# Patient Record
Sex: Male | Born: 1945 | Hispanic: No | Marital: Married | Smoking: Former smoker
Health system: Southern US, Community
[De-identification: ages and names within clinical notes are randomized; demographics above are authoritative.]

## PROBLEM LIST (undated history)

## (undated) DIAGNOSIS — I10 Essential (primary) hypertension: Secondary | ICD-10-CM

## (undated) DIAGNOSIS — E785 Hyperlipidemia, unspecified: Secondary | ICD-10-CM

## (undated) DIAGNOSIS — L940 Localized scleroderma [morphea]: Secondary | ICD-10-CM

## (undated) DIAGNOSIS — N289 Disorder of kidney and ureter, unspecified: Secondary | ICD-10-CM

## (undated) DIAGNOSIS — I739 Peripheral vascular disease, unspecified: Secondary | ICD-10-CM

## (undated) HISTORY — DX: Hyperlipidemia, unspecified: E78.5

---

## 2010-05-23 DIAGNOSIS — K743 Primary biliary cirrhosis: Secondary | ICD-10-CM

## 2010-05-23 HISTORY — DX: Primary biliary cirrhosis: K74.3

## 2017-06-16 ENCOUNTER — Ambulatory Visit: Payer: Self-pay | Admitting: Nurse Practitioner

## 2017-06-16 ENCOUNTER — Encounter: Payer: Self-pay | Admitting: Nurse Practitioner

## 2017-06-16 ENCOUNTER — Other Ambulatory Visit: Payer: Self-pay

## 2017-06-16 ENCOUNTER — Ambulatory Visit (INDEPENDENT_AMBULATORY_CARE_PROVIDER_SITE_OTHER): Payer: Medicare Other | Admitting: Nurse Practitioner

## 2017-06-16 VITALS — BP 136/62 | HR 66 | Temp 98.4°F | Wt 131.0 lb

## 2017-06-16 DIAGNOSIS — J410 Simple chronic bronchitis: Secondary | ICD-10-CM

## 2017-06-16 DIAGNOSIS — Z23 Encounter for immunization: Secondary | ICD-10-CM | POA: Diagnosis not present

## 2017-06-16 DIAGNOSIS — Z79899 Other long term (current) drug therapy: Secondary | ICD-10-CM

## 2017-06-16 DIAGNOSIS — Z7689 Persons encountering health services in other specified circumstances: Secondary | ICD-10-CM | POA: Diagnosis not present

## 2017-06-16 MED ORDER — ZOSTER VAC RECOMB ADJUVANTED 50 MCG/0.5ML IM SUSR: 0.5000 mL | Freq: Once | INTRAMUSCULAR | 1 refills | Status: AC

## 2017-06-16 NOTE — Assessment & Plan Note (Signed)
Pt with approx 1 year history of early morning productive cough with white sputum lasting about 1 hour.  With history of smoking, is suggestive of smoker's cough and early chronic bronchitis.  Pt with decreased air movement, but no other adventitious breath sounds today.  No respiratory distress or increased shortness of breath on exertion.  Plan: 1. Discussed inhalers, pt declines today. 2. Encouraged smoking cessation.  Pt declines additional information. 3. Followup with spirometry as needed for worsening or persistent symptoms. 4. Followup 6 months.

## 2017-06-16 NOTE — Progress Notes (Signed)
Subjective:    Patient ID: Scott Oconnor, male    DOB: [birth year], 72 y.o.   MRN: [id number]  Scott Oconnor is a 72 y.o. male presenting on 06/16/2017 for Establish Care ( coughing of phelgm mostly in morning x 1 year)   HPI Establish Care New Provider Pt last seen by PCP Dr. Phineas Douglas about 4 years ago.  Obtain records from [facility], [place], [place]  Chronic Cough Pt reports chronic productive cough with "lots of phlegm" every morning with coughing for about 1 hour.  This has occurred daily over last year and is not getting worse.  Sometimes has coughing in the evening and overnight. Production is white in color. - Pt reports no regular seasonal or environmental allergies.  He had previously lived in [place] and moved to [place] about 5-6 months ago.  Cough is unchanged with this move and his changed environmental exposures. - Pt started smoking at age [age] and smoked 1 ppd during working years until he started cutting back 2-3 years ago. He now smokes only 6 cigarettes per day and is not willing to completely quit smoking.  High risk medication: Pt reports his last visit with his doctor for labs was over 4 years ago.  He has had continued prescription of simvastatin without monitoring.  Pt has significant family history of heart disease and hyperlipidemia.  He reports he has never been diagnosed with hyperlipidemia, but takes this medication to prevent future heart attacks given family history.  Vaccinations: Pt requests shingles and pneumonia vaccines today.  Diet includes mostly meals of rice and curries (vegetables and small amounts of meat). Pt is regularly active with walking about 3 days per week.  Notes it was more frequently when he lived in [place] where it was warmer.  Past Medical History:  Diagnosis Date  . Hyperlipidemia    History reviewed. No pertinent surgical history. Social History   Socioeconomic History  . Marital status: Married    Spouse name: Not  on file  . Number of children: Not on file  . Years of education: Not on file  . Highest education level: Not on file  Social Needs  . Financial resource strain: Not on file  . Food insecurity - worry: Not on file  . Food insecurity - inability: Not on file  . Transportation needs - medical: Not on file  . Transportation needs - non-medical: Not on file  Occupational History  . Not on file  Tobacco Use  . Smoking status: Current Every Day Smoker    Packs/day: 0.33    Types: Cigarettes  . Smokeless tobacco: Never Used  Substance and Sexual Activity  . Alcohol use: No    Frequency: Never  . Drug use: No  . Sexual activity: Yes  Other Topics Concern  . Not on file  Social History Narrative  . Not on file   Family History  Problem Relation Age of Onset  . Heart disease Father   . Sudden Cardiac Death Father   . Heart disease Brother   . Hyperlipidemia Brother   . Heart disease Sister    Current Outpatient Medications on File Prior to Visit  Medication Sig  . aspirin EC 81 MG tablet Take 81 mg by mouth daily.  . simvastatin (ZOCOR) 10 MG tablet Take 10 mg by mouth daily.   No current facility-administered medications on file prior to visit.     Review of Systems  Constitutional: Negative.   HENT: Negative.  Eyes: Negative.   Respiratory: Positive for cough.   Cardiovascular:       Pt notes cold toes on R foot  Gastrointestinal: Negative.   Endocrine: Negative.   Genitourinary: Negative.   Musculoskeletal: Negative.   Skin: Negative.   Allergic/Immunologic: Negative.   Neurological: Negative.   Hematological: Negative.   Psychiatric/Behavioral: Negative.    Per HPI unless specifically indicated above     Objective:    BP 136/62 (BP Location: Right Arm, Patient Position: Sitting, Cuff Size: Normal)   Pulse 66   Temp 98.4 F (36.9 C) (Oral)   Wt 131 lb (59.4 kg)   Wt Readings from Last 3 Encounters:  06/16/17 131 lb (59.4 kg)    Physical Exam    Constitutional: He is oriented to person, place, and time. He appears well-developed and well-nourished. No distress.  HENT:  Head: Normocephalic and atraumatic.  Right Ear: Tympanic membrane, external ear and ear canal normal.  Left Ear: Tympanic membrane, external ear and ear canal normal.  Nose: Nose normal. Right sinus exhibits no maxillary sinus tenderness and no frontal sinus tenderness. Left sinus exhibits no maxillary sinus tenderness and no frontal sinus tenderness.  Mouth/Throat: Oropharynx is clear and moist.  Eyes: Conjunctivae and EOM are normal. Pupils are equal, round, and reactive to light.  Neck: Normal range of motion. Neck supple. No JVD present. Carotid bruit is not present. No tracheal deviation present. No thyromegaly present.  Cardiovascular: Normal rate, regular rhythm, S1 normal, S2 normal and normal heart sounds.  Pulses:      Radial pulses are 2+ on the right side, and 2+ on the left side.       Dorsalis pedis pulses are 0 on the right side, and 1+ on the left side.       Posterior tibial pulses are 1+ on the right side, and 1+ on the left side.  Toes on R foot are cool to touch, cap refill 3 seconds  Pulmonary/Chest: Effort normal. No respiratory distress. He has decreased breath sounds. He has no wheezes. He has no rhonchi. He has no rales.  Abdominal: Soft. Bowel sounds are normal.  Lymphadenopathy:    He has no cervical adenopathy.  Neurological: He is alert and oriented to person, place, and time.  Skin: Skin is warm and dry.  Psychiatric: He has a normal mood and affect. His behavior is normal. Judgment and thought content normal.  Vitals reviewed.    No results found for this or any previous visit.    Assessment & Plan:   Problem List Items Addressed This Visit      Respiratory   Smokers' cough ([place]) - Primary    Pt with approx 1 year history of early morning productive cough with white sputum lasting about 1 hour.  With history of smoking, is  suggestive of smoker's cough and early chronic bronchitis.  Pt with decreased air movement, but no other adventitious breath sounds today.  No respiratory distress or increased shortness of breath on exertion.  Plan: 1. Discussed inhalers, pt declines today. 2. Encouraged smoking cessation.  Pt declines additional information. 3. Followup with spirometry as needed for worsening or persistent symptoms. 4. Followup 6 months.       Other Visit Diagnoses    Long-term use of high-risk medication     Pt taking simvastatin 10 mg once daily without reassessment of labs.  Pt tolerating the medication well.  Plan: 1. CMP and lipid panel. 2. Continue medications for ASCVD prevention  given family and smoking histories if labs return WNL or require additional lipid management. 3. Followup at least once every 6 months.   Relevant Orders   Comprehensive metabolic panel   Lipid panel   Need for shingles vaccine     Pt requests vaccine.  Order placed for pharmacy administration of Shingrix 2-dose series.   Relevant Medications   Zoster Vaccine Adjuvanted [facility]) injection   Need for vaccination against Streptococcus pneumoniae using pneumococcal conjugate vaccine 13     Pt requests pneumonia vaccination.  None previously administered.  Give Prevnar 13.  Pt declines today, but agrees to have vaccine administered when he returns for labs next Tuesday.    Encounter to establish care     Previous PCP was in [place], [place].  Records will not be requested as care was > 4 years ago.  Past medical, family, and surgical history reviewed w/ pt in clinic today.      Meds ordered this encounter  Medications  . Zoster Vaccine Adjuvanted [facility]) injection    Sig: Inject 0.5 mLs into the muscle once for 1 dose. Repeat dose in 2-6 months.    Dispense:  0.5 mL    Refill:  1    Order Specific Question:   Supervising Provider    Answer:   Olin Hauser [[id number]]    Follow up plan: Return in  about 2 weeks (around 06/30/2017) for Medicare Wellness with [place] 6 months for hyperlipidemia.  Cassell Smiles, DNP, AGPCNP-BC Adult Gerontology Primary Care Nurse Practitioner [facility] 06/16/2017, 4:49 PM

## 2017-06-16 NOTE — Patient Instructions (Addendum)
Hussam, Thank you for coming in to clinic today.  1. Your cough is most likely early chronic bronchitis or COPD.  If this worsens, let me know and we can start medications.  2.  Continue or simvastatin and aspirin 81 mg once daily  3. You will be due for [place].  This means you should eat no food or drink after midnight.  Drink only water or coffee without cream/sugar on the morning of your lab visit. - Please go ahead and schedule a "Lab Only" visit in the morning at the clinic for lab draw in the next 7 days. - Your results will be available about 2-3 days after blood draw.  If you have set up a MyChart account, you can can log in to MyChart online to view your results and a brief explanation. Also, we can discuss your results together at your next office visit if you would like.  4. You should get Prevnar 13 pneumonia vaccine the same day you return for labs.  Please schedule a follow-up appointment with Cassell Smiles, AGNP. Return in about 2 weeks (around 06/30/2017) for Medicare Wellness with [facility] 6 months for hyperlipidemia.  If you have any other questions or concerns, please feel free to call the clinic or send a message through [place]. You may also schedule an earlier appointment if necessary.  You will receive a survey after today's visit either digitally by e-mail or paper by [employer]. Your experiences and feedback matter to [place].  Please respond so we know how we are doing as we provide care for you.   Cassell Smiles, DNP, AGNP-BC Adult Gerontology Nurse Practitioner [place]

## 2017-06-23 LAB — COMPREHENSIVE METABOLIC PANEL
AG Ratio: 1.4 (calc) (ref 1.0–2.5)
ALT: 14 U/L (ref 9–46)
AST: 16 U/L (ref 10–35)
Albumin: 3.9 g/dL (ref 3.6–5.1)
Alkaline phosphatase (APISO): 86 U/L (ref 40–115)
BUN/Creatinine Ratio: 17 (calc) (ref 6–22)
BUN: 21 mg/dL (ref 7–25)
CO2: 30 mmol/L (ref 20–32)
Calcium: 9 mg/dL (ref 8.6–10.3)
Chloride: 104 mmol/L (ref 98–110)
Creat: 1.27 mg/dL — ABNORMAL HIGH (ref 0.70–1.18)
Globulin: 2.7 g/dL (calc) (ref 1.9–3.7)
Glucose, Bld: 119 mg/dL — ABNORMAL HIGH (ref 65–99)
Potassium: 3.7 mmol/L (ref 3.5–5.3)
Sodium: 140 mmol/L (ref 135–146)
Total Bilirubin: 0.5 mg/dL (ref 0.2–1.2)
Total Protein: 6.6 g/dL (ref 6.1–8.1)

## 2017-06-23 LAB — LIPID PANEL
Cholesterol: 136 mg/dL (ref <200)
HDL: 49 mg/dL (ref >40)
LDL Cholesterol (Calc): 70 mg/dL (calc)
Non-HDL Cholesterol (Calc): 87 mg/dL (calc) (ref <130)
Total CHOL/HDL Ratio: 2.8 (calc) (ref <5.0)
Triglycerides: 86 mg/dL (ref <150)

## 2017-06-27 ENCOUNTER — Ambulatory Visit (INDEPENDENT_AMBULATORY_CARE_PROVIDER_SITE_OTHER): Payer: Medicare Other

## 2017-06-27 VITALS — BP 140/80 | HR 62 | Temp 98.1°F | Resp 16 | Ht 70.0 in | Wt 130.0 lb

## 2017-06-27 DIAGNOSIS — Z23 Encounter for immunization: Secondary | ICD-10-CM

## 2017-06-27 DIAGNOSIS — Z0000 Encounter for general adult medical examination without abnormal findings: Secondary | ICD-10-CM

## 2017-06-27 NOTE — Progress Notes (Signed)
Subjective:   Scott Oconnor is a 72 y.o. male who presents for an Initial Medicare Annual Wellness Visit.  Review of Systems   Cardiac Risk Factors include: advanced age (>60men, >38 women);male gender;smoking/ tobacco exposure    Objective:    Today's Vitals   06/27/17 1027  BP: 140/80  Pulse: 62  Resp: 16  Temp: 98.1 F (36.7 C)  TempSrc: Oral  Weight: 130 lb (59 kg)  Height: 5\' 10"  (1.778 m)   Body mass index is 18.65 kg/m.  Advanced Directives 06/27/2017  Does Patient Have a Medical Advance Directive? No  Would patient like information on creating a medical advance directive? Yes (MAU/Ambulatory/Procedural Areas - Information given)    Current Medications (verified) Outpatient Encounter Medications as of 06/27/2017  Medication Sig  . aspirin EC 81 MG tablet Take 81 mg by mouth daily.  . Multiple Vitamin (MULTIVITAMIN) capsule Take 1 capsule by mouth daily.  . simvastatin (ZOCOR) 10 MG tablet Take 10 mg by mouth daily.   No facility-administered encounter medications on file as of 06/27/2017.     Allergies (verified) Patient has no known allergies.   History: Past Medical History:  Diagnosis Date  . Hyperlipidemia    History reviewed. No pertinent surgical history. Family History  Problem Relation Age of Onset  . Heart disease Father   . Sudden Cardiac Death Father   . Heart disease Brother   . Hyperlipidemia Brother   . Heart disease Sister   . Heart disease Brother   . Hyperlipidemia Brother   . Hyperlipidemia Brother   . Hyperlipidemia Brother   . Hyperlipidemia Brother   . Hyperlipidemia Brother   . Heart disease Sister    Social History   Socioeconomic History  . Marital status: Married    Spouse name: None  . Number of children: None  . Years of education: None  . Highest education level: None  Social Needs  . Financial resource strain: Not hard at all  . Food insecurity - worry: Never true  . Food insecurity - inability: Never true   . Transportation needs - medical: No  . Transportation needs - non-medical: No  Occupational History  . None  Tobacco Use  . Smoking status: Current Every Day Smoker    Packs/day: 0.33    Types: Cigarettes  . Smokeless tobacco: Never Used  . Tobacco comment: 6 cigarettes daily   Substance and Sexual Activity  . Alcohol use: No    Frequency: Never  . Drug use: No  . Sexual activity: Yes  Other Topics Concern  . None  Social History Narrative  . None   Tobacco Counseling Ready to quit: No Counseling given: Yes Comment: 6 cigarettes daily    Clinical Intake:  Pre-visit preparation completed: Yes  Pain : No/denies pain     Nutritional Status: BMI <19  Underweight Nutritional Risks: None Diabetes: No  How often do you need to have someone help you when you read instructions, pamphlets, or other written materials from your doctor or pharmacy?: 1 - Never What is the last grade level you completed in school?: high school   Interpreter Needed?: No  Information entered by :: Tiffany Hill,LPN  Activities of Daily Living In your present state of health, do you have any difficulty performing the following activities: 06/27/2017 06/16/2017  Hearing? N N  Vision? N Y  Difficulty concentrating or making decisions? N N  Walking or climbing stairs? N N  Dressing or bathing? N N  Doing  errands, shopping? N N  Preparing Food and eating ? N -  Using the Toilet? N -  In the past six months, have you accidently leaked urine? N -  Do you have problems with loss of bowel control? N -  Managing your Medications? N -  Managing your Finances? N -  Housekeeping or managing your Housekeeping? N -     Immunizations and Health Maintenance Immunization History  Administered Date(s) Administered  . Pneumococcal Conjugate-13 06/27/2017   Health Maintenance Due  Topic Date Due  . TETANUS/TDAP  09/01/1965  . COLONOSCOPY  09/01/1996  . INFLUENZA VACCINE  12/21/2016    Patient Care  Team: Mikey College, NP as PCP - General (Nurse Practitioner)  Indicate any recent Medical Services you may have received from other than [facility] providers in the past year (date may be approximate).    Assessment:   This is a routine wellness examination for Scott Oconnor.  Hearing/Vision screen Vision Screening Comments: Doesn't have an eye doctor in the area.   Dietary issues and exercise activities discussed: Current Exercise Habits: The patient does not participate in regular exercise at present, Exercise limited by: None identified  Goals    . Quit Smoking     Smoking cessation discussed      Depression Screen PHQ 2/9 Scores 06/27/2017 06/16/2017  PHQ - 2 Score 0 0    Fall Risk Fall Risk  06/27/2017 06/16/2017  Falls in the past year? No No    Is the patient's home free of loose throw rugs in walkways, pet beds, electrical cords, etc?   yes      Grab bars in the bathroom? no      Handrails on the stairs?   no      Adequate lighting?   no  Timed Get Up and Go performed: Completed in 9 seconds with no use of assistive devices, steady gait. No intervention needed at this time.   Cognitive Function:     6CIT Screen 06/27/2017  What Year? 0 points  What month? 0 points  What time? 0 points  Count back from 20 0 points  Months in reverse 0 points  Repeat phrase 2 points  Total Score 2    Screening Tests Health Maintenance  Topic Date Due  . TETANUS/TDAP  09/01/1965  . COLONOSCOPY  09/01/1996  . INFLUENZA VACCINE  12/21/2016  . Hepatitis C Screening  06/27/2018 (Originally December 16, 1945)  . PNA vac Low Risk Adult (2 of 2 - PPSV23) 06/27/2018    Qualifies for Shingles Vaccine? Discussed shingrix vaccine   Cancer Screenings: Lung: Low Dose CT Chest recommended if Age 18-80 years, 30 pack-year currently smoking OR have quit w/in 15years. Patient does not qualify. Colorectal: patient has cologuard information, he will call if he decides to have colonoscopy or cologuard  done  Additional Screenings:  Hepatitis B/HIV/Syphillis: not indicated Hepatitis C Screening: not indicated      Plan:    I have personally reviewed and addressed the Medicare Annual Wellness questionnaire and have noted the following in the patient's chart:  A. Medical and social history B. Use of alcohol, tobacco or illicit drugs  C. Current medications and supplements D. Functional ability and status E.  Nutritional status F.  Physical activity G. Advance directives H. List of other physicians I.  Hospitalizations, surgeries, and ER visits in previous 12 months J.  [place] such as hearing and vision if needed, cognitive and depression L. Referrals and appointments  In addition, I have reviewed and discussed with patient certain preventive protocols, quality metrics, and best practice recommendations. A written personalized care plan for preventive services as well as general preventive health recommendations were provided to patient.   Signed,  Tyler Aas, LPN Nurse Health Advisor   Nurse Notes:none

## 2017-06-27 NOTE — Patient Instructions (Addendum)
Scott Oconnor , Thank you for taking time to come for your Medicare Wellness Visit. I appreciate your ongoing commitment to your health goals. Please review the following plan we discussed and let me know if I can assist you in the future.   Screening recommendations/referrals: Colonoscopy: declined at this time. cologuard information given.  Recommended yearly ophthalmology/optometry visit for glaucoma screening and checkup Recommended yearly dental visit for hygiene and checkup  Vaccinations: Influenza vaccine: declined today  Pneumococcal vaccine: prevnar 13 done today, pneumovax 23 due 06/27/2018 Tdap vaccine: due, check with your insurance company for coverage  Shingles vaccine: due, check with your insurance for coverage. Available at pharmacy    Advanced directives: Advance directive discussed with you today. I have provided a copy for you to complete at home and have notarized. Once this is complete please bring a copy in to our office so we can scan it into your chart.  Conditions/risks identified: smoking cessation discussed  Next appointment: Follow up on 11/15/2017 at 11:00am with Lissa Merlin. Follow up in one year for your annual wellness exam.  Preventive Care 65 Years and Older, Male Preventive care refers to lifestyle choices and visits with your health care provider that can promote health and wellness. What does preventive care include?  A yearly physical exam. This is also called an annual well check.  Dental exams once or twice a year.  Routine eye exams. Ask your health care provider how often you should have your eyes checked.  Personal lifestyle choices, including:  Daily care of your teeth and gums.  Regular physical activity.  Eating a healthy diet.  Avoiding tobacco and drug use.  Limiting alcohol use.  Practicing safe sex.  Taking low doses of aspirin every day.  Taking vitamin and mineral supplements as recommended by your health care  provider. What happens during an annual well check? The services and screenings done by your health care provider during your annual well check will depend on your age, overall health, lifestyle risk factors, and family history of disease. Counseling  Your health care provider may ask you questions about your:  Alcohol use.  Tobacco use.  Drug use.  Emotional well-being.  Home and relationship well-being.  Sexual activity.  Eating habits.  History of falls.  Memory and ability to understand (cognition).  Work and work Statistician. Screening  You may have the following tests or measurements:  Height, weight, and BMI.  Blood pressure.  Lipid and cholesterol levels. These may be checked every 5 years, or more frequently if you are over 60 years old.  Skin check.  Lung cancer screening. You may have this screening every year starting at age 70 if you have a 30-pack-year history of smoking and currently smoke or have quit within the past 15 years.  Fecal occult blood test (FOBT) of the stool. You may have this test every year starting at age 35.  Flexible sigmoidoscopy or colonoscopy. You may have a sigmoidoscopy every 5 years or a colonoscopy every 10 years starting at age 31.  Prostate cancer screening. Recommendations will vary depending on your family history and other risks.  Hepatitis C blood test.  Hepatitis B blood test.  Sexually transmitted disease (STD) testing.  Diabetes screening. This is done by checking your blood sugar (glucose) after you have not eaten for a while (fasting). You may have this done every 1-3 years.  Abdominal aortic aneurysm (AAA) screening. You may need this if you are a current or former smoker.  Osteoporosis. You may be screened starting at age 15 if you are at high risk. Talk with your health care provider about your test results, treatment options, and if necessary, the need for more tests. Vaccines  Your health care provider  may recommend certain vaccines, such as:  Influenza vaccine. This is recommended every year.  Tetanus, diphtheria, and acellular pertussis (Tdap, Td) vaccine. You may need a Td booster every 10 years.  Zoster vaccine. You may need this after age 76.  Pneumococcal 13-valent conjugate (PCV13) vaccine. One dose is recommended after age 39.  Pneumococcal polysaccharide (PPSV23) vaccine. One dose is recommended after age 66. Talk to your health care provider about which screenings and vaccines you need and how often you need them. This information is not intended to replace advice given to you by your health care provider. Make sure you discuss any questions you have with your health care provider. Document Released: 06/05/2015 Document Revised: 01/27/2016 Document Reviewed: 03/10/2015 Elsevier Interactive Patient Education  2017 [place] Prevention in the Home Falls can cause injuries. They can happen to people of all ages. There are many things you can do to make your home safe and to help prevent falls. What can I do on the outside of my home?  Regularly fix the edges of walkways and driveways and fix any cracks.  Remove anything that might make you trip as you walk through a door, such as a raised step or threshold.  Trim any bushes or trees on the path to your home.  Use bright outdoor lighting.  Clear any walking paths of anything that might make someone trip, such as rocks or tools.  Regularly check to see if handrails are loose or broken. Make sure that both sides of any steps have handrails.  Any raised decks and porches should have guardrails on the edges.  Have any leaves, snow, or ice cleared regularly.  Use sand or salt on walking paths during winter.  Clean up any spills in your garage right away. This includes oil or grease spills. What can I do in the bathroom?  Use night lights.  Install grab bars by the toilet and in the tub and shower. Do not use  towel bars as grab bars.  Use non-skid mats or decals in the tub or shower.  If you need to sit down in the shower, use a plastic, non-slip stool.  Keep the floor dry. Clean up any water that spills on the floor as soon as it happens.  Remove soap buildup in the tub or shower regularly.  Attach bath mats securely with double-sided non-slip rug tape.  Do not have throw rugs and other things on the floor that can make you trip. What can I do in the bedroom?  Use night lights.  Make sure that you have a light by your bed that is easy to reach.  Do not use any sheets or blankets that are too big for your bed. They should not hang down onto the floor.  Have a firm chair that has side arms. You can use this for support while you get dressed.  Do not have throw rugs and other things on the floor that can make you trip. What can I do in the kitchen?  Clean up any spills right away.  Avoid walking on wet floors.  Keep items that you use a lot in easy-to-reach places.  If you need to reach something above you, use a strong step  stool that has a grab bar.  Keep electrical cords out of the way.  Do not use floor polish or wax that makes floors slippery. If you must use wax, use non-skid floor wax.  Do not have throw rugs and other things on the floor that can make you trip. What can I do with my stairs?  Do not leave any items on the stairs.  Make sure that there are handrails on both sides of the stairs and use them. Fix handrails that are broken or loose. Make sure that handrails are as long as the stairways.  Check any carpeting to make sure that it is firmly attached to the stairs. Fix any carpet that is loose or worn.  Avoid having throw rugs at the top or bottom of the stairs. If you do have throw rugs, attach them to the floor with carpet tape.  Make sure that you have a light switch at the top of the stairs and the bottom of the stairs. If you do not have them, ask  someone to add them for you. What else can I do to help prevent falls?  Wear shoes that:  Do not have high heels.  Have rubber bottoms.  Are comfortable and fit you well.  Are closed at the toe. Do not wear sandals.  If you use a stepladder:  Make sure that it is fully opened. Do not climb a closed stepladder.  Make sure that both sides of the stepladder are locked into place.  Ask someone to hold it for you, if possible.  Clearly mark and make sure that you can see:  Any grab bars or handrails.  First and last steps.  Where the edge of each step is.  Use tools that help you move around (mobility aids) if they are needed. These include:  Canes.  Walkers.  Scooters.  Crutches.  Turn on the lights when you go into a dark area. Replace any light bulbs as soon as they burn out.  Set up your furniture so you have a clear path. Avoid moving your furniture around.  If any of your floors are uneven, fix them.  If there are any pets around you, be aware of where they are.  Review your medicines with your doctor. Some medicines can make you feel dizzy. This can increase your chance of falling. Ask your doctor what other things that you can do to help prevent falls. This information is not intended to replace advice given to you by your health care provider. Make sure you discuss any questions you have with your health care provider. Document Released: 03/05/2009 Document Revised: 10/15/2015 Document Reviewed: 06/13/2014 Elsevier Interactive Patient Education  2017 [place] provider would like to you have your annual eye exam. Please contact your current eye doctor or here are some good options for you to contact.   [facility] Address: [address] [place], [place] [address]   Address: [address] [place], [place] [address]  Phone: [phone]      Phone: [phone]  Website: [email or web address]    Website:  [email or web address]     [facility]  Address: [address] [place], [place], [place] [address]   Address: [place], [place],  [address] Phone: [phone]      Phone: [phone]    [facility] Address:  [address] [place], [place] [address]  Phone: [phone]   Pneumococcal Conjugate Vaccine (PCV13) What You Need to Know 1. Why get vaccinated? Vaccination can protect both children and adults from pneumococcal disease. Pneumococcal disease is caused by bacteria that can spread from person to person through close contact. It can cause ear infections, and it can also lead to more serious infections of the:  Lungs (pneumonia),  Blood (bacteremia), and  Covering of the brain and spinal cord (meningitis).  Pneumococcal pneumonia is most common among adults. Pneumococcal meningitis can cause deafness and brain damage, and it kills about 1 child in 10 who get it. Anyone can get pneumococcal disease, but children under 30 years of age and adults 46 years and older, people with certain medical conditions, and cigarette smokers are at the highest risk. Before there was a vaccine, the [place] saw:  more than 700 cases of meningitis,  about 13,000 blood infections,  about 5 million ear infections, and  about 200 deaths  in children under 5 each year from pneumococcal disease. Since vaccine became available, severe pneumococcal disease in these children has fallen by 88%. About 18,000 older adults die of pneumococcal disease each year in the [place]. Treatment of pneumococcal infections with penicillin and other drugs is not as effective as it used to be, because some strains of the disease have become resistant to these drugs. This makes prevention of the disease, through vaccination, even more important. 2. PCV13 vaccine Pneumococcal conjugate vaccine (called PCV13) protects against 13 types of pneumococcal bacteria. PCV13  is routinely given to children at 2, 4, 6, and 24-75 months of age. It is also recommended for children and adults 36 to 58 years of age with certain health conditions, and for all adults 66 years of age and older. Your doctor can give you details. 3. Some people should not get this vaccine Anyone who has ever had a life-threatening allergic reaction to a dose of this vaccine, to an earlier pneumococcal vaccine called PCV7, or to any vaccine containing diphtheria toxoid (for example, DTaP), should not get PCV13. Anyone with a severe allergy to any component of PCV13 should not get the vaccine. Tell your doctor if the person being vaccinated has any severe allergies. If the person scheduled for vaccination is not feeling well, your healthcare provider might decide to reschedule the shot on another day. 4. Risks of a vaccine reaction With any medicine, including vaccines, there is a chance of reactions. These are usually mild and go away on their own, but serious reactions are also possible. Problems reported following PCV13 varied by age and dose in the series. The most common problems reported among children were:  About half became drowsy after the shot, had a temporary loss of appetite, or had redness or tenderness where the shot was given.  About 1 out of 3 had swelling where the shot was given.  About 1 out of 3 had a mild fever, and about 1 in 20 had a fever over 102.53F.  Up to about 8 out of 10 became fussy or irritable.  Adults have reported pain, redness, and swelling where the shot was given; also mild fever, fatigue, headache, chills, or muscle pain. Young children who get PCV13 along with inactivated flu vaccine at the same time may be at increased risk for seizures caused by fever. Ask your doctor for more information. Problems that could happen after any vaccine:  People sometimes faint after a medical procedure,  including vaccination. Sitting or lying down for about 15 minutes can  help prevent fainting, and injuries caused by a fall. Tell your doctor if you feel dizzy, or have vision changes or ringing in the ears.  Some older children and adults get severe pain in the shoulder and have difficulty moving the arm where a shot was given. This happens very rarely.  Any medication can cause a severe allergic reaction. Such reactions from a vaccine are very rare, estimated at about 1 in a million doses, and would happen within a few minutes to a few hours after the vaccination. As with any medicine, there is a very small chance of a vaccine causing a serious injury or death. The safety of vaccines is always being monitored. For more information, visit: http://www.aguilar.org/ 5. What if there is a serious reaction? What should I look for? Look for anything that concerns you, such as signs of a severe allergic reaction, very high fever, or unusual behavior. Signs of a severe allergic reaction can include hives, swelling of the face and throat, difficulty breathing, a fast heartbeat, dizziness, and weakness-usually within a few minutes to a few hours after the vaccination. What should I do?  If you think it is a severe allergic reaction or other emergency that can't wait, call 9-1-1 or get the person to the nearest hospital. Otherwise, call your doctor.  Reactions should be reported to the Vaccine Adverse Event Reporting System (VAERS). Your doctor should file this report, or you can do it yourself through the VAERS web site at www.vaers.SamedayNews.es, or by calling 206-339-5113. ? VAERS does not give medical advice. 6. The National Vaccine Injury Compensation Program The [employer] Vaccine Injury Compensation Program (VICP) is a federal program that was created to compensate people who may have been injured by certain vaccines. Persons who believe they may have been injured by a vaccine can learn about the program and about filing a claim by calling (706) 587-6297 or visiting the  [place] website at GoldCloset.com.ee. There is a time limit to file a claim for compensation. 7. How can I learn more?  Ask your healthcare provider. He or she can give you the vaccine package insert or suggest other sources of information.  Call your local or state health department.  Contact the Centers for Disease Control and Prevention (CDC): ? Call (606) 221-6142 (1-800-CDC-INFO) or ? Visit CDC's website at http://hunter.com/ Vaccine Information Statement, PCV13 Vaccine (03/27/2014) This information is not intended to replace advice given to you by your health care provider. Make sure you discuss any questions you have with your health care provider. Document Released: 03/06/2006 Document Revised: 01/28/2016 Document Reviewed: 01/28/2016 Elsevier Interactive Patient Education  2017 [employer].

## 2017-07-26 ENCOUNTER — Ambulatory Visit: Payer: Self-pay | Admitting: Nurse Practitioner

## 2017-08-01 ENCOUNTER — Emergency Department
Admission: EM | Admit: 2017-08-01 | Discharge: 2017-08-01 | Disposition: A | Discharge status: Home / Self Care | Payer: Medicare Other | Attending: Emergency Medicine | Admitting: Emergency Medicine

## 2017-08-01 ENCOUNTER — Encounter: Payer: Self-pay | Admitting: Emergency Medicine

## 2017-08-01 ENCOUNTER — Other Ambulatory Visit: Payer: Self-pay

## 2017-08-01 DIAGNOSIS — W260XXA Contact with knife, initial encounter: Secondary | ICD-10-CM | POA: Insufficient documentation

## 2017-08-01 DIAGNOSIS — Y999 Unspecified external cause status: Secondary | ICD-10-CM | POA: Insufficient documentation

## 2017-08-01 DIAGNOSIS — Z7982 Long term (current) use of aspirin: Secondary | ICD-10-CM | POA: Insufficient documentation

## 2017-08-01 DIAGNOSIS — Z23 Encounter for immunization: Secondary | ICD-10-CM | POA: Diagnosis not present

## 2017-08-01 DIAGNOSIS — Y929 Unspecified place or not applicable: Secondary | ICD-10-CM | POA: Diagnosis not present

## 2017-08-01 DIAGNOSIS — B351 Tinea unguium: Secondary | ICD-10-CM | POA: Insufficient documentation

## 2017-08-01 DIAGNOSIS — S61012A Laceration without foreign body of left thumb without damage to nail, initial encounter: Secondary | ICD-10-CM | POA: Diagnosis present

## 2017-08-01 DIAGNOSIS — F17210 Nicotine dependence, cigarettes, uncomplicated: Secondary | ICD-10-CM | POA: Diagnosis not present

## 2017-08-01 DIAGNOSIS — M79676 Pain in unspecified toe(s): Secondary | ICD-10-CM | POA: Insufficient documentation

## 2017-08-01 DIAGNOSIS — Y93G1 Activity, food preparation and clean up: Secondary | ICD-10-CM | POA: Diagnosis not present

## 2017-08-01 MED ORDER — TETANUS-DIPHTH-ACELL PERTUSSIS 5-2.5-18.5 LF-MCG/0.5 IM SUSP
INTRAMUSCULAR | Status: AC
  Filled 2017-08-01: qty 0.5

## 2017-08-01 MED ORDER — TETANUS-DIPHTH-ACELL PERTUSSIS 5-2.5-18.5 LF-MCG/0.5 IM SUSP
0.5000 mL | Freq: Once | INTRAMUSCULAR | Status: AC
  Administered 2017-08-01: 0.5 mL via INTRAMUSCULAR

## 2017-08-01 MED ORDER — LIDOCAINE HCL (PF) 1 % IJ SOLN
5.0000 mL | Freq: Once | INTRAMUSCULAR | Status: AC
Start: 2017-08-01 — End: 2017-08-01
  Administered 2017-08-01: 5 mL
  Filled 2017-08-01: qty 5

## 2017-08-01 NOTE — Discharge Instructions (Signed)
Keep the wound clean, dry, and covered. Follow-up with a local urgent care center for suture removal in 10-12 days. See Dr. Vickki Muff for further management of the nail fungus and toe pain.

## 2017-08-01 NOTE — ED Provider Notes (Signed)
[facility] Emergency Department Provider Note ____________________________________________  Time seen: 2132  I have reviewed the triage vital signs and the nursing notes.  HISTORY  Chief Complaint  Laceration  HPI Scott Oconnor is a 72 y.o. male presents to the ED accompanied by his adult daughter, for treatment of a laceration to the left thumb. He was trying to cut frozen chicken cutlets apart. The knife slipped, cutting him on the palmar aspect of the proximal phalanx. He reports an out-of-date tetanus status.   He has an unrelated concern for evaluation of bilateral great toe pain. He denies any injury, trauma, or redness. The pain is worse with shoes and is localized to the free edge of the nail at the toes.   Past Medical History:  Diagnosis Date  . Hyperlipidemia     Patient Active Problem List   Diagnosis Date Noted  . Smokers' cough ([place]) 06/16/2017    History reviewed. No pertinent surgical history.  Prior to Admission medications   Medication Sig Start Date End Date Taking? Authorizing Provider  aspirin EC 81 MG tablet Take 81 mg by mouth daily.    [provider]  Multiple Vitamin (MULTIVITAMIN) capsule Take 1 capsule by mouth daily.    [provider]  simvastatin (ZOCOR) 10 MG tablet Take 10 mg by mouth daily.    [provider]    Allergies Patient has no known allergies.  Family History  Problem Relation Age of Onset  . Heart disease Father   . Sudden Cardiac Death Father   . Heart disease Brother   . Hyperlipidemia Brother   . Heart disease Sister   . Heart disease Brother   . Hyperlipidemia Brother   . Hyperlipidemia Brother   . Hyperlipidemia Brother   . Hyperlipidemia Brother   . Hyperlipidemia Brother   . Heart disease Sister     Social History Social History   Tobacco Use  . Smoking status: Current Every Day Smoker    Packs/day: 0.33    Types: Cigarettes  . Smokeless tobacco:  Never Used  . Tobacco comment: 6 cigarettes daily   Substance Use Topics  . Alcohol use: No    Frequency: Never  . Drug use: No    Review of Systems  Constitutional: Negative for fever. Cardiovascular: Negative for chest pain. Respiratory: Negative for shortness of breath. Musculoskeletal: Negative for back pain. Skin: Negative for rash. Left thumb laceration. Bilateral great toe pain.  Neurological: Negative for headaches, focal weakness or numbness. ____________________________________________  PHYSICAL EXAM:  VITAL SIGNS: ED Triage Vitals [08/01/17 2042]  Enc Vitals Group     BP 130/63     Pulse Rate (!) 59     Resp 18     Temp 97.8 F (36.6 C)     Temp Source Oral     SpO2 100 %     Weight 130 lb (59 kg)     Height 5\' 11"  (1.803 m)     Head Circumference      Peak Flow      Pain Score 8     Pain Loc      Pain Edu?      Excl. in [place]?     Constitutional: Alert and oriented. Well appearing and in no distress. Head: Normocephalic and atraumatic. Cardiovascular: Normal rate, regular rhythm. Normal distal pulses. Respiratory: Normal respiratory effort.  Musculoskeletal: Normal composite fist. Left thumb with a 2 cm linear laceration to the proximal phalanx. Nontender with normal range  of motion in all extremities.  Neurologic:  Normal gross sensation. No gross focal neurologic deficits are appreciated. Skin:  Skin is warm, dry and intact. No rash noted. Patient with onychomycosis of the bilateral great toenails. The right nail with onycholysis of the free edge. There is some local tenderness to the lateral edges of the distal nails, right > left toe. No paronychia or ingrown nail edges noted.  ____________________________________________  PROCEDURES Tdap 0.5 ml IM   .Marland KitchenLaceration Repair Date/Time: 08/01/2017 10:41 PM Performed by: Melvenia Needles, PA-C Authorized by: Melvenia Needles, PA-C   Consent:    Consent obtained:  Verbal   Consent given by:   Patient   Risks discussed:  Poor wound healing Anesthesia (see MAR for exact dosages):    Anesthesia method:  Local infiltration   Local anesthetic:  Lidocaine 1% w/o epi Laceration details:    Location:  Finger   Finger location:  L thumb   Length (cm):  2 Repair type:    Repair type:  Simple Pre-procedure details:    Preparation:  Patient was prepped and draped in usual sterile fashion Treatment:    Area cleansed with:  Betadine   Amount of cleaning:  Standard Skin repair:    Repair method:  Sutures   Suture size:  4-0   Suture material:  Nylon   Suture technique:  Simple interrupted   Number of sutures:  3 Approximation:    Approximation:  Close   V[place] border: well-aligned   Post-procedure details:    Dressing:  Non-adherent dressing   Patient tolerance of procedure:  Tolerated well, no immediate complications  ___________________________________________  INITIAL IMPRESSION / ASSESSMENT AND PLAN / ED COURSE  Patient with ED evaluation of an acute left thumb laceration. The wound is repaired with sutures. He will see a local urgent care for suture removal in 10-12 days. He is also referred to podiatry to further management of his onychomycosis.  ____________________________________________  FINAL CLINICAL IMPRESSION(S) / ED DIAGNOSES  Final diagnoses:  Laceration of left thumb without foreign body without damage to nail, initial encounter  Pain due to onychomycosis of toenail      Orvan Papadakis, Dannielle Karvonen, PA-C 08/01/17 2246    Harvest Dark, MD 08/02/17 0003

## 2017-08-01 NOTE — ED Notes (Addendum)
Pt has a laceration to the left hand. Laceration is proximal to the thumb on the outside of hand around 1" in length. Unable to assess depth at this time due to bandage. Pt has who butterfly stitches placed over the wound. daughter states the laceration seemed deep to her and created "a pocket". Wound is clean and currently not bleeding

## 2017-08-01 NOTE — ED Notes (Signed)
Pt also would like PA to look at the big toe on the right foot. Area appears red, pt has toe covered with Band-Aid due to irritation in sock/ shoe

## 2017-08-01 NOTE — ED Triage Notes (Signed)
Patient ambulatory to triage with steady gait, without difficulty or distress noted; pt reports while cutting chicken cut hand; small bandaid in place to base of left thumb; also reports while he is here he would like someone to check his right great toe; denies injury/swelling/redness but reports occasionally has intermittent pain

## 2017-08-11 ENCOUNTER — Emergency Department
Admission: EM | Admit: 2017-08-11 | Discharge: 2017-08-11 | Disposition: A | Discharge status: Home / Self Care | Payer: Medicare Other | Attending: Emergency Medicine | Admitting: Emergency Medicine

## 2017-08-11 ENCOUNTER — Encounter: Payer: Self-pay | Admitting: Emergency Medicine

## 2017-08-11 DIAGNOSIS — W260XXD Contact with knife, subsequent encounter: Secondary | ICD-10-CM | POA: Diagnosis not present

## 2017-08-11 DIAGNOSIS — Y93G1 Activity, food preparation and clean up: Secondary | ICD-10-CM | POA: Insufficient documentation

## 2017-08-11 DIAGNOSIS — F17210 Nicotine dependence, cigarettes, uncomplicated: Secondary | ICD-10-CM | POA: Diagnosis not present

## 2017-08-11 DIAGNOSIS — Z4802 Encounter for removal of sutures: Secondary | ICD-10-CM

## 2017-08-11 DIAGNOSIS — Y998 Other external cause status: Secondary | ICD-10-CM | POA: Diagnosis not present

## 2017-08-11 DIAGNOSIS — S61012D Laceration without foreign body of left thumb without damage to nail, subsequent encounter: Secondary | ICD-10-CM | POA: Insufficient documentation

## 2017-08-11 DIAGNOSIS — Y92000 Kitchen of unspecified non-institutional (private) residence as  the place of occurrence of the external cause: Secondary | ICD-10-CM | POA: Insufficient documentation

## 2017-08-11 NOTE — ED Provider Notes (Signed)
[facility] Emergency Department Provider Note  ____________________________________________   None    (approximate)  I have reviewed the triage vital signs and the nursing notes.   HISTORY  Chief Complaint Suture / Staple Removal   HPI Scott Oconnor is a 72 y.o. male's here for suture removal.  Patient was seen in the emergency room 10 days ago for a laceration to his left thumb that occurred while he was preparing chicken.  He was cut with a kitchen knife.  He denies any difficulties or problems presently.  Past Medical History:  Diagnosis Date  . Hyperlipidemia     Patient Active Problem List   Diagnosis Date Noted  . Smokers' cough ([place]) 06/16/2017    History reviewed. No pertinent surgical history.  Prior to Admission medications   Medication Sig Start Date End Date Taking? Authorizing Provider  aspirin EC 81 MG tablet Take 81 mg by mouth daily.    [provider]  Multiple Vitamin (MULTIVITAMIN) capsule Take 1 capsule by mouth daily.    [provider]  simvastatin (ZOCOR) 10 MG tablet Take 10 mg by mouth daily.    [provider]    Allergies Patient has no known allergies.  Family History  Problem Relation Age of Onset  . Heart disease Father   . Sudden Cardiac Death Father   . Heart disease Brother   . Hyperlipidemia Brother   . Heart disease Sister   . Heart disease Brother   . Hyperlipidemia Brother   . Hyperlipidemia Brother   . Hyperlipidemia Brother   . Hyperlipidemia Brother   . Hyperlipidemia Brother   . Heart disease Sister     Social History Social History   Tobacco Use  . Smoking status: Current Every Day Smoker    Packs/day: 0.33    Types: Cigarettes  . Smokeless tobacco: Never Used  . Tobacco comment: 6 cigarettes daily   Substance Use Topics  . Alcohol use: No    Frequency: Never  . Drug use: No    Review of Systems Constitutional: No fever/chills Cardiovascular:  Denies chest pain. Respiratory: Denies shortness of breath. Musculoskeletal: Negative for hand pain. Skin: Healing laceration. Neurological: Negative for focal weakness or numbness. ____________________________________________   PHYSICAL EXAM:  VITAL SIGNS: ED Triage Vitals  Enc Vitals Group     BP 08/11/17 1318 136/60     Pulse Rate 08/11/17 1318 63     Resp 08/11/17 1318 18     Temp 08/11/17 1318 97.9 F (36.6 C)     Temp Source 08/11/17 1318 Oral     SpO2 08/11/17 1318 99 %     Weight 08/11/17 1314 130 lb (59 kg)     Height 08/11/17 1314 5\' 10"  (1.778 m)     Head Circumference --      Peak Flow --      Pain Score 08/11/17 1314 0     Pain Loc --      Pain Edu? --      Excl. in [place]? --    Constitutional: Alert and oriented. Well appearing and in no acute distress. Eyes: Conjunctivae are normal.  Head: Atraumatic. Neck: No stridor.   Cardiovascular: Normal rate, regular rhythm. Grossly normal heart sounds.  Good peripheral circulation. Respiratory: Normal respiratory effort.  No retractions. Lungs CTAB. Musculoskeletal: Moves left thumb without any difficulty. Neurologic:  Normal speech and language. No gross focal neurologic deficits are appreciated.  Skin:  Skin is warm, dry and intact.  Sutured area has healed without any evidence of infection. Psychiatric: Mood and affect are normal. Speech and behavior are normal.  ____________________________________________   LABS (all labs ordered are listed, but only abnormal results are displayed)  Labs Reviewed - No data to display   PROCEDURES  Procedure(s) performed: None  Procedures  Critical Care performed: No  ____________________________________________   INITIAL IMPRESSION / ASSESSMENT AND PLAN / ED COURSE Patient here for suture removal after being seen in the emergency department 10 days ago.  He denies any difficulty with his wound.  There is been no fever or chills, no drainage or erythema.  Sutures  were removed by the RN and patient was discharged follow-up with his PCP if any concerns.  ____________________________________________   FINAL CLINICAL IMPRESSION(S) / ED DIAGNOSES  Final diagnoses:  Encounter for removal of sutures     ED Discharge Orders    None       Note:  This document was prepared using Dragon voice recognition software and may include unintentional dictation errors.    Johnn Hai, PA-C 08/11/17 1346    Carrie Mew, MD 08/11/17 [phone]

## 2017-08-11 NOTE — ED Notes (Signed)
Pt verbalized understanding of discharge instructions. NAD at this time. 

## 2017-08-11 NOTE — Discharge Instructions (Addendum)
Follow-up with your primary care provider if any continued problems.  Continue cleaning the area with mild soap and water and watch for any signs of infection.

## 2017-11-15 ENCOUNTER — Ambulatory Visit: Payer: Medicare Other | Admitting: Nurse Practitioner

## 2018-06-01 ENCOUNTER — Other Ambulatory Visit: Payer: Self-pay | Admitting: Nurse Practitioner

## 2018-06-01 DIAGNOSIS — E782 Mixed hyperlipidemia: Secondary | ICD-10-CM

## 2018-06-01 MED ORDER — SIMVASTATIN 10 MG PO TABS: 10.0000 mg | ORAL_TABLET | Freq: Every day | ORAL | 0 refills | Status: DC

## 2018-06-01 NOTE — Telephone Encounter (Signed)
Pt wife called request a refill on Zocor called into [facility] in [place]

## 2018-06-08 ENCOUNTER — Emergency Department
Admission: EM | Admit: 2018-06-08 | Discharge: 2018-06-08 | Disposition: A | Discharge status: Home / Self Care | Payer: Medicare Other | Attending: Emergency Medicine | Admitting: Emergency Medicine

## 2018-06-08 ENCOUNTER — Other Ambulatory Visit: Payer: Self-pay

## 2018-06-08 DIAGNOSIS — B029 Zoster without complications: Secondary | ICD-10-CM | POA: Insufficient documentation

## 2018-06-08 DIAGNOSIS — Z7982 Long term (current) use of aspirin: Secondary | ICD-10-CM | POA: Insufficient documentation

## 2018-06-08 DIAGNOSIS — R21 Rash and other nonspecific skin eruption: Secondary | ICD-10-CM | POA: Diagnosis present

## 2018-06-08 DIAGNOSIS — Z79899 Other long term (current) drug therapy: Secondary | ICD-10-CM | POA: Insufficient documentation

## 2018-06-08 DIAGNOSIS — F17210 Nicotine dependence, cigarettes, uncomplicated: Secondary | ICD-10-CM | POA: Diagnosis not present

## 2018-06-08 MED ORDER — VALACYCLOVIR HCL 1 G PO TABS: 1000.0000 mg | ORAL_TABLET | Freq: Three times a day (TID) | ORAL | 0 refills | Status: AC

## 2018-06-08 NOTE — ED Provider Notes (Signed)
[facility] Emergency Department Provider Note  ____________________________________________  Time seen: Approximately 4:50 PM  I have reviewed the triage vital signs and the nursing notes.   HISTORY  Chief Complaint Rash and Herpes Zoster    HPI Scott Oconnor is a 73 y.o. male with a history of hyperlipidemia who comes to the ED complaining of a blistering rash over the right upper abdomen that started today.  He reports that he has been having burning pain in this area for the past 7 days without aggravating or alleviating factors, mild, nonradiating, and today started having the rash.  He denies fevers chills body aches headaches vision changes eye pain chest pain or shortness of breath.  He had chickenpox when he was about 73 years old.  He recently recovered from an influenza-like illness a week ago.      Past Medical History:  Diagnosis Date  . Hyperlipidemia      Patient Active Problem List   Diagnosis Date Noted  . Smokers' cough ([place]) 06/16/2017     Past surgical history noncontributory   Prior to Admission medications   Medication Sig Start Date End Date Taking? Authorizing Provider  aspirin EC 81 MG tablet Take 81 mg by mouth daily.    [provider]  Multiple Vitamin (MULTIVITAMIN) capsule Take 1 capsule by mouth daily.    [provider]  simvastatin (ZOCOR) 10 MG tablet Take 1 tablet (10 mg total) by mouth daily. NEED APPT 06/01/18   Mikey College, NP  valACYclovir (VALTREX) 1000 MG tablet Take 1 tablet (1,000 mg total) by mouth 3 (three) times daily for 7 days. 06/08/18 06/15/18  Carrie Mew, MD     Allergies Patient has no known allergies.   Family History  Problem Relation Age of Onset  . Heart disease Father   . Sudden Cardiac Death Father   . Heart disease Brother   . Hyperlipidemia Brother   . Heart disease Sister   . Heart disease Brother   . Hyperlipidemia Brother   . Hyperlipidemia  Brother   . Hyperlipidemia Brother   . Hyperlipidemia Brother   . Hyperlipidemia Brother   . Heart disease Sister     Social History Social History   Tobacco Use  . Smoking status: Current Every Day Smoker    Packs/day: 0.33    Types: Cigarettes  . Smokeless tobacco: Never Used  . Tobacco comment: 6 cigarettes daily   Substance Use Topics  . Alcohol use: No    Frequency: Never  . Drug use: No    Review of Systems  Constitutional:   No fever or chills.  ENT:   No sore throat. No rhinorrhea. Cardiovascular:   No chest pain or syncope. Respiratory:   No dyspnea or cough. Gastrointestinal:   Negative for abdominal pain, vomiting and diarrhea.  Musculoskeletal:   Negative for focal pain or swelling All other systems reviewed and are negative except as documented above in ROS and HPI.  ____________________________________________   PHYSICAL EXAM:  VITAL SIGNS: ED Triage Vitals  Enc Vitals Group     BP 06/08/18 1549 (!) 156/90     Pulse Rate 06/08/18 1549 72     Resp 06/08/18 1549 18     Temp 06/08/18 1549 98.4 F (36.9 C)     Temp Source 06/08/18 1549 Oral     SpO2 06/08/18 1549 97 %     Weight 06/08/18 1551 130 lb (59 kg)     Height 06/08/18 1551  5\' 10"  (1.778 m)     Head Circumference --      Peak Flow --      Pain Score 06/08/18 1623 3     Pain Loc --      Pain Edu? --      Excl. in [place]? --     Vital signs reviewed, nursing assessments reviewed.   Constitutional:   Alert and oriented. Non-toxic appearance. Eyes:   Conjunctivae are normal. EOMI.  ENT      Head:   Normocephalic and atraumatic.      Nose:   No congestion/rhinnorhea.       Mouth/Throat:   MMM      Neck:   No meningismus. Full ROM. Hematological/Lymphatic/Immunilogical:   No cervical lymphadenopathy.  Gastrointestinal:   Soft and nontender. Non distended. There is no CVA tenderness.  No rebound, rigidity, or guarding. Musculoskeletal:   Normal range of motion in all extremities. No joint  effusions.  No lower extremity tenderness.  No edema. Neurologic:   Normal speech and language.  Motor grossly intact. No acute focal neurologic deficits are appreciated.  Skin:    Skin is warm, dry with crops of a vesicular rash on a red base extending from just right of the midline on his back to the of the midline on his front in the distribution of T6. ____________________________________________    LABS (pertinent positives/negatives) (all labs ordered are listed, but only abnormal results are displayed) Labs Reviewed - No data to display ____________________________________________   EKG    ____________________________________________    RADIOLOGY  No results found.  ____________________________________________   PROCEDURES Procedures  ____________________________________________    CLINICAL IMPRESSION / ASSESSMENT AND PLAN / ED COURSE  Pertinent labs & imaging results that were available during my care of the patient were reviewed by me and considered in my medical decision making (see chart for details).    Patient presents with burning discomfort of the right upper abdomen, clearly herpes zoster.  Educated the patient and family regarding this.  His other family members in the room have all had chickenpox illness.  No young children or pregnant family members at home.  They will keep it covered until the blisters have opened and dried.  Given his age and the new vesicles today I will start Valtrex to limit duration of illness, contagiousness, and hopefully postherpetic neuralgia.  He is nontoxic without any systemic symptoms and suitable for discharge      ____________________________________________   FINAL CLINICAL IMPRESSION(S) / ED DIAGNOSES    Final diagnoses:  Herpes zoster without complication     ED Discharge Orders         Ordered    valACYclovir (VALTREX) 1000 MG tablet  3 times daily     06/08/18 1649          Portions of this  note were generated with dragon dictation software. Dictation errors may occur despite best attempts at proofreading.   Carrie Mew, MD 06/08/18 [phone]

## 2018-06-08 NOTE — ED Triage Notes (Signed)
Pt reports having abd pain a week ago, now pt has a red blistery rash across his abd

## 2018-06-15 ENCOUNTER — Encounter: Payer: Self-pay | Admitting: Nurse Practitioner

## 2018-06-15 ENCOUNTER — Ambulatory Visit (INDEPENDENT_AMBULATORY_CARE_PROVIDER_SITE_OTHER): Payer: Medicare Other | Admitting: Nurse Practitioner

## 2018-06-15 ENCOUNTER — Other Ambulatory Visit: Payer: Self-pay

## 2018-06-15 VITALS — BP 123/71 | HR 67 | Temp 98.3°F | Ht 70.0 in | Wt 125.0 lb

## 2018-06-15 DIAGNOSIS — B0229 Other postherpetic nervous system involvement: Secondary | ICD-10-CM | POA: Diagnosis not present

## 2018-06-15 DIAGNOSIS — E782 Mixed hyperlipidemia: Secondary | ICD-10-CM | POA: Diagnosis not present

## 2018-06-15 DIAGNOSIS — Z79899 Other long term (current) drug therapy: Secondary | ICD-10-CM

## 2018-06-15 DIAGNOSIS — Z8249 Family history of ischemic heart disease and other diseases of the circulatory system: Secondary | ICD-10-CM | POA: Diagnosis not present

## 2018-06-15 MED ORDER — GABAPENTIN 100 MG PO CAPS: 200.0000 mg | ORAL_CAPSULE | Freq: Every day | ORAL | 0 refills | Status: DC

## 2018-06-15 NOTE — Progress Notes (Signed)
Subjective:    Patient ID: Scott Oconnor, male    DOB: 1945-10-12, 73 y.o.   MRN: [id number]  Scott Oconnor is a 73 y.o. male presenting on 06/15/2018 for Herpes Zoster   HPI Shingles/ED followup Patient had onset of shingles rash on 06/08/2018 with pain for 7 days prior to rash eruption.  He was evaluated and treated for Shingles at [facility] ED.  Today, patient notes rash is significantly improved with all lesions scabbed.  Now starting to have some itching with this healing.  He is finishing with valtrex prescription today.  Still having pain that does interfere with lifestyle. He is taking Tylenol with mild relief for about 3-4 hours only.     Cholesterol Family history of early ASCVD.  Patient has not had any ASCVD events to date.  Takes statin for risk reduction and tolerates well without side effects.   - Pt denies changes in vision, chest tightness/pressure, palpitations, shortness of breath, leg pain while walking, leg or arm weakness, and sudden loss of speech or loss of consciousness.   Social History   Tobacco Use  . Smoking status: Current Every Day Smoker    Packs/day: 0.33    Types: Cigarettes  . Smokeless tobacco: Never Used  . Tobacco comment: 6 cigarettes daily   Substance Use Topics  . Alcohol use: No    Frequency: Never  . Drug use: No    Review of Systems Per HPI unless specifically indicated above     Objective:    BP 123/71 (BP Location: Right Arm, Patient Position: Sitting, Cuff Size: Normal)   Pulse 67   Temp 98.3 F (36.8 C) (Oral)   Ht 5\' 10"  (1.778 m)   Wt 125 lb (56.7 kg)   BMI 17.94 kg/m   Wt Readings from Last 3 Encounters:  06/15/18 125 lb (56.7 kg)  06/08/18 130 lb (59 kg)  08/11/17 130 lb (59 kg)    Physical Exam Vitals signs reviewed.  Constitutional:      General: He is awake. He is not in acute distress.    Appearance: He is well-developed.  HENT:     Head: Normocephalic and atraumatic.  Neck:     Musculoskeletal: Normal  range of motion and neck supple.     Vascular: No carotid bruit.  Cardiovascular:     Rate and Rhythm: Normal rate and regular rhythm.     Pulses:          Radial pulses are 2+ on the right side and 2+ on the left side.       Posterior tibial pulses are 1+ on the right side and 1+ on the left side.     Heart sounds: Normal heart sounds, S1 normal and S2 normal.  Pulmonary:     Effort: Pulmonary effort is normal. No respiratory distress.     Breath sounds: Normal breath sounds and air entry.  Skin:    General: Skin is warm and dry.     Findings: Lesion present.       Neurological:     Mental Status: He is alert and oriented to person, place, and time.  Psychiatric:        Attention and Perception: Attention normal.        Mood and Affect: Mood and affect normal.        Behavior: Behavior normal. Behavior is cooperative.    Results for orders placed or performed in visit on 06/16/17  Comprehensive metabolic panel  Result Value  Ref Range   Glucose, Bld 119 (H) 65 - 99 mg/dL   BUN 21 7 - 25 mg/dL   Creat 1.27 (H) 0.70 - 1.18 mg/dL   BUN/Creatinine Ratio 17 6 - 22 (calc)   Sodium 140 135 - 146 mmol/L   Potassium 3.7 3.5 - 5.3 mmol/L   Chloride 104 98 - 110 mmol/L   CO2 30 20 - 32 mmol/L   Calcium 9.0 8.6 - 10.3 mg/dL   Total Protein 6.6 6.1 - 8.1 g/dL   Albumin 3.9 3.6 - 5.1 g/dL   Globulin 2.7 1.9 - 3.7 g/dL (calc)   AG Ratio 1.4 1.0 - 2.5 (calc)   Total Bilirubin 0.5 0.2 - 1.2 mg/dL   Alkaline phosphatase (APISO) 86 40 - 115 U/L   AST 16 10 - 35 U/L   ALT 14 9 - 46 U/L  Lipid panel  Result Value Ref Range   Cholesterol 136 <200 mg/dL   HDL 49 >40 mg/dL   Triglycerides 86 <150 mg/dL   LDL Cholesterol (Calc) 70 mg/dL (calc)   Total CHOL/HDL Ratio 2.8 <5.0 (calc)   Non-HDL Cholesterol (Calc) 87 <130 mg/dL (calc)      Assessment & Plan:   Problem List Items Addressed This Visit      Other   Mixed dyslipidemia   Relevant Orders   COMPLETE METABOLIC PANEL WITH GFR     Lipid panel   Family history of heart attack   Relevant Orders   COMPLETE METABOLIC PANEL WITH GFR   Lipid panel    Other Visit Diagnoses    Postherpetic neuralgia    -  Primary   Relevant Medications   gabapentin (NEURONTIN) 100 MG capsule   Long-term use of high-risk medication       Relevant Orders   COMPLETE METABOLIC PANEL WITH GFR   Lipid panel     # Primary prevention ASCVD: family history early heart attack and dyslipidemia  Patient continues to be asymptomatic.  Tolerates statin well. Continue at current doses.  Refills provided.  Check labs today. Continue heart healthy diet and active lifestyle. Followup 1 year.   # Herpes Zoster Resolving, but with postherpetic neuralgia not responding well to OTC analgesics. - START gabapentin 100-200 mg at bedtime for up to 30 days.  Discussed can add daytime dosing in future if needed. - Follow-up prn.  Meds ordered this encounter  Medications  . gabapentin (NEURONTIN) 100 MG capsule    Sig: Take 2 capsules (200 mg total) by mouth at bedtime for 30 days.    Dispense:  60 capsule    Refill:  0    Order Specific Question:   Supervising Provider    Answer:   Olin Hauser [[id number]]    Follow up plan: Return in about 1 month (around 07/16/2018) for medicare wellness visit with Tiffany AND in 1 year with me for cholesterol.  Cassell Smiles, DNP, AGPCNP-BC Adult Gerontology Primary Care Nurse Practitioner [facility] 06/15/2018, 11:22 AM

## 2018-06-15 NOTE — Patient Instructions (Addendum)
Gevena Barre,   Thank you for coming in to clinic today.  1. You will be due for FASTING BLOOD WORK.  This means you should eat no food or drink after midnight.  Drink only water or coffee without cream/sugar on the morning of your lab visit. - Please go ahead and schedule a "Lab Only" visit in the morning at the clinic for lab draw in the next 7 days. - Your results will be available about 2-3 days after blood draw.  If you have set up a MyChart account, you can can log in to MyChart online to view your results and a brief explanation. Also, we can discuss your results together at your next office visit if you would like.  2. For your shingles, start gabapentin 100-200 mg at bedtime for pain control.  Please schedule a follow-up appointment with Cassell Smiles, AGNP. Return in about 1 month (around 07/16/2018) for medicare wellness visit with Tiffany AND in 1 year with me for cholesterol.  If you have any other questions or concerns, please feel free to call the clinic or send a message through [facility]. You may also schedule an earlier appointment if necessary.  You will receive a survey after today's visit either digitally by e-mail or paper by [employer]. Your experiences and feedback matter to [place].  Please respond so we know how we are doing as we provide care for you.   Cassell Smiles, DNP, AGNP-BC Adult Gerontology Nurse Practitioner [place]

## 2018-06-18 ENCOUNTER — Other Ambulatory Visit: Payer: Medicare Other

## 2018-06-19 ENCOUNTER — Other Ambulatory Visit: Payer: Medicare Other

## 2018-06-20 LAB — COMPLETE METABOLIC PANEL WITH GFR
AG Ratio: 1.4 (calc) (ref 1.0–2.5)
ALT: 18 U/L (ref 9–46)
AST: 17 U/L (ref 10–35)
Albumin: 3.9 g/dL (ref 3.6–5.1)
Alkaline phosphatase (APISO): 82 U/L (ref 40–115)
BUN/Creatinine Ratio: 11 (calc) (ref 6–22)
BUN: 14 mg/dL (ref 7–25)
CO2: 32 mmol/L (ref 20–32)
Calcium: 9.2 mg/dL (ref 8.6–10.3)
Chloride: 104 mmol/L (ref 98–110)
Creat: 1.3 mg/dL — ABNORMAL HIGH (ref 0.70–1.18)
GFR, Est African American: 64 mL/min/{1.73_m2} (ref >=60)
GFR, Est Non African American: 55 mL/min/{1.73_m2} — ABNORMAL LOW (ref >=60)
Globulin: 2.8 g/dL (calc) (ref 1.9–3.7)
Glucose, Bld: 114 mg/dL — ABNORMAL HIGH (ref 65–99)
Potassium: 3.8 mmol/L (ref 3.5–5.3)
Sodium: 142 mmol/L (ref 135–146)
Total Bilirubin: 0.5 mg/dL (ref 0.2–1.2)
Total Protein: 6.7 g/dL (ref 6.1–8.1)

## 2018-06-20 LAB — LIPID PANEL
Cholesterol: 138 mg/dL (ref <200)
HDL: 44 mg/dL (ref >40)
LDL Cholesterol (Calc): 74 mg/dL (calc)
Non-HDL Cholesterol (Calc): 94 mg/dL (calc) (ref <130)
Total CHOL/HDL Ratio: 3.1 (calc) (ref <5.0)
Triglycerides: 121 mg/dL (ref <150)

## 2018-07-10 ENCOUNTER — Other Ambulatory Visit: Payer: Self-pay | Admitting: Nurse Practitioner

## 2018-07-10 DIAGNOSIS — E782 Mixed hyperlipidemia: Secondary | ICD-10-CM

## 2018-07-12 ENCOUNTER — Ambulatory Visit (INDEPENDENT_AMBULATORY_CARE_PROVIDER_SITE_OTHER): Payer: Medicare Other | Admitting: Nurse Practitioner

## 2018-07-12 ENCOUNTER — Other Ambulatory Visit: Payer: Self-pay

## 2018-07-12 ENCOUNTER — Encounter: Payer: Self-pay | Admitting: Nurse Practitioner

## 2018-07-12 VITALS — BP 175/75 | HR 69 | Temp 98.2°F | Ht 70.0 in | Wt 127.0 lb

## 2018-07-12 DIAGNOSIS — E782 Mixed hyperlipidemia: Secondary | ICD-10-CM | POA: Diagnosis not present

## 2018-07-12 DIAGNOSIS — I7300 Raynaud's syndrome without gangrene: Secondary | ICD-10-CM

## 2018-07-12 DIAGNOSIS — M79675 Pain in left toe(s): Secondary | ICD-10-CM

## 2018-07-12 DIAGNOSIS — R230 Cyanosis: Secondary | ICD-10-CM

## 2018-07-12 DIAGNOSIS — M79674 Pain in right toe(s): Secondary | ICD-10-CM | POA: Diagnosis not present

## 2018-07-12 MED ORDER — AMLODIPINE BESYLATE 5 MG PO TABS: 5.0000 mg | ORAL_TABLET | Freq: Every day | ORAL | 1 refills | Status: DC

## 2018-07-12 NOTE — Progress Notes (Signed)
Subjective:    Patient ID: Scott Oconnor, male    DOB: 1945-09-05, 73 y.o.   MRN: [id number]  Scott Oconnor is a 73 y.o. male presenting on 07/12/2018 for Foot Pain (constant bilateral great toe pain and 5th metatarsal on the Rt toe x 1 yrs. Pt states the pain is so severe at times that its sensative to any type of touch. He also notice some discoloration with his toes.)   HPI Foot Pain Patient with constant bilateral great toe pain, 5th metatarsal Right x 1 year.  Nothing changes the intensity of pain.  Patient has stopped going to the gym because it is difficult to wear shoes.  Blanket contact also has to be limited.  Patient has no swelling, but has bluish color that is worse in winter. - Hands to fingertips also turn blue during winter.   Shingles Continues to have postherpetic neuralgia.  Takes Tylenol with a little reduction of pain.  Pain is continuing to slowly improve over last 2-3 weeks.   Social History   Tobacco Use  . Smoking status: Current Every Day Smoker    Packs/day: 0.33    Types: Cigarettes  . Smokeless tobacco: Never Used  . Tobacco comment: 6 cigarettes daily   Substance Use Topics  . Alcohol use: No    Frequency: Never  . Drug use: No    Review of Systems Per HPI unless specifically indicated above     Objective:    BP (!) 175/75 (BP Location: Right Arm, Patient Position: Sitting, Cuff Size: Normal)   Pulse 69   Temp 98.2 F (36.8 C) (Oral)   Ht 5\' 10"  (1.778 m)   Wt 127 lb (57.6 kg)   SpO2 100%   BMI 18.22 kg/m   Wt Readings from Last 3 Encounters:  07/12/18 127 lb (57.6 kg)  06/15/18 125 lb (56.7 kg)  06/08/18 130 lb (59 kg)    Physical Exam Vitals signs reviewed.  Constitutional:      General: He is not in acute distress.    Appearance: He is well-developed.  HENT:     Head: Normocephalic and atraumatic.  Cardiovascular:     Rate and Rhythm: Normal rate and regular rhythm.     Pulses:          Radial pulses are 2+ on the  right side and 2+ on the left side.       Dorsalis pedis pulses are 1+ on the right side and 1+ on the left side.       Posterior tibial pulses are 1+ on the right side and 1+ on the left side.     Heart sounds: Normal heart sounds, S1 normal and S2 normal.  Pulmonary:     Effort: Pulmonary effort is normal. No respiratory distress.     Breath sounds: Normal breath sounds and air entry.  Musculoskeletal:     Right lower leg: No edema.     Left lower leg: No edema.     Right foot: Decreased capillary refill (with dusky appearance R < L, of Great toe and all toes.  Mild mottled appearance to mid-foot). Normal range of motion. Tenderness (to right great toe) present. No bony tenderness, swelling, crepitus, deformity or laceration.     Left foot: Decreased capillary refill (with dusky appearance of Great toe and all toes.  Mild mottled appearance to mid-foot). Normal range of motion. Tenderness (to left great toe) present. No bony tenderness, swelling, crepitus, deformity or laceration.  Skin:  General: Skin is warm and dry.     Capillary Refill: Capillary refill takes less than 2 seconds.  Neurological:     General: No focal deficit present.     Mental Status: He is alert and oriented to person, place, and time. Mental status is at baseline.  Psychiatric:        Attention and Perception: Attention normal.        Mood and Affect: Mood and affect normal.        Behavior: Behavior normal. Behavior is cooperative.        Thought Content: Thought content normal.        Judgment: Judgment normal.    Results for orders placed or performed in visit on 06/15/18  COMPLETE METABOLIC PANEL WITH GFR  Result Value Ref Range   Glucose, Bld 114 (H) 65 - 99 mg/dL   BUN 14 7 - 25 mg/dL   Creat 1.30 (H) 0.70 - 1.18 mg/dL   GFR, Est Non African American 55 (L) > OR = 60 mL/min/1.61m2   GFR, Est African American 64 > OR = 60 mL/min/1.39m2   BUN/Creatinine Ratio 11 6 - 22 (calc)   Sodium 142 135 - 146  mmol/L   Potassium 3.8 3.5 - 5.3 mmol/L   Chloride 104 98 - 110 mmol/L   CO2 32 20 - 32 mmol/L   Calcium 9.2 8.6 - 10.3 mg/dL   Total Protein 6.7 6.1 - 8.1 g/dL   Albumin 3.9 3.6 - 5.1 g/dL   Globulin 2.8 1.9 - 3.7 g/dL (calc)   AG Ratio 1.4 1.0 - 2.5 (calc)   Total Bilirubin 0.5 0.2 - 1.2 mg/dL   Alkaline phosphatase (APISO) 82 40 - 115 U/L   AST 17 10 - 35 U/L   ALT 18 9 - 46 U/L  Lipid panel  Result Value Ref Range   Cholesterol 138 <200 mg/dL   HDL 44 >40 mg/dL   Triglycerides 121 <150 mg/dL   LDL Cholesterol (Calc) 74 mg/dL (calc)   Total CHOL/HDL Ratio 3.1 <5.0 (calc)   Non-HDL Cholesterol (Calc) 94 <130 mg/dL (calc)      Assessment & Plan:   Problem List Items Addressed This Visit    None    Visit Diagnoses    Raynaud's disease without gangrene    -  Primary   Relevant Medications   amLODipine (NORVASC) 5 MG tablet   Other Relevant Orders   VAS [place] ABI WITH/WO TBI   Pain of left great toe       Relevant Orders   VAS [place] ABI WITH/WO TBI   Pain of right great toe       Relevant Orders   VAS [place] ABI WITH/WO TBI   Mixed hyperlipidemia       Relevant Medications   amLODipine (NORVASC) 5 MG tablet   Other Relevant Orders   VAS [place] ABI WITH/WO TBI   Toe cyanosis       Relevant Medications   amLODipine (NORVASC) 5 MG tablet   Other Relevant Orders   VAS [place] ABI WITH/WO TBI      Patient with new diagnosis of Raynaud's syndrome.  Patient with chronic dusky appearance with sharply demarcated line at end of toes and of fingertips > during winter.  Now with increasing pain of great toes bilaterally that may indicate concurrent arteriovascular disease in patient with known hyperlipidemia and significant family history of ASCVD events. - No current gangrene, but evidence of reduced arterial flow to feet  for extended time with mottled appearance.  Plan: 1. START amlodipine 5 mg one tab daily for improvement of Raynaud's symptoms 2. Encourage patient to keep feet warm.   Massage ok since is relieving symptoms. 3. Also follow-up with ABI/TBI for assessment of arterial flow to toes.   4. Continue management of lipids as planned previously.  Last time lipids checked, values at goals. 5. FOLLOW-UP after ABI/TBI.  May need vascular referral.  Meds ordered this encounter  Medications  . amLODipine (NORVASC) 5 MG tablet    Sig: Take 1 tablet (5 mg total) by mouth daily.    Dispense:  90 tablet    Refill:  1    Order Specific Question:   Supervising Provider    Answer:   Olin Hauser [[id number]]    Follow up plan: Return if symptoms worsen or fail to improve.  Cassell Smiles, DNP, AGPCNP-BC Adult Gerontology Primary Care Nurse Practitioner [facility] 07/12/2018, 10:31 AM

## 2018-07-12 NOTE — Patient Instructions (Addendum)
Scott Oconnor,   Thank you for coming in to clinic today.  1. 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 You will also have an ankle and toe brachial index that will check for other artery blockages to your toes.  3. Shingrix - Shingles vaccine: 2 doses 2-6 months apart.  http://www.wolf.info/ for more information if you have questions.  Please schedule a follow-up appointment with Cassell Smiles, AGNP. Return if symptoms worsen or fail to improve.  If you have any other questions or concerns, please feel free to call the clinic or send a message through [place]. You may also schedule an earlier appointment if necessary.  You will receive a survey after today's visit either digitally by e-mail or paper by [employer]. Your experiences and feedback matter to [place].  Please respond so we know how we are doing as we provide care for you.   Cassell Smiles, DNP, AGNP-BC Adult Gerontology Nurse Practitioner [facility]   Raynaud Phenomenon  Raynaud phenomenon is a condition that affects the blood vessels (arteries) that carry blood to your fingers and toes. The arteries that supply blood to your ears, lips, nipples, or the tip of your nose might also be affected. Raynaud phenomenon causes the arteries to become narrow temporarily (spasm). As a result, the flow of blood to the affected areas is temporarily decreased. This usually occurs in response to cold temperatures or stress. During an attack, the skin in the affected areas turns white, then blue, and finally red. You may also feel tingling or numbness in those areas. Attacks usually last for only a brief period, and then the blood flow to the area returns to normal. In most cases, Raynaud phenomenon does not cause serious health problems. What are the causes? In many cases, the cause of this condition is not known. The condition  may occur on its own (primary Raynaud phenomenon) or may be associated with other diseases or factors (secondary Raynaud phenomenon). Possible causes may include:  Diseases or medical conditions that damage the arteries.  Injuries and repetitive actions that hurt the hands or feet.  Being exposed to certain chemicals.  Taking medicines that narrow the arteries.  Other medical conditions, such as lupus, scleroderma, rheumatoid arthritis, thyroid problems, blood disorders, Sjogren syndrome, or atherosclerosis. What increases the risk? The following factors may make you more likely to develop this condition:  Being 46-79 years old.  Being male.  Having a family history of Raynaud phenomenon.  Living in a cold climate.  Smoking. What are the signs or symptoms? Symptoms of this condition usually occur when you are exposed to cold temperatures or when you have emotional stress. The symptoms may last for a few minutes or up to several hours. They usually affect your fingers but may also affect your toes, nipples, lips, ears, or the tip of your nose. Symptoms may include:  Changes in skin color. The skin in the affected areas will turn pale or white. The skin may then change from white to bluish to red as normal blood flow returns to the area.  Numbness, tingling, or pain in the affected areas. In severe cases, symptoms may include:  Skin sores.  Tissues decaying and dying (gangrene). How is this diagnosed? This condition may be diagnosed based on:  Your symptoms and medical history.  A physical exam. During the exam, you may be asked  to put your hands in cold water to check for a reaction to cold temperature.  Tests, such as: ? Blood tests to check for other diseases or conditions. ? A test to check the movement of blood through your arteries and veins (vascular ultrasound). ? A test in which the skin at the base of your fingernail is examined under a microscope (nailfold  capillaroscopy). How is this treated? Treatment for this condition often involves making lifestyle changes and taking steps to control your exposure to cold temperatures. For more severe cases, medicine (calcium channel blockers) may be used to improve blood flow. Surgery is sometimes done to block the nerves that control the affected arteries, but this is rare. Follow these instructions at home: Avoiding cold temperatures Take these steps to avoid exposure to cold:  If possible, stay indoors during cold weather.  When you go outside during cold weather, dress in layers and wear mittens, a hat, a scarf, and warm footwear.  Wear mittens or gloves when handling ice or frozen food.  Use holders for glasses or cans containing cold drinks.  Let warm water run for a while before taking a shower or bath.  Warm up the car before driving in cold weather. Lifestyle   If possible, avoid stressful and emotional situations. Try to find ways to manage your stress, such as: ? Exercise. ? Yoga. ? Meditation. ? Biofeedback.  Do not use any products that contain nicotine or tobacco, such as cigarettes and e-cigarettes. If you need help quitting, ask your health care provider.  Avoid secondhand smoke.  Limit your use of caffeine. ? Switch to decaffeinated coffee, tea, and soda. ? Avoid chocolate.  Avoid vibrating tools and machinery. General instructions  Protect your hands and feet from injuries, cuts, or bruises.  Avoid wearing tight rings or wristbands.  Wear loose fitting socks and comfortable, roomy shoes.  Take over-the-counter and prescription medicines only as told by your health care provider. Contact a health care provider if:  Your discomfort becomes worse despite lifestyle changes.  You develop sores on your fingers or toes that do not heal.  Your fingers or toes turn black.  You have breaks in the skin on your fingers or toes.  You have a fever.  You have pain or  swelling in your joints.  You have a rash.  Your symptoms occur on only one side of your body. Summary  Raynaud phenomenon is a condition that affects the arteries that carry blood to your fingers, toes, ears, lips, nipples, or the tip of your nose.  In many cases, the cause of this condition is not known.  Symptoms of this condition include changes in skin color, and numbness and tingling of the affected area.  Treatment for this condition includes lifestyle changes, reducing exposure to cold temperatures, and using medicines for severe cases of the condition.  Contact your health care provider if your condition worsens despite treatment. This information is not intended to replace advice given to you by your health care provider. Make sure you discuss any questions you have with your health care provider. Document Released: 05/06/2000 Document Revised: 11/22/2016 Document Reviewed: 06/20/2016 Elsevier Interactive Patient Education  2019 [employer].

## 2018-07-13 ENCOUNTER — Encounter: Payer: Self-pay | Admitting: Nurse Practitioner

## 2018-07-16 ENCOUNTER — Ambulatory Visit: Payer: Medicare Other | Admitting: Nurse Practitioner

## 2018-07-23 ENCOUNTER — Telehealth: Payer: Self-pay | Admitting: Nurse Practitioner

## 2018-07-23 NOTE — Telephone Encounter (Signed)
Wife called to check status of referral for foot.  Her call back number is [phone]

## 2018-07-24 NOTE — Telephone Encounter (Signed)
Coralie Keens is working on this for [place].

## 2018-07-25 ENCOUNTER — Ambulatory Visit (INDEPENDENT_AMBULATORY_CARE_PROVIDER_SITE_OTHER): Payer: Medicare Other

## 2018-07-25 DIAGNOSIS — M79674 Pain in right toe(s): Secondary | ICD-10-CM

## 2018-07-25 DIAGNOSIS — I7300 Raynaud's syndrome without gangrene: Secondary | ICD-10-CM

## 2018-07-25 DIAGNOSIS — E782 Mixed hyperlipidemia: Secondary | ICD-10-CM

## 2018-07-25 DIAGNOSIS — R230 Cyanosis: Secondary | ICD-10-CM

## 2018-07-25 DIAGNOSIS — M79675 Pain in left toe(s): Secondary | ICD-10-CM | POA: Diagnosis not present

## 2018-07-27 NOTE — Addendum Note (Signed)
Addended by: Cleaster Corin on: 07/27/2018 04:47 PM   Modules accepted: Orders

## 2018-07-30 ENCOUNTER — Telehealth: Payer: Self-pay | Admitting: Nurse Practitioner

## 2018-07-30 NOTE — Telephone Encounter (Signed)
Attempted to contact the pt, no answer. LMOM to return your call.

## 2018-07-30 NOTE — Telephone Encounter (Signed)
Anderson Malta called for [place] results [phone]

## 2018-08-07 ENCOUNTER — Encounter (INDEPENDENT_AMBULATORY_CARE_PROVIDER_SITE_OTHER): Payer: Medicare Other | Admitting: Vascular Surgery

## 2018-09-04 ENCOUNTER — Encounter (INDEPENDENT_AMBULATORY_CARE_PROVIDER_SITE_OTHER): Payer: Medicare Other | Admitting: Vascular Surgery

## 2019-01-03 ENCOUNTER — Other Ambulatory Visit: Payer: Self-pay | Admitting: Nurse Practitioner

## 2019-01-03 DIAGNOSIS — R230 Cyanosis: Secondary | ICD-10-CM

## 2019-01-03 DIAGNOSIS — I7300 Raynaud's syndrome without gangrene: Secondary | ICD-10-CM

## 2019-06-21 ENCOUNTER — Other Ambulatory Visit: Payer: Self-pay

## 2019-06-21 ENCOUNTER — Encounter: Payer: Self-pay | Admitting: Family Medicine

## 2019-06-21 ENCOUNTER — Ambulatory Visit (INDEPENDENT_AMBULATORY_CARE_PROVIDER_SITE_OTHER): Payer: Medicare Other | Admitting: Family Medicine

## 2019-06-21 DIAGNOSIS — L853 Xerosis cutis: Secondary | ICD-10-CM | POA: Diagnosis not present

## 2019-06-21 DIAGNOSIS — I7300 Raynaud's syndrome without gangrene: Secondary | ICD-10-CM | POA: Diagnosis not present

## 2019-06-21 MED ORDER — CLOTRIMAZOLE-BETAMETHASONE 1-0.05 % EX CREA: TOPICAL_CREAM | CUTANEOUS | 1 refills | Status: DC

## 2019-06-21 MED ORDER — SILDENAFIL CITRATE 20 MG PO TABS: ORAL_TABLET | ORAL | 3 refills | Status: DC

## 2019-06-21 NOTE — Patient Instructions (Addendum)
You have Raynaud's vascular issue of circulation in toes and fingers, causes them to turn blue and be painful.  Since the first medication they prescribed you last year didn't work, we can try another  Sildenafil (generic viagra) 20mg  pills, take one pill daily for now, if it is not effective, then you can increase to 1 pill TWICE a day, only take while it is cold out and the toes/fingers are bothering you, otherwise, don't take it.  Use www.goodrx.com to download free coupon - for [facility] and you can print and show this to them OR you can download the app for your phone and show them the coupon  Make sure you select 20mg  and make sure you select quantity: 60 pills.  https://www.goodrx.com/sildenafil?dosage=20mg &form=tablet&label_override=sildenafil&quantity=60&sort_type=popularity  Stay tuned for appointment from vascular specialist   [facility], [place], [place] [address]  Main: [phone]    Please schedule a Follow-up Appointment to: Return if symptoms worsen or fail to improve, for raynaud's.  If you have any other questions or concerns, please feel free to call the office or send a message through [place]. You may also schedule an earlier appointment if necessary.  Additionally, you may be receiving a survey about your experience at our office within a few days to 1 week by e-mail or mail. We value your feedback.  Nobie Putnam, DO [place]

## 2019-06-21 NOTE — Progress Notes (Signed)
Virtual Visit via Telephone The purpose of this virtual visit is to provide medical care while limiting exposure to the novel coronavirus (COVID19) for both patient and office staff.  Consent was obtained for phone visit:  Yes.   Answered questions that patient had about telehealth interaction:  Yes.   I discussed the limitations, risks, security and privacy concerns of performing an evaluation and management service by telephone. I also discussed with the patient that there may be a patient responsible charge related to this service. The patient expressed understanding and agreed to proceed.  Patient Location: Home Provider Location: [facility])  ---------------------------------------------------------------------- Chief Complaint  Patient presents with  . Toe Pain    chronic bilateral great toe circulation problems w/ discoloration and pain. The pt state the pain worsen when its cold outside.  Right toes is swollen and more painful x 1 year     S: Reviewed CMA documentation. I have called patient and gathered additional HPI as follows:  Raynaud's Vascular Disease Toes / Fingers / Dry Skin Dermatitis - Last visit with prior PCP Cassell Smiles, AGPCNP-BC for initial visit for same problem was on 07/12/18 last year, treated with trial on Amlodipine CCB and referral to Vascular ([facility]), see prior notes for background information. - Interval update with he tried Amlodipine but had side effect and stopped med. He also had ABI vasc ultrasound completed that showed Raynaud's, the referral was placed but unable to schedule him and this was cancelled due to [place] - Today patient reports same issue now with episodic pain in fingers/toes if exposure to cold, and some color change bluish at times. He is interested in referral again and if any other treatment. - Also admits dry skin flaking of toes. - He sent pictures - He has no heart disease. He has not taken  Sildenafil/Viagra before. Other risk factors he is a smoker and has hyperlipidemia Denies any new injury, trauma, fall, numbness tingling or injury  Denies any high risk travel to areas of current concern for COVID19. Denies any known or suspected exposure to person with or possibly with COVID19.  Denies any fevers, chills, sweats, body ache, cough, shortness of breath, sinus pain or pressure, headache, abdominal pain, diarrhea  Past Medical History:  Diagnosis Date  . Hyperlipidemia    Social History   Tobacco Use  . Smoking status: Current Every Day Smoker    Packs/day: 0.33    Types: Cigarettes  . Smokeless tobacco: Never Used  . Tobacco comment: 6 cigarettes daily   Substance Use Topics  . Alcohol use: No  . Drug use: No    Current Outpatient Medications:  .  aspirin EC 81 MG tablet, Take 81 mg by mouth daily., Disp: , Rfl:  .  Multiple Vitamin (MULTIVITAMIN) capsule, Take 1 capsule by mouth daily., Disp: , Rfl:  .  simvastatin (ZOCOR) 10 MG tablet, TAKE 1 TABLET BY MOUTH DAILY, Disp: 90 tablet, Rfl: 4 .  acetaminophen (TYLENOL) 500 MG tablet, Take 1,000 mg by mouth at bedtime. , Disp: , Rfl:  .  clotrimazole-betamethasone (LOTRISONE) cream, Apply 1-2 times a day for worsening flare dry skin dermatitis of toes/feet, may re-use daily up to 1 week as needed., Disp: 30 g, Rfl: 1  Depression screen [facility] 2/9 06/21/2019 06/27/2017 06/16/2017  Decreased Interest 0 0 0  Down, Depressed, Hopeless 0 0 0  PHQ - 2 Score 0 0 0    No flowsheet data found.  -------------------------------------------------------------------------- O: No physical exam  performed due to remote telephone encounter.  Pictures viewed in email from patient.  Lab results reviewed.  I have personally reviewed the radiology report from Vascular Ultrasound ABI  on 07/25/18.  Study Result   LOWER EXTREMITY DOPPLER STUDY   Indications: Raynaud's disease, toe cyanosis, bilateral great toe pain,         hyperlipidemia          C/O toes hurting for about 81months now primarily when weather  is        cold.    Performing Technologist: Blondell Reveal RT, RDMS, RVT     Examination Guidelines: A complete evaluation includes at minimum, Doppler  waveform signals and systolic blood pressure reading at the level of  bilateral  brachial, anterior tibial, and posterior tibial arteries, when vessel  segments  are accessible. Bilateral testing is considered an integral part of a  complete  examination. Photoelectric Plethysmograph (PPG) waveforms and toe systolic  pressure readings are included as required and additional duplex testing  as  needed. Limited examinations for reoccurring indications may be performed  as  noted.     ABI Findings:  +---------+------------------+-----+---------+--------+  Right  Rt Pressure (mmHg)IndexWaveform Comment   +---------+------------------+-----+---------+--------+  Brachial 138                      +---------+------------------+-----+---------+--------+  ATA   146        1.06 triphasic      +---------+------------------+-----+---------+--------+  PTA   183        1.33 triphasic      +---------+------------------+-----+---------+--------+  Great Toe46        0.33 Abnormal       +---------+------------------+-----+---------+--------+   +---------+------------------+-----+------------+-------+  Left   Lt Pressure (mmHg)IndexWaveform  Comment  +---------+------------------+-----+------------+-------+  Brachial 133                       +---------+------------------+-----+------------+-------+  ATA   161        1.17 triphasic        +---------+------------------+-----+------------+-------+  PTA   150        1.09 triphasic         +---------+------------------+-----+------------+-------+  Great Toe0         0.00 not detected      +---------+------------------+-----+------------+-------+    TOES Findings:  +----------+---------------+-------------------+---------------------------  --+  Right ToesPressure (mmHg)Waveform (baseline)Waveform (post-warm  exposure)  +----------+---------------+-------------------+---------------------------  --+  1st Digit 46       severely dampened dampened             +----------+---------------+-------------------+---------------------------  --+  2nd Digit         dampened      mildly dampened          +----------+---------------+-------------------+---------------------------  --+  3rd Digit         dampened      mildly dampened          +----------+---------------+-------------------+---------------------------  --+  4th Digit         severely dampened severely dampened         +----------+---------------+-------------------+---------------------------  --+  5th Digit         severely dampened mildly dampened          +----------+---------------+-------------------+---------------------------  --+    +---------+---------------+-------------------+----------------------------  -+  Left ToesPressure (mmHg)Waveform (baseline)Waveform (post-warm  exposure)  +---------+---------------+-------------------+----------------------------  -+  1st Digit0       not detected    mildly dampened          +---------+---------------+-------------------+----------------------------  -+  2nd Digit        dampened      mildly dampened          +---------+---------------+-------------------+----------------------------  -+  3rd Digit        severely dampened severely dampened          +---------+---------------+-------------------+----------------------------  -+  4th Digit        severely dampened severely dampened         +---------+---------------+-------------------+----------------------------  -+  5th Digit        severely dampened dampened              +---------+---------------+-------------------+----------------------------  -+   Based on severely dampened/non-detectable baseline digital PPG waveforms  and a diagnosis of Raynaud's disease, digital PPG waveforms were repeated  following warm water exposure to the feet/toes.    Summary:  Right ABI/TBI: Resting right ankle-brachial index is within normal range.  No evidence of significant right lower extremity arterial disease. The  right toe-brachial index is abnormal.   Left ABI/TBI: Resting left ankle-brachial index is within normal range. No  evidence of significant left lower extremity arterial disease. Unable to  obtain left toe-brachial index due to non-detectable great toe flow.    Bilateral digital PPGs: Abnormal bilateral PPG waveforms are noted  throughout the digits with noticeable improvement of most of the waveforms  following warm exposure. These findings appear consistent with Raynaud's  disease as well as a possible small  vessel disease component.    *See table(s) above for measurements and observations.      Electronically signed by Leotis Pain MD on 07/27/2018 at 9:25:42 AM.       Final      No results found for this or any previous visit (from the past 2160 hour(s)).  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 Trial on Sildenafil PDE5 20mg  daily then can inc to 20mg  BID if needed, new rx sent to [facility] can use goodrx, if needed - Resubmit referral to Vascular [facility] see below, since not scheduled before - Also added topical Clotrimazole-betamethasone for dry skin dermatitis of feet, possible fungal component.  Orders Placed This Encounter  Procedures  . Ambulatory referral to Vascular Surgery    Referral Priority:   Routine    Referral Type:   Surgical    Referral Reason:   Specialty Services Required    Requested Specialty:   Vascular Surgery    Number of Visits Requested:   1     Meds ordered this encounter  Medications  . clotrimazole-betamethasone (LOTRISONE) cream    Sig: Apply 1-2 times a day for worsening flare dry skin dermatitis of toes/feet, may re-use daily up to 1 week as needed.    Dispense:  30 g    Refill:  1    Follow-up: - Return as needed  Patient verbalizes understanding with the above medical recommendations including the limitation of remote medical advice.  Specific follow-up and call-back criteria were  given for patient to follow-up or seek medical care more urgently if needed.   - Time spent in direct consultation with patient on phone: 11 minutes   Nobie Putnam, [facility] 06/21/2019, 2:05 PM

## 2019-07-11 ENCOUNTER — Encounter (INDEPENDENT_AMBULATORY_CARE_PROVIDER_SITE_OTHER): Payer: Medicare Other | Admitting: Vascular Surgery

## 2019-07-22 ENCOUNTER — Ambulatory Visit (INDEPENDENT_AMBULATORY_CARE_PROVIDER_SITE_OTHER): Payer: Medicare Other | Admitting: Vascular Surgery

## 2019-07-22 ENCOUNTER — Other Ambulatory Visit: Payer: Self-pay

## 2019-07-22 ENCOUNTER — Encounter (INDEPENDENT_AMBULATORY_CARE_PROVIDER_SITE_OTHER): Payer: Self-pay | Admitting: Vascular Surgery

## 2019-07-22 VITALS — BP 179/81 | HR 55 | Resp 16 | Ht 70.5 in | Wt 120.6 lb

## 2019-07-22 DIAGNOSIS — N289 Disorder of kidney and ureter, unspecified: Secondary | ICD-10-CM

## 2019-07-22 DIAGNOSIS — I7300 Raynaud's syndrome without gangrene: Secondary | ICD-10-CM | POA: Diagnosis not present

## 2019-07-22 DIAGNOSIS — E782 Mixed hyperlipidemia: Secondary | ICD-10-CM

## 2019-07-22 DIAGNOSIS — I70213 Atherosclerosis of native arteries of extremities with intermittent claudication, bilateral legs: Secondary | ICD-10-CM | POA: Diagnosis not present

## 2019-07-22 DIAGNOSIS — E785 Hyperlipidemia, unspecified: Secondary | ICD-10-CM

## 2019-07-22 DIAGNOSIS — I739 Peripheral vascular disease, unspecified: Secondary | ICD-10-CM

## 2019-07-22 DIAGNOSIS — F17200 Nicotine dependence, unspecified, uncomplicated: Secondary | ICD-10-CM

## 2019-07-22 DIAGNOSIS — I10 Essential (primary) hypertension: Secondary | ICD-10-CM

## 2019-07-22 HISTORY — DX: Essential (primary) hypertension: I10

## 2019-07-22 HISTORY — DX: Disorder of kidney and ureter, unspecified: N28.9

## 2019-07-22 HISTORY — DX: Peripheral vascular disease, unspecified: I73.9

## 2019-07-22 HISTORY — DX: Hyperlipidemia, unspecified: E78.5

## 2019-07-22 MED ORDER — AMLODIPINE BESYLATE 5 MG PO TABS: 5.0000 mg | ORAL_TABLET | Freq: Every day | ORAL | 11 refills | Status: DC

## 2019-07-22 NOTE — Progress Notes (Signed)
MRN : [id number]  Scott Oconnor is a 74 y.o. (May 05, 1946) male who presents with chief complaint of  Chief Complaint  Patient presents with  . New Patient (Initial Visit)    ref Karmalegos reynaulds w/o gangrene  .  History of Present Illness:   The patient is seen for the evaluation of painful fingers and toes associated with Raynaud's changes.  He notes his toes are much worse than his fingers.  The patient notes he has frequent episodes where the fingers and toes turned pale and then blue and become very painful. Exposure to cold environments makes the symptoms much worse.  He states he never had this problem when he was living in [place] and it is overwhelmingly worse in the winter here in [place].  The changes have been going on for years and seemed to be much worse lately. There is no history of trauma or repetitive injury. The patient does note some peeling of the skin of the of the toes on the soles and is increasingly concerned because the blue discoloration of the big toes never completely goes away.  He is currently an every day smoker  The patient has not been taking Norvasc.  There is no history of malignancy or autoimmune disease.  The patient denies amaurosis fugax or recent TIA symptoms. There are no recent neurological changes noted. The patient does describe some claudication symptoms but no history of rest pain rest pain symptoms. The patient denies history of DVT, PE or superficial thrombophlebitis. The patient denies recent episodes of angina or shortness of breath.    Current Meds  Medication Sig  . acetaminophen (TYLENOL) 500 MG tablet Take 1,000 mg by mouth at bedtime.   Marland Kitchen aspirin EC 81 MG tablet Take 81 mg by mouth daily.  . Multiple Vitamin (MULTIVITAMIN) capsule Take 1 capsule by mouth daily.  . sildenafil (REVATIO) 20 MG tablet Start with 1 tablet (20mg  dose) daily for Raynaud's problem with toes/fingers. If not improving symptoms can  increase to 1 tab twice a day, only take while cold out  . simvastatin (ZOCOR) 10 MG tablet TAKE 1 TABLET BY MOUTH DAILY    Past Medical History:  Diagnosis Date  . Hyperlipidemia     Past Surgical History:  Procedure Laterality Date  . NO PAST SURGERIES      Social History Social History   Tobacco Use  . Smoking status: Current Every Day Smoker    Packs/day: 0.33    Types: Cigarettes  . Smokeless tobacco: Never Used  . Tobacco comment: 6 cigarettes daily   Substance Use Topics  . Alcohol use: No  . Drug use: No    Family History Family History  Problem Relation Age of Onset  . Heart disease Father   . Sudden Cardiac Death Father   . Heart disease Brother   . Hyperlipidemia Brother   . Heart disease Sister   . Heart disease Brother   . Hyperlipidemia Brother   . Hyperlipidemia Brother   . Hyperlipidemia Brother   . Hyperlipidemia Brother   . Hyperlipidemia Brother   . Heart disease Sister   No family history of bleeding/clotting disorders, porphyria or autoimmune disease   No Known Allergies   REVIEW OF SYSTEMS (Negative unless checked)  Constitutional: [] Weight loss  [] Fever  [] Chills Cardiac: [] Chest pain   [] Chest pressure   [] Palpitations   [] Shortness of breath when laying flat   [] Shortness of breath with exertion. Vascular:  [x] Pain in legs with  walking   [x] Pain in legs at rest  [] History of DVT   [] Phlebitis   [] Swelling in legs   [] Varicose veins   [] Non-healing ulcers Pulmonary:   [] Uses home oxygen   [] Productive cough   [] Hemoptysis   [] Wheeze  [x] COPD   [] Asthma Neurologic:  [] Dizziness   [] Seizures   [] History of stroke   [] History of TIA  [] Aphasia   [] Vissual changes   [] Weakness or numbness in arm   [] Weakness or numbness in leg Musculoskeletal:   [] Joint swelling   [] Joint pain   [] Low back pain Hematologic:  [] Easy bruising  [] Easy bleeding   [] Hypercoagulable state   [] Anemic Gastrointestinal:  [] Diarrhea   [] Vomiting  [x] Gastroesophageal  reflux/heartburn   [] Difficulty swallowing. Genitourinary:  [] Chronic kidney disease   [] Difficult urination  [] Frequent urination   [] Blood in urine Skin:  [] Rashes   [] Ulcers  Psychological:  [] History of anxiety   []  History of major depression.  Physical Examination  Vitals:   07/22/19 1312  BP: (!) 179/81  Pulse: (!) 55  Resp: 16  Weight: 120 lb 9.6 oz (54.7 kg)  Height: 5' 10.5" (1.791 m)   Body mass index is 17.06 kg/m. Gen: WD/WN, NAD Head: [place]/AT, No temporalis wasting.  Ear/Nose/Throat: Hearing grossly intact, nares w/o erythema or drainage, poor dentition Eyes: PER, EOMI, sclera nonicteric.  Neck: Supple, no masses.  No bruit or JVD.  Pulmonary:  Good air movement, clear to auscultation bilaterally, no use of accessory muscles.  Cardiac: RRR, normal S1, S2, no Murmurs. Vascular: There is rather deep cyanosis of the toes bilaterally including what may be early fix changes of the great toes bilaterally.  There are no open wounds or sores and no evidence of infection.  There is very mild bluish discoloration of the fingers.  Popliteal pulses feel mildly enlarged right side greater than left Vessel Right Left  Radial Palpable Palpable  PT Not Palpable Not Palpable  DP Not Palpable Not Palpable  Gastrointestinal: soft, non-distended. No guarding/no peritoneal signs.  Musculoskeletal: M/S 5/5 throughout.  No deformity or atrophy.  Neurologic: CN 2-12 intact. Pain and light touch intact in extremities.  Symmetrical.  Speech is fluent. Motor exam as listed above. Psychiatric: Judgment intact, Mood & affect appropriate for pt's clinical situation. Dermatologic: No rashes or ulcers noted.  No changes consistent with cellulitis. Lymph : No Cervical lymphadenopathy, no lichenification or skin changes of chronic lymphedema.  CBC No results found for: WBC, HGB, HCT, MCV, PLT  BMET    Component Value Date/Time   NA 142 06/19/2018 0941   K 3.8 06/19/2018 0941   CL 104 06/19/2018  0941   CO2 32 06/19/2018 0941   GLUCOSE 114 (H) 06/19/2018 0941   BUN 14 06/19/2018 0941   CREATININE 1.30 (H) 06/19/2018 0941   CALCIUM 9.2 06/19/2018 0941   GFRNONAA 55 (L) 06/19/2018 0941   GFRAA 64 06/19/2018 0941   CrCl cannot be calculated (Patient's most recent lab result is older than the maximum 21 days allowed.).  COAG No results found for: INR, PROTIME  Radiology No results found.   Assessment/Plan 1. Atherosclerosis of native artery of both lower extremities with intermittent claudication (HCC) Recommend:  The patient has evidence of atherosclerotic changes of both lower extremities associated with severe Raynaud's changes.  He is describing rest pain that is associated with preulcerative changes and appears to have impending tissue loss of the foot.  This represents a limb threatening ischemia and places the patient at the risk  for limb loss.  Patient should undergo CT angiography of the lower extremities to better define his arterial status this will also exclude aneurysmal disease.  The risks and benefits as well as the alternative therapies was discussed in detail with the patient.  All questions were answered.  Patient agrees to proceed with CT angiography.  Smoking cessation was discussed in detail and the profoundly negative impact of nicotine on patients with Raynaud's.  He is urged to stop smoking.  He was warned against stopping using nicotine patches given the propensity for people to continue to smoke while using the exogenous nicotine consequently increasing their dose.  Chantix was described to the patient is a possibility.  He will consider these and I have asked for him to discuss the possibility of Chantix with his primary care.  The patient will follow up with me in the office after the CT scan.   - CT ANGIO AO+BIFEM W & OR WO CONTRAST; Future  2. Raynaud's disease without gangrene See #1  3. Mixed dyslipidemia Continue statin as ordered and reviewed,  no changes at this time   4. Tobacco use disorder Smoking cessation was discussed in detail and the profoundly negative impact of nicotine on patients with Raynaud's.  He is urged to stop smoking.  He was warned against stopping using nicotine patches given the propensity for people to continue to smoke while using the exogenous nicotine consequently increasing their dose.  Chantix was described to the patient is a possibility.  He will consider these and I have asked for him to discuss the possibility of Chantix with his primary care.   Hortencia Pilar, MD  07/22/2019 2:15 PM

## 2019-07-24 ENCOUNTER — Encounter (INDEPENDENT_AMBULATORY_CARE_PROVIDER_SITE_OTHER): Payer: Self-pay | Admitting: Vascular Surgery

## 2019-07-24 DIAGNOSIS — I739 Peripheral vascular disease, unspecified: Secondary | ICD-10-CM | POA: Insufficient documentation

## 2019-07-24 DIAGNOSIS — Z720 Tobacco use: Secondary | ICD-10-CM | POA: Insufficient documentation

## 2019-07-29 ENCOUNTER — Ambulatory Visit
Admission: RE | Admit: 2019-07-29 | Discharge: 2019-07-29 | Disposition: A | Discharge status: Home / Self Care | Payer: Medicare Other | Source: Ambulatory Visit | Attending: Vascular Surgery | Admitting: Vascular Surgery

## 2019-07-29 ENCOUNTER — Other Ambulatory Visit: Payer: Self-pay

## 2019-07-29 DIAGNOSIS — I70213 Atherosclerosis of native arteries of extremities with intermittent claudication, bilateral legs: Secondary | ICD-10-CM | POA: Insufficient documentation

## 2019-07-29 HISTORY — DX: Essential (primary) hypertension: I10

## 2019-07-29 LAB — POCT I-STAT CREATININE: Creatinine, Ser: 1.4 mg/dL — ABNORMAL HIGH (ref 0.61–1.24)

## 2019-07-29 MED ORDER — IOHEXOL 350 MG/ML SOLN
125.0000 mL | Freq: Once | INTRAVENOUS | Status: AC | PRN
  Administered 2019-07-29: 100 mL via INTRAVENOUS

## 2019-08-01 ENCOUNTER — Encounter (INDEPENDENT_AMBULATORY_CARE_PROVIDER_SITE_OTHER): Payer: Self-pay | Admitting: Vascular Surgery

## 2019-08-01 ENCOUNTER — Other Ambulatory Visit: Payer: Self-pay

## 2019-08-01 ENCOUNTER — Ambulatory Visit (INDEPENDENT_AMBULATORY_CARE_PROVIDER_SITE_OTHER): Payer: Medicare Other | Admitting: Vascular Surgery

## 2019-08-01 VITALS — BP 133/76 | HR 62 | Resp 10 | Ht 70.0 in | Wt 120.0 lb

## 2019-08-01 DIAGNOSIS — F17200 Nicotine dependence, unspecified, uncomplicated: Secondary | ICD-10-CM | POA: Diagnosis not present

## 2019-08-01 DIAGNOSIS — I714 Abdominal aortic aneurysm, without rupture, unspecified: Secondary | ICD-10-CM

## 2019-08-01 DIAGNOSIS — E782 Mixed hyperlipidemia: Secondary | ICD-10-CM | POA: Diagnosis not present

## 2019-08-01 DIAGNOSIS — I739 Peripheral vascular disease, unspecified: Secondary | ICD-10-CM

## 2019-08-01 DIAGNOSIS — I7300 Raynaud's syndrome without gangrene: Secondary | ICD-10-CM | POA: Diagnosis not present

## 2019-08-01 NOTE — Progress Notes (Signed)
MRN : [id number]  Scott Oconnor is a 74 y.o. ([birth year]) male who presents with chief complaint of  Chief Complaint  Patient presents with  . Follow-up    CT results  .  History of Present Illness:   The patient is seen for follow up evaluation of AAA status post CTA. There were no problems or complications related to the CT scan. The patient denies interval development of abdominal or back pain. No new lower extremity pain or discoloration of the toes.   The patient has a history of coronary artery disease, no recent episodes of angina or shortness of breath. The patient denies interval anaurosis fugax. There is o recent history of TIA symptoms or focal motor deficits. The patient denies PAD or claudication symptoms. There is a history of hyperlipidemia which is being treated with a statin.   CT scan is reviewed by me personally and additionally with  the patient and his wife CT angiography of the abdomen and pelvis shows an infrarenal AAA 5.0 cm.  No evidence of common femoral or popliteal artery aneurysms.  There is mild atherosclerotic changes but no hemodynamically significant strictures or stenoses are identified from the level of the aorta to the ankle.  Current Meds  Medication Sig  . acetaminophen (TYLENOL) 500 MG tablet Take 1,000 mg by mouth at bedtime.   Marland Kitchen amLODipine (NORVASC) 5 MG tablet Take 1 tablet (5 mg total) by mouth daily.  Marland Kitchen aspirin EC 81 MG tablet Take 81 mg by mouth daily.  . Multiple Vitamin (MULTIVITAMIN) capsule Take 1 capsule by mouth daily.  . simvastatin (ZOCOR) 10 MG tablet TAKE 1 TABLET BY MOUTH DAILY    Past Medical History:  Diagnosis Date  . Hyperlipidemia   . Hypertension     Past Surgical History:  Procedure Laterality Date  . NO PAST SURGERIES      Social History Social History   Tobacco Use  . Smoking status: Current Every Day Smoker    Packs/day: 0.33    Types: Cigarettes  . Smokeless tobacco: Never Used  . Tobacco comment:  6 cigarettes daily   Substance Use Topics  . Alcohol use: No  . Drug use: No    Family History Family History  Problem Relation Age of Onset  . Heart disease Father   . Sudden Cardiac Death Father   . Heart disease Brother   . Hyperlipidemia Brother   . Heart disease Sister   . Heart disease Brother   . Hyperlipidemia Brother   . Hyperlipidemia Brother   . Hyperlipidemia Brother   . Hyperlipidemia Brother   . Hyperlipidemia Brother   . Heart disease Sister     No Known Allergies   REVIEW OF SYSTEMS (Negative unless checked)  Constitutional: [] Weight loss  [] Fever  [] Chills Cardiac: [] Chest pain   [] Chest pressure   [] Palpitations   [] Shortness of breath when laying flat   [] Shortness of breath with exertion. Vascular:  [] Pain in legs with walking   [] Pain in legs at rest  [] History of DVT   [] Phlebitis   [] Swelling in legs   [] Varicose veins   [] Non-healing ulcers Pulmonary:   [] Uses home oxygen   [] Productive cough   [] Hemoptysis   [] Wheeze  [] COPD   [] Asthma Neurologic:  [] Dizziness   [] Seizures   [] History of stroke   [] History of TIA  [] Aphasia   [] Vissual changes   [] Weakness or numbness in arm   [] Weakness or numbness in leg Musculoskeletal:   [] Joint  swelling   [] Joint pain   [] Low back pain Hematologic:  [] Easy bruising  [] Easy bleeding   [] Hypercoagulable state   [] Anemic Gastrointestinal:  [] Diarrhea   [] Vomiting  [] Gastroesophageal reflux/heartburn   [] Difficulty swallowing. Genitourinary:  [] Chronic kidney disease   [] Difficult urination  [] Frequent urination   [] Blood in urine Skin:  [] Rashes   [] Ulcers  Psychological:  [] History of anxiety   []  History of major depression.  Physical Examination  Vitals:   08/01/19 1425  BP: 133/76  Pulse: 62  Resp: 10  Weight: 120 lb (54.4 kg)  Height: 5\' 10"  (1.778 m)   Body mass index is 17.22 kg/m. Gen: WD/WN, NAD Head: [place]/AT, No temporalis wasting.  Ear/Nose/Throat: Hearing grossly intact, nares w/o erythema or  drainage Eyes: PER, EOMI, sclera nonicteric.  Neck: Supple, no large masses.   Pulmonary:  Good air movement, no audible wheezing bilaterally, no use of accessory muscles.  Cardiac: RRR, no JVD Vascular: Pulsatile mass noted in the abdomen toes with cyanotic changes Vessel Right Left  Radial Palpable Palpable  Gastrointestinal: Non-distended. No guarding/no peritoneal signs.  Musculoskeletal: M/S 5/5 throughout.  No deformity or atrophy.  Neurologic: CN 2-12 intact. Symmetrical.  Speech is fluent. Motor exam as listed above. Psychiatric: Judgment intact, Mood & affect appropriate for pt's clinical situation. Dermatologic: No rashes or ulcers noted.  No changes consistent with cellulitis.  CBC No results found for: WBC, HGB, HCT, MCV, PLT  BMET    Component Value Date/Time   NA 142 06/19/2018 0941   K 3.8 06/19/2018 0941   CL 104 06/19/2018 0941   CO2 32 06/19/2018 0941   GLUCOSE 114 (H) 06/19/2018 0941   BUN 14 06/19/2018 0941   CREATININE 1.40 (H) 07/29/2019 0910   CREATININE 1.30 (H) 06/19/2018 0941   CALCIUM 9.2 06/19/2018 0941   GFRNONAA 55 (L) 06/19/2018 0941   GFRAA 64 06/19/2018 0941   Estimated Creatinine Clearance: 36.7 mL/min (A) (by C-G formula based on SCr of 1.4 mg/dL (H)).  COAG No results found for: INR, PROTIME  Radiology CT ANGIO AO+BIFEM W & OR WO CONTRAST  Result Date: 07/29/2019 CLINICAL DATA:  74 year old male with sensory changes lower extremities EXAM: CT ANGIOGRAPHY OF ABDOMINAL AORTA WITH ILIOFEMORAL RUNOFF TECHNIQUE: Multidetector CT imaging of the abdomen, pelvis and lower extremities was performed using the standard protocol during bolus administration of intravenous contrast. Multiplanar CT image reconstructions and MIPs were obtained to evaluate the vascular anatomy. CONTRAST:  163mL OMNIPAQUE IOHEXOL 350 MG/ML SOLN COMPARISON:  None. FINDINGS: VASCULAR Aorta: Atherosclerotic changes of the lower thoracic aorta. Diameter of the thoracic aorta at  the hiatus measures 2 cm. Juxtarenal aorta measures 16 mm. Infrarenal abdominal aortic aneurysm with the greatest diameter estimated 4.6 cm on image 77 of series 7. Circumferential soft plaque/mural thrombus with maintained flow lumen. Celiac: Narrowing of the celiac artery at the origin on the sagittal reformatted images, potentially from soft atherosclerotic plaque and/or overlying diaphragmatic crus. SMA: Patent, with no significant atherosclerotic changes. Renals: Single right renal artery. No significant atherosclerotic changes at the origin. Main left renal artery demonstrates no significant atherosclerotic changes at the origin. There is a small accessory left renal artery to the lower pole segments. IMA: Inferior mesenteric artery is patent. Right lower extremity: Mild to moderate atherosclerotic changes of the right iliac system. The outer wall of the right iliac artery is not well visualized, however, greatest diameter of the common iliac artery is estimated 18 mm. Hypogastric artery is patent. External iliac artery patent  without high-grade stenosis or occlusion. Common femoral artery with no significant anterior wall calcification and minimal atherosclerosis. Profunda femoris and thigh branches are patent. Superficial femoral artery is patent without significant atherosclerotic changes. Popliteal artery patent without significant atherosclerosis. Anterior tibial artery patent from the origin to the ankle with minimal atherosclerotic change. Tibioperoneal trunk is patent with calcifications distally. Peroneal artery patent from the origin to the ankle. Posterior tibial artery patent from the origin to the ankle. Left lower extremity: Mild to moderate atherosclerotic changes of the left iliac system with no high-grade stenosis or occlusion. Greatest estimated diameter of the common iliac artery 9 mm. Hypogastric artery is patent. External iliac artery patent. Common femoral artery with minimal  atherosclerosis and no anterior wall calcifications. Profunda femoris is patent as well as the thigh branches. Superficial femoral artery with minimal atherosclerosis and is patent with no stenosis or occlusion. Popliteal artery patent with minimal atherosclerosis and no aneurysm. Anterior tibial artery is patent from the origin to the ankle. Tibioperoneal trunk is patent with atherosclerotic changes. Posterior tibial artery is patent at the origin though occludes proximally with reconstitution at the ankle. Peroneal artery is patent proximally with decreased attenuation distally. Veins: Unremarkable appearance of the venous system. Review of the MIP images confirms the above findings. NON-VASCULAR Lower chest: No acute. Hepatobiliary: Unremarkable appearance of the liver. Unremarkable gall bladder. Pancreas: Unremarkable. Spleen: Unremarkable. Adrenals/Urinary Tract: Unremarkable appearance of adrenal glands. Right: No hydronephrosis. Symmetric perfusion to the left. No nephrolithiasis. Unremarkable course of the right ureter. Left: No hydronephrosis. Symmetric perfusion to the right. No nephrolithiasis. Unremarkable course of the left ureter. Unremarkable appearance of the urinary bladder . Stomach/Bowel: Unremarkable appearance of the stomach. Unremarkable appearance of small bowel. No evidence of obstruction. Appendix is not visualized, however, no inflammatory changes are present adjacent to the cecum to indicate an appendicitis. Moderate stool burden. No focal inflammatory changes of the colon. No distention. Lymphatic: No adenopathy. Mesenteric: No free fluid or air. No mesenteric adenopathy. Reproductive: Transverse diameter of the prostate measures 4.4 cm Other: No hernia. Musculoskeletal: No evidence of acute fracture. No bony canal narrowing. No significant degenerative changes of the hips. IMPRESSION: Infrarenal abdominal aortic aneurysm, estimated 4.6 cm on the axial images. Aortic aneurysm NOS  (ICD10-I71.9). Aortic Atherosclerosis (ICD10-I70.0). Right common iliac artery is estimated 18 mm in diameter, although the outer wall is not well visualized. 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 No significant femoropopliteal disease, with mild right-sided tibial disease. On the left there is occlusion of the posterior tibial artery proximally and questionable distal occlusion of the peroneal artery. Ancillary findings as above. Signed, Dulcy Fanny. Dellia Nims, RPVI [facility] Radiology Electronically Signed   By: Corrie Mckusick D.O.   On: 07/29/2019 12:01     Assessment/Plan 1. AAA (abdominal aortic aneurysm) without rupture ([place]) Recommend: The aneurysm is > 5 cm and therefore should undergo repair. Patient is status post CT scan of the abdominal aorta. The patient is a candidate for endovascular repair.   He will require cardiac clearance.   The patient will continue antiplatelet therapy as prescribed (since the patient is undergoing endovascular repair as opposed to open repair) as well as aggressive management of hyperlipidemia. Exercise is again strongly encouraged.   The patient is reminded that lifetime routine surveillance is a necessity with an endograft.   The risks and benefits of AAA repair are reviewed with the patient.  All questions are answered.  Alternative therapies are also discussed.  The patient agrees to proceed with endovascular aneurysm repair.  Patient will follow-up with me in the office after the surgery. - Ambulatory referral to Cardiology  2. Raynaud's disease without gangrene The patient's Raynaud's is better and her symptoms are stable.  Behavioral modification was stressed; avoidance of cold and utilizing wool socks was reviewed again.  The reported BP today was above the AHA goal and therefore there will continue Norvasc.  Norvasc 5 mg po daily is prescribed  The patient will follow up in PRN,  sooner if there are problems.   3. Mixed dyslipidemia Continue statin as ordered and reviewed, no changes at this time   4. Tobacco use disorder Smoking cessation was again urged - Ambulatory referral to Cardiology  5. PAD (peripheral artery disease) (HCC) Recommend:  I do not find evidence of life style limiting vascular disease. The patient specifically denies life style limitation.  Previous noninvasive studies including ABI's of the legs do not identify critical vascular problems.  The patient should continue walking and begin a more formal exercise program. The patient should continue his antiplatelet therapy and aggressive treatment of the lipid abnormalities.     Hortencia Pilar, MD  08/01/2019 3:06 PM

## 2019-08-01 NOTE — Progress Notes (Signed)
Cardiology Office Note  Date:  08/02/2019   ID:  Scott, Oconnor 03-08-1946, MRN [id number]  PCP:  Mikey College, NP (Inactive)   Chief Complaint  Patient presents with  . New Patient (Initial Visit)    Establishing care- Referral from PCP. Evaluation of 5 cm AAA.    HPI:  Scott Oconnor is a 74 y.o. (1945-11-21) male with past medical history of 5 cm AAA Hyperlipidemia, hypertension Smoker Raynaud's disease without gangrene Smoker Borderline diabetes Who presents by referral from Dr. Ronalee Belts for preop cardiovascular evaluation for 5 cm AAA  Notes from vascular reviewed, He had a CT scan performed for decreased sensation in his toes, discoloration felt secondary to Raynaud's CT scan results below  felt to be a good candidate for endovascular aneurysm repair   CT angiography of the abdomen and pelvis shows an infrarenal AAA 5.0 cm.  No evidence of common femoral or popliteal artery aneurysms.  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  PSH:    Past Surgical History:  Procedure Laterality Date  . NO PAST SURGERIES      Current  Outpatient Medications  Medication Sig Dispense Refill  . acetaminophen (TYLENOL) 500 MG tablet Take 1,000 mg by mouth at bedtime.     Marland Kitchen amLODipine (NORVASC) 5 MG tablet Take 1 tablet (5 mg total) by mouth daily. 30 tablet 11  . aspirin EC 81 MG tablet Take 81 mg by mouth daily.    . Multiple Vitamin (MULTIVITAMIN) capsule Take 1 capsule by mouth daily.    . sildenafil (REVATIO) 20 MG tablet Start with 1 tablet (20mg  dose) daily for Raynaud's problem with toes/fingers. If not improving symptoms can increase to 1 tab twice a day, only take while cold out 60 tablet 3  . simvastatin (ZOCOR) 10 MG tablet TAKE 1 TABLET BY MOUTH DAILY 90 tablet 4   No current facility-administered medications for this visit.    Allergies:   Patient has no known allergies.   Social History:  The patient  reports that he has been smoking cigarettes. He has been smoking about 0.33 packs per day. He has never used smokeless tobacco. He reports that he does not drink alcohol or use drugs.   Family History:   family history includes Heart disease in his brother, brother, father, sister, and sister; Hyperlipidemia in his brother, brother, brother, brother, brother, and brother; Sudden Cardiac Death in his father.    Review of Systems: Review of Systems  Constitutional: Negative.   HENT: Negative.   Respiratory: Negative.   Cardiovascular: Negative.   Gastrointestinal: Negative.   Musculoskeletal: Negative.   Neurological: Negative.  Psychiatric/Behavioral: Negative.   All other systems reviewed and are negative.   PHYSICAL EXAM: VS:  BP 134/60 (BP Location: Left Arm, Patient Position: Sitting, Cuff Size: Normal)   Pulse 62   Ht 5\' 10"  (1.778 m)   Wt 119 lb 3.2 oz (54.1 kg)   BMI 17.10 kg/m  , BMI Body mass index is 17.1 kg/m. GEN: Well nourished, well developed, in no acute distress HEENT: normal Neck: no JVD, carotid bruits, or masses Cardiac: RRR; no murmurs, rubs, or gallops,no edema  Respiratory:   clear to auscultation bilaterally, normal work of breathing GI: soft, nontender, nondistended, + BS MS: no deformity or atrophy Skin: warm and dry, no rash Neuro:  Strength and sensation are intact Psych: euthymic mood, full affect   Recent Labs: 07/29/2019: Creatinine, Ser 1.40    Lipid Panel Lab Results  Component Value Date   CHOL 138 06/19/2018   HDL 44 06/19/2018   LDLCALC 74 06/19/2018   TRIG 121 06/19/2018      Wt Readings from Last 3 Encounters:  08/02/19 119 lb 3.2 oz (54.1 kg)  08/01/19 120 lb (54.4 kg)  07/22/19 120 lb 9.6 oz (54.7 kg)     ASSESSMENT AND PLAN:  Problem List Items Addressed This Visit      Cardiology Problems   PAD (peripheral artery disease) ([place])   AAA (abdominal aortic aneurysm) without rupture ([place]) - Primary    Other Visit Diagnoses    Mixed hyperlipidemia       Essential hypertension       Smoker         Preop cardiovascular evaluation Acceptable risk for AAA repair Good exercise tolerance, no known cardiac disease Regular biking and outdoor activities walking with no angina No EKG changes, essentially normal exam We will see him on a regular basis in the clinic for risk factor modification  AAA/PAD Stressed importance of smoking cessation Discussed with his daughter who is here on today's visit Recommend he stop simvastatin, start Crestor 20 mg daily --Acceptable risk for endovascular graft repair Images concerning also for iliac disease On aspirin  Hyperlipidemia Push for lower number, hold simvastatin, start Crestor 20 (Slow titration up will start 10 for several weeks then up to 20 if tolerated)  Disposition:   F/U  12 months  Long discussion with him concerning AAA findings, PAD in general, risk factor modification, smoking cessation techniques  Total encounter time more than 60 minutes  Greater than 50% was spent in counseling and coordination of care with the patient    Signed, Esmond Plants, M.D., Ph.D. [place], [place]

## 2019-08-02 ENCOUNTER — Encounter: Payer: Self-pay | Admitting: Cardiovascular Disease

## 2019-08-02 ENCOUNTER — Ambulatory Visit (INDEPENDENT_AMBULATORY_CARE_PROVIDER_SITE_OTHER): Payer: Medicare Other | Admitting: Cardiovascular Disease

## 2019-08-02 VITALS — BP 134/60 | HR 62 | Ht 70.0 in | Wt 119.2 lb

## 2019-08-02 DIAGNOSIS — E782 Mixed hyperlipidemia: Secondary | ICD-10-CM

## 2019-08-02 DIAGNOSIS — I739 Peripheral vascular disease, unspecified: Secondary | ICD-10-CM

## 2019-08-02 DIAGNOSIS — I714 Abdominal aortic aneurysm, without rupture, unspecified: Secondary | ICD-10-CM

## 2019-08-02 DIAGNOSIS — F17200 Nicotine dependence, unspecified, uncomplicated: Secondary | ICD-10-CM

## 2019-08-02 DIAGNOSIS — I10 Essential (primary) hypertension: Secondary | ICD-10-CM | POA: Diagnosis not present

## 2019-08-02 MED ORDER — ROSUVASTATIN CALCIUM 20 MG PO TABS: 20.0000 mg | ORAL_TABLET | Freq: Every day | ORAL | 3 refills | Status: DC

## 2019-08-02 NOTE — Patient Instructions (Addendum)
Medication Instructions: Your physician has recommended you make the following change in your medication:  1. STOP Simvastatin 2. START Rosuvastatin 20 mg once daily (Ok to take 1/2 pill for first month, then increase to 1 whole pill once daily.    If you need a refill on your cardiac medications before your next appointment, please call your pharmacy.    Lab work: No new labs needed   If you have labs (blood work) drawn today and your tests are completely normal, you will receive your results only by: Marland Kitchen MyChart Message (if you have MyChart) OR . A paper copy in the mail If you have any lab test that is abnormal or we need to change your treatment, we will call you to review the results.   Testing/Procedures: No new testing needed   Follow-Up: At [facility], you and your health needs are our priority.  As part of our continuing mission to provide you with exceptional heart care, we have created designated Provider Care Teams.  These Care Teams include your primary Cardiologist (physician) and Advanced Practice Providers (APPs -  Physician Assistants and Nurse Practitioners) who all work together to provide you with the care you need, when you need it.  . You will need a follow up appointment in 12 months   . Providers on your designated Care Team:   . Murray Hodgkins, NP . Christell Faith, PA-C . Marrianne Mood, PA-C  Any Other Special Instructions Will Be Listed Below (If Applicable).  For educational health videos Log in to : [email or web address] Or : [email or web address], password : [email or web address]

## 2019-08-05 ENCOUNTER — Telehealth (INDEPENDENT_AMBULATORY_CARE_PROVIDER_SITE_OTHER): Payer: Self-pay

## 2019-08-05 NOTE — Telephone Encounter (Signed)
Spoke with the patient's daughter and he is now scheduled with Dr. Delana Meyer for a Endovascular AAA repair on 08/14/19. Patient will do a phone call pre-op on 08/08/19 between 8-1 pm and covid testing on 08/12/19 between 12:30-2: 30 pm at the [facility]. Pre-surgical instructions were discussed and will be mailed to the patient.

## 2019-08-07 ENCOUNTER — Other Ambulatory Visit (INDEPENDENT_AMBULATORY_CARE_PROVIDER_SITE_OTHER): Payer: Self-pay | Admitting: Nurse Practitioner

## 2019-08-08 ENCOUNTER — Encounter
Admission: RE | Admit: 2019-08-08 | Discharge: 2019-08-08 | Disposition: A | Discharge status: Home / Self Care | Payer: Medicare Other | Source: Ambulatory Visit | Attending: Vascular Surgery | Admitting: Vascular Surgery

## 2019-08-08 ENCOUNTER — Other Ambulatory Visit: Payer: Self-pay

## 2019-08-08 HISTORY — DX: Localized scleroderma (morphea): L94.0

## 2019-08-08 NOTE — Patient Instructions (Signed)
Your procedure is scheduled on: August 14, 2019 Wednesday  Report to Day Surgery on the 2nd floor of the [facility]. To find out your arrival time, please call [phone] between 1PM - 3PM on: August 13, 2019 Tuesday   REMEMBER: Instructions that are not followed completely may result in serious medical risk, up to and including death; or upon the discretion of your surgeon and anesthesiologist your surgery may need to be rescheduled.  Do not eat food after midnight the night before surgery.  No gum chewing, lozengers or hard candies.  You may however, drink CLEAR liquids up to 2 hours before you are scheduled to arrive for your surgery. Do not drink anything within 2 hours of the start of your surgery.  Clear liquids include: - water  - apple juice without pulp - gatorade (not RED) - black coffee or tea (Do NOT add milk or creamers to the coffee or tea) Do NOT drink anything that is not on this list.  Type 1 and Type 2 diabetics should only drink water.   TAKE THESE MEDICATIONS THE MORNING OF SURGERY WITH A SIP OF WATER: NONE  Follow recommendations from Cardiologist, Pulmonologist or PCP regarding stopping Aspirin, Coumadin, Plavix, Eliquis, Pradaxa, or Pletal.  Stop Anti-inflammatories (NSAIDS) such as Advil, Aleve, Ibuprofen, Motrin, Naproxen, Naprosyn and Aspirin based products such as Excedrin, Goodys Powder, BC Powder. (May take Tylenol or Acetaminophen if needed.)  Stop ANY OVER THE COUNTER supplements until after surgery. (May continue Vitamin D, Vitamin B, and multivitamin.)  No Alcohol for 24 hours before or after surgery.  No Smoking including e-cigarettes for 24 hours prior to surgery.  No chewable tobacco products for at least 6 hours prior to surgery.  No nicotine patches on the day of surgery.  On the morning of surgery brush your teeth with toothpaste and water, you may rinse your mouth with mouthwash if you wish. Do not swallow any toothpaste or  mouthwash.  Do not wear jewelry, make-up, hairpins, clips or nail polish.  Do not wear lotions, powders, or perfumes, DEODORANT OR AFTERSHAVE  Do not shave 48 hours prior to surgery.   Contact lenses, hearing aids and dentures may not be worn into surgery.  Do not bring valuables to the hospital, including drivers license, insurance or credit cards.  [facility] is not responsible for any belongings or valuables.   Use CHG Soap  as directed on instruction sheet.  Notify your doctor if there is any change in your medical condition (cold, fever, infection).  Wear comfortable clothing (specific to your surgery type) to the hospital.  Plan for stool softeners for home use.  If you are being admitted to the hospital overnight, [place] After surgery it may be brought to your room.  If you are being discharged the day of surgery, you will not be allowed to drive home. You will need a responsible adult to drive you home and stay with you that night.   If you are taking public transportation, you will need to have a responsible adult with you. Please confirm with your physician that it is acceptable to use public transportation.   Please call [phone] if you have any questions about these instructions.  Visitation Policy:  Patients undergoing a surgery or procedure in a hospital may have one family member or support person with them as long as that person is not COVID-19 positive or experiencing its symptoms. That person may remain in  the waiting area during the procedure. Should the patient need to stay at the hospital during part of their recovery, the support person may visit during visiting hours; 10 am to 8 pm.  Children under 42 years of age may have both parents or legal guardians with them during their hospital stay.

## 2019-08-12 ENCOUNTER — Other Ambulatory Visit
Admission: RE | Admit: 2019-08-12 | Discharge: 2019-08-12 | Disposition: A | Discharge status: Home / Self Care | Payer: Medicare Other | Source: Ambulatory Visit | Attending: Vascular Surgery | Admitting: Vascular Surgery

## 2019-08-12 ENCOUNTER — Other Ambulatory Visit: Payer: Self-pay

## 2019-08-12 LAB — BASIC METABOLIC PANEL
Anion gap: 9 (ref 5–15)
BUN: 19 mg/dL (ref 8–23)
CO2: 29 mmol/L (ref 22–32)
Calcium: 8.9 mg/dL (ref 8.9–10.3)
Chloride: 103 mmol/L (ref 98–111)
Creatinine, Ser: 1.28 mg/dL — ABNORMAL HIGH (ref 0.61–1.24)
GFR calc Af Amer: >60 mL/min (ref >60)
GFR calc non Af Amer: 56 mL/min — ABNORMAL LOW (ref >60)
Glucose, Bld: 167 mg/dL — ABNORMAL HIGH (ref 70–99)
Potassium: 3.6 mmol/L (ref 3.5–5.1)
Sodium: 141 mmol/L (ref 135–145)

## 2019-08-12 LAB — CBC WITH DIFFERENTIAL/PLATELET
Abs Immature Granulocytes: 0.01 10*3/uL (ref 0.00–0.07)
Basophils Absolute: 0.1 10*3/uL (ref 0.0–0.1)
Basophils Relative: 1 %
Eosinophils Absolute: 0.3 10*3/uL (ref 0.0–0.5)
Eosinophils Relative: 6 %
HCT: 42 % (ref 39.0–52.0)
Hemoglobin: 14.2 g/dL (ref 13.0–17.0)
Immature Granulocytes: 0 %
Lymphocytes Relative: 24 %
Lymphs Abs: 1.2 10*3/uL (ref 0.7–4.0)
MCH: 31.1 pg (ref 26.0–34.0)
MCHC: 33.8 g/dL (ref 30.0–36.0)
MCV: 92.1 fL (ref 80.0–100.0)
Monocytes Absolute: 0.5 10*3/uL (ref 0.1–1.0)
Monocytes Relative: 10 %
Neutro Abs: 2.8 10*3/uL (ref 1.7–7.7)
Neutrophils Relative %: 59 %
Platelets: 149 10*3/uL — ABNORMAL LOW (ref 150–400)
RBC: 4.56 MIL/uL (ref 4.22–5.81)
RDW: 14.6 % (ref 11.5–15.5)
WBC: 4.8 10*3/uL (ref 4.0–10.5)
nRBC: 0 % (ref 0.0–0.2)

## 2019-08-12 LAB — TYPE AND SCREEN
ABO/RH(D): A POS
Antibody Screen: NEGATIVE

## 2019-08-12 LAB — APTT: aPTT: 40 seconds — ABNORMAL HIGH (ref 24–36)

## 2019-08-13 LAB — SARS CORONAVIRUS 2 (TAT 6-24 HRS): SARS Coronavirus 2: NEGATIVE

## 2019-08-14 ENCOUNTER — Encounter
Admission: RE | Disposition: A | Discharge status: Home / Self Care | Payer: Self-pay | Source: Home / Self Care | Attending: Vascular Surgery

## 2019-08-14 ENCOUNTER — Encounter: Payer: Self-pay | Admitting: Vascular Surgery

## 2019-08-14 ENCOUNTER — Inpatient Hospital Stay: Payer: Medicare Other

## 2019-08-14 ENCOUNTER — Other Ambulatory Visit: Payer: Self-pay

## 2019-08-14 ENCOUNTER — Inpatient Hospital Stay
Admission: RE | Admit: 2019-08-14 | Discharge: 2019-08-15 | DRG: 269 | Disposition: A | Discharge status: Home / Self Care | Payer: Medicare Other | Attending: Vascular Surgery | Admitting: Vascular Surgery

## 2019-08-14 DIAGNOSIS — I714 Abdominal aortic aneurysm, without rupture, unspecified: Secondary | ICD-10-CM

## 2019-08-14 DIAGNOSIS — Z8249 Family history of ischemic heart disease and other diseases of the circulatory system: Secondary | ICD-10-CM | POA: Diagnosis not present

## 2019-08-14 DIAGNOSIS — I7300 Raynaud's syndrome without gangrene: Secondary | ICD-10-CM | POA: Diagnosis present

## 2019-08-14 DIAGNOSIS — Z8241 Family history of sudden cardiac death: Secondary | ICD-10-CM

## 2019-08-14 DIAGNOSIS — I10 Essential (primary) hypertension: Secondary | ICD-10-CM | POA: Diagnosis present

## 2019-08-14 DIAGNOSIS — F17200 Nicotine dependence, unspecified, uncomplicated: Secondary | ICD-10-CM | POA: Diagnosis present

## 2019-08-14 DIAGNOSIS — Z83438 Family history of other disorder of lipoprotein metabolism and other lipidemia: Secondary | ICD-10-CM | POA: Diagnosis not present

## 2019-08-14 DIAGNOSIS — Z20822 Contact with and (suspected) exposure to covid-19: Secondary | ICD-10-CM | POA: Diagnosis present

## 2019-08-14 DIAGNOSIS — F17210 Nicotine dependence, cigarettes, uncomplicated: Secondary | ICD-10-CM | POA: Diagnosis present

## 2019-08-14 DIAGNOSIS — E782 Mixed hyperlipidemia: Secondary | ICD-10-CM | POA: Diagnosis present

## 2019-08-14 DIAGNOSIS — I739 Peripheral vascular disease, unspecified: Secondary | ICD-10-CM | POA: Diagnosis present

## 2019-08-14 HISTORY — PX: ENDOVASCULAR REPAIR/STENT GRAFT: CATH118280

## 2019-08-14 LAB — PROTIME-INR
INR: 1 (ref 0.8–1.2)
Prothrombin Time: 13.3 seconds (ref 11.4–15.2)

## 2019-08-14 LAB — MRSA PCR SCREENING: MRSA by PCR: NEGATIVE

## 2019-08-14 LAB — GLUCOSE, CAPILLARY: Glucose-Capillary: 96 mg/dL (ref 70–99)

## 2019-08-14 SURGERY — ENDOVASCULAR REPAIR/STENT GRAFT
Anesthesia: General

## 2019-08-14 SURGERY — ENDOVASCULAR STENT GRAFT (AAA)
Anesthesia: General

## 2019-08-14 MED ORDER — CEFAZOLIN SODIUM-DEXTROSE 2-4 GM/100ML-% IV SOLN
INTRAVENOUS | Status: AC
  Filled 2019-08-14: qty 100

## 2019-08-14 MED ORDER — PROPOFOL 10 MG/ML IV BOLUS
INTRAVENOUS | Status: AC
  Filled 2019-08-14: qty 20

## 2019-08-14 MED ORDER — FENTANYL CITRATE (PF) 100 MCG/2ML IJ SOLN
INTRAMUSCULAR | Status: AC
  Filled 2019-08-14: qty 2

## 2019-08-14 MED ORDER — CHLORHEXIDINE GLUCONATE CLOTH 2 % EX PADS: 6.0000 | MEDICATED_PAD | Freq: Every day | CUTANEOUS | Status: DC

## 2019-08-14 MED ORDER — ROCURONIUM BROMIDE 100 MG/10ML IV SOLN
INTRAVENOUS | Status: DC | PRN
  Administered 2019-08-14: 30 mg via INTRAVENOUS

## 2019-08-14 MED ORDER — MAGNESIUM SULFATE 2 GM/50ML IV SOLN: 2.0000 g | Freq: Every day | INTRAVENOUS | Status: DC | PRN

## 2019-08-14 MED ORDER — ONDANSETRON HCL 4 MG/2ML IJ SOLN
INTRAMUSCULAR | Status: AC
  Filled 2019-08-14: qty 2

## 2019-08-14 MED ORDER — DOCUSATE SODIUM 100 MG PO CAPS
100.0000 mg | ORAL_CAPSULE | Freq: Every day | ORAL | Status: DC
  Administered 2019-08-15: 100 mg via ORAL
  Filled 2019-08-14: qty 1

## 2019-08-14 MED ORDER — LACTATED RINGERS IV SOLN: INTRAVENOUS | Status: DC

## 2019-08-14 MED ORDER — POTASSIUM CHLORIDE CRYS ER 20 MEQ PO TBCR: 20.0000 meq | EXTENDED_RELEASE_TABLET | Freq: Every day | ORAL | Status: DC | PRN

## 2019-08-14 MED ORDER — ASPIRIN EC 81 MG PO TBEC
81.0000 mg | DELAYED_RELEASE_TABLET | Freq: Every day | ORAL | Status: DC
  Administered 2019-08-14: 81 mg via ORAL
  Filled 2019-08-14: qty 1

## 2019-08-14 MED ORDER — HYDRALAZINE HCL 20 MG/ML IJ SOLN: 5.0000 mg | INTRAMUSCULAR | Status: DC | PRN

## 2019-08-14 MED ORDER — SODIUM CHLORIDE 0.9 % IV SOLN: INTRAVENOUS | Status: AC

## 2019-08-14 MED ORDER — MIDAZOLAM HCL 2 MG/2ML IJ SOLN
INTRAMUSCULAR | Status: DC | PRN
  Administered 2019-08-14: 2 mg via INTRAVENOUS

## 2019-08-14 MED ORDER — EPHEDRINE SULFATE 50 MG/ML IJ SOLN
INTRAMUSCULAR | Status: DC | PRN
  Administered 2019-08-14: 5 mg via INTRAVENOUS

## 2019-08-14 MED ORDER — ADULT MULTIVITAMIN W/MINERALS CH
1.0000 | ORAL_TABLET | Freq: Every day | ORAL | Status: DC
  Administered 2019-08-14 – 2019-08-15 (×2): 1 via ORAL
  Filled 2019-08-14 (×2): qty 1

## 2019-08-14 MED ORDER — METOPROLOL TARTRATE 5 MG/5ML IV SOLN: 2.0000 mg | INTRAVENOUS | Status: DC | PRN

## 2019-08-14 MED ORDER — ONDANSETRON HCL 4 MG/2ML IJ SOLN: 4.0000 mg | Freq: Four times a day (QID) | INTRAMUSCULAR | Status: DC | PRN

## 2019-08-14 MED ORDER — NITROGLYCERIN IN D5W 200-5 MCG/ML-% IV SOLN: 5.0000 ug/min | INTRAVENOUS | Status: DC

## 2019-08-14 MED ORDER — GLYCOPYRROLATE 0.2 MG/ML IJ SOLN
INTRAMUSCULAR | Status: AC
  Administered 2019-08-14: 0.2 mg via INTRAVENOUS
  Filled 2019-08-14: qty 1

## 2019-08-14 MED ORDER — FAMOTIDINE IN NACL 20-0.9 MG/50ML-% IV SOLN
20.0000 mg | Freq: Two times a day (BID) | INTRAVENOUS | Status: DC
  Administered 2019-08-14 – 2019-08-15 (×3): 20 mg via INTRAVENOUS
  Filled 2019-08-14 (×3): qty 50

## 2019-08-14 MED ORDER — PHENYLEPHRINE HCL (PRESSORS) 10 MG/ML IV SOLN
INTRAVENOUS | Status: AC
  Filled 2019-08-14: qty 1

## 2019-08-14 MED ORDER — LIDOCAINE HCL (CARDIAC) PF 100 MG/5ML IV SOSY
PREFILLED_SYRINGE | INTRAVENOUS | Status: DC | PRN
  Administered 2019-08-14: 80 mg via INTRAVENOUS

## 2019-08-14 MED ORDER — SUGAMMADEX SODIUM 200 MG/2ML IV SOLN
INTRAVENOUS | Status: DC | PRN
  Administered 2019-08-14: 150 mg via INTRAVENOUS

## 2019-08-14 MED ORDER — MIDAZOLAM HCL 2 MG/2ML IJ SOLN
INTRAMUSCULAR | Status: AC
  Filled 2019-08-14: qty 2

## 2019-08-14 MED ORDER — SODIUM CHLORIDE 0.9 % IV SOLN: 500.0000 mL | Freq: Once | INTRAVENOUS | Status: DC | PRN

## 2019-08-14 MED ORDER — HEPARIN SODIUM (PORCINE) 1000 UNIT/ML IJ SOLN
INTRAMUSCULAR | Status: DC | PRN
  Administered 2019-08-14: 5000 [IU] via INTRAVENOUS

## 2019-08-14 MED ORDER — ROSUVASTATIN CALCIUM 20 MG PO TABS
20.0000 mg | ORAL_TABLET | Freq: Every day | ORAL | Status: DC
  Administered 2019-08-14: 10 mg via ORAL
  Filled 2019-08-14 (×3): qty 1
  Filled 2019-08-14: qty 2

## 2019-08-14 MED ORDER — CHLORHEXIDINE GLUCONATE CLOTH 2 % EX PADS
6.0000 | MEDICATED_PAD | Freq: Once | CUTANEOUS | Status: AC
  Administered 2019-08-14: 6 via TOPICAL

## 2019-08-14 MED ORDER — ONDANSETRON HCL 4 MG/2ML IJ SOLN: 4.0000 mg | Freq: Once | INTRAMUSCULAR | Status: DC | PRN

## 2019-08-14 MED ORDER — CEFAZOLIN SODIUM-DEXTROSE 2-4 GM/100ML-% IV SOLN
2.0000 g | Freq: Three times a day (TID) | INTRAVENOUS | Status: AC
  Administered 2019-08-14 (×2): 2 g via INTRAVENOUS
  Filled 2019-08-14 (×2): qty 100

## 2019-08-14 MED ORDER — CEFAZOLIN SODIUM-DEXTROSE 2-4 GM/100ML-% IV SOLN
2.0000 g | INTRAVENOUS | Status: AC
  Administered 2019-08-14: 2 g via INTRAVENOUS

## 2019-08-14 MED ORDER — PROPOFOL 10 MG/ML IV BOLUS
INTRAVENOUS | Status: DC | PRN
  Administered 2019-08-14: 80 mg via INTRAVENOUS

## 2019-08-14 MED ORDER — LABETALOL HCL 5 MG/ML IV SOLN: 10.0000 mg | INTRAVENOUS | Status: DC | PRN

## 2019-08-14 MED ORDER — GUAIFENESIN-DM 100-10 MG/5ML PO SYRP: 15.0000 mL | ORAL_SOLUTION | ORAL | Status: DC | PRN

## 2019-08-14 MED ORDER — DEXAMETHASONE SODIUM PHOSPHATE 10 MG/ML IJ SOLN
INTRAMUSCULAR | Status: AC
  Filled 2019-08-14: qty 1

## 2019-08-14 MED ORDER — LIDOCAINE HCL (PF) 2 % IJ SOLN
INTRAMUSCULAR | Status: AC
  Filled 2019-08-14: qty 5

## 2019-08-14 MED ORDER — FENTANYL CITRATE (PF) 100 MCG/2ML IJ SOLN
INTRAMUSCULAR | Status: DC | PRN
  Administered 2019-08-14 (×2): 50 ug via INTRAVENOUS

## 2019-08-14 MED ORDER — OXYCODONE-ACETAMINOPHEN 5-325 MG PO TABS: 1.0000 | ORAL_TABLET | ORAL | Status: DC | PRN

## 2019-08-14 MED ORDER — GLYCOPYRROLATE 0.2 MG/ML IJ SOLN
0.2000 mg | Freq: Once | INTRAMUSCULAR | Status: AC
  Filled 2019-08-14: qty 1

## 2019-08-14 MED ORDER — ALUM & MAG HYDROXIDE-SIMETH 200-200-20 MG/5ML PO SUSP: 15.0000 mL | ORAL | Status: DC | PRN

## 2019-08-14 MED ORDER — PHENYLEPHRINE HCL (PRESSORS) 10 MG/ML IV SOLN
INTRAVENOUS | Status: DC | PRN
  Administered 2019-08-14 (×2): 100 ug via INTRAVENOUS

## 2019-08-14 MED ORDER — HYDROMORPHONE HCL 1 MG/ML IJ SOLN: 1.0000 mg | Freq: Once | INTRAMUSCULAR | Status: DC | PRN

## 2019-08-14 MED ORDER — PHENOL 1.4 % MT LIQD
1.0000 | OROMUCOSAL | Status: DC | PRN
  Filled 2019-08-14: qty 177

## 2019-08-14 MED ORDER — ONDANSETRON HCL 4 MG/2ML IJ SOLN
4.0000 mg | Freq: Four times a day (QID) | INTRAMUSCULAR | Status: DC | PRN
  Administered 2019-08-15: 4 mg via INTRAVENOUS
  Filled 2019-08-14: qty 2

## 2019-08-14 MED ORDER — ACETAMINOPHEN 325 MG PO TABS: 325.0000 mg | ORAL_TABLET | ORAL | Status: DC | PRN

## 2019-08-14 MED ORDER — ACETAMINOPHEN 650 MG RE SUPP: 325.0000 mg | RECTAL | Status: DC | PRN

## 2019-08-14 MED ORDER — IODIXANOL 320 MG/ML IV SOLN
INTRAVENOUS | Status: DC | PRN
  Administered 2019-08-14: 150 mL

## 2019-08-14 MED ORDER — AMLODIPINE BESYLATE 5 MG PO TABS
5.0000 mg | ORAL_TABLET | Freq: Every day | ORAL | Status: DC
  Administered 2019-08-14 – 2019-08-15 (×2): 5 mg via ORAL
  Filled 2019-08-14 (×2): qty 1

## 2019-08-14 MED ORDER — EPHEDRINE 5 MG/ML INJ
INTRAVENOUS | Status: AC
  Filled 2019-08-14: qty 10

## 2019-08-14 MED ORDER — DOPAMINE-DEXTROSE 3.2-5 MG/ML-% IV SOLN: 3.0000 ug/kg/min | INTRAVENOUS | Status: DC

## 2019-08-14 MED ORDER — FENTANYL CITRATE (PF) 100 MCG/2ML IJ SOLN
25.0000 ug | INTRAMUSCULAR | Status: DC | PRN
  Administered 2019-08-14 (×2): 25 ug via INTRAVENOUS

## 2019-08-14 MED ORDER — MORPHINE SULFATE (PF) 2 MG/ML IV SOLN: 2.0000 mg | INTRAVENOUS | Status: DC | PRN

## 2019-08-14 MED ORDER — DEXAMETHASONE SODIUM PHOSPHATE 10 MG/ML IJ SOLN
INTRAMUSCULAR | Status: DC | PRN
  Administered 2019-08-14: 5 mg via INTRAVENOUS

## 2019-08-14 MED ORDER — ROCURONIUM BROMIDE 10 MG/ML (PF) SYRINGE
PREFILLED_SYRINGE | INTRAVENOUS | Status: AC
  Filled 2019-08-14: qty 10

## 2019-08-14 SURGICAL SUPPLY — 54 items
BLADE SURG 15 STRL LF DISP TIS (BLADE) IMPLANT
BLADE SURG 15 STRL SS (BLADE) ×2
BLADE SURG SZ11 CARB STEEL (BLADE) ×2 IMPLANT
BOOT SUTURE AID YELLOW STND (SUTURE) ×2 IMPLANT
CATH ACCU-VU SIZ PIG 5F 70CM (CATHETERS) ×2 IMPLANT
CATH BALLN CODA 9X100X32 (BALLOONS) ×2 IMPLANT
CATH BEACON 5 .035 65 KMP TIP (CATHETERS) ×2 IMPLANT
DERMABOND ADVANCED (GAUZE/BANDAGES/DRESSINGS) ×2
DERMABOND ADVANCED .7 DNX12 (GAUZE/BANDAGES/DRESSINGS) IMPLANT
DEVICE CLOSURE PERCLS PRGLD 6F (VASCULAR PRODUCTS) IMPLANT
DEVICE PRESTO INFLATION (MISCELLANEOUS) ×3 IMPLANT
DEVICE SAFEGUARD 24CM (GAUZE/BANDAGES/DRESSINGS) ×8 IMPLANT
DEVICE TORQUE .025-.038 (MISCELLANEOUS) ×2 IMPLANT
DRYSEAL FLEXSHEATH 12FR 33CM (SHEATH) ×2
DRYSEAL FLEXSHEATH 16FR 33CM (SHEATH) ×2
ELECT CAUTERY BLADE 6.4 (BLADE) ×2 IMPLANT
EXCLUDER TNK LEG 23MX12X16 (Endovascular Graft) IMPLANT
EXCLUDER TRUNK LEG 23MX12X16 (Endovascular Graft) ×3 IMPLANT
GLOVE BIO SURGEON STRL SZ7 (GLOVE) ×2 IMPLANT
GLOVE SURG SYN 8.0 (GLOVE) ×3 IMPLANT
GLOVE SURG SYN 8.0 PF PI (GLOVE) IMPLANT
GOWN STRL REUS W/ TWL LRG LVL3 (GOWN DISPOSABLE) IMPLANT
GOWN STRL REUS W/ TWL XL LVL3 (GOWN DISPOSABLE) IMPLANT
GOWN STRL REUS W/TWL LRG LVL3 (GOWN DISPOSABLE) ×2
GOWN STRL REUS W/TWL XL LVL3 (GOWN DISPOSABLE) ×4
GUIDEWIRE ANGLED .035 180CM (WIRE) ×2 IMPLANT
IV NS 500ML (IV SOLUTION) ×2
IV NS 500ML BAXH (IV SOLUTION) IMPLANT
LEG CONTRALATERAL 16X16X13.5 (Endovascular Graft) ×2 IMPLANT
LOOP RED MAXI  1X406MM (MISCELLANEOUS) ×2
LOOP VESSEL MAXI 1X406 RED (MISCELLANEOUS) IMPLANT
LOOP VESSEL MINI 0.8X406 BLUE (MISCELLANEOUS) IMPLANT
LOOPS BLUE MINI 0.8X406MM (MISCELLANEOUS) ×2
NDL ENTRY 21GA 7CM ECHOTIP (NEEDLE) IMPLANT
NEEDLE ENTRY 21GA 7CM ECHOTIP (NEEDLE) ×3 IMPLANT
PACK ANGIOGRAPHY (CUSTOM PROCEDURE TRAY) ×2 IMPLANT
PACK BASIN MAJOR ARMC (MISCELLANEOUS) ×2 IMPLANT
PERCLOSE PROGLIDE 6F (VASCULAR PRODUCTS) ×12
SET INTRO CAPELLA COAXIAL (SET/KITS/TRAYS/PACK) ×2 IMPLANT
SHEATH BRITE TIP 6FRX11 (SHEATH) ×4 IMPLANT
SHEATH BRITE TIP 8FRX11 (SHEATH) ×4 IMPLANT
SHEATH DRYSEAL FLEX 12FR 33CM (SHEATH) IMPLANT
SHEATH DRYSEAL FLEX 16FR 33CM (SHEATH) IMPLANT
STENT GRAFT CONTRALAT 16X13.5 (Endovascular Graft) IMPLANT
SUT MNCRL 4-0 (SUTURE) ×2
SUT MNCRL 4-0 27XMFL (SUTURE) ×1
SUT VIC AB 2-0 CT1 27 (SUTURE) ×2
SUT VIC AB 2-0 CT1 TAPERPNT 27 (SUTURE) IMPLANT
SUT VICRYL+ 3-0 36IN CT-1 (SUTURE) ×2 IMPLANT
SUTURE MNCRL 4-0 27XMF (SUTURE) IMPLANT
SYR MEDRAD MARK 7 150ML (SYRINGE) ×2 IMPLANT
TUBING CONTRAST HIGH PRESS 72 (TUBING) ×2 IMPLANT
WIRE AMPLATZ SSTIFF .035X260CM (WIRE) ×4 IMPLANT
WIRE J 3MM .035X145CM (WIRE) ×4 IMPLANT

## 2019-08-14 NOTE — Op Note (Signed)
OPERATIVE NOTE   PROCEDURE: 1. US guidance for vascular access, bilateral femoral arteries 2. Catheter placement into aorta from bilateral femoral approaches 3. Placement of a 23 mm proximal 12 mm distal, 16 cm length Gore Excluder Endoprosthesis main body left with a 16 mm diameter by 14 cm length right iliac contralateral limb 4. ProGlide closure devices bilateral femoral arteries  PRE-OPERATIVE DIAGNOSIS: AAA  POST-OPERATIVE DIAGNOSIS: same  SURGEON: Leotis Pain, MD and Hortencia Pilar, MD - Co-surgeons  ANESTHESIA: gen  ESTIMATED BLOOD LOSS: 10 cc  FINDING(S): 1.  AAA  SPECIMEN(S):  none  INDICATIONS:   Scott Oconnor is a 74 y.o. male who presents with greater than 6 cm abdominal aortic aneurysm. The anatomy was suitable for endovascular repair.  Risks and benefits of repair in an endovascular fashion were discussed and informed consent was obtained. Co-surgeons are used to expedite the procedure and reduce operative time as bilateral work needs to be done.  DESCRIPTION: After obtaining full informed written consent, the patient was brought back to the operating room and placed supine upon the operating table.  The patient received IV antibiotics prior to induction.  After obtaining adequate anesthesia, the patient was prepped and draped in the standard fashion for endovascular AAA repair.  We then began by gaining access to both femoral arteries with US guidance with me working on the right and Dr. Delana Meyer working on the left.  The femoral arteries were found to be patent and accessed without difficulty with a needle under ultrasound guidance without difficulty on each side and permanent images were recorded.  We then placed 2 proglide devices on each side in a pre-close fashion and placed 8 French sheaths. The patient was then given 5000 units of intravenous heparin. The Pigtail catheter was placed into the aorta from the left side. Using this image, we selected a 23 mm  diameter by 12 cm distal by 16 cm length Main body device.  Over a stiff wire, an 16 French sheath was placed up the left. The main body was then placed through the 16 French sheath. A Kumpe catheter was placed up the right side and a magnified image at the renal arteries was performed. The main body was then deployed just below the lowest renal artery. The Kumpe catheter was used to cannulate the contralateral gate without difficulty and successful cannulation was confirmed by twirling the pigtail catheter in the main body. We then placed a stiff wire and a retrograde arteriogram was performed through the right femoral sheath. We upsized to the 12 [place] sheath on the right for the contralateral limb and a 16 mm diameter by 14 cm length right iliac limb was selected and deployed. The main body deployment was then completed. Based off the angiographic findings, extension limbs were not necessary. All junction points and seals zones were treated with the compliant balloon. The pigtail catheter was then replaced and a completion angiogram was performed.  No endoleak was detected on completion angiography. The renal arteries were found to be widely patent.  Both hypogastric arteries were widely patent as well. At this point we elected to terminate the procedure. We secured the pro glide devices for hemostasis on the femoral arteries. The skin incision was closed with a 4-0 Monocryl. Dermabond and pressure dressing were placed. The patient was taken to the recovery room in stable condition having tolerated the procedure well.  COMPLICATIONS: none  CONDITION: stable  Leotis Pain  08/14/2019, 9:13 AM   This note was  created with Dragon Medical transcription system. Any errors in dictation are purely unintentional.

## 2019-08-14 NOTE — Anesthesia Preprocedure Evaluation (Signed)
Anesthesia Evaluation  Patient identified by MRN, date of birth, ID band Patient awake    Reviewed: Allergy & Precautions, H&P , NPO status , Patient's Chart, lab work & pertinent test results, reviewed documented beta blocker date and time   Airway Mallampati: II  TM Distance: >3 FB Neck ROM: full    Dental  (+) Teeth Intact   Pulmonary neg pulmonary ROS, Current Smoker and Patient abstained from smoking.,    Pulmonary exam normal        Cardiovascular Exercise Tolerance: Poor hypertension, On Medications + Peripheral Vascular Disease  Normal cardiovascular exam Rhythm:regular Rate:Normal     Neuro/Psych negative neurological ROS  negative psych ROS   GI/Hepatic negative GI ROS, Neg liver ROS,   Endo/Other  negative endocrine ROS  Renal/GU negative Renal ROS  negative genitourinary   Musculoskeletal   Abdominal   Peds  Hematology negative hematology ROS (+)   Anesthesia Other Findings Past Medical History: No date: Hyperlipidemia No date: Hypertension 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[facility]) Past Surgical History: No date: NO PAST SURGERIES   Reproductive/Obstetrics negative OB ROS                             Anesthesia Physical Anesthesia Plan  ASA: III  Anesthesia Plan: General ETT   Post-op Pain Management:    Induction:   PONV Risk Score and Plan: 2  Airway Management Planned:   Additional Equipment:   Intra-op Plan:   Post-operative Plan:   Informed Consent: I have reviewed the patients History and Physical, chart, labs and discussed the procedure including the risks, benefits and alternatives for the proposed anesthesia with the patient or authorized representative who has indicated his/her understanding and acceptance.     Dental Advisory Given  Plan Discussed with: CRNA  Anesthesia Plan Comments:         Anesthesia Quick Evaluation

## 2019-08-14 NOTE — Anesthesia Procedure Notes (Signed)
Procedure Name: Intubation Date/Time: 08/14/2019 7:56 AM Performed by: Chanetta Marshall, CRNA Pre-anesthesia Checklist: Patient identified, Emergency Drugs available, Suction available and Patient being monitored Patient Re-evaluated:Patient Re-evaluated prior to induction Oxygen Delivery Method: Circle system utilized Preoxygenation: Pre-oxygenation with 100% oxygen Induction Type: IV induction Ventilation: Mask ventilation without difficulty Laryngoscope Size: Mac and 3 Grade View: Grade I Tube type: Oral Tube size: 7.5 mm Number of attempts: 1 Airway Equipment and Method: Stylet and Oral airway Placement Confirmation: ETT inserted through vocal cords under direct vision,  positive ETCO2 and breath sounds checked- equal and bilateral Tube secured with: Tape Dental Injury: Teeth and Oropharynx as per pre-operative assessment

## 2019-08-14 NOTE — H&P (Signed)
[facility] History & Physical Update  The patient was interviewed and re-examined.  The patient's previous History and Physical has been reviewed and is unchanged.  There is no change in the plan of care. We plan to proceed with the scheduled procedure.  Hortencia Pilar, MD  08/14/2019, 7:30 AM

## 2019-08-14 NOTE — Transfer of Care (Signed)
Immediate Anesthesia Transfer of Care Note  Patient: Scott Oconnor  Procedure(s) Performed: ENDOVASCULAR REPAIR/STENT GRAFT (N/A )  Patient Location: PACU  Anesthesia Type:General  Level of Consciousness: awake, alert  and oriented  Airway & Oxygen Therapy: Patient Spontanous Breathing and Patient connected to face mask oxygen  Post-op Assessment: Report given to RN and Post -op Vital signs reviewed and stable  Post vital signs: Reviewed and stable  Last Vitals:  Vitals Value Taken Time  BP    Temp    Pulse    Resp    SpO2      Last Pain:  Vitals:   08/14/19 0722  TempSrc: Oral  PainSc: 0-No pain         Complications: No apparent anesthesia complications

## 2019-08-14 NOTE — Op Note (Signed)
OPERATIVE NOTE   PROCEDURE: 1. US guidance for vascular access, bilateral femoral arteries 2. Catheter placement into aorta from bilateral femoral approaches 3. Placement of a 23 x 12 x 16 C3 Gore Excluder Endoprosthesis main body with a 16 x 14 contralateral limb 4. ProGlide closure devices bilateral femoral arteries  PRE-OPERATIVE DIAGNOSIS: AAA  POST-OPERATIVE DIAGNOSIS: same  SURGEON: Hortencia Pilar, MD and Leotis Pain, MD - Co-surgeons  ANESTHESIA: general  ESTIMATED BLOOD LOSS: 50 cc  FINDING(S): 1.  AAA  SPECIMEN(S):  none  INDICATIONS:   Scott Oconnor is a 74 y.o. y.o. male who presents with a 5.7 cm abdominal aortic aneurysm.  He is sustained embolization of both great toes.  Risks and benefits for endovascular repair to prevent lethal rupture were reviewed all questions been answered patient agrees to proceed..  DESCRIPTION: After obtaining full informed written consent, the patient was brought back to the operating room and placed supine upon the operating table.  The patient received IV antibiotics prior to induction.  After obtaining adequate anesthesia, the patient was prepped and draped in the standard fashion for endovascular AAA repair.  Co-surgeons are required because this is a complex bilateral procedure with work being performed simultaneously from both the right femoral and left femoral approach.  This also expedites the procedure making a shorter operative time reducing complications and improving patient safety.  We then began by gaining access to both femoral arteries with US guidance with me working on the patient's left and Dr. Lucky Cowboy working on the patient's right.  The femoral arteries were found to be patent and accessed without difficulty with a needle under ultrasound guidance without difficulty on each side and permanent images were recorded.  We then placed 2 proglide devices on each side in a pre-close fashion and placed 8 French sheaths.  The  patient was then given 5000 units of intravenous heparin.   The Pigtail catheter was placed into the aorta from the left side. Using this image, we selected a 23 x 12 x 60 Main body device.  Over a stiff wire, an 16 French sheath was placed. The main body was then placed through the 18 French sheath. A Kumpe catheter was placed up the right side and a magnified image at the renal arteries was performed. The main body was then deployed just below the lowest renal artery. The Kumpe catheter was used to cannulate the contralateral gate without difficulty and successful cannulation was confirmed by twirling the pigtail catheter in the main body. We then placed a stiff wire and a retrograde arteriogram was performed through the right femoral sheath. We upsized to the 12 [place] sheath for the contralateral limb and a 16 x 14 limb was selected and deployed. The main body deployment was then completed. Based off the angiographic findings, extension limbs were not necessary.  All junction points and seals zones were treated with the compliant balloon.   The pigtail catheter was then replaced and a completion angiogram was performed.   No endoleak was detected on completion angiography. The renal arteries were found to be widely patent.    At this point we elected to terminate the procedure. We secured the pro glide devices for hemostasis on the femoral arteries. The skin incision was closed with a 4-0 Monocryl. Dermabond and pressure dressing were placed. The patient was taken to the recovery room in stable condition having tolerated the procedure well.  COMPLICATIONS: none  CONDITION: stable  Hortencia Pilar  08/14/2019, 9:20 AM

## 2019-08-15 ENCOUNTER — Encounter: Payer: Self-pay | Admitting: Cardiology

## 2019-08-15 DIAGNOSIS — I714 Abdominal aortic aneurysm, without rupture: Principal | ICD-10-CM

## 2019-08-15 LAB — BASIC METABOLIC PANEL
Anion gap: 9 (ref 5–15)
BUN: 17 mg/dL (ref 8–23)
CO2: 26 mmol/L (ref 22–32)
Calcium: 8.9 mg/dL (ref 8.9–10.3)
Chloride: 105 mmol/L (ref 98–111)
Creatinine, Ser: 1.4 mg/dL — ABNORMAL HIGH (ref 0.61–1.24)
GFR calc Af Amer: 58 mL/min — ABNORMAL LOW (ref >60)
GFR calc non Af Amer: 50 mL/min — ABNORMAL LOW (ref >60)
Glucose, Bld: 152 mg/dL — ABNORMAL HIGH (ref 70–99)
Potassium: 4.6 mmol/L (ref 3.5–5.1)
Sodium: 140 mmol/L (ref 135–145)

## 2019-08-15 LAB — CBC
HCT: 38.5 % — ABNORMAL LOW (ref 39.0–52.0)
Hemoglobin: 12.8 g/dL — ABNORMAL LOW (ref 13.0–17.0)
MCH: 30.9 pg (ref 26.0–34.0)
MCHC: 33.2 g/dL (ref 30.0–36.0)
MCV: 93 fL (ref 80.0–100.0)
Platelets: 148 10*3/uL — ABNORMAL LOW (ref 150–400)
RBC: 4.14 MIL/uL — ABNORMAL LOW (ref 4.22–5.81)
RDW: 14.6 % (ref 11.5–15.5)
WBC: 10.5 10*3/uL (ref 4.0–10.5)
nRBC: 0 % (ref 0.0–0.2)

## 2019-08-15 LAB — MAGNESIUM: Magnesium: 2.1 mg/dL (ref 1.7–2.4)

## 2019-08-15 MED ORDER — ENSURE ENLIVE PO LIQD: 237.0000 mL | Freq: Three times a day (TID) | ORAL | Status: DC

## 2019-08-15 MED ORDER — FAMOTIDINE 20 MG PO TABS: 20.0000 mg | ORAL_TABLET | Freq: Two times a day (BID) | ORAL | Status: DC

## 2019-08-15 NOTE — Anesthesia Postprocedure Evaluation (Signed)
Anesthesia Post Note  Patient: Careers adviser  Procedure(s) Performed: ENDOVASCULAR REPAIR/STENT GRAFT (N/A )  Patient location during evaluation: SICU Anesthesia Type: General Level of consciousness: awake and alert Pain management: pain level controlled Vital Signs Assessment: post-procedure vital signs reviewed and stable Respiratory status: spontaneous breathing, nonlabored ventilation and patient connected to nasal cannula oxygen Cardiovascular status: stable Postop Assessment: no apparent nausea or vomiting Anesthetic complications: no     Last Vitals:  Vitals:   08/15/19 0500 08/15/19 0600  BP: (!) 141/69 (!) 149/63  Pulse: 72 62  Resp: 14 15  Temp:    SpO2: 97% 96%    Last Pain:  Vitals:   08/15/19 0400  TempSrc: Oral  PainSc:                  Lia Foyer

## 2019-08-15 NOTE — Progress Notes (Signed)
PHARMACIST - PHYSICIAN COMMUNICATION  CONCERNING: IV to Oral Route Change Policy  RECOMMENDATION: This patient is receiving famotidine by the intravenous route.  Based on criteria approved by the Pharmacy and Therapeutics Committee, the intravenous medication(s) is/are being converted to the equivalent oral dose form(s).   DESCRIPTION: These criteria include:  The patient is eating (either orally or via tube) and/or has been taking other orally administered medications for a least 24 hours  The patient has no evidence of active gastrointestinal bleeding or impaired GI absorption (gastrectomy, short bowel, patient on TNA or NPO).  If you have questions about this conversion, please contact the Pharmacy Department [phone].   Fletcher Rathbun L, [facility] 08/15/2019 12:17 PM

## 2019-08-15 NOTE — Progress Notes (Signed)
Pt given discharge instructions with understanding. [name] present at bedside. Pt has no questions at this time. Monitor and IV's d/c.

## 2019-08-15 NOTE — Progress Notes (Signed)
Initial Nutrition Assessment  DOCUMENTATION CODES:   Severe malnutrition in context of social or environmental circumstances  INTERVENTION:   Ensure Enlive po BID, each supplement provides 350 kcal and 20 grams of protein  Magic cup TID with meals, each supplement provides 290 kcal and 9 grams of protein  Recommend daily MVI   NUTRITION DIAGNOSIS:   Severe Malnutrition related to social / environmental circumstances as evidenced by moderate fat depletion, moderate to severe muscle depletions.  GOAL:   Patient will meet greater than or equal to 90% of their needs  MONITOR:   PO intake, Supplement acceptance, Labs, Weight trends, Skin, I & O's  REASON FOR ASSESSMENT:   Other (Comment)(Low BMI)    ASSESSMENT:   74 y.o. y.o. male with h/o Raynauld's disease, HLD and PAD admitted with a 5.7 cm abdominal aortic aneurysm now s/p repair 3/24   Met with patient and pt's daughter in room today. Pt reports fair appetite and oral intake in hospital. Pt is eating food brought from home as he does not like the hospital food. Pt reports drinking chocolate Premier Protein 1-2 per day at home. Pt reports that as part of his culture and for his health, he tries to avoid sugar. Pt reports that a usual breakfast for him would include toast, butter and a banana. Pt also reports that he eats a lot of rice and vegetables. Pt does not eat "junk foods" such as cakes, cookies, candy, sugary drinks or soda. Pt reports eating some eggs and oatmeal for breakfast today. Per daughter, pt's UBW is ~130lbs. Per chart, pt has lost 7lbs(6%) at some point over the past year. Pt with severe muscle wasting on exam today. RD recommended that patient switch to a supplements with more calories. Spoke with patient and daughter about sugar, healthy sugars and the benefit of it when you need to gain wait. Pt would like to try chocolate Ensure Enlive while in hospital.   Medications reviewed and include: aspirin, colace,  MVI, pepcid, Mg sulfate  Labs reviewed: creat 1.40(H)  NUTRITION - FOCUSED PHYSICAL EXAM    Most Recent Value  Orbital Region  Moderate depletion  Upper Arm Region  Severe depletion  Thoracic and Lumbar Region  Severe depletion  Buccal Region  Moderate depletion  Temple Region  Moderate depletion  Clavicle Bone Region  Moderate depletion  Clavicle and Acromion Bone Region  Moderate depletion  Scapular Bone Region  Moderate depletion  Dorsal Hand  Severe depletion  Patellar Region  Severe depletion  Anterior Thigh Region  Severe depletion  Posterior Calf Region  Severe depletion  Edema (RD Assessment)  None  Hair  Reviewed  Eyes  Reviewed  Mouth  Reviewed  Skin  Reviewed  Nails  Reviewed     Diet Order:   Diet Order            Diet Heart Room service appropriate? Yes; Fluid consistency: Thin  Diet effective 0500             EDUCATION NEEDS:   Education needs have been addressed  Skin:  Skin Assessment: Reviewed RN Assessment  Last BM:  pta  Height:   Ht Readings from Last 1 Encounters:  08/14/19 '5\' 10"'  (1.778 m)    Weight:   Wt Readings from Last 1 Encounters:  08/14/19 54.4 kg    Ideal Body Weight:  75.4 kg  BMI:  Body mass index is 17.22 kg/m.  Estimated Nutritional Needs:   Kcal:  1700-2000kcal/day  Protein:  85-95g/day  Fluid:  >1.4L/day  Koleen Distance MS, RD, LDN Contact information available in Amion

## 2019-08-15 NOTE — Anesthesia Postprocedure Evaluation (Signed)
Anesthesia Post Note  Patient: Careers adviser  Procedure(s) Performed: ENDOVASCULAR REPAIR/STENT GRAFT (N/A )  Patient location during evaluation: PACU Anesthesia Type: General Level of consciousness: awake and alert Pain management: pain level controlled Vital Signs Assessment: post-procedure vital signs reviewed and stable Respiratory status: spontaneous breathing, nonlabored ventilation, respiratory function stable and patient connected to nasal cannula oxygen Cardiovascular status: blood pressure returned to baseline and stable Postop Assessment: no apparent nausea or vomiting Anesthetic complications: no     Last Vitals:  Vitals:   08/15/19 0500 08/15/19 0600  BP: (!) 141/69 (!) 149/63  Pulse: 72 62  Resp: 14 15  Temp:    SpO2: 97% 96%    Last Pain:  Vitals:   08/15/19 0400  TempSrc: Oral  PainSc:                  Molli Barrows

## 2019-08-15 NOTE — Progress Notes (Signed)
Pt ambulated on room air independently with no complaints. Pt tolerated well. Right and left groin sites remain level 0.

## 2019-08-16 NOTE — Discharge Summary (Signed)
[facility]    Discharge Summary  Patient ID:  Scott Oconnor MRN: [id number] DOB/AGE: 05-30-1945 74 y.o.  Admit date: 08/14/2019 Discharge date: 08/16/2019 Date of Surgery: 08/14/2019 Surgeon: Surgeon(s): Schnier, Dolores Lory, MD Algernon Huxley, MD  Admission Diagnosis: AAA (abdominal aortic aneurysm) [facility]) [I71.4]  Discharge Diagnoses:  AAA (abdominal aortic aneurysm) ([place]) [I71.4]  Secondary Diagnoses: Past Medical History:  Diagnosis Date  . Hyperlipidemia   . Hypertension   . Reynolds syndrome [facility])    Procedure(s): ENDOVASCULAR REPAIR/STENT GRAFT  Discharged Condition: Good  HPI / Hospital Course:  Scott Oconnor is a 74 y.o. y.o. male who presents with a 5.7 cm abdominal aortic aneurysm.  He is sustained embolization of both great toes. Scott Oconnor is a 74 y.o. y.o. male who presents with a 5.7 cm abdominal aortic aneurysm.  He is sustained embolization of both great toes. On 08/15/19, the patient underwent:  Procedure(s): 1. US guidance for vascular access, bilateral femoral arteries 2. Catheter placement into aorta from bilateral femoral approaches 3. Placement of a 23 mm proximal 12 mm distal, 16 cm length Gore Excluder Endoprosthesis main body left with a 16 mm diameter by 14 cm length right iliac contralateral limb 4. ProGlide closure devices bilateral femoral arteries  The patient tolerated the procedure well was transferred from the recovery room to the ICU for observation overnight.  The patient site of surgery was unremarkable.  During the patient's brief inpatient stay, his diet was advanced, his pain was controlled with the use of p.o. pain medications, his Foley was removed and he was ambulating at baseline.  Upon discharge, the patient was afebrile with stable vital signs and essentially unremarkable physical exam.  Physical exam:  Alert and oriented x3, No acute distress Cardiovascular: Regular rate and rhythm Pulmonary:  Clear to auscultation bilaterally Abdomen: Soft, nondistended, nontender, positive bowel sounds Right Groin:   PAD removed.  No swelling, drainage or ecchymosis noted. Left Groin:  PAD removed.  No swelling, drainage or ecchymosis noted. Vascular:   Bilateral lower extremities: Warm, nontender, minimal edema.  Labs: As below  Complications: None  Consults: None  Significant Diagnostic Studies: CBC Lab Results  Component Value Date   WBC 10.5 08/15/2019   HGB 12.8 (L) 08/15/2019   HCT 38.5 (L) 08/15/2019   MCV 93.0 08/15/2019   PLT 148 (L) 08/15/2019   BMET    Component Value Date/Time   NA 140 08/15/2019 0350   K 4.6 08/15/2019 0350   CL 105 08/15/2019 0350   CO2 26 08/15/2019 0350   GLUCOSE 152 (H) 08/15/2019 0350   BUN 17 08/15/2019 0350   CREATININE 1.40 (H) 08/15/2019 0350   CREATININE 1.30 (H) 06/19/2018 0941   CALCIUM 8.9 08/15/2019 0350   GFRNONAA 50 (L) 08/15/2019 0350   GFRNONAA 55 (L) 06/19/2018 0941   GFRAA 58 (L) 08/15/2019 0350   GFRAA 64 06/19/2018 0941   COAG Lab Results  Component Value Date   INR 1.0 08/14/2019   Disposition:  Discharge to :Home  Allergies as of 08/15/2019   No Known Allergies     Medication List    TAKE these medications   acetaminophen 500 MG tablet Commonly known as: TYLENOL Take 500 mg by mouth every 8 (eight) hours as needed for moderate pain.   amLODipine 5 MG tablet Commonly known as: NORVASC Take 1 tablet (5 mg total) by mouth daily.   aspirin EC 81 MG tablet Take 81 mg by mouth at bedtime.  multivitamin capsule Take 1 capsule by mouth daily.   rosuvastatin 20 MG tablet Commonly known as: CRESTOR Take 1 tablet (20 mg total) by mouth daily. What changed: how much to take   sildenafil 20 MG tablet Commonly known as: REVATIO Start with 1 tablet (20mg  dose) daily for Raynaud's problem with toes/fingers. If not improving symptoms can increase to 1 tab twice a day, only take while cold out       Verbal and written Discharge instructions given to the patient. Wound care per Discharge AVS Follow-up Information    Kris Hartmann, NP Follow up in 1 week(s).   Specialty: Vascular Surgery Why: First post-op incision check. No studies needed.  Contact information: [place] [phone]          Signed: Sela Hua, PA-C  08/16/2019, 11:59 AM

## 2019-08-22 ENCOUNTER — Ambulatory Visit (INDEPENDENT_AMBULATORY_CARE_PROVIDER_SITE_OTHER): Payer: Medicare Other | Admitting: Nurse Practitioner

## 2019-08-22 ENCOUNTER — Encounter (INDEPENDENT_AMBULATORY_CARE_PROVIDER_SITE_OTHER): Payer: Self-pay | Admitting: Nurse Practitioner

## 2019-08-22 ENCOUNTER — Other Ambulatory Visit: Payer: Self-pay

## 2019-08-22 VITALS — BP 142/73 | HR 76 | Ht 70.0 in | Wt 116.0 lb

## 2019-08-22 DIAGNOSIS — I714 Abdominal aortic aneurysm, without rupture, unspecified: Secondary | ICD-10-CM

## 2019-08-22 DIAGNOSIS — F17200 Nicotine dependence, unspecified, uncomplicated: Secondary | ICD-10-CM

## 2019-08-27 ENCOUNTER — Encounter (INDEPENDENT_AMBULATORY_CARE_PROVIDER_SITE_OTHER): Payer: Self-pay | Admitting: Nurse Practitioner

## 2019-08-27 NOTE — Progress Notes (Signed)
Subjective:    Patient ID: Scott Oconnor, male    DOB: 1945-12-07, 74 y.o.   MRN: [id number] Chief Complaint  Patient presents with  . Follow-up    1 Week [facility] post endo Vascular repair stent graft. incision check     Patient returns today 1 week after abdominal aortic aneurysm repair.  The patient reports that following the procedure he has been very tired and not been eating very much.  He attributes this partially due to a change in taste sensation.  He states that the changes in his taste has affected his eating.  He states that sometimes when he eats he gets nauseous.  The patient has a coating on his tongue however none in his throat, decreasing the likelihood of thrush.  Patient states that he has been drinking plenty of water but not much else.  He denies any fevers or loss of consciousness.  Denies any urinary symptoms.  Besides the lethargy the patient is doing well.   Review of Systems  Constitutional: Positive for activity change, appetite change and fatigue. Negative for fever.  Genitourinary: Negative for frequency and urgency.  Neurological: Positive for weakness.  All other systems reviewed and are negative.      Objective:   Physical Exam Vitals reviewed.  Skin:    General: Skin is warm and dry.     Comments: Bilateral wounds clean dry and intact     BP (!) 142/73   Pulse 76   Ht 5\' 10"  (1.778 m)   Wt 116 lb (52.6 kg)   BMI 16.64 kg/m   Past Medical History:  Diagnosis Date  . Hyperlipidemia   . Hypertension   . Reynolds syndrome [facility])     Social History   Socioeconomic History  . Marital status: Married    Spouse name: Not on file  . Number of children: Not on file  . Years of education: Not on file  . Highest education level: Not on file  Occupational History  . Not on file  Tobacco Use  . Smoking status: Current Every Day Smoker    Packs/day: 0.33    Types: Cigarettes  . Smokeless tobacco: Never Used  . Tobacco comment: 6 cigarettes  daily   Substance and Sexual Activity  . Alcohol use: No  . Drug use: No  . Sexual activity: Yes  Other Topics Concern  . Not on file  Social History Narrative  . Not on file   Social Determinants of Health   Financial Resource Strain:   . Difficulty of Paying Living Expenses:   Food Insecurity:   . Worried About Charity fundraiser in the Last Year:   . Arboriculturist in the Last Year:   Transportation Needs:   . Film/video editor (Medical):   Marland Kitchen Lack of Transportation (Non-Medical):   Physical Activity:   . Days of Exercise per Week:   . Minutes of Exercise per Session:   Stress:   . Feeling of Stress :   Social Connections:   . Frequency of Communication with Friends and Family:   . Frequency of Social Gatherings with Friends and Family:   . Attends Religious Services:   . Active Member of Clubs or Organizations:   . Attends Archivist Meetings:   Marland Kitchen Marital Status:   Intimate Partner Violence:   . Fear of Current or Ex-Partner:   . Emotionally Abused:   Marland Kitchen Physically Abused:   . Sexually Abused:  Past Surgical History:  Procedure Laterality Date  . ENDOVASCULAR REPAIR/STENT GRAFT N/A 08/14/2019   Procedure: ENDOVASCULAR REPAIR/STENT GRAFT;  Surgeon: Katha Cabal, MD;  Location: [facility];  Service: Cardiovascular;  Laterality: N/A;  . NO PAST SURGERIES      Family History  Problem Relation Age of Onset  . Heart disease Father   . Sudden Cardiac Death Father   . Heart disease Brother   . Hyperlipidemia Brother   . Heart disease Sister   . Heart disease Brother   . Hyperlipidemia Brother   . Hyperlipidemia Brother   . Hyperlipidemia Brother   . Hyperlipidemia Brother   . Hyperlipidemia Brother   . Heart disease Sister     No Known Allergies     Assessment & Plan:   1. AAA (abdominal aortic aneurysm) without rupture ([place]) Overall patient is doing well following procedure.  Both groin sites look well.  No evidence of  hematoma.  Patient does have some lethargy following the procedure however he has not been eating very much but has been drinking water.  Patient was advised to begin utilizing Gatorade or Powerade instead of just water as he may be dehydrated after undergoing recent intervention.  Patient's decreased taste sensation could possibly be from continued smoking or is as a consequence of the procedure.  Antiemetic was offered but patient declined.  The patient did not appear to have thrush.      2. Tobacco use disorder Smoking cessation is advised.  Continued smoking can worsen atherosclerotic disease.   Current Outpatient Medications on File Prior to Visit  Medication Sig Dispense Refill  . acetaminophen (TYLENOL) 500 MG tablet Take 500 mg by mouth every 8 (eight) hours as needed for moderate pain.     Marland Kitchen amLODipine (NORVASC) 5 MG tablet Take 1 tablet (5 mg total) by mouth daily. 30 tablet 11  . aspirin EC 81 MG tablet Take 81 mg by mouth at bedtime.     . Multiple Vitamin (MULTIVITAMIN) capsule Take 1 capsule by mouth daily.    . rosuvastatin (CRESTOR) 20 MG tablet Take 1 tablet (20 mg total) by mouth daily. (Patient taking differently: Take 10 mg by mouth daily. ) 90 tablet 3  . sildenafil (REVATIO) 20 MG tablet Start with 1 tablet (20mg  dose) daily for Raynaud's problem with toes/fingers. If not improving symptoms can increase to 1 tab twice a day, only take while cold out (Patient not taking: Reported on 08/05/2019) 60 tablet 3   No current facility-administered medications on file prior to visit.    There are no Patient Instructions on file for this visit. No follow-ups on file.   Kris Hartmann, NP

## 2019-08-28 ENCOUNTER — Emergency Department: Payer: Medicare Other

## 2019-08-28 ENCOUNTER — Emergency Department
Admission: EM | Admit: 2019-08-28 | Discharge: 2019-08-28 | Disposition: A | Discharge status: Home / Self Care | Payer: Medicare Other | Attending: Emergency Medicine | Admitting: Emergency Medicine

## 2019-08-28 ENCOUNTER — Other Ambulatory Visit: Payer: Self-pay

## 2019-08-28 ENCOUNTER — Encounter: Payer: Self-pay | Admitting: Emergency Medicine

## 2019-08-28 DIAGNOSIS — Z79899 Other long term (current) drug therapy: Secondary | ICD-10-CM | POA: Insufficient documentation

## 2019-08-28 DIAGNOSIS — R1010 Upper abdominal pain, unspecified: Secondary | ICD-10-CM | POA: Diagnosis not present

## 2019-08-28 DIAGNOSIS — Z7982 Long term (current) use of aspirin: Secondary | ICD-10-CM | POA: Diagnosis not present

## 2019-08-28 DIAGNOSIS — I10 Essential (primary) hypertension: Secondary | ICD-10-CM | POA: Diagnosis not present

## 2019-08-28 DIAGNOSIS — M545 Low back pain, unspecified: Secondary | ICD-10-CM

## 2019-08-28 DIAGNOSIS — F17210 Nicotine dependence, cigarettes, uncomplicated: Secondary | ICD-10-CM | POA: Diagnosis not present

## 2019-08-28 LAB — URINALYSIS, COMPLETE (UACMP) WITH MICROSCOPIC
Bacteria, UA: NONE SEEN
Bilirubin Urine: NEGATIVE
Glucose, UA: NEGATIVE mg/dL
Hgb urine dipstick: NEGATIVE
Ketones, ur: NEGATIVE mg/dL
Leukocytes,Ua: NEGATIVE
Nitrite: NEGATIVE
Protein, ur: NEGATIVE mg/dL
Specific Gravity, Urine: 1.021 (ref 1.005–1.030)
Squamous Epithelial / HPF: NONE SEEN (ref 0–5)
pH: 7 (ref 5.0–8.0)

## 2019-08-28 LAB — COMPREHENSIVE METABOLIC PANEL
ALT: 32 U/L (ref 0–44)
AST: 26 U/L (ref 15–41)
Albumin: 3.6 g/dL (ref 3.5–5.0)
Alkaline Phosphatase: 81 U/L (ref 38–126)
Anion gap: 9 (ref 5–15)
BUN: 24 mg/dL — ABNORMAL HIGH (ref 8–23)
CO2: 29 mmol/L (ref 22–32)
Calcium: 8.6 mg/dL — ABNORMAL LOW (ref 8.9–10.3)
Chloride: 99 mmol/L (ref 98–111)
Creatinine, Ser: 1.66 mg/dL — ABNORMAL HIGH (ref 0.61–1.24)
GFR calc Af Amer: 47 mL/min — ABNORMAL LOW (ref >60)
GFR calc non Af Amer: 41 mL/min — ABNORMAL LOW (ref >60)
Glucose, Bld: 124 mg/dL — ABNORMAL HIGH (ref 70–99)
Potassium: 3.2 mmol/L — ABNORMAL LOW (ref 3.5–5.1)
Sodium: 137 mmol/L (ref 135–145)
Total Bilirubin: 0.7 mg/dL (ref 0.3–1.2)
Total Protein: 7.3 g/dL (ref 6.5–8.1)

## 2019-08-28 LAB — CBC WITH DIFFERENTIAL/PLATELET
Abs Immature Granulocytes: 0.03 10*3/uL (ref 0.00–0.07)
Basophils Absolute: 0.1 10*3/uL (ref 0.0–0.1)
Basophils Relative: 1 %
Eosinophils Absolute: 0.5 10*3/uL (ref 0.0–0.5)
Eosinophils Relative: 6 %
HCT: 37.4 % — ABNORMAL LOW (ref 39.0–52.0)
Hemoglobin: 12.5 g/dL — ABNORMAL LOW (ref 13.0–17.0)
Immature Granulocytes: 0 %
Lymphocytes Relative: 13 %
Lymphs Abs: 1.2 10*3/uL (ref 0.7–4.0)
MCH: 30.5 pg (ref 26.0–34.0)
MCHC: 33.4 g/dL (ref 30.0–36.0)
MCV: 91.2 fL (ref 80.0–100.0)
Monocytes Absolute: 0.6 10*3/uL (ref 0.1–1.0)
Monocytes Relative: 7 %
Neutro Abs: 7 10*3/uL (ref 1.7–7.7)
Neutrophils Relative %: 73 %
Platelets: 229 10*3/uL (ref 150–400)
RBC: 4.1 MIL/uL — ABNORMAL LOW (ref 4.22–5.81)
RDW: 14.3 % (ref 11.5–15.5)
WBC: 9.5 10*3/uL (ref 4.0–10.5)
nRBC: 0 % (ref 0.0–0.2)

## 2019-08-28 LAB — PROTIME-INR
INR: 1.1 (ref 0.8–1.2)
Prothrombin Time: 14.4 seconds (ref 11.4–15.2)

## 2019-08-28 LAB — LACTIC ACID, PLASMA: Lactic Acid, Venous: 0.7 mmol/L (ref 0.5–1.9)

## 2019-08-28 LAB — TROPONIN I (HIGH SENSITIVITY): Troponin I (High Sensitivity): 9 ng/L (ref <18)

## 2019-08-28 LAB — LIPASE, BLOOD: Lipase: 55 U/L — ABNORMAL HIGH (ref 11–51)

## 2019-08-28 MED ORDER — MORPHINE SULFATE (PF) 4 MG/ML IV SOLN
4.0000 mg | Freq: Once | INTRAVENOUS | Status: DC
  Filled 2019-08-28: qty 1

## 2019-08-28 MED ORDER — IOHEXOL 350 MG/ML SOLN
75.0000 mL | Freq: Once | INTRAVENOUS | Status: AC | PRN
  Administered 2019-08-28: 75 mL via INTRAVENOUS

## 2019-08-28 MED ORDER — ACETAMINOPHEN-CODEINE 300-30 MG PO TABS: 1.0000 | ORAL_TABLET | ORAL | 0 refills | Status: AC | PRN

## 2019-08-28 MED ORDER — ACETAMINOPHEN 325 MG PO TABS
650.0000 mg | ORAL_TABLET | Freq: Once | ORAL | Status: DC
  Filled 2019-08-28: qty 2

## 2019-08-28 NOTE — ED Notes (Signed)
Daughter at bedside.

## 2019-08-28 NOTE — Discharge Instructions (Addendum)
Take the Tylenol 3 prescribed today, or regular Tylenol as needed for the back pain.  Follow-up with Dr. Delana Meyer as scheduled.  Return to the ER for new, worsening, or persistent severe back pain, weakness or numbness, fever, vomiting, urinary symptoms, or any other new or worsening symptoms that concern you.

## 2019-08-28 NOTE — ED Notes (Signed)
Pt would like some food before PO medication.

## 2019-08-28 NOTE — ED Provider Notes (Signed)
[facility] Emergency Department Provider Note ____________________________________________   First MD Initiated Contact with Patient 08/28/19 [phone]     (approximate)  I have reviewed the triage vital signs and the nursing notes.   HISTORY  Chief Complaint Back Pain and Abdominal Pain    HPI Scott Oconnor is a 74 y.o. male with PMH as noted below and status post endovascular aortofemoral stent placement on 3/24 who presents with bilateral low back pain over the last few weeks since the surgery, persistent course, nonradiating, and not associated with weakness or numbness.  The patient has been taking Tylenol with some relief, but states his symptoms have persisted.  He spoke to his niece who is a physician and who told him to come be evaluated.  The patient also reports some upper abdominal pain intermittently, usually after eating, and resolved after he drinks some coffee.  He denies any vomiting or diarrhea, urinary symptoms, fever or chills, or other acute symptoms.  Past Medical History:  Diagnosis Date  . Hyperlipidemia   . Hypertension   . Reynolds syndrome [facility])     Patient Active Problem List   Diagnosis Date Noted  . AAA (abdominal aortic aneurysm) ([place]) 08/14/2019  . AAA (abdominal aortic aneurysm) without rupture ([place]) 08/01/2019  . PAD (peripheral artery disease) ([place]) 07/24/2019  . Tobacco use disorder 07/24/2019  . Raynaud's disease without gangrene 06/21/2019  . Mixed dyslipidemia 06/15/2018  . Family history of heart attack 06/15/2018  . Smokers' cough ([place]) 06/16/2017    Past Surgical History:  Procedure Laterality Date  . ENDOVASCULAR REPAIR/STENT GRAFT N/A 08/14/2019   Procedure: ENDOVASCULAR REPAIR/STENT GRAFT;  Surgeon: Katha Cabal, MD;  Location: [place] CV LAB;  Service: Cardiovascular;  Laterality: N/A;  . NO PAST SURGERIES      Prior to Admission medications   Medication Sig Start Date End Date Taking?  Authorizing Provider  acetaminophen (TYLENOL) 500 MG tablet Take 500 mg by mouth every 8 (eight) hours as needed for moderate pain.     [provider]  Acetaminophen-Codeine (TYLENOL/CODEINE #3) 300-30 MG tablet Take 1 tablet by mouth every 4 (four) hours as needed for up to 5 days for pain. 08/28/19 09/02/19  Arta Silence, MD  amLODipine (NORVASC) 5 MG tablet Take 1 tablet (5 mg total) by mouth daily. 07/22/19   Schnier, Dolores Lory, MD  aspirin EC 81 MG tablet Take 81 mg by mouth at bedtime.     [provider]  Multiple Vitamin (MULTIVITAMIN) capsule Take 1 capsule by mouth daily.    [provider]  rosuvastatin (CRESTOR) 20 MG tablet Take 1 tablet (20 mg total) by mouth daily. Patient taking differently: Take 10 mg by mouth daily.  08/02/19 10/31/19  [name], MD  sildenafil (REVATIO) 20 MG tablet Start with 1 tablet (20mg  dose) daily for Raynaud's problem with toes/fingers. If not improving symptoms can increase to 1 tab twice a day, only take while cold out Patient not taking: Reported on 08/05/2019 06/21/19   Olin Hauser, DO    Allergies Patient has no known allergies.  Family History  Problem Relation Age of Onset  . Heart disease Father   . Sudden Cardiac Death Father   . Heart disease Brother   . Hyperlipidemia Brother   . Heart disease Sister   . Heart disease Brother   . Hyperlipidemia Brother   . Hyperlipidemia Brother   . Hyperlipidemia Brother   . Hyperlipidemia Brother   . Hyperlipidemia  Brother   . Heart disease Sister     Social History Social History   Tobacco Use  . Smoking status: Current Every Day Smoker    Packs/day: 0.33    Types: Cigarettes  . Smokeless tobacco: Never Used  . Tobacco comment: 6 cigarettes daily   Substance Use Topics  . Alcohol use: No  . Drug use: No    Review of Systems  Constitutional: No fever/chills. Eyes: No visual changes. ENT: No sore throat. Cardiovascular: Denies chest  pain. Respiratory: Denies shortness of breath. Gastrointestinal: No nausea, no vomiting.  No diarrhea.  Genitourinary: Negative for dysuria.  Musculoskeletal: Positive for back pain. Skin: Negative for rash. Neurological: Negative for headaches, focal weakness or numbness.   ____________________________________________   PHYSICAL EXAM:  VITAL SIGNS: ED Triage Vitals  Enc Vitals Group     BP 08/28/19 0851 (!) 142/82     Pulse Rate 08/28/19 0851 81     Resp 08/28/19 0851 16     Temp 08/28/19 0851 (!) 97.4 F (36.3 C)     Temp Source 08/28/19 0851 Oral     SpO2 08/28/19 0851 99 %     Weight 08/28/19 0852 119 lb (54 kg)     Height 08/28/19 0852 5\' 10"  (1.778 m)     Head Circumference --      Peak Flow --      Pain Score 08/28/19 0852 3     Pain Loc --      Pain Edu? --      Excl. in [place]? --     Constitutional: Alert and oriented. Well appearing and in no acute distress. Eyes: Conjunctivae are normal.  Head: Atraumatic. Nose: No congestion/rhinnorhea. Mouth/Throat: Mucous membranes are moist.   Neck: Normal range of motion.  Cardiovascular: Normal rate, regular rhythm.  Good peripheral circulation. Respiratory: Normal respiratory effort.  No retractions.  Gastrointestinal: Soft and nontender. No distention.  Genitourinary: No CVA tenderness. Musculoskeletal: No lower extremity edema.  Extremities warm and well perfused.  Intact distal pulses bilaterally.  No midline spinal tenderness.  Bilateral lumbar mild paraspinal tenderness. Neurologic:  Normal speech and language. No gross focal neurologic deficits are appreciated.  Skin:  Skin is warm and dry. No rash noted. Psychiatric: Mood and affect are normal. Speech and behavior are normal.  ____________________________________________   LABS (all labs ordered are listed, but only abnormal results are displayed)  Labs Reviewed  COMPREHENSIVE METABOLIC PANEL - Abnormal; Notable for the following components:      Result  Value   Potassium 3.2 (*)    Glucose, Bld 124 (*)    BUN 24 (*)    Creatinine, Ser 1.66 (*)    Calcium 8.6 (*)    GFR calc non Af Amer 41 (*)    GFR calc Af Amer 47 (*)    All other components within normal limits  CBC WITH DIFFERENTIAL/PLATELET - Abnormal; Notable for the following components:   RBC 4.10 (*)    Hemoglobin 12.5 (*)    HCT 37.4 (*)    All other components within normal limits  LIPASE, BLOOD - Abnormal; Notable for the following components:   Lipase 55 (*)    All other components within normal limits  URINALYSIS, COMPLETE (UACMP) WITH MICROSCOPIC - Abnormal; Notable for the following components:   Color, Urine YELLOW (*)    APPearance CLEAR (*)    All other components within normal limits  LACTIC ACID, PLASMA  PROTIME-INR  TROPONIN I (HIGH SENSITIVITY)  ____________________________________________  EKG   ____________________________________________  RADIOLOGY  CT angio abdomen/pelvis: Stent in place with no endoleak.  Left renal artery ostial stenosis.  ____________________________________________   PROCEDURES  Procedure(s) performed: No  Procedures  Critical Care performed: No ____________________________________________   INITIAL IMPRESSION / ASSESSMENT AND PLAN / ED COURSE  Pertinent labs & imaging results that were available during my care of the patient were reviewed by me and considered in my medical decision making (see chart for details).  74 year old male with PMH as noted above and status post aortofemoral bypass on 3/24 presents with persistent bilateral lower back pain since the surgery which is minimally relieved by Tylenol.  He also has some intermittent upper abdominal pain after eating.  I reviewed the past medical records in epic and confirmed the history of aortic aneurysm status post endovascular aortofemoral stent placement on 3/24.  The patient had 1 follow-up visit since then was doing well.  On exam, the patient is  overall well-appearing.  His vital signs are normal.  The abdomen is soft and nontender.  He has mild bilateral lumbar paraspinal tenderness, but no midline tenderness.  He has no significant pain to the lower extremities and intact distal pulses.  Overall I suspect most likely routine postprocedural pain, muscle strain/spasm, or other relatively benign cause given the patient's well appearance.  However, differential also includes postsurgical complication such as a leak into the aneurysm.  We will obtain a lab work-up, CT angio, and discuss with vascular surgery.  ----------------------------------------- 2:32 PM on 08/28/2019 -----------------------------------------  CT shows no evidence of endoleak or other complication related to the patient's stent.  There is an ostial stenosis to the left renal artery, but this does not correspond to the location of the patient's pain.  I discussed the patient's case and imaging findings with Dr. Lucky Cowboy from vascular surgery.  He does not recommend any intervention at this time, and states the patient may follow-up routinely as scheduled with Dr. Delana Meyer.  He advises that the pain the patient is experiencing is not unusual after the procedure he had.  On reassessment, the patient was comfortable appearing.  I counseled him on the results of the work-up and on the vascular surgery recommendations.  He and his daughter agreed with the plan.  Return precautions given, and they expressed understanding.  ____________________________________________   FINAL CLINICAL IMPRESSION(S) / ED DIAGNOSES  Final diagnoses:  Bilateral low back pain without sciatica, unspecified chronicity      NEW MEDICATIONS STARTED DURING THIS VISIT:  Discharge Medication List as of 08/28/2019  1:25 PM    START taking these medications   Details  Acetaminophen-Codeine (TYLENOL/CODEINE #3) 300-30 MG tablet Take 1 tablet by mouth every 4 (four) hours as needed for up to 5 days for  pain., Starting Wed 08/28/2019, Until Mon 09/02/2019, Normal         Note:  This document was prepared using Dragon voice recognition software and may include unintentional dictation errors.    Arta Silence, MD 08/28/19 1433

## 2019-08-28 NOTE — ED Notes (Signed)
This RN called CT.

## 2019-08-28 NOTE — ED Notes (Signed)
Pt aware of UA sample.

## 2019-08-28 NOTE — ED Triage Notes (Signed)
Patient had abdominal aorta surgery two weeks ago, and had stents placed.  Patient has been having pain x 2 weeks, to back and abdomen.  Patient also c/o weakness.

## 2019-08-28 NOTE — ED Notes (Signed)
St feeling weak for 2 weeks.  Denies CP/SHOB.

## 2019-08-28 NOTE — ED Notes (Signed)
This RN called lab to check on result status. Per lab, specimens are been processed at this time. EDP Siadecki made aware.

## 2019-09-12 ENCOUNTER — Other Ambulatory Visit (INDEPENDENT_AMBULATORY_CARE_PROVIDER_SITE_OTHER): Payer: Self-pay | Admitting: Nurse Practitioner

## 2019-09-12 ENCOUNTER — Telehealth (INDEPENDENT_AMBULATORY_CARE_PROVIDER_SITE_OTHER): Payer: Self-pay | Admitting: Vascular Surgery

## 2019-09-12 DIAGNOSIS — R5383 Other fatigue: Secondary | ICD-10-CM

## 2019-09-12 NOTE — Telephone Encounter (Signed)
I spoke with patient daughter and she informed that her father doesn't have the energy or strength to go to [facility]  for lab work but she will speak with him to see if he will try to go. Patient daughter informed that from his last experience from getting labs drown he arm was throbbing for few hours.

## 2019-09-12 NOTE — Telephone Encounter (Signed)
I'm not sure why he doesn't have a follow up but have him come in with an EVAR to see Dr. Delana Meyer

## 2019-09-12 NOTE — Telephone Encounter (Signed)
Also, can we let the patient know that we would like him to go get some bloodwork done in the outpatient lab at [facility], so that way we can look at a few things before his office visit.

## 2019-09-12 NOTE — Telephone Encounter (Signed)
Schnier doesn't have any early morning appts for fasting [place] until 10/07/2019, Arna Medici first openings is on 09/18/2019, does it have to be with [employer]. Thanks

## 2019-09-12 NOTE — Telephone Encounter (Signed)
If he can go to get the labwork, that would be the most helpful in trying to allow [place] to find a diagnosis or at least rule out some things.  It's very unusual to continue to have these symptoms nearly two months after the procedure.  In the absence of this labwork we don't have much information to utilize.  Unfortunately we are not able to draw blood at our office.

## 2019-09-12 NOTE — Telephone Encounter (Signed)
Patient scheduled to see Schnier on Monday 09/16/19

## 2019-09-16 ENCOUNTER — Ambulatory Visit (INDEPENDENT_AMBULATORY_CARE_PROVIDER_SITE_OTHER): Payer: Medicare Other | Admitting: Vascular Surgery

## 2019-09-16 ENCOUNTER — Other Ambulatory Visit: Payer: Self-pay

## 2019-09-16 ENCOUNTER — Encounter (INDEPENDENT_AMBULATORY_CARE_PROVIDER_SITE_OTHER): Payer: Self-pay | Admitting: Vascular Surgery

## 2019-09-16 VITALS — BP 196/84 | HR 78 | Ht 70.0 in | Wt 117.0 lb

## 2019-09-16 DIAGNOSIS — I714 Abdominal aortic aneurysm, without rupture, unspecified: Secondary | ICD-10-CM

## 2019-09-16 NOTE — Progress Notes (Signed)
    Patient ID: Scott Oconnor, male   DOB: [birth year], 74 y.o.   MRN: [id number]  Chief Complaint  Patient presents with  . Follow-up    Concerns since procedure    HPI Scott Oconnor is a 74 y.o. male.    Patient is status post endovascular repair of abdominal aortic aneurysm on arch 24th 2021.  His postoperative course has been complicated by lethargy and anorexia.  He was seen in the emergency room on April 7 CT scan of the abdomen and pelvis was performed that time which demonstrated successful aneurysm repair no evidence of endoleak and a slight decrease in the overall sac dimension.  Today he is still c/o weakness and that the sight of food nauseates him.   Past Medical History:  Diagnosis Date  . Hyperlipidemia   . Hypertension   . Reynolds syndrome [facility])     Past Surgical History:  Procedure Laterality Date  . ENDOVASCULAR REPAIR/STENT GRAFT N/A 08/14/2019   Procedure: ENDOVASCULAR REPAIR/STENT GRAFT;  Surgeon: Katha Cabal, MD;  Location: [place] CV LAB;  Service: Cardiovascular;  Laterality: N/A;  . NO PAST SURGERIES        No Known Allergies  Current Outpatient Medications  Medication Sig Dispense Refill  . acetaminophen (TYLENOL) 500 MG tablet Take 500 mg by mouth every 8 (eight) hours as needed for moderate pain.     Marland Kitchen amLODipine (NORVASC) 5 MG tablet Take 1 tablet (5 mg total) by mouth daily. 30 tablet 11  . aspirin EC 81 MG tablet Take 81 mg by mouth at bedtime.     . Multiple Vitamin (MULTIVITAMIN) capsule Take 1 capsule by mouth daily.    . rosuvastatin (CRESTOR) 20 MG tablet Take 1 tablet (20 mg total) by mouth daily. (Patient taking differently: Take 10 mg by mouth daily. ) 90 tablet 3  . sildenafil (REVATIO) 20 MG tablet Start with 1 tablet (20mg  dose) daily for Raynaud's problem with toes/fingers. If not improving symptoms can increase to 1 tab twice a day, only take while cold out (Patient not taking: Reported on 08/05/2019) 60 tablet  3   No current facility-administered medications for this visit.        Physical Exam BP (!) 196/84   Pulse 78   Ht 5\' 10"  (1.778 m)   Wt 117 lb (53.1 kg)   BMI 16.79 kg/m  Gen:  WD/WN, NAD Skin: incision C/D/I     Assessment/Plan: 1. AAA (abdominal aortic aneurysm) without rupture ([place]) I have encouraged him to get the labs that are ordered  He will work to prevent constipation  Follow up duplex is ordered  I have reviewed the post procedure CT and the final images from the EVAR and I  believe the left renal artery stenosis is artifact from the stent struts  - VAS US AORTA/IVC/ILIACS; Future      Hortencia Pilar 09/16/2019, 2:59 PM   This note was created with Dragon medical transcription system.  Any errors from dictation are unintentional.

## 2019-09-18 ENCOUNTER — Telehealth: Payer: Self-pay | Admitting: Cardiovascular Disease

## 2019-09-18 NOTE — Telephone Encounter (Signed)
Patients daughter is called stated patients vascular doctor is wanting to make changes in medication. Dr. Delana Meyer  recommending stoping the cholesterol medication (crestor 20 mg ) for one month to see if the weakness and fatigue go away. Patient is having lab work done through PCP tomorrow and will then be available  Patient had an endovascular triple A graph repair done March 24 which started all this.

## 2019-09-18 NOTE — Telephone Encounter (Signed)
No answer/Voicemail box is full.  

## 2019-09-18 NOTE — Telephone Encounter (Signed)
Spoke with patients daughter with patient beside her. She states that patient has had increased fatigue and body aches since he started the new rosuvastatin. He was previously on simvastatin and did not have these symptoms. At follow up with vascular surgeon they told him to hold that medication for month to see if his symptoms improve. Reviewed with her that this is a common intolerance and to keep [place] updated if his symptoms improve so we know. Let her know that I would update provider on this and if he should have any recommendations then we would call back. Reviewed that we may wait until she calls back before making changes. She verbalized understanding of our conversation, agreement with plan, and had no further questions.

## 2019-09-19 ENCOUNTER — Telehealth: Payer: Self-pay | Admitting: Family Medicine

## 2019-09-19 ENCOUNTER — Encounter: Payer: Self-pay | Admitting: Family Medicine

## 2019-09-19 ENCOUNTER — Other Ambulatory Visit: Payer: Self-pay

## 2019-09-19 ENCOUNTER — Ambulatory Visit (INDEPENDENT_AMBULATORY_CARE_PROVIDER_SITE_OTHER): Payer: Medicare Other | Admitting: Family Medicine

## 2019-09-19 VITALS — BP 144/92 | HR 74 | Temp 97.7°F | Resp 16 | Ht 70.0 in | Wt 117.0 lb

## 2019-09-19 DIAGNOSIS — R8290 Unspecified abnormal findings in urine: Secondary | ICD-10-CM

## 2019-09-19 DIAGNOSIS — Z79899 Other long term (current) drug therapy: Secondary | ICD-10-CM

## 2019-09-19 DIAGNOSIS — R5383 Other fatigue: Secondary | ICD-10-CM | POA: Diagnosis not present

## 2019-09-19 DIAGNOSIS — I10 Essential (primary) hypertension: Secondary | ICD-10-CM | POA: Diagnosis not present

## 2019-09-19 DIAGNOSIS — R110 Nausea: Secondary | ICD-10-CM

## 2019-09-19 DIAGNOSIS — R7309 Other abnormal glucose: Secondary | ICD-10-CM | POA: Diagnosis not present

## 2019-09-19 LAB — POCT URINALYSIS DIPSTICK
Bilirubin, UA: NEGATIVE
Glucose, UA: NEGATIVE
Ketones, UA: NEGATIVE
Leukocytes, UA: NEGATIVE
Nitrite, UA: NEGATIVE
Protein, UA: POSITIVE — AB
Spec Grav, UA: 1.01 (ref 1.010–1.025)
Urobilinogen, UA: 0.2 E.U./dL
pH, UA: 5 (ref 5.0–8.0)

## 2019-09-19 LAB — POCT GLYCOSYLATED HEMOGLOBIN (HGB A1C): Hemoglobin A1C: 6.2 % — AB (ref 4.0–5.6)

## 2019-09-19 MED ORDER — PROMETHAZINE HCL 12.5 MG PO TABS: 6.2500 mg | ORAL_TABLET | Freq: Three times a day (TID) | ORAL | 0 refills | Status: DC | PRN

## 2019-09-19 NOTE — Progress Notes (Signed)
A1C 6.2

## 2019-09-19 NOTE — Telephone Encounter (Signed)
Patient's daughter Kenney Houseman is calling regarding the paitent's elevated Blood UA level. Please advise CB- [phone]

## 2019-09-19 NOTE — Assessment & Plan Note (Addendum)
Likely fatigue related to nausea and decreased calorie consumption.  Discussed with patient and daughter will send in Rx for phenergan 12.5mg  to take 1/2 tablet (6.25mg ) every 8 hours as needed for nausea.  Once an hour has passed from taking medications to begin trying to increase calorie consumption and should see an decrease in fatigue and increase in energy.    Bilateral lower extremity edema is slightly less than 1+ but is wearing tight fitting socks that can give the impression that swelling is greater than it is.  EKG was completed in clinic and similar to EKG completed in patient on 08/14/2019 and reviewed with Dr. Parks Ranger in clinic.  Urine showing proteinuria, will send to the lab for further testing and if confirmed at lab, will discuss referral to nephrology  A1C in clinic showing patient as prediabetic at 6.2%.  Will re-evaluate in 3 months with additional A1C.    Rash appears as dermatitis.  Patient and daughter unsure if pt's wife has switched the laundry detergent.  Plan: 1. Labs drawn today and will contact with results 2. To contact cardiology and vascular to provide update and schedule follow up 3. To discuss with cardiology if they would like him to switch from amlodipine to another antihypertensive due to concern of ankle swelling 4. Will follow up once labs are resulted

## 2019-09-19 NOTE — Telephone Encounter (Signed)
The pt daughter called and ask if we can add on Uric Acid, ESR, and CRP to the patient lab order to compare with his recent labs.

## 2019-09-19 NOTE — Progress Notes (Signed)
Subjective:    Patient ID: Scott Oconnor, male    DOB: January 05, 1946, 74 y.o.   MRN: [id number]  Scott Oconnor is a 74 y.o. male presenting on 09/19/2019 for Nausea (lack of appetite, swelling in feet and fatigue. The pt recently was put on Crestor by his cardiologist who discontinued the medication because they thought was contributing to the fatigue and bodyaches. The pt recently had AAA surgery x 1 month ago. ) and Urticaria (bilateral hives on both arms with swelling in elbows.)   HPI  [name] presents to clinic with his daughter today.  Reports that he has been having nausea, feels hungry but once food is in front of him he does not want to eat and has been having some swelling in his feet and fatigue.  Reports these symptoms have started since his surgery x 1 month ago.  Recently put on Crestor and then it was discontinued thinking that was contributing to the fatigue and body aches.  Reports body aches have resolved since stopping the Crestor.  Denies shortness of breath, DOE, cough, orthopnea, headaches, visual changes, chest pain, abdominal pain, vomiting or diarrhea.  Has concerns of rash on bilateral elbow.  Daughter states is unsure how long they have been present as she just noticed them today.  Denies itching, fevers, drainage, pain with ROM or injury.    Depression screen [facility] 2/9 06/21/2019 06/27/2017 06/16/2017  Decreased Interest 0 0 0  Down, Depressed, Hopeless 0 0 0  PHQ - 2 Score 0 0 0    Social History   Tobacco Use  . Smoking status: Current Every Day Smoker    Packs/day: 0.33    Types: Cigarettes  . Smokeless tobacco: Never Used  . Tobacco comment: 6 cigarettes daily   Substance Use Topics  . Alcohol use: No  . Drug use: No    Review of Systems  Constitutional: Positive for appetite change and fatigue. Negative for activity change, chills, diaphoresis, fever and unexpected weight change.  HENT: Negative.   Eyes: Negative.   Respiratory: Negative.     Cardiovascular: Positive for leg swelling. Negative for chest pain and palpitations.  Gastrointestinal: Positive for nausea. Negative for abdominal distention, abdominal pain, anal bleeding, blood in stool, constipation, diarrhea, rectal pain and vomiting.  Endocrine: Negative.   Genitourinary: Negative.   Musculoskeletal: Negative.   Skin: Positive for rash. Negative for color change, pallor and wound.  Allergic/Immunologic: Negative.   Neurological: Negative.   Hematological: Negative.   Psychiatric/Behavioral: Negative.    Per HPI unless specifically indicated above     Objective:    BP (!) 144/92 (BP Location: Right Arm, Patient Position: Sitting, Cuff Size: Normal)   Pulse 74   Temp 97.7 F (36.5 C) (Temporal)   Resp 16   Ht 5\' 10"  (1.778 m)   Wt 117 lb (53.1 kg)   SpO2 100%   BMI 16.79 kg/m   Wt Readings from Last 3 Encounters:  09/19/19 117 lb (53.1 kg)  09/16/19 117 lb (53.1 kg)  08/28/19 119 lb (54 kg)    Physical Exam Vitals reviewed.  Constitutional:      General: He is not in acute distress.    Appearance: Normal appearance. He is well-developed, well-groomed and underweight. He is not ill-appearing or toxic-appearing.  HENT:     Head: Normocephalic.  Eyes:     General: Lids are normal. Vision grossly intact.        Right eye: No discharge.  Left eye: No discharge.     Extraocular Movements: Extraocular movements intact.     Conjunctiva/sclera: Conjunctivae normal.     Pupils: Pupils are equal, round, and reactive to light.  Cardiovascular:     Rate and Rhythm: Normal rate and regular rhythm.     Pulses: Normal pulses.     Heart sounds: Normal heart sounds. No murmur. No friction rub. No gallop.   Pulmonary:     Effort: Pulmonary effort is normal. No respiratory distress.     Breath sounds: Normal breath sounds.  Abdominal:     General: Abdomen is flat. Bowel sounds are normal. There is no distension.     Palpations: Abdomen is soft. There is  no hepatomegaly, splenomegaly or mass.     Tenderness: There is no abdominal tenderness. There is no guarding or rebound.     Hernia: No hernia is present.  Musculoskeletal:        General: No tenderness.     Right lower leg: 1+ Edema present.     Left lower leg: 1+ Edema present.     Comments: No pain with full active ROM for bilateral elbows  Feet:     Right foot:     Skin integrity: Skin integrity normal.     Left foot:     Skin integrity: Skin integrity normal.  Lymphadenopathy:     Cervical: No cervical adenopathy.  Skin:    General: Skin is warm and dry.     Capillary Refill: Capillary refill takes less than 2 seconds.     Findings: Rash present.     Comments: Redness noted to bilateral elbows without swelling, uticaria, excoriations.  No s/s of septic joint or cellulitis  Neurological:     General: No focal deficit present.     Mental Status: He is alert and oriented to person, place, and time.     Cranial Nerves: No cranial nerve deficit.     Sensory: No sensory deficit.     Motor: No weakness.     Coordination: Coordination normal.     Gait: Gait normal.  Psychiatric:        Attention and Perception: Attention and perception normal.        Mood and Affect: Mood and affect normal.        Speech: Speech normal.        Behavior: Behavior normal. Behavior is cooperative.        Thought Content: Thought content normal.        Cognition and Memory: Cognition and memory normal.        Judgment: Judgment normal.     Results for orders placed or performed in visit on 09/19/19  POCT Urinalysis Dipstick  Result Value Ref Range   Color, UA Yellow    Clarity, UA clear    Glucose, UA Negative Negative   Bilirubin, UA negative    Ketones, UA negative    Spec Grav, UA 1.010 1.010 - 1.025   Blood, UA large    pH, UA 5.0 5.0 - 8.0   Protein, UA Positive (A) Negative   Urobilinogen, UA 0.2 0.2 or 1.0 E.U./dL   Nitrite, UA negative    Leukocytes, UA Negative Negative    Appearance     Odor    POCT glycosylated hemoglobin (Hb A1C)  Result Value Ref Range   Hemoglobin A1C 6.2 (A) 4.0 - 5.6 %   HbA1c POC (<> result, manual entry)     HbA1c, POC (prediabetic  range)     HbA1c, POC (controlled diabetic range)        Assessment & Plan:   Problem List Items Addressed This Visit      Other   Fatigue    Likely fatigue related to nausea and decreased calorie consumption.  Discussed with patient and daughter will send in Rx for phenergan 12.5mg  to take 1/2 tablet (6.25mg ) every 8 hours as needed for nausea.  Once an hour has passed from taking medications to begin trying to increase calorie consumption and should see an decrease in fatigue and increase in energy.    Bilateral lower extremity edema is slightly less than 1+ but is wearing tight fitting socks that can give the impression that swelling is greater than it is.  EKG was completed in clinic and similar to EKG completed in patient on 08/14/2019 and reviewed with Dr. Parks Ranger in clinic.  Urine showing proteinuria, will send to the lab for further testing and if confirmed at lab, will discuss referral to nephrology  A1C in clinic showing patient as prediabetic at 6.2%.  Will re-evaluate in 3 months with additional A1C.    Rash appears as dermatitis.  Patient and daughter unsure if pt's wife has switched the laundry detergent.  Plan: 1. Labs drawn today and will contact with results 2. To contact cardiology and vascular to provide update and schedule follow up 3. To discuss with cardiology if they would like him to switch from amlodipine to another antihypertensive due to concern of ankle swelling 4. Will follow up once labs are resulted      Relevant Orders   Thyroid Panel With TSH    Other Visit Diagnoses    Long-term use of high-risk medication    -  Primary   Relevant Orders   POCT Urinalysis Dipstick (Completed)   Thyroid Panel With TSH   Abnormal urinalysis       Relevant Orders    Urinalysis, microscopic only   Hypertension, unspecified type       Relevant Orders   EKG 12-Lead   COMPLETE METABOLIC PANEL WITH GFR   CBC with Differential   Elevated glucose       Relevant Orders   POCT glycosylated hemoglobin (Hb A1C) (Completed)   Nausea       Relevant Medications   promethazine (PHENERGAN) 12.5 MG tablet      Meds ordered this encounter  Medications  . promethazine (PHENERGAN) 12.5 MG tablet    Sig: Take 0.5 tablets (6.25 mg total) by mouth every 8 (eight) hours as needed for nausea or vomiting.    Dispense:  20 tablet    Refill:  0      Follow up plan: Return in about 4 weeks (around 10/17/2019) for Follow up fatigue.   Harlin Rain, [place] Family Nurse Practitioner [facility] 09/19/2019, 1:26 PM

## 2019-09-19 NOTE — Patient Instructions (Signed)
As we discussed, have your labs drawn in the next 1-2 weeks and we will contact you with the results.  As we discussed, follow up with Dr. [name] with Cardiology, he may want to switch you off of Amlodipine and onto a different medication for blood pressure.  Be sure to take your blood pressure medications as directed.  I have sent in phenergan 6.25mg , to take this every 8 hours as needed for nausea.  Once your nausea has decreased, you should be able to increase your calorie intake to help with your fatigue.  As we discussed, your labs are showing prediabetes, we can recheck your A1C again in 3 months.  We will plan to see you back in 1 month for follow up on fatigue  You will receive a survey after today's visit either digitally by e-mail or paper by Dent mail. Your experiences and feedback matter to [place].  Please respond so we know how we are doing as we provide care for you.  Call us with any questions/concerns/needs.  It is my goal to be available to you for your health concerns.  Thanks for choosing me to be a partner in your healthcare needs!  Harlin Rain, FNP-C Family Nurse Practitioner [facility] Phone: [phone]

## 2019-09-20 ENCOUNTER — Telehealth (INDEPENDENT_AMBULATORY_CARE_PROVIDER_SITE_OTHER): Payer: Self-pay | Admitting: Nurse Practitioner

## 2019-09-20 ENCOUNTER — Encounter: Payer: Self-pay | Admitting: Family Medicine

## 2019-09-20 ENCOUNTER — Other Ambulatory Visit: Payer: Self-pay

## 2019-09-20 ENCOUNTER — Emergency Department: Payer: Medicare Other

## 2019-09-20 ENCOUNTER — Telehealth: Payer: Self-pay

## 2019-09-20 ENCOUNTER — Encounter: Payer: Self-pay | Admitting: Emergency Medicine

## 2019-09-20 ENCOUNTER — Telehealth: Payer: Self-pay | Admitting: Family Medicine

## 2019-09-20 ENCOUNTER — Inpatient Hospital Stay
Admission: EM | Admit: 2019-09-20 | Discharge: 2019-09-30 | DRG: 698 | Disposition: A | Discharge status: Home / Self Care | Payer: Medicare Other | Attending: Internal Medicine | Admitting: Internal Medicine

## 2019-09-20 DIAGNOSIS — N1831 Chronic kidney disease, stage 3a: Secondary | ICD-10-CM | POA: Diagnosis present

## 2019-09-20 DIAGNOSIS — D721 Eosinophilia, unspecified: Secondary | ICD-10-CM | POA: Diagnosis present

## 2019-09-20 DIAGNOSIS — Z681 Body mass index (BMI) 19 or less, adult: Secondary | ICD-10-CM | POA: Diagnosis not present

## 2019-09-20 DIAGNOSIS — D631 Anemia in chronic kidney disease: Secondary | ICD-10-CM | POA: Diagnosis present

## 2019-09-20 DIAGNOSIS — R0602 Shortness of breath: Secondary | ICD-10-CM

## 2019-09-20 DIAGNOSIS — E782 Mixed hyperlipidemia: Secondary | ICD-10-CM | POA: Diagnosis present

## 2019-09-20 DIAGNOSIS — I7581 Atheroembolism of kidney: Secondary | ICD-10-CM | POA: Diagnosis present

## 2019-09-20 DIAGNOSIS — I7300 Raynaud's syndrome without gangrene: Secondary | ICD-10-CM | POA: Diagnosis present

## 2019-09-20 DIAGNOSIS — R410 Disorientation, unspecified: Secondary | ICD-10-CM | POA: Diagnosis present

## 2019-09-20 DIAGNOSIS — I340 Nonrheumatic mitral (valve) insufficiency: Secondary | ICD-10-CM | POA: Diagnosis not present

## 2019-09-20 DIAGNOSIS — I5031 Acute diastolic (congestive) heart failure: Secondary | ICD-10-CM | POA: Diagnosis present

## 2019-09-20 DIAGNOSIS — T426X5A Adverse effect of other antiepileptic and sedative-hypnotic drugs, initial encounter: Secondary | ICD-10-CM | POA: Diagnosis present

## 2019-09-20 DIAGNOSIS — I272 Pulmonary hypertension, unspecified: Secondary | ICD-10-CM | POA: Diagnosis present

## 2019-09-20 DIAGNOSIS — N189 Chronic kidney disease, unspecified: Secondary | ICD-10-CM

## 2019-09-20 DIAGNOSIS — Z720 Tobacco use: Secondary | ICD-10-CM | POA: Diagnosis not present

## 2019-09-20 DIAGNOSIS — N179 Acute kidney failure, unspecified: Secondary | ICD-10-CM | POA: Diagnosis not present

## 2019-09-20 DIAGNOSIS — M7989 Other specified soft tissue disorders: Secondary | ICD-10-CM | POA: Diagnosis present

## 2019-09-20 DIAGNOSIS — N170 Acute kidney failure with tubular necrosis: Secondary | ICD-10-CM | POA: Diagnosis present

## 2019-09-20 DIAGNOSIS — R0902 Hypoxemia: Secondary | ICD-10-CM

## 2019-09-20 DIAGNOSIS — D696 Thrombocytopenia, unspecified: Secondary | ICD-10-CM | POA: Diagnosis not present

## 2019-09-20 DIAGNOSIS — R001 Bradycardia, unspecified: Secondary | ICD-10-CM | POA: Diagnosis not present

## 2019-09-20 DIAGNOSIS — I130 Hypertensive heart and chronic kidney disease with heart failure and stage 1 through stage 4 chronic kidney disease, or unspecified chronic kidney disease: Secondary | ICD-10-CM | POA: Diagnosis present

## 2019-09-20 DIAGNOSIS — Z7982 Long term (current) use of aspirin: Secondary | ICD-10-CM

## 2019-09-20 DIAGNOSIS — F17210 Nicotine dependence, cigarettes, uncomplicated: Secondary | ICD-10-CM | POA: Diagnosis present

## 2019-09-20 DIAGNOSIS — E785 Hyperlipidemia, unspecified: Secondary | ICD-10-CM | POA: Diagnosis present

## 2019-09-20 DIAGNOSIS — Z9889 Other specified postprocedural states: Secondary | ICD-10-CM | POA: Diagnosis not present

## 2019-09-20 DIAGNOSIS — Z8249 Family history of ischemic heart disease and other diseases of the circulatory system: Secondary | ICD-10-CM

## 2019-09-20 DIAGNOSIS — E876 Hypokalemia: Secondary | ICD-10-CM | POA: Diagnosis present

## 2019-09-20 DIAGNOSIS — J9601 Acute respiratory failure with hypoxia: Secondary | ICD-10-CM | POA: Diagnosis present

## 2019-09-20 DIAGNOSIS — Z83438 Family history of other disorder of lipoprotein metabolism and other lipidemia: Secondary | ICD-10-CM

## 2019-09-20 DIAGNOSIS — E43 Unspecified severe protein-calorie malnutrition: Secondary | ICD-10-CM | POA: Diagnosis present

## 2019-09-20 DIAGNOSIS — Z20822 Contact with and (suspected) exposure to covid-19: Secondary | ICD-10-CM | POA: Diagnosis present

## 2019-09-20 DIAGNOSIS — N183 Chronic kidney disease, stage 3 unspecified: Secondary | ICD-10-CM | POA: Diagnosis not present

## 2019-09-20 DIAGNOSIS — J449 Chronic obstructive pulmonary disease, unspecified: Secondary | ICD-10-CM | POA: Diagnosis present

## 2019-09-20 DIAGNOSIS — I10 Essential (primary) hypertension: Secondary | ICD-10-CM

## 2019-09-20 DIAGNOSIS — Z992 Dependence on renal dialysis: Secondary | ICD-10-CM | POA: Diagnosis not present

## 2019-09-20 DIAGNOSIS — D649 Anemia, unspecified: Secondary | ICD-10-CM | POA: Diagnosis not present

## 2019-09-20 DIAGNOSIS — R319 Hematuria, unspecified: Secondary | ICD-10-CM

## 2019-09-20 DIAGNOSIS — I361 Nonrheumatic tricuspid (valve) insufficiency: Secondary | ICD-10-CM | POA: Diagnosis not present

## 2019-09-20 DIAGNOSIS — I739 Peripheral vascular disease, unspecified: Secondary | ICD-10-CM | POA: Diagnosis present

## 2019-09-20 DIAGNOSIS — N186 End stage renal disease: Secondary | ICD-10-CM | POA: Diagnosis not present

## 2019-09-20 DIAGNOSIS — Z79899 Other long term (current) drug therapy: Secondary | ICD-10-CM

## 2019-09-20 DIAGNOSIS — Z8679 Personal history of other diseases of the circulatory system: Secondary | ICD-10-CM

## 2019-09-20 HISTORY — DX: Peripheral vascular disease, unspecified: I73.9

## 2019-09-20 LAB — CBC WITH DIFFERENTIAL/PLATELET
Abs Immature Granulocytes: 0.05 10*3/uL (ref 0.00–0.07)
Absolute Monocytes: 588 cells/uL (ref 200–950)
Basophils Absolute: 59 cells/uL (ref 0–200)
Basophils Absolute: 0.1 10*3/uL (ref 0.0–0.1)
Basophils Relative: 0.6 %
Basophils Relative: 1 %
Eosinophils Absolute: 1068 cells/uL — ABNORMAL HIGH (ref 15–500)
Eosinophils Absolute: 1.1 10*3/uL — ABNORMAL HIGH (ref 0.0–0.5)
Eosinophils Relative: 10.9 %
Eosinophils Relative: 11 %
HCT: 36.8 % — ABNORMAL LOW (ref 38.5–50.0)
HCT: 33.8 % — ABNORMAL LOW (ref 39.0–52.0)
Hemoglobin: 12.1 g/dL — ABNORMAL LOW (ref 13.2–17.1)
Hemoglobin: 11.7 g/dL — ABNORMAL LOW (ref 13.0–17.0)
Immature Granulocytes: 1 %
Lymphocytes Relative: 12 %
Lymphs Abs: 1127 cells/uL (ref 850–3900)
Lymphs Abs: 1.2 10*3/uL (ref 0.7–4.0)
MCH: 30.8 pg (ref 27.0–33.0)
MCH: 30.9 pg (ref 26.0–34.0)
MCHC: 32.9 g/dL (ref 32.0–36.0)
MCHC: 34.6 g/dL (ref 30.0–36.0)
MCV: 93.6 fL (ref 80.0–100.0)
MCV: 89.2 fL (ref 80.0–100.0)
MPV: 10.9 fL (ref 7.5–12.5)
Monocytes Absolute: 0.7 10*3/uL (ref 0.1–1.0)
Monocytes Relative: 6 %
Monocytes Relative: 7 %
Neutro Abs: 6958 cells/uL (ref 1500–7800)
Neutro Abs: 7 10*3/uL (ref 1.7–7.7)
Neutrophils Relative %: 71 %
Neutrophils Relative %: 68 %
Platelets: 152 10*3/uL (ref 140–400)
Platelets: 153 10*3/uL (ref 150–400)
RBC: 3.93 10*6/uL — ABNORMAL LOW (ref 4.20–5.80)
RBC: 3.79 MIL/uL — ABNORMAL LOW (ref 4.22–5.81)
RDW: 15.5 % — ABNORMAL HIGH (ref 11.0–15.0)
RDW: 16.6 % — ABNORMAL HIGH (ref 11.5–15.5)
Total Lymphocyte: 11.5 %
WBC: 9.8 10*3/uL (ref 3.8–10.8)
WBC: 10.1 10*3/uL (ref 4.0–10.5)
nRBC: 0 % (ref 0.0–0.2)

## 2019-09-20 LAB — URINALYSIS, COMPLETE (UACMP) WITH MICROSCOPIC
Bacteria, UA: NONE SEEN
Bilirubin Urine: NEGATIVE
Glucose, UA: NEGATIVE mg/dL
Hgb urine dipstick: NEGATIVE
Ketones, ur: NEGATIVE mg/dL
Leukocytes,Ua: NEGATIVE
Nitrite: NEGATIVE
Protein, ur: >=300 mg/dL — AB
Specific Gravity, Urine: 1.009 (ref 1.005–1.030)
Squamous Epithelial / HPF: NONE SEEN (ref 0–5)
pH: 6 (ref 5.0–8.0)

## 2019-09-20 LAB — COMPREHENSIVE METABOLIC PANEL
ALT: 41 U/L (ref 0–44)
AST: 30 U/L (ref 15–41)
Albumin: 3.7 g/dL (ref 3.5–5.0)
Alkaline Phosphatase: 156 U/L — ABNORMAL HIGH (ref 38–126)
Anion gap: 13 (ref 5–15)
BUN: 56 mg/dL — ABNORMAL HIGH (ref 8–23)
CO2: 24 mmol/L (ref 22–32)
Calcium: 8.9 mg/dL (ref 8.9–10.3)
Chloride: 102 mmol/L (ref 98–111)
Creatinine, Ser: 5.03 mg/dL — ABNORMAL HIGH (ref 0.61–1.24)
GFR calc Af Amer: 12 mL/min — ABNORMAL LOW (ref >60)
GFR calc non Af Amer: 11 mL/min — ABNORMAL LOW (ref >60)
Glucose, Bld: 125 mg/dL — ABNORMAL HIGH (ref 70–99)
Potassium: 3.5 mmol/L (ref 3.5–5.1)
Sodium: 139 mmol/L (ref 135–145)
Total Bilirubin: 1.1 mg/dL (ref 0.3–1.2)
Total Protein: 7.2 g/dL (ref 6.5–8.1)

## 2019-09-20 LAB — TROPONIN I (HIGH SENSITIVITY): Troponin I (High Sensitivity): 33 ng/L — ABNORMAL HIGH (ref <18)

## 2019-09-20 LAB — COMPLETE METABOLIC PANEL WITH GFR
AG Ratio: 1.4 (calc) (ref 1.0–2.5)
ALT: 35 U/L (ref 9–46)
AST: 25 U/L (ref 10–35)
Albumin: 4 g/dL (ref 3.6–5.1)
Alkaline phosphatase (APISO): 152 U/L — ABNORMAL HIGH (ref 35–144)
BUN/Creatinine Ratio: 12 (calc) (ref 6–22)
BUN: 54 mg/dL — ABNORMAL HIGH (ref 7–25)
CO2: 25 mmol/L (ref 20–32)
Calcium: 9.2 mg/dL (ref 8.6–10.3)
Chloride: 103 mmol/L (ref 98–110)
Creat: 4.69 mg/dL — ABNORMAL HIGH (ref 0.70–1.18)
GFR, Est African American: 13 mL/min/{1.73_m2} — ABNORMAL LOW (ref >=60)
GFR, Est Non African American: 11 mL/min/{1.73_m2} — ABNORMAL LOW (ref >=60)
Globulin: 2.8 g/dL (calc) (ref 1.9–3.7)
Glucose, Bld: 120 mg/dL — ABNORMAL HIGH (ref 65–99)
Potassium: 3.6 mmol/L (ref 3.5–5.3)
Sodium: 139 mmol/L (ref 135–146)
Total Bilirubin: 0.7 mg/dL (ref 0.2–1.2)
Total Protein: 6.8 g/dL (ref 6.1–8.1)

## 2019-09-20 LAB — URINALYSIS, MICROSCOPIC ONLY
Bacteria, UA: NONE SEEN /HPF
Hyaline Cast: NONE SEEN /LPF
Squamous Epithelial / HPF: NONE SEEN /HPF (ref <=5)

## 2019-09-20 LAB — CK: Total CK: 59 U/L (ref 49–397)

## 2019-09-20 LAB — RESPIRATORY PANEL BY RT PCR (FLU A&B, COVID)
Influenza A by PCR: NEGATIVE
Influenza B by PCR: NEGATIVE
SARS Coronavirus 2 by RT PCR: NEGATIVE

## 2019-09-20 LAB — THYROID PANEL WITH TSH
Free Thyroxine Index: 3.2 (ref 1.4–3.8)
T3 Uptake: 34 % (ref 22–35)
T4, Total: 9.3 ug/dL (ref 4.9–10.5)
TSH: 2.86 mIU/L (ref 0.40–4.50)

## 2019-09-20 MED ORDER — AMLODIPINE BESYLATE 5 MG PO TABS
5.0000 mg | ORAL_TABLET | Freq: Every day | ORAL | Status: DC
  Administered 2019-09-20: 5 mg via ORAL
  Filled 2019-09-20: qty 1

## 2019-09-20 MED ORDER — LABETALOL HCL 5 MG/ML IV SOLN
10.0000 mg | INTRAVENOUS | Status: DC | PRN
  Administered 2019-09-20 – 2019-09-21 (×2): 10 mg via INTRAVENOUS
  Filled 2019-09-20 (×2): qty 4

## 2019-09-20 MED ORDER — ACETAMINOPHEN 500 MG PO TABS
500.0000 mg | ORAL_TABLET | Freq: Three times a day (TID) | ORAL | Status: DC | PRN
  Administered 2019-09-20 – 2019-09-26 (×4): 500 mg via ORAL
  Filled 2019-09-20 (×4): qty 1

## 2019-09-20 MED ORDER — SODIUM CHLORIDE 0.9 % IV SOLN: INTRAVENOUS | Status: DC

## 2019-09-20 MED ORDER — HEPARIN SODIUM (PORCINE) 5000 UNIT/ML IJ SOLN
5000.0000 [IU] | Freq: Three times a day (TID) | INTRAMUSCULAR | Status: DC
  Administered 2019-09-20: 5000 [IU] via SUBCUTANEOUS
  Filled 2019-09-20 (×3): qty 1

## 2019-09-20 MED ORDER — ONDANSETRON HCL 4 MG/2ML IJ SOLN
4.0000 mg | Freq: Four times a day (QID) | INTRAMUSCULAR | Status: DC | PRN
  Filled 2019-09-20: qty 2

## 2019-09-20 MED ORDER — ONDANSETRON HCL 4 MG PO TABS: 4.0000 mg | ORAL_TABLET | Freq: Four times a day (QID) | ORAL | Status: DC | PRN

## 2019-09-20 MED ORDER — ADULT MULTIVITAMIN W/MINERALS CH
1.0000 | ORAL_TABLET | Freq: Every day | ORAL | Status: DC
  Administered 2019-09-21 – 2019-09-27 (×7): 1 via ORAL
  Filled 2019-09-20 (×7): qty 1

## 2019-09-20 MED ORDER — SODIUM CHLORIDE 0.9 % IV SOLN: Freq: Once | INTRAVENOUS | Status: AC

## 2019-09-20 NOTE — Consult Note (Signed)
[facility]  CONSULT NOTE    Date: 09/20/2019                  Patient Name:  Scott Oconnor  MRN: [id number]  DOB: 22-Nov-1945  Age / Sex: 74 y.o., male         PCP: Verl Bangs, FNP                 Service Requesting Consult: Dr. Leslye Peer                 Reason for Consult: Acute renal failure            History of Present Illness: Scott Oconnor had AAA endovascular endoprosthesis placed on 3/24. Patient then returned to ED on 4/7 for back pain and abdominal pain. CT scan with contrast was done. Creatinine was 1.66 at that time.   Patient states he has been weak, tired, not eating, poor appetite, nausea, dysguesia, and now not urinating much. He has been trying several home remedies including Barley. Endorses significant weight loss.   Daughter at bedside who assists with history taking.   Recently started on amlodipine and rosuvastatin prior to his surgery.    Medications: Outpatient medications: Medications Prior to Admission  Medication Sig Dispense Refill Last Dose  . acetaminophen (TYLENOL) 500 MG tablet Take 500 mg by mouth every 8 (eight) hours as needed for moderate pain.    Unknown at PRN  . amLODipine (NORVASC) 5 MG tablet Take 1 tablet (5 mg total) by mouth daily. 30 tablet 11   . aspirin EC 81 MG tablet Take 81 mg by mouth at bedtime.      . Multiple Vitamin (MULTIVITAMIN) capsule Take 1 capsule by mouth daily.     . promethazine (PHENERGAN) 12.5 MG tablet Take 0.5 tablets (6.25 mg total) by mouth every 8 (eight) hours as needed for nausea or vomiting. 20 tablet 0 Unknown at PRN    Current medications: Current Facility-Administered Medications  Medication Dose Route Frequency Provider Last Rate Last Admin  . 0.9 %  sodium chloride infusion   Intravenous Continuous Loletha Grayer, MD 60 mL/hr at 09/20/19 1505 New Bag at 09/20/19 1505  . acetaminophen (TYLENOL) tablet 500 mg  500 mg Oral Q8H PRN Wieting, Richard, MD      .  amLODipine (NORVASC) tablet 5 mg  5 mg Oral Daily Wieting, Richard, MD   5 mg at 09/20/19 1505  . heparin injection 5,000 Units  5,000 Units Subcutaneous Q8H Wieting, Richard, MD      . Derrill Memo ON 09/21/2019] multivitamin with minerals tablet 1 tablet  1 tablet Oral Daily Wieting, Richard, MD      . ondansetron Avala) tablet 4 mg  4 mg Oral Q6H PRN Wieting, Richard, MD       Or  . ondansetron (ZOFRAN) injection 4 mg  4 mg Intravenous Q6H PRN Loletha Grayer, MD          Allergies: No Known Allergies    Past Medical History: Past Medical History:  Diagnosis Date  . Hyperlipidemia   . Hypertension   . PVD (peripheral vascular disease) ([place])   . Reynolds syndrome [facility])      Past Surgical History: Past Surgical History:  Procedure Laterality Date  . ENDOVASCULAR REPAIR/STENT GRAFT N/A 08/14/2019   Procedure: ENDOVASCULAR REPAIR/STENT GRAFT;  Surgeon: Katha Cabal, MD;  Location: [place] CV LAB;  Service: Cardiovascular;  Laterality: N/A;     Family History:  Family History  Problem Relation Age of Onset  . Heart disease Father   . Sudden Cardiac Death Father   . Heart disease Brother   . Hyperlipidemia Brother   . Heart disease Sister   . Heart disease Brother   . Hyperlipidemia Brother   . Hyperlipidemia Brother   . Hyperlipidemia Brother   . Hyperlipidemia Brother   . Hyperlipidemia Brother   . Heart disease Sister      Social History: Social History   Socioeconomic History  . Marital status: Married    Spouse name: Not on file  . Number of children: Not on file  . Years of education: Not on file  . Highest education level: Not on file  Occupational History  . Not on file  Tobacco Use  . Smoking status: Current Every Day Smoker    Packs/day: 0.33    Types: Cigarettes  . Smokeless tobacco: Never Used  . Tobacco comment: 6 cigarettes daily   Substance and Sexual Activity  . Alcohol use: No  . Drug use: No  . Sexual activity: Yes  Other  Topics Concern  . Not on file  Social History Narrative  . Not on file   Social Determinants of Health   Financial Resource Strain:   . Difficulty of Paying Living Expenses:   Food Insecurity:   . Worried About Charity fundraiser in the Last Year:   . Arboriculturist in the Last Year:   Transportation Needs:   . Film/video editor (Medical):   Marland Kitchen Lack of Transportation (Non-Medical):   Physical Activity:   . Days of Exercise per Week:   . Minutes of Exercise per Session:   Stress:   . Feeling of Stress :   Social Connections:   . Frequency of Communication with Friends and Family:   . Frequency of Social Gatherings with Friends and Family:   . Attends Religious Services:   . Active Member of Clubs or Organizations:   . Attends Archivist Meetings:   Marland Kitchen Marital Status:   Intimate Partner Violence:   . Fear of Current or Ex-Partner:   . Emotionally Abused:   Marland Kitchen Physically Abused:   . Sexually Abused:      Review of Systems: ROS  Vital Signs: Blood pressure (!) 193/85, pulse 75, temperature 98.4 F (36.9 C), temperature source Oral, resp. rate 16, height 5\' 10"  (1.778 m), weight 53.5 kg, SpO2 100 %.  Weight trends: Filed Weights   09/20/19 1055  Weight: 53.5 kg    Physical Exam: General: NAD,   Head: Normocephalic, atraumatic. Moist oral mucosal membranes  Eyes: Anicteric, PERRL  Neck: Supple, trachea midline  Lungs:  Clear to auscultation  Heart: Regular rate and rhythm  Abdomen:  Soft, nontender,   Extremities: No peripheral edema.  Neurologic: Nonfocal, moving all four extremities  Skin: No lesions  Access:       Lab results: Basic Metabolic Panel: Recent Labs  Lab 09/19/19 1130 09/20/19 1113  NA 139 139  K 3.6 3.5  CL 103 102  CO2 25 24  GLUCOSE 120* 125*  BUN 54* 56*  CREATININE 4.69* 5.03*  CALCIUM 9.2 8.9    Liver Function Tests: Recent Labs  Lab 09/19/19 1130 09/20/19 1113  AST 25 30  ALT 35 41  ALKPHOS  --  156*   BILITOT 0.7 1.1  PROT 6.8 7.2  ALBUMIN  --  3.7   No results for input(s): LIPASE, AMYLASE in the  last 168 hours. No results for input(s): AMMONIA in the last 168 hours.  CBC: Recent Labs  Lab 09/19/19 1130 09/20/19 1113  WBC 9.8 10.1  NEUTROABS 6,958 7.0  HGB 12.1* 11.7*  HCT 36.8* 33.8*  MCV 93.6 89.2  PLT 152 153    Cardiac Enzymes: Recent Labs  Lab 09/20/19 1136  CKTOTAL 59    BNP: Invalid input(s): POCBNP  CBG: No results for input(s): GLUCAP in the last 168 hours.  Microbiology: Results for orders placed or performed during the hospital encounter of 09/20/19  Respiratory Panel by RT PCR (Flu A&B, Covid) - Nasopharyngeal Swab     Status: None   Collection Time: 09/20/19 11:13 AM   Specimen: Nasopharyngeal Swab  Result Value Ref Range Status   SARS Coronavirus 2 by RT PCR NEGATIVE NEGATIVE Final    Comment: (NOTE) SARS-CoV-2 target nucleic acids are NOT DETECTED. The SARS-CoV-2 RNA is generally detectable in upper respiratoy specimens during the acute phase of infection. The lowest concentration of SARS-CoV-2 viral copies this assay can detect is 131 copies/mL. A negative result does not preclude SARS-Cov-2 infection and should not be used as the sole basis for treatment or other patient management decisions. A negative result may occur with  improper specimen collection/handling, submission of specimen other than nasopharyngeal swab, presence of viral mutation(s) within the areas targeted by this assay, and inadequate number of viral copies (<131 copies/mL). A negative result must be combined with clinical observations, patient history, and epidemiological information. The expected result is Negative. Fact Sheet for Patients:  PinkCheek.be Fact Sheet for Healthcare Providers:  GravelBags.it This test is not yet ap proved or cleared by the [place] FDA and  has been authorized for detection  and/or diagnosis of SARS-CoV-2 by FDA under an Emergency Use Authorization (EUA). This EUA will remain  in effect (meaning this test can be used) for the duration of the COVID-19 declaration under Section 564(b)(1) of the Act, 21 U.S.C. section 360bbb-3(b)(1), unless the authorization is terminated or revoked sooner.    Influenza A by PCR NEGATIVE NEGATIVE Final   Influenza B by PCR NEGATIVE NEGATIVE Final    Comment: (NOTE) The Xpert Xpress SARS-CoV-2/FLU/RSV assay is intended as an aid in  the diagnosis of influenza from Nasopharyngeal swab specimens and  should not be used as a sole basis for treatment. Nasal washings and  aspirates are unacceptable for Xpert Xpress SARS-CoV-2/FLU/RSV  testing. Fact Sheet for Patients: PinkCheek.be Fact Sheet for Healthcare Providers: GravelBags.it This test is not yet approved or cleared by the [place] FDA and  has been authorized for detection and/or diagnosis of SARS-CoV-2 by  FDA under an Emergency Use Authorization (EUA). This EUA will remain  in effect (meaning this test can be used) for the duration of the  Covid-19 declaration under Section 564(b)(1) of the Act, 21  U.S.C. section 360bbb-3(b)(1), unless the authorization is  terminated or revoked. Performed at [facility], [address] [place], [place] [address]     Coagulation Studies: No results for input(s): LABPROT, INR in the last 72 hours.  Urinalysis: Recent Labs    09/19/19 1029 09/20/19 1418  COLORURINE  --  YELLOW*  LABSPEC  --  1.009  PHURINE  --  6.0  GLUCOSEU  --  NEGATIVE  HGBUR  --  NEGATIVE  BILIRUBINUR negative NEGATIVE  KETONESUR  --  NEGATIVE  PROTEINUR Positive* >=300*  UROBILINOGEN 0.2  --   NITRITE negative NEGATIVE  LEUKOCYTESUR Negative NEGATIVE  Imaging: US Renal  Result Date: 09/20/2019 CLINICAL DATA:  Renal failure. EXAM: RENAL / URINARY TRACT ULTRASOUND  COMPLETE COMPARISON:  None. FINDINGS: Right Kidney: Renal measurements: 10.4 x 3.6 x 4.4 cm = volume: 87 mL. Mild cortical increased echogenicity. No mass or hydronephrosis visualized. Left Kidney: Renal measurements: 9.4 x 4.4 x 3.4 cm = volume: 73 mL. Mild increased cortical echogenicity. No mass or hydronephrosis visualized. Bladder: Appears normal for degree of bladder distention. Other: Trace ascites. IMPRESSION: 1. Normal size kidneys without hydronephrosis. Mild bilateral increased cortical echogenicity which can be seen in medical renal disease. 2.  Trace ascites. Electronically Signed   By: Marin Olp M.D.   On: 09/20/2019 13:06   US AORTA DUPLEX LIMITED  Result Date: 09/20/2019 CLINICAL DATA:  74 year old male with a history of abdominal aortic aneurysm, status post EVAR EXAM: ULTRASOUND OF ABDOMINAL AORTA TECHNIQUE: Ultrasound examination of the abdominal aorta was performed to evaluate for abdominal aortic aneurysm. COMPARISON:  CT 08/28/2019 FINDINGS: Abdominal aortic measurements as follows: Proximal:  2.4 cm Mid:  4.4 cm Distal:  4.7 cm Right iliac artery: 1.6 cm Left iliac artery: 1.3 cm Patent EVAR IMPRESSION: Duplex demonstrates patent EVAR. Maximum diameter of the aneurysm sac measures less than 5 cm. Correlation with the baseline aneurysm size may be useful for comparison of any change. Signed, Dulcy Fanny. Dellia Nims, RPVI [facility] Radiology Electronically Signed   By: Corrie Mckusick D.O.   On: 09/20/2019 13:19      Assessment & Plan: Mr. Giann Obara is a 74 y.o. 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 Acute renal failure 2. Chronic kidney disease stage  IIIA: baseline creatinine of 1.28, GFR of 56 on 08/12/19 3. Proteinuria 4. Eosinophilia 5. Anemia with renal failure  Differential includes prerenal azotemia from poor PO intake. However poor appetite and dysguesia could be due to renal failure. IV contrast nephropathy or AIN is also likely. AAA procedure with IV contrast along with CT scan on 4/7.  Ultrasound with no obstruction Discussed with patient, may need renal biopsy.  - Serologic work up.  - Renal artery duplex - Urine for eos.   LOS: 0 Ronzell Laban 4/30/20215:40 PM    Addendum: Spoke in detail about plan to patient's niece who is a physician in [place].

## 2019-09-20 NOTE — ED Notes (Signed)
nephrology at bedside to discuss plan of care with patient and family

## 2019-09-20 NOTE — ED Notes (Signed)
EKG completed. Covid swab walked over to lab. Vss will continue to monitor.

## 2019-09-20 NOTE — ED Provider Notes (Signed)
[facility] Emergency Department Provider Note       Time seen: ----------------------------------------- 11:39 AM on 09/20/2019 -----------------------------------------   I have reviewed the triage vital signs and the nursing notes.  HISTORY Chief Complaint AKI   HPI Scott Oconnor is a 74 y.o. male with a history of hyperlipidemia, hypertension, Raynaud's syndrome, AAA who presents to the ED for acute kidney injury.  Patient had surgery for AAA on 3/24, has had increased blood pressure as well as labs showing acute kidney injury.  Patient received Covid vaccination in the weeks prior to surgery.  He reports still making urine, had CT angiogram performed on April 7 that revealed no endovascular leak.  He has had worsening fatigue and weakness.  Past Medical History:  Diagnosis Date  . Hyperlipidemia   . Hypertension   . Reynolds syndrome [facility])     Patient Active Problem List   Diagnosis Date Noted  . Fatigue 09/19/2019  . AAA (abdominal aortic aneurysm) ([place]) 08/14/2019  . AAA (abdominal aortic aneurysm) without rupture ([place]) 08/01/2019  . PAD (peripheral artery disease) ([place]) 07/24/2019  . Tobacco use disorder 07/24/2019  . Raynaud's disease without gangrene 06/21/2019  . Mixed dyslipidemia 06/15/2018  . Family history of heart attack 06/15/2018  . Smokers' cough ([place]) 06/16/2017    Past Surgical History:  Procedure Laterality Date  . ENDOVASCULAR REPAIR/STENT GRAFT N/A 08/14/2019   Procedure: ENDOVASCULAR REPAIR/STENT GRAFT;  Surgeon: Katha Cabal, MD;  Location: [place] [facility] LAB;  Service: Cardiovascular;  Laterality: N/A;  . NO PAST SURGERIES      Allergies Patient has no known allergies.  Social History Social History   Tobacco Use  . Smoking status: Current Every Day Smoker    Packs/day: 0.33    Types: Cigarettes  . Smokeless tobacco: Never Used  . Tobacco comment: 6 cigarettes daily   Substance Use Topics  .  Alcohol use: No  . Drug use: No    Review of Systems Constitutional: Negative for fever. Cardiovascular: Negative for chest pain. Respiratory: Negative for shortness of breath. Gastrointestinal: Negative for abdominal pain, vomiting and diarrhea. Musculoskeletal: Negative for back pain. Skin: Negative for rash. Neurological: Positive for weakness and fatigue  All systems negative/normal/unremarkable except as stated in the HPI  ____________________________________________   PHYSICAL EXAM:  VITAL SIGNS: ED Triage Vitals  Enc Vitals Group     BP 09/20/19 1054 (!) 184/77     Pulse Rate 09/20/19 1054 73     Resp 09/20/19 1054 18     Temp 09/20/19 1054 98.3 F (36.8 C)     Temp Source 09/20/19 1054 Oral     SpO2 09/20/19 1054 100 %     Weight 09/20/19 1055 118 lb (53.5 kg)     Height 09/20/19 1055 5\' 10"  (1.778 m)     Head Circumference --      Peak Flow --      Pain Score --      Pain Loc --      Pain Edu? --      Excl. in [place]? --     Constitutional: Alert and oriented. Well appearing and in no distress. Eyes: Conjunctivae are normal. Normal extraocular movements. ENT      Head: Normocephalic and atraumatic.      Nose: No congestion/rhinnorhea.      Mouth/Throat: Mucous membranes are moist.      Neck: No stridor. Cardiovascular: Normal rate, regular rhythm. No murmurs, rubs, or gallops. Respiratory: Normal respiratory effort  without tachypnea nor retractions. Breath sounds are clear and equal bilaterally. No wheezes/rales/rhonchi. Gastrointestinal: Soft and nontender. Normal bowel sounds Musculoskeletal: Nontender with normal range of motion in extremities. No lower extremity tenderness nor edema. Neurologic:  Normal speech and language. No gross focal neurologic deficits are appreciated.  Generalized weakness, nothing focal Skin:  Skin is warm, dry and intact. No rash noted. Psychiatric: Mood and affect are normal. Speech and behavior are normal.   ____________________________________________  ED COURSE:  As part of my medical decision making, I reviewed the following data within the [place] History obtained from family if available, nursing notes, old chart and ekg, as well as notes from prior ED visits. Patient presented for weakness and acute kidney injury, we will assess with labs and imaging as indicated at this time.   Procedures  Scott Oconnor was evaluated in Emergency Department on 09/20/2019 for the symptoms described in the history of present illness. He was evaluated in the context of the global COVID-19 pandemic, which necessitated consideration that the patient might be at risk for infection with the SARS-CoV-2 virus that causes COVID-19. Institutional protocols and algorithms that pertain to the evaluation of patients at risk for COVID-19 are in a state of rapid change based on information released by regulatory bodies including the CDC and federal and state organizations. These policies and algorithms were followed during the patient's care in the ED.  ____________________________________________   LABS (pertinent positives/negatives)  Labs Reviewed  CBC WITH DIFFERENTIAL/PLATELET - Abnormal; Notable for the following components:      Result Value   RBC 3.79 (*)    Hemoglobin 11.7 (*)    HCT 33.8 (*)    RDW 16.6 (*)    Eosinophils Absolute 1.1 (*)    All other components within normal limits  COMPREHENSIVE METABOLIC PANEL - Abnormal; Notable for the following components:   Glucose, Bld 125 (*)    BUN 56 (*)    Creatinine, Ser 5.03 (*)    Alkaline Phosphatase 156 (*)    GFR calc non Af Amer 11 (*)    GFR calc Af Amer 12 (*)    All other components within normal limits  TROPONIN I (HIGH SENSITIVITY) - Abnormal; Notable for the following components:   Troponin I (High Sensitivity) 33 (*)    All other components within normal limits  RESPIRATORY PANEL BY RT PCR (FLU A&B, COVID)   URINALYSIS, COMPLETE (UACMP) WITH MICROSCOPIC  CBG MONITORING, ED    RADIOLOGY Images were viewed by me  Ultrasound renal, ultrasound aorta IMPRESSION: 1. Normal size kidneys without hydronephrosis. Mild bilateral increased cortical echogenicity which can be seen in medical renal disease.  2.  Trace ascites. IMPRESSION: Duplex demonstrates patent EVAR.  Maximum diameter of the aneurysm sac measures less than 5 cm. Correlation with the baseline aneurysm size may be useful for comparison of any change. ____________________________________________   DIFFERENTIAL DIAGNOSIS   Dehydration, electrolyte abnormality, occult infection, renal failure, renal infarction  FINAL ASSESSMENT AND PLAN  Acute renal failure   Plan: The patient had presented for weakness and was found to have significant increases in BUN and creatinine compared to prior. Patient's labs do indicate worsening renal function. Patient's imaging hasn't revealed any acute process, we have started IV fluids on the patient.  I discussed with vascular surgery who recommends nephrology consult and admission.   Laurence Aly, MD    Note: This note was generated in part or whole with voice recognition software. Voice recognition  is usually quite accurate but there are transcription errors that can and very often do occur. I apologize for any typographical errors that were not detected and corrected.     Earleen Newport, MD 09/20/19 1326

## 2019-09-20 NOTE — ED Triage Notes (Signed)
Pt via pov from PCP office; pt had surgery for AAA on 4/12; he is having increased BP as well as labs showing he has AKI, based on labs from 4/12 and 4/29. Pt alert & oriented, NAD noted. Denies pain.

## 2019-09-20 NOTE — ED Notes (Signed)
This RN provided pt with meal tray at this time. Pt denies any further needs.

## 2019-09-20 NOTE — Telephone Encounter (Signed)
Telephone call to patient, sending to [facility], patient going with Daughter, [name], through private vehicle.  Discussed AKI and concerns for kidney function with symptoms of fatigue, loss of appetite/nausea and elevated blood pressures.  Daughter in agreement with plan to proceed to the ER for evaluation.    Spoke with Eulogio Ditch, NP with Vascular and provided update.  Telephone call to Triage RN at [facility], [place] and notified of patient en route to ER for work up.

## 2019-09-20 NOTE — H&P (Signed)
[facility] [place] NAME: Scott Oconnor    MR#:  [id number]  DATE OF BIRTH:  [birth year]  DATE OF ADMISSION:  09/20/2019  PRIMARY CARE PHYSICIAN: Verl Bangs, FNP   REQUESTING/REFERRING PHYSICIAN: Dr Cephas Darby  CHIEF COMPLAINT:   Chief Complaint  Patient presents with  . AKI    HISTORY OF PRESENT ILLNESS:  Scott Oconnor  is a 74 y.o. male presenting to the hospital with some swelling in his feet and hands.  He is also been fatigued and nauseous.  He falls asleep easily.  He had a CT scan with contrast on April 7 and has not been feeling well since.  He had a second Covid vaccination on April 8.  He had labs done yesterday that showed an elevated creatinine.  Came into the hospital today from not feeling well and still has elevated creatinine.  He had a recent AAA surgery on March 24.  Patient also having 10 to 15 pound weight loss and poor appetite.  Hospitalist services were contacted for admission for acute kidney injury.  PAST MEDICAL HISTORY:   Past Medical History:  Diagnosis Date  . Hyperlipidemia   . Hypertension   . PVD (peripheral vascular disease) ([place])   . Reynolds syndrome [facility])     PAST SURGICAL HISTORY:   Past Surgical History:  Procedure Laterality Date  . ENDOVASCULAR REPAIR/STENT GRAFT N/A 08/14/2019   Procedure: ENDOVASCULAR REPAIR/STENT GRAFT;  Surgeon: Katha Cabal, MD;  Location: [facility];  Service: Cardiovascular;  Laterality: N/A;    SOCIAL HISTORY:   Social History   Tobacco Use  . Smoking status: Current Every Day Smoker    Packs/day: 0.33    Types: Cigarettes  . Smokeless tobacco: Never Used  . Tobacco comment: 6 cigarettes daily   Substance Use Topics  . Alcohol use: No    FAMILY HISTORY:   Family History  Problem Relation Age of Onset  . Heart disease Father   . Sudden Cardiac Death Father   . Heart disease Brother   . Hyperlipidemia Brother   .  Heart disease Sister   . Heart disease Brother   . Hyperlipidemia Brother   . Hyperlipidemia Brother   . Hyperlipidemia Brother   . Hyperlipidemia Brother   . Hyperlipidemia Brother   . Heart disease Sister     DRUG ALLERGIES:  No Known Allergies  REVIEW OF SYSTEMS:  CONSTITUTIONAL: No fever, chills or sweats.  Positive for 10 to 15 pound weight loss.  Positive for fatigue and weakness.  EYES: No blurred or double vision.  Wears glasses. EARS, NOSE, AND THROAT: No tinnitus or ear pain. No sore throat RESPIRATORY: No cough, shortness of breath, wheezing or hemoptysis.  CARDIOVASCULAR: No chest pain, orthopnea, edema.  GASTROINTESTINAL: Positive for nausea.no vomiting, diarrhea or abdominal pain. No blood in bowel movements GENITOURINARY: No dysuria.  Told he has blood in the urine but he does not say it ENDOCRINE: No polyuria, nocturia,  HEMATOLOGY: No anemia, easy bruising or bleeding SKIN: No rash or lesion. MUSCULOSKELETAL: No joint pain or arthritis.   NEUROLOGIC: No tingling, numbness, weakness.  PSYCHIATRY: No anxiety or depression.   MEDICATIONS AT HOME:   Prior to Admission medications   Medication Sig Start Date End Date Taking? Authorizing Provider  acetaminophen (TYLENOL) 500 MG tablet Take 500 mg by mouth every 8 (eight) hours as needed for moderate pain.    Yes [provider]  amLODipine (NORVASC) 5  MG tablet Take 1 tablet (5 mg total) by mouth daily. 07/22/19  Yes Schnier, Dolores Lory, MD  aspirin EC 81 MG tablet Take 81 mg by mouth at bedtime.    Yes [provider]  Multiple Vitamin (MULTIVITAMIN) capsule Take 1 capsule by mouth daily.   Yes [provider]  promethazine (PHENERGAN) 12.5 MG tablet Take 0.5 tablets (6.25 mg total) by mouth every 8 (eight) hours as needed for nausea or vomiting. 09/19/19  Yes Malfi, Lupita Raider, FNP      VITAL SIGNS:  Blood pressure (!) 184/77, pulse 73, temperature 98.3 F (36.8 C), temperature source Oral,  resp. rate 18, height 5\' 10"  (1.778 m), weight 53.5 kg, SpO2 99 %.  PHYSICAL EXAMINATION:  GENERAL:  74 y.o.-year-old patient lying in the bed with no acute distress.  EYES: Pupils equal, round, reactive to light and accommodation. No scleral icterus. Extraocular muscles intact.  HEENT: Head atraumatic, normocephalic. Oropharynx and nasopharynx clear.  NECK:  Supple, no jugular venous distention. No thyroid enlargement, no tenderness.  LUNGS: Normal breath sounds bilaterally, no wheezing, rales,rhonchi or crepitation. No use of accessory muscles of respiration.  CARDIOVASCULAR: S1, S2 normal. No murmurs, rubs, or gallops.  ABDOMEN: Soft, nontender, nondistended. Bowel sounds present. No organomegaly or mass.  EXTREMITIES: No pedal edema, cyanosis, or clubbing.  NEUROLOGIC: Cranial nerves II through XII are intact. Muscle strength 5/5 in all extremities. Sensation intact. Gait not checked.  PSYCHIATRIC: The patient is alert and oriented x 3.  SKIN: Blanching rash on his arms  LABORATORY PANEL:   CBC Recent Labs  Lab 09/20/19 1113  WBC 10.1  HGB 11.7*  HCT 33.8*  PLT 153   ------------------------------------------------------------------------------------------------------------------  Chemistries  Recent Labs  Lab 09/20/19 1113  NA 139  K 3.5  CL 102  CO2 24  GLUCOSE 125*  BUN 56*  CREATININE 5.03*  CALCIUM 8.9  AST 30  ALT 41  ALKPHOS 156*  BILITOT 1.1   ------------------------------------------------------------------------------------------------------------------    RADIOLOGY:  US Renal  Result Date: 09/20/2019 CLINICAL DATA:  Renal failure. EXAM: RENAL / URINARY TRACT ULTRASOUND COMPLETE COMPARISON:  None. FINDINGS: Right Kidney: Renal measurements: 10.4 x 3.6 x 4.4 cm = volume: 87 mL. Mild cortical increased echogenicity. No mass or hydronephrosis visualized. Left Kidney: Renal measurements: 9.4 x 4.4 x 3.4 cm = volume: 73 mL. Mild increased cortical  echogenicity. No mass or hydronephrosis visualized. Bladder: Appears normal for degree of bladder distention. Other: Trace ascites. IMPRESSION: 1. Normal size kidneys without hydronephrosis. Mild bilateral increased cortical echogenicity which can be seen in medical renal disease. 2.  Trace ascites. Electronically Signed   By: Marin Olp M.D.   On: 09/20/2019 13:06   US AORTA DUPLEX LIMITED  Result Date: 09/20/2019 CLINICAL DATA:  74 year old male with a history of abdominal aortic aneurysm, status post EVAR EXAM: ULTRASOUND OF ABDOMINAL AORTA TECHNIQUE: Ultrasound examination of the abdominal aorta was performed to evaluate for abdominal aortic aneurysm. COMPARISON:  CT 08/28/2019 FINDINGS: Abdominal aortic measurements as follows: Proximal:  2.4 cm Mid:  4.4 cm Distal:  4.7 cm Right iliac artery: 1.6 cm Left iliac artery: 1.3 cm Patent EVAR IMPRESSION: Duplex demonstrates patent EVAR. Maximum diameter of the aneurysm sac measures less than 5 cm. Correlation with the baseline aneurysm size may be useful for comparison of any change. Signed, Dulcy Fanny. Dellia Nims, RPVI [facility] Electronically Signed   By: Corrie Mckusick D.O.   On: 09/20/2019 13:19  EKG:   Sinus arrhythmia 84 bpm  IMPRESSION AND PLAN:   1.  Acute kidney injury on chronic kidney disease stage III.  Patient did have a contrasted CT scan on 08/28/2019 and has not felt well since with poor appetite and weight loss.  Creatinine today 5.03 with a GFR of 11.  We will give gentle IV fluids.  ER physician spoke with nephrology.  Renal ultrasound showing medical renal disease. 2.  Essential hypertension can continue Norvasc 3.  Peripheral vascular disease with recent AAA 4.  Tobacco abuse.  No need for nicotine patch since only smokes 4 to 5 cigarettes/day 5.  Hyperlipidemia.  Crestor held recently.  I will check a CPK  All the records, laboratory and radiological data are  reviewed and case discussed with ED provider. Management plans discussed with the patient, family and they are in agreement.  The patient will be admitted as inpatient status secondary to acute kidney injury which will likely take a few days to stabilize and improve.  CODE STATUS: Full code as per daughter  TOTAL TIME TAKING CARE OF THIS PATIENT: 50 minutes.    Loletha Grayer M.D on 09/20/2019 at 2:23 PM  Between 7am to 6pm - Pager - [phone]  After 6pm call admission pager [phone]  [facility]  CC: Primary care physician; Lorine Bears Lupita Raider, FNP

## 2019-09-20 NOTE — ED Notes (Signed)
Back from US.

## 2019-09-20 NOTE — Telephone Encounter (Signed)
The pt daughter called back requesting to speak with Elmyra Ricks. I informed her that she was not available at this time, but I could take a message for her. She informed me that her father is refusing to go to the hospital per [facility] request for possible acute renal failure. She wanted to know if he could go to [facility] instead, because she heard that it was a better hospital. She also ask if it was something that could wait until tomorrow or was it an emergency. I informed the patient daughter that he needed to be seen right away and where he decides to go is their choice. She ask if Elmyra Ricks could call [facility] and speak with the triage nurse to notify them he was coming. I informed her that this was not an option, but she did already contact [facility] triage nurse. She ask if [facility] would be able to see his labs. I told her that we are both on Epic, but it is a different EMR so I'm not sure what all they can retrieve. She said she called her cousin who is a physician and is waiting to hear back from her to discuss this with her. I stress to her the importance of getting her father seen ASAP. She verbalize understanding, no questions or concerns.

## 2019-09-20 NOTE — Telephone Encounter (Signed)
I received a call from the patient's PCP this morning regarding the patient's recent lab work.  The patient's family contacted [place] on 09/12/2019 stating that the patient had been lethargic as well as not eating and drinking with some significant nausea as well as vomiting at times.  The patient was instructed to have blood work drawn, including a CMP, CBC, BNP as well as an ESR.  However, the patient's daughter called [place] back to let us know that the patient was refusing to go have his blood drawn.  It was strongly suggested that he go at that time to have his blood drawn so that we could better triage his possible situation.  The patient came in for an office visit on 09/16/2019 and still had not had the lab work drawn.  Finally the patient saw his PCP on 09/19/2019 and lab work was drawn in office.  The patient had a sudden decrease in GFR from his most recent lab work.  On 08/28/2019 his GFR was 41 and on the most recent lab work it dropped to 11.  On 4 7 the patient's creatinine was 1.66 however it was 4.69 on 09/19/2019.  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  The patient's PCP contacted our office this morning to discuss this case.  It was advised that the patient should seek emergency attention as his sudden decrease in GFR would cause to indicate kidney injury and he may be in need of dialysis.  PCP agreed and this was communicated to the patient and family.

## 2019-09-20 NOTE — Progress Notes (Signed)
[facility] Daily Progress Note  Subjective: Endovascular AAA repair: 08/14/19  Last seen in our office on: 08/22/19  Patient with continued lethargy. Patient with 10-15 pound weight loss.   Hypertensive with concern for acute kidney injury - labs done at PCP yesterday (09/19/19). Patient agreed to come to the emergency department for further work-up.   Objective: Vitals:   09/20/19 1054 09/20/19 1055 09/20/19 1121  BP: (!) 184/77    Pulse: 73    Resp: 18    Temp: 98.3 F (36.8 C)    TempSrc: Oral    SpO2: 100%  99%  Weight:  53.5 kg   Height:  5\' 10"  (1.778 m)     Intake/Output Summary (Last 24 hours) at 09/20/2019 1454 Last data filed at 09/20/2019 1417 Gross per 24 hour  Intake 1000 ml  Output --  Net 1000 ml   Physical Exam: A&Ox3, NAD CV: RRR Pulmonary: CTA Bilaterally Abdomen: Soft, Nontender, Nondistended Vascular: Warm distally to toes   Laboratory: CBC    Component Value Date/Time   WBC 10.1 09/20/2019 1113   HGB 11.7 (L) 09/20/2019 1113   HCT 33.8 (L) 09/20/2019 1113   PLT 153 09/20/2019 1113   BMET    Component Value Date/Time   NA 139 09/20/2019 1113   K 3.5 09/20/2019 1113   CL 102 09/20/2019 1113   CO2 24 09/20/2019 1113   GLUCOSE 125 (H) 09/20/2019 1113   BUN 56 (H) 09/20/2019 1113   CREATININE 5.03 (H) 09/20/2019 1113   CREATININE 4.69 (H) 09/19/2019 1130   CALCIUM 8.9 09/20/2019 1113   GFRNONAA 11 (L) 09/20/2019 1113   GFRNONAA 11 (L) 09/19/2019 1130   GFRAA 12 (L) 09/20/2019 1113   GFRAA 13 (L) 09/19/2019 1130   Assessment/Planning: The patient is a 74 year old male status post endovascular AAA repair on 08/14/19 with ongoing weakness and lethargy s/p intervention.  Patient had labs drawn by PCPs office yesterday which is concerning for acute kidney injury.  Patient has been admitted to medicine service.  1) AKI: - Creatinine today 5.03 with a GFR of 11, plan for gently rehydration - Nephrology consulted - Renal  ultrasound  2) Hypertension 184/77 today at 10:00am - Admitted to hospital service for monitoring  Discussed with Dr. Eber Hong Iyan Flett PA-C 09/20/2019 2:54 PM

## 2019-09-21 ENCOUNTER — Encounter (INDEPENDENT_AMBULATORY_CARE_PROVIDER_SITE_OTHER): Payer: Self-pay | Admitting: Vascular Surgery

## 2019-09-21 LAB — PROTEIN / CREATININE RATIO, URINE
Creatinine, Urine: 80 mg/dL
Protein Creatinine Ratio: 3.93 mg/mg{Cre} — ABNORMAL HIGH (ref 0.00–0.15)
Total Protein, Urine: 314 mg/dL

## 2019-09-21 LAB — HEPATITIS B CORE ANTIBODY, IGM: Hep B C IgM: NONREACTIVE

## 2019-09-21 LAB — HEPATITIS C ANTIBODY: HCV Ab: NONREACTIVE

## 2019-09-21 LAB — BASIC METABOLIC PANEL
Anion gap: 9 (ref 5–15)
BUN: 59 mg/dL — ABNORMAL HIGH (ref 8–23)
CO2: 22 mmol/L (ref 22–32)
Calcium: 8 mg/dL — ABNORMAL LOW (ref 8.9–10.3)
Chloride: 107 mmol/L (ref 98–111)
Creatinine, Ser: 5.19 mg/dL — ABNORMAL HIGH (ref 0.61–1.24)
GFR calc Af Amer: 12 mL/min — ABNORMAL LOW (ref >60)
GFR calc non Af Amer: 10 mL/min — ABNORMAL LOW (ref >60)
Glucose, Bld: 128 mg/dL — ABNORMAL HIGH (ref 70–99)
Potassium: 3.3 mmol/L — ABNORMAL LOW (ref 3.5–5.1)
Sodium: 138 mmol/L (ref 135–145)

## 2019-09-21 LAB — CBC
HCT: 28.1 % — ABNORMAL LOW (ref 39.0–52.0)
Hemoglobin: 9.7 g/dL — ABNORMAL LOW (ref 13.0–17.0)
MCH: 31 pg (ref 26.0–34.0)
MCHC: 34.5 g/dL (ref 30.0–36.0)
MCV: 89.8 fL (ref 80.0–100.0)
Platelets: 142 10*3/uL — ABNORMAL LOW (ref 150–400)
RBC: 3.13 MIL/uL — ABNORMAL LOW (ref 4.22–5.81)
RDW: 16.8 % — ABNORMAL HIGH (ref 11.5–15.5)
WBC: 9.4 10*3/uL (ref 4.0–10.5)
nRBC: 0 % (ref 0.0–0.2)

## 2019-09-21 LAB — HEPATITIS B SURFACE ANTIGEN: Hepatitis B Surface Ag: NONREACTIVE

## 2019-09-21 LAB — HEPATITIS B SURFACE ANTIBODY,QUALITATIVE: Hep B S Ab: NONREACTIVE

## 2019-09-21 MED ORDER — LABETALOL HCL 100 MG PO TABS
100.0000 mg | ORAL_TABLET | Freq: Two times a day (BID) | ORAL | Status: DC
  Administered 2019-09-21 (×2): 100 mg via ORAL
  Filled 2019-09-21 (×3): qty 1

## 2019-09-21 MED ORDER — AMLODIPINE BESYLATE 10 MG PO TABS
10.0000 mg | ORAL_TABLET | Freq: Every day | ORAL | Status: DC
  Administered 2019-09-21 – 2019-09-30 (×10): 10 mg via ORAL
  Filled 2019-09-21 (×10): qty 1

## 2019-09-21 MED ORDER — HYDRALAZINE HCL 20 MG/ML IJ SOLN
10.0000 mg | Freq: Once | INTRAMUSCULAR | Status: DC
  Filled 2019-09-21: qty 0.5

## 2019-09-21 MED ORDER — LABETALOL HCL 100 MG PO TABS
100.0000 mg | ORAL_TABLET | Freq: Once | ORAL | Status: AC
  Administered 2019-09-21: 100 mg via ORAL
  Filled 2019-09-21: qty 1

## 2019-09-21 NOTE — Progress Notes (Signed)
BP 183/73, prn labetalol given. BP at 175/87. Discussed with on call provider Rufina Falco NP, one time dose hydralazine 10mg  ordered to give in an hour.

## 2019-09-21 NOTE — Plan of Care (Signed)
  Problem: Education: Goal: Knowledge of disease and its progression will improve 09/21/2019 0015 by Herbie Baltimore, RN Outcome: Progressing 09/21/2019 0015 by Herbie Baltimore, RN Outcome: Progressing 09/21/2019 0014 by Herbie Baltimore, RN Outcome: Progressing   Problem: Fluid Volume: Goal: Fluid volume balance will be maintained or improved 09/21/2019 0015 by Herbie Baltimore, RN Outcome: Progressing 09/21/2019 0015 by Herbie Baltimore, RN Outcome: Progressing 09/21/2019 0014 by Herbie Baltimore, RN Outcome: Progressing   Problem: Urinary Elimination: Goal: Progression of disease will be identified and treated 09/21/2019 0015 by Herbie Baltimore, RN Outcome: Progressing 09/21/2019 0015 by Herbie Baltimore, RN Outcome: Progressing 09/21/2019 0014 by Herbie Baltimore, RN Outcome: Progressing   Problem: Health Behavior/Discharge Planning: Goal: Ability to manage health-related needs will improve 09/21/2019 0015 by Herbie Baltimore, RN Outcome: Progressing 09/21/2019 0015 by Herbie Baltimore, RN Outcome: Progressing   Problem: Clinical Measurements: Goal: Ability to maintain clinical measurements within normal limits will improve 09/21/2019 0015 by Herbie Baltimore, RN Outcome: Progressing 09/21/2019 0015 by Herbie Baltimore, RN Outcome: Progressing Goal: Will remain free from infection 09/21/2019 0015 by Herbie Baltimore, RN Outcome: Progressing 09/21/2019 0015 by Herbie Baltimore, RN Outcome: Progressing Goal: Diagnostic test results will improve 09/21/2019 0015 by Herbie Baltimore, RN Outcome: Progressing 09/21/2019 0015 by Herbie Baltimore, RN Outcome: Progressing Goal: Respiratory complications will improve 09/21/2019 0015 by Herbie Baltimore, RN Outcome: Progressing 09/21/2019 0015 by Herbie Baltimore, RN Outcome: Progressing Goal: Cardiovascular complication will be avoided 09/21/2019 0015 by Herbie Baltimore,  RN Outcome: Progressing 09/21/2019 0015 by Herbie Baltimore, RN Outcome: Progressing   Problem: Activity: Goal: Risk for activity intolerance will decrease 09/21/2019 0015 by Herbie Baltimore, RN Outcome: Progressing 09/21/2019 0015 by Herbie Baltimore, RN Outcome: Progressing   Problem: Nutrition: Goal: Adequate nutrition will be maintained 09/21/2019 0015 by Herbie Baltimore, RN Outcome: Progressing 09/21/2019 0015 by Herbie Baltimore, RN Outcome: Progressing   Problem: Coping: Goal: Level of anxiety will decrease 09/21/2019 0015 by Herbie Baltimore, RN Outcome: Progressing 09/21/2019 0015 by Herbie Baltimore, RN Outcome: Progressing   Problem: Elimination: Goal: Will not experience complications related to bowel motility 09/21/2019 0015 by Herbie Baltimore, RN Outcome: Progressing 09/21/2019 0015 by Herbie Baltimore, RN Outcome: Progressing Goal: Will not experience complications related to urinary retention 09/21/2019 0015 by Herbie Baltimore, RN Outcome: Progressing 09/21/2019 0015 by Herbie Baltimore, RN Outcome: Progressing   Problem: Pain Managment: Goal: General experience of comfort will improve 09/21/2019 0015 by Herbie Baltimore, RN Outcome: Progressing 09/21/2019 0015 by Herbie Baltimore, RN Outcome: Progressing   Problem: Safety: Goal: Ability to remain free from injury will improve 09/21/2019 0015 by Herbie Baltimore, RN Outcome: Progressing 09/21/2019 0015 by Herbie Baltimore, RN Outcome: Progressing   Problem: Skin Integrity: Goal: Risk for impaired skin integrity will decrease 09/21/2019 0015 by Herbie Baltimore, RN Outcome: Progressing 09/21/2019 0015 by Herbie Baltimore, RN Outcome: Progressing

## 2019-09-21 NOTE — Progress Notes (Addendum)
    BRIEF OVERNIGHT PROGRESS REPORT   Patient's blood pressure remains elevated despite administration of PRN Labetalol. He is currently asymptomatic. Patient's  daughter [name] who is currently at the bedside is concerned about "unnecessary" use of medication to treat blood pressure especially if refractory to treatment. Daughter would like to hold BP medications and focus on treating the underlying cause. Per patient and daughter, their culture believe more in natural approach and less medication. Patient's daughter is requesting the treatment team to consider holding Heparin as well for DVT prophylaxis and instead use SCDs in addition to ambulation. She agrees with current tests as recommended and IVFs hydration. Patient's daughter would also like care to be clustered in order to allow patient  time to rest, request that labs be drawn between 6am-7am if possible.      Rufina Falco, DNP, CCRN, FNP-C [facility] Nurse Practitioner  [facility]

## 2019-09-21 NOTE — Progress Notes (Signed)
Notified Dr. Leslye Peer of BP 198/73, rest of VSS.  Per daughter labetalol was not very effective last night.  MD placed order.

## 2019-09-21 NOTE — Progress Notes (Signed)
[place], [place] 09/21/19  Subjective:   Hospital day # 1 Overall patient is doing better today.  He states he is able to eat and drink more.  He is able to void more Ambulatory in the room. Still has some nausea But able to deal with it.  He is trying to avoid medications    Objective:  Vital signs in last 24 hours:  Temp:  [98.3 F (36.8 C)-99.6 F (37.6 C)] 98.3 F (36.8 C) (05/01 1117) Pulse Rate:  [57-77] 61 (05/01 1117) Resp:  [16-18] 16 (05/01 1117) BP: (163-198)/(73-97) 177/75 (05/01 1117) SpO2:  [96 %-100 %] 100 % (05/01 1117)  Weight change:  Filed Weights   09/20/19 1055  Weight: 53.5 kg    Intake/Output:    Intake/Output Summary (Last 24 hours) at 09/21/2019 1323 Last data filed at 09/21/2019 0048 Gross per 24 hour  Intake 1000 ml  Output 200 ml  Net 800 ml    Gen:   Alert, cooperative, no distress, appears stated age Head:   Normocephalic, without obvious abnormality, atraumatic Eyes/ENT:  Conjunctiva clear,  moist oral mucus membranes Neck:  Supple,  thyroid: not enlarged, no JVD Lungs:   Clear to auscultation bilaterally, respirations unlabored Heart:   Regular rate and rhythm, S1, S2 normal, norub or gallop Abdomen:   Soft, non-tender,  Extremities: no cyanosis, trace to +1 edema Skin:  Skin color, texture, turgor normal, no rashes or lesions Neurologic: Alert and oriented, able to answer questions appropriately, ambulatory in hallway      Basic Metabolic Panel:  Recent Labs  Lab 09/19/19 1130 09/20/19 1113 09/21/19 0421  NA 139 139 138  K 3.6 3.5 3.3*  CL 103 102 107  CO2 25 24 22   GLUCOSE 120* 125* 128*  BUN 54* 56* 59*  CREATININE 4.69* 5.03* 5.19*  CALCIUM 9.2 8.9 8.0*     CBC: Recent Labs  Lab 09/19/19 1125 09/19/19 1130 09/19/19 1131 09/20/19 1113 09/21/19 0421  WBC CANCELED 9.8 CANCELED 10.1 9.4  NEUTROABS  --  6,958  --  7.0  --   HGB  --  12.1*  --  11.7* 9.7*  HCT  --  36.8*  --   33.8* 28.1*  MCV  --  93.6  --  89.2 89.8  PLT  --  152  --  153 142*      Lab Results  Component Value Date   HEPBSAG NON REACTIVE 09/20/2019   HEPBSAB NON REACTIVE 09/20/2019      Microbiology:  Recent Results (from the past 240 hour(s))  Respiratory Panel by RT PCR (Flu A&B, Covid) - Nasopharyngeal Swab     Status: None   Collection Time: 09/20/19 11:13 AM   Specimen: Nasopharyngeal Swab  Result Value Ref Range Status   SARS Coronavirus 2 by RT PCR NEGATIVE NEGATIVE Final    Comment: (NOTE) SARS-CoV-2 target nucleic acids are NOT DETECTED. The SARS-CoV-2 RNA is generally detectable in upper respiratoy specimens during the acute phase of infection. The lowest concentration of SARS-CoV-2 viral copies this assay can detect is 131 copies/mL. A negative result does not preclude SARS-Cov-2 infection and should not be used as the sole basis for treatment or other patient management decisions. A negative result may occur with  improper specimen collection/handling, submission of specimen other than nasopharyngeal swab, presence of viral mutation(s) within the areas targeted by this assay, and inadequate number of viral copies (<131 copies/mL). A negative result must be combined with clinical observations,  patient history, and epidemiological information. The expected result is Negative. Fact Sheet for Patients:  PinkCheek.be Fact Sheet for Healthcare Providers:  GravelBags.it This test is not yet ap proved or cleared by the [place] FDA and  has been authorized for detection and/or diagnosis of SARS-CoV-2 by FDA under an Emergency Use Authorization (EUA). This EUA will remain  in effect (meaning this test can be used) for the duration of the COVID-19 declaration under Section 564(b)(1) of the Act, 21 U.S.C. section 360bbb-3(b)(1), unless the authorization is terminated or revoked sooner.    Influenza A by PCR  NEGATIVE NEGATIVE Final   Influenza B by PCR NEGATIVE NEGATIVE Final    Comment: (NOTE) The Xpert Xpress SARS-CoV-2/FLU/RSV assay is intended as an aid in  the diagnosis of influenza from Nasopharyngeal swab specimens and  should not be used as a sole basis for treatment. Nasal washings and  aspirates are unacceptable for Xpert Xpress SARS-CoV-2/FLU/RSV  testing. Fact Sheet for Patients: PinkCheek.be Fact Sheet for Healthcare Providers: GravelBags.it This test is not yet approved or cleared by the [place] FDA and  has been authorized for detection and/or diagnosis of SARS-CoV-2 by  FDA under an Emergency Use Authorization (EUA). This EUA will remain  in effect (meaning this test can be used) for the duration of the  Covid-19 declaration under Section 564(b)(1) of the Act, 21  U.S.C. section 360bbb-3(b)(1), unless the authorization is  terminated or revoked. Performed at [facility], [address] [place], [place] [address]     Coagulation Studies: No results for input(s): LABPROT, INR in the last 72 hours.  Urinalysis: Recent Labs    09/19/19 1029 09/20/19 1418  COLORURINE  --  YELLOW*  LABSPEC  --  1.009  PHURINE  --  6.0  GLUCOSEU  --  NEGATIVE  HGBUR  --  NEGATIVE  BILIRUBINUR negative NEGATIVE  KETONESUR  --  NEGATIVE  PROTEINUR Positive* >=300*  UROBILINOGEN 0.2  --   NITRITE negative NEGATIVE  LEUKOCYTESUR Negative NEGATIVE      Imaging: US Renal  Result Date: 09/20/2019 CLINICAL DATA:  Renal failure. EXAM: RENAL / URINARY TRACT ULTRASOUND COMPLETE COMPARISON:  None. FINDINGS: Right Kidney: Renal measurements: 10.4 x 3.6 x 4.4 cm = volume: 87 mL. Mild cortical increased echogenicity. No mass or hydronephrosis visualized. Left Kidney: Renal measurements: 9.4 x 4.4 x 3.4 cm = volume: 73 mL. Mild increased cortical echogenicity. No mass or hydronephrosis visualized. Bladder: Appears normal  for degree of bladder distention. Other: Trace ascites. IMPRESSION: 1. Normal size kidneys without hydronephrosis. Mild bilateral increased cortical echogenicity which can be seen in medical renal disease. 2.  Trace ascites. Electronically Signed   By: Marin Olp M.D.   On: 09/20/2019 13:06   US AORTA DUPLEX LIMITED  Result Date: 09/20/2019 CLINICAL DATA:  74 year old male with a history of abdominal aortic aneurysm, status post EVAR EXAM: ULTRASOUND OF ABDOMINAL AORTA TECHNIQUE: Ultrasound examination of the abdominal aorta was performed to evaluate for abdominal aortic aneurysm. COMPARISON:  CT 08/28/2019 FINDINGS: Abdominal aortic measurements as follows: Proximal:  2.4 cm Mid:  4.4 cm Distal:  4.7 cm Right iliac artery: 1.6 cm Left iliac artery: 1.3 cm Patent EVAR IMPRESSION: Duplex demonstrates patent EVAR. Maximum diameter of the aneurysm sac measures less than 5 cm. Correlation with the baseline aneurysm size may be useful for comparison of any change. Signed, Dulcy Fanny. Dellia Nims, [place] [facility] Electronically Signed   By: Corrie Mckusick  D.O.   On: 09/20/2019 13:19     Medications:    . amLODipine  10 mg Oral Daily  . labetalol  100 mg Oral BID  . multivitamin with minerals  1 tablet Oral Daily   acetaminophen, ondansetron **OR** ondansetron (ZOFRAN) IV  Assessment/ Plan:  74 y.o. 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 Acute renal failure 2. Chronic kidney disease stage IIIA: baseline creatinine of 1.28, GFR of 56 on 08/12/19;  possibly new baseline 1.66 on August 28, 2019 3. Proteinuria 4. Eosinophilia 5. Anemia with renal failure 6.  AAA.  6 cm.  Status post  endovascular repair 3/25.  Complicated by embolization of both great toes.  Post procedure cr 1.66 on APr 7 ; Repeat IV contrast exposure 75 cc on April 7 for CT angiogram  Differential diagnosis includes IV contrast-induced nephropathy, cholesterol embolic disease, acute interstitial nephritis, prerenal leading to ATN Work-up so far includes renal ultrasound which shows normal-sized kidney and mild bilateral increased echogenicity Urinalysis-proteinuria was detected.  Urine protein to creatinine ratio pending.  0-5 RBCs Serologies are pending  Discussed with patient and his daughter that rate of rise of creatinine has slowed down. If serum creatinine does not improve as expected, we could consider kidney biopsy on Monday or Tuesday Also discussed possibility of needing dialysis    LOS: [place] 5/1/20211:23 PM  [place], [place]  Note: This note was prepared with Dragon dictation. Any transcription errors are unintentional

## 2019-09-21 NOTE — Progress Notes (Signed)
PT Cancellation Note  Patient Details Name: Scott Oconnor MRN: [id number] DOB: Aug 07, 1945   Cancelled Treatment:    Reason Eval/Treat Not Completed: Other (comment).  PT consult received.  Chart reviewed.  Pt's most recent BP (0806 this morning) noted to be elevated to 198/73.  Per discussion with pt's nurse, plan to treat elevated BP.  D/t elevated BP, will hold PT at this time and will  re-attempt PT evaluation at a later date/time as medically appropriate.  Leitha Bleak, PT 09/21/19, 8:50 AM

## 2019-09-21 NOTE — Progress Notes (Signed)
Patient's BP 195/85, HR 75. Received order from on call provider Rufina Falco NP for prn labetalol.

## 2019-09-21 NOTE — Progress Notes (Signed)
Patient ID: Lancelot Alyea, male   DOB: 06-07-1945, 74 y.o.   MRN: [id number] [facility] PROGRESS NOTE  Landynn Dupler CXK:[id number] DOB: 1946/05/21 DOA: 09/20/2019 PCP: Verl Bangs, FNP  HPI/Subjective: Patient is left-hand informed little swollen.  A little bit less after elevation.  No pain in the arm.  Patient eating a little bit better.  Patient ambulating in the hallway when I saw him earlier.  Objective: Vitals:   09/21/19 0806 09/21/19 1117  BP: (!) 198/73 (!) 177/75  Pulse: 66 61  Resp: 17 16  Temp: 99.3 F (37.4 C) 98.3 F (36.8 C)  SpO2: 96% 100%    Intake/Output Summary (Last 24 hours) at 09/21/2019 1321 Last data filed at 09/21/2019 0048 Gross per 24 hour  Intake 1000 ml  Output 200 ml  Net 800 ml   Filed Weights   09/20/19 1055  Weight: 53.5 kg    ROS: Review of Systems  Constitutional: Negative for fever.  Eyes: Negative for blurred vision.  Respiratory: Negative for shortness of breath.   Cardiovascular: Negative for chest pain.  Gastrointestinal: Negative for abdominal pain, nausea and vomiting.  Genitourinary: Negative for dysuria.  Musculoskeletal: Negative for joint pain.  Neurological: Negative for headaches.   Exam: Physical Exam  Constitutional: He is oriented to person, place, and time.  HENT:  Nose: No mucosal edema.  Mouth/Throat: No oropharyngeal exudate or posterior oropharyngeal edema.  Eyes: Pupils are equal, round, and reactive to light. Conjunctivae and lids are normal.  Neck: Carotid bruit is not present.  Cardiovascular: S1 normal and S2 normal. Exam reveals no gallop.  No murmur heard. Pulses:      Dorsalis pedis pulses are 2+ on the left side.  Respiratory: No respiratory distress. He has no wheezes. He has no rhonchi. He has no rales.  GI: Soft. Bowel sounds are normal. There is no abdominal tenderness.  Musculoskeletal:     Left wrist: Swelling present.     Right ankle: Swelling present.     Left ankle:  Swelling present.  Lymphadenopathy:    He has no cervical adenopathy.  Neurological: He is alert and oriented to person, place, and time. No cranial nerve deficit.  Skin: Skin is warm. No rash noted. Nails show no clubbing.  Psychiatric: He has a normal mood and affect.      Data Reviewed: Basic Metabolic Panel: Recent Labs  Lab 09/19/19 1130 09/20/19 1113 09/21/19 0421  NA 139 139 138  K 3.6 3.5 3.3*  CL 103 102 107  CO2 25 24 22   GLUCOSE 120* 125* 128*  BUN 54* 56* 59*  CREATININE 4.69* 5.03* 5.19*  CALCIUM 9.2 8.9 8.0*   Liver Function Tests: Recent Labs  Lab 09/19/19 1130 09/20/19 1113  AST 25 30  ALT 35 41  ALKPHOS  --  156*  BILITOT 0.7 1.1  PROT 6.8 7.2  ALBUMIN  --  3.7   CBC: Recent Labs  Lab 09/19/19 1125 09/19/19 1130 09/19/19 1131 09/20/19 1113 09/21/19 0421  WBC CANCELED 9.8 CANCELED 10.1 9.4  NEUTROABS  --  6,958  --  7.0  --   HGB  --  12.1*  --  11.7* 9.7*  HCT  --  36.8*  --  33.8* 28.1*  MCV  --  93.6  --  89.2 89.8  PLT  --  152  --  153 142*   Cardiac Enzymes: Recent Labs  Lab 09/20/19 1136  CKTOTAL 59     Recent Results (from the past  240 hour(s))  Respiratory Panel by RT PCR (Flu A&B, Covid) - Nasopharyngeal Swab     Status: None   Collection Time: 09-22-19 11:13 AM   Specimen: Nasopharyngeal Swab  Result Value Ref Range Status   SARS Coronavirus 2 by RT PCR NEGATIVE NEGATIVE Final    Comment: (NOTE) SARS-CoV-2 target nucleic acids are NOT DETECTED. The SARS-CoV-2 RNA is generally detectable in upper respiratoy specimens during the acute phase of infection. The lowest concentration of SARS-CoV-2 viral copies this assay can detect is 131 copies/mL. A negative result does not preclude SARS-Cov-2 infection and should not be used as the sole basis for treatment or other patient management decisions. A negative result may occur with  improper specimen collection/handling, submission of specimen other than nasopharyngeal  swab, presence of viral mutation(s) within the areas targeted by this assay, and inadequate number of viral copies (<131 copies/mL). A negative result must be combined with clinical observations, patient history, and epidemiological information. The expected result is Negative. Fact Sheet for Patients:  PinkCheek.be Fact Sheet for Healthcare Providers:  GravelBags.it This test is not yet ap proved or cleared by the [place] FDA and  has been authorized for detection and/or diagnosis of SARS-CoV-2 by FDA under an Emergency Use Authorization (EUA). This EUA will remain  in effect (meaning this test can be used) for the duration of the COVID-19 declaration under Section 564(b)(1) of the Act, 21 U.S.C. section 360bbb-3(b)(1), unless the authorization is terminated or revoked sooner.    Influenza A by PCR NEGATIVE NEGATIVE Final   Influenza B by PCR NEGATIVE NEGATIVE Final    Comment: (NOTE) The Xpert Xpress SARS-CoV-2/FLU/RSV assay is intended as an aid in  the diagnosis of influenza from Nasopharyngeal swab specimens and  should not be used as a sole basis for treatment. Nasal washings and  aspirates are unacceptable for Xpert Xpress SARS-CoV-2/FLU/RSV  testing. Fact Sheet for Patients: PinkCheek.be Fact Sheet for Healthcare Providers: GravelBags.it This test is not yet approved or cleared by the [place] FDA and  has been authorized for detection and/or diagnosis of SARS-CoV-2 by  FDA under an Emergency Use Authorization (EUA). This EUA will remain  in effect (meaning this test can be used) for the duration of the  Covid-19 declaration under Section 564(b)(1) of the Act, 21  U.S.C. section 360bbb-3(b)(1), unless the authorization is  terminated or revoked. Performed at [facility], [address] [place], [place] [address]      Studies: US  Renal  Result Date: 09-22-19 CLINICAL DATA:  Renal failure. EXAM: RENAL / URINARY TRACT ULTRASOUND COMPLETE COMPARISON:  None. FINDINGS: Right Kidney: Renal measurements: 10.4 x 3.6 x 4.4 cm = volume: 87 mL. Mild cortical increased echogenicity. No mass or hydronephrosis visualized. Left Kidney: Renal measurements: 9.4 x 4.4 x 3.4 cm = volume: 73 mL. Mild increased cortical echogenicity. No mass or hydronephrosis visualized. Bladder: Appears normal for degree of bladder distention. Other: Trace ascites. IMPRESSION: 1. Normal size kidneys without hydronephrosis. Mild bilateral increased cortical echogenicity which can be seen in medical renal disease. 2.  Trace ascites. Electronically Signed   By: Marin Olp M.D.   On: 2019-09-22 13:06   US AORTA DUPLEX LIMITED  Result Date: 2019-09-22 CLINICAL DATA:  74 year old male with a history of abdominal aortic aneurysm, status post EVAR EXAM: ULTRASOUND OF ABDOMINAL AORTA TECHNIQUE: Ultrasound examination of the abdominal aorta was performed to evaluate for abdominal aortic aneurysm. COMPARISON:  CT 08/28/2019 FINDINGS: Abdominal aortic measurements as follows: Proximal:  2.4 cm Mid:  4.4 cm Distal:  4.7 cm Right iliac artery: 1.6 cm Left iliac artery: 1.3 cm Patent EVAR IMPRESSION: Duplex demonstrates patent EVAR. Maximum diameter of the aneurysm sac measures less than 5 cm. Correlation with the baseline aneurysm size may be useful for comparison of any change. Signed, Dulcy Fanny. Dellia Nims, RPVI [facility] Electronically Signed   By: Corrie Mckusick D.O.   On: 09/20/2019 13:19    Scheduled Meds: . amLODipine  10 mg Oral Daily  . labetalol  100 mg Oral BID  . multivitamin with minerals  1 tablet Oral Daily   Assessment/Plan:  1. Acute kidney injury on chronic kidney disease stage IIIa.  Creatinine a little worse today at 5.19.  GFR still low.  Patient urinating.  Appetite better today.  The patient  did have a contrasted study on 08/28/2019.  Continue to watch renal function closely. 2. Accelerated hypertension.  I increase Norvasc this morning and added labetalol.  Continue to monitor blood pressure and add medications as needed. 3. Peripheral vascular disease with recent AAA repair 4. Tobacco abuse only smokes 4 to 5 cigarettes a day.  No need for nicotine patch 5. Hyperlipidemia.  Crestor held recently.  CPK normal range. 6. Left arm swelling.  Can elevate on pillow while lying in bed 7. Drop in hemoglobin likely from IV fluid hydration and dehydration initially.  Continue to watch hemoglobin.  Guaiac stools. 8. Thrombocytopenia.  Looking back at old labs this seems like it has been chronically a little bit low.  Will check hepatitis C.  Code Status:     Code Status Orders  (From admission, onward)         Start     Ordered   09/20/19 1416  Full code  Continuous     09/20/19 1416        Code Status History    Date Active Date Inactive Code Status Order ID Comments User Context   08/14/2019 1132 08/15/2019 2113 Full Code [id number]  Schnier, Dolores Lory, MD Inpatient   Advance Care Planning Activity     Family Communication: Spoke with the patient's daughter at the bedside Disposition Plan: Need to see creatinine peak and then start to improve.  This may take some time for improvement since he has not been feeling well since after the CAT scan.  The patient will be discharged home once creatinine starts improving and nephrology feels comfortable following patient as outpatient.  Consultants:  Nephrology  Vascular surgery  Time spent: 27 minutes  [place]

## 2019-09-22 LAB — VITAMIN B12: Vitamin B-12: 626 pg/mL (ref 180–914)

## 2019-09-22 LAB — BASIC METABOLIC PANEL
Anion gap: 8 (ref 5–15)
BUN: 57 mg/dL — ABNORMAL HIGH (ref 8–23)
CO2: 24 mmol/L (ref 22–32)
Calcium: 8.6 mg/dL — ABNORMAL LOW (ref 8.9–10.3)
Chloride: 107 mmol/L (ref 98–111)
Creatinine, Ser: 5.36 mg/dL — ABNORMAL HIGH (ref 0.61–1.24)
GFR calc Af Amer: 11 mL/min — ABNORMAL LOW (ref >60)
GFR calc non Af Amer: 10 mL/min — ABNORMAL LOW (ref >60)
Glucose, Bld: 141 mg/dL — ABNORMAL HIGH (ref 70–99)
Potassium: 3.4 mmol/L — ABNORMAL LOW (ref 3.5–5.1)
Sodium: 139 mmol/L (ref 135–145)

## 2019-09-22 LAB — HEPATITIS C ANTIBODY: HCV Ab: NONREACTIVE

## 2019-09-22 LAB — HEMOGLOBIN: Hemoglobin: 10.3 g/dL — ABNORMAL LOW (ref 13.0–17.0)

## 2019-09-22 LAB — COMPLETE METABOLIC PANEL WITH GFR

## 2019-09-22 LAB — CBC WITH DIFFERENTIAL/PLATELET

## 2019-09-22 LAB — THYROID PANEL WITH TSH

## 2019-09-22 LAB — FERRITIN: Ferritin: 296 ng/mL (ref 24–336)

## 2019-09-22 LAB — URINALYSIS, MICROSCOPIC ONLY

## 2019-09-22 MED ORDER — CLONIDINE HCL 0.1 MG PO TABS
0.1000 mg | ORAL_TABLET | Freq: Three times a day (TID) | ORAL | Status: DC
  Administered 2019-09-22 – 2019-09-24 (×7): 0.1 mg via ORAL
  Filled 2019-09-22 (×7): qty 1

## 2019-09-22 MED ORDER — ENSURE ENLIVE PO LIQD
237.0000 mL | Freq: Two times a day (BID) | ORAL | Status: DC
  Administered 2019-09-22 – 2019-09-26 (×3): 237 mL via ORAL

## 2019-09-22 MED ORDER — BOOST / RESOURCE BREEZE PO LIQD CUSTOM
1.0000 | ORAL | Status: DC
  Administered 2019-09-23 – 2019-09-25 (×3): 1 via ORAL

## 2019-09-22 MED ORDER — LABETALOL HCL 200 MG PO TABS
200.0000 mg | ORAL_TABLET | Freq: Two times a day (BID) | ORAL | Status: DC
  Administered 2019-09-22 (×2): 200 mg via ORAL
  Filled 2019-09-22 (×3): qty 1

## 2019-09-22 NOTE — Progress Notes (Signed)
Initial Nutrition Assessment  RD working remotely.   DOCUMENTATION CODES:   Underweight  INTERVENTION:  - will order Boost Breeze once/day, each supplement provides 250 kcal and 9 grams of protein. - will order Ensure Enlive BID, each supplement provides 350 kcal and 20 grams of protein. - will perform NFPE at follow-up.    NUTRITION DIAGNOSIS:   Increased nutrient needs related to acute illness as evidenced by estimated needs.  GOAL:   Patient will meet greater than or equal to 90% of their needs  MONITOR:   PO intake, Supplement acceptance, Labs, Weight trends  REASON FOR ASSESSMENT:   Malnutrition Screening Tool  ASSESSMENT:   74 y.o. male with medical history of hyperlipidemia, HTN, PVD, and Reynolds syndrome. He presented to the ED with swelling in feet and hands, fatigue, and nausea. He had AAA surgery on 08/14/19. He was admitted for AKI.  He ate 100% of dinner last night, per flow sheet documentation. Patient reports decreased appetite and intakes since his surgery a month ago. He denies any abdominal pain/pressure with PO intakes. He has been experiencing nausea with no episodes of vomiting for the past 3-4 days. He has had no chewing or swallowing difficulties but has been fatigued and gets tired from chewing quickly.   Patient reported that over the past 1 month he has lost 15 lb. Per chart review, weight on 4/30 in the ED was recorded as 118 lb. Weight on 4/7 was 119 lb. Weight on 3/1 was 120 lb. This indicates that patient has lost 2 lb (1.7% body weight) in the past 2 months; not significant for time frame.   Per notes: - AKI on stage 3 CKD   Labs reviewed; K: 3.4 mmol/l, BUN: 57 mg/dl, creatinine: 5.36 mg/dl, Ca: 8.6 mg/dl. Medications reviewed; multivitamin with minerals.   NUTRITION - FOCUSED PHYSICAL EXAM:  unable to complete at this time.   Diet Order:   Diet Order            Diet Heart Room service appropriate? Yes; Fluid consistency: Thin  Diet  effective now              EDUCATION NEEDS:   No education needs have been identified at this time  Skin:  Skin Assessment: Reviewed RN Assessment  Last BM:  4/30  Height:   Ht Readings from Last 1 Encounters:  09/20/19 5\' 10"  (1.778 m)    Weight:   Wt Readings from Last 1 Encounters:  09/20/19 53.5 kg    Estimated Nutritional Needs:  Kcal:  1700-1900 kcal Protein:  80-90 grams Fluid:  >/= 2.2 L/day     Jarome Matin, MS, RD, LDN, CNSC Inpatient Clinical Dietitian RD pager # available in [facility]  After hours/weekend pager # available in [facility]

## 2019-09-22 NOTE — Progress Notes (Signed)
Patient ID: Scott Oconnor, male   DOB: May 30, 1945, 74 y.o.   MRN: [id number] [facility] PROGRESS NOTE  Scott Oconnor EHM:[id number] DOB: April 22, 1946 DOA: 09/20/2019 PCP: Verl Bangs, FNP  HPI/Subjective: Patient has a bad taste in his mouth and does not want to eat.  Does feel little nauseous.  He states he is eating and drinking.  He states that he is urinating well.  Objective: Vitals:   09/22/19 0951 09/22/19 1209  BP: (!) 192/78 (!) 154/69  Pulse:  (!) 59  Resp:  16  Temp:  97.9 F (36.6 C)  SpO2:  98%    Intake/Output Summary (Last 24 hours) at 09/22/2019 1327 Last data filed at 09/21/2019 1900 Gross per 24 hour  Intake 240 ml  Output --  Net 240 ml   Filed Weights   09/20/19 1055  Weight: 53.5 kg    ROS: Review of Systems  Constitutional: Negative for fever.  Eyes: Negative for blurred vision.  Respiratory: Negative for shortness of breath.   Cardiovascular: Negative for chest pain.  Gastrointestinal: Positive for nausea. Negative for abdominal pain and vomiting.  Genitourinary: Negative for dysuria.  Musculoskeletal: Negative for joint pain.  Neurological: Negative for headaches.   Exam: Physical Exam  Constitutional: He is oriented to person, place, and time.  HENT:  Nose: No mucosal edema.  Mouth/Throat: No oropharyngeal exudate or posterior oropharyngeal edema.  Eyes: Pupils are equal, round, and reactive to light. Conjunctivae and lids are normal.  Neck: Carotid bruit is not present.  Cardiovascular: S1 normal and S2 normal. Exam reveals no gallop.  No murmur heard. Pulses:      Dorsalis pedis pulses are 2+ on the left side.  Respiratory: No respiratory distress. He has no wheezes. He has no rhonchi. He has no rales.  GI: Soft. Bowel sounds are normal. There is no abdominal tenderness.  Musculoskeletal:     Right ankle: Swelling present.     Left ankle: Swelling present.  Lymphadenopathy:    He has no cervical adenopathy.   Neurological: He is alert and oriented to person, place, and time. No cranial nerve deficit.  Skin: Skin is warm. No rash noted. Nails show no clubbing.  Psychiatric: He has a normal mood and affect.      Data Reviewed: Basic Metabolic Panel: Recent Labs  Lab 09/19/19 1130 09/20/19 1113 09/21/19 0421 09/22/19 0422  NA 139 139 138 139  K 3.6 3.5 3.3* 3.4*  CL 103 102 107 107  CO2 25 24 22 24   GLUCOSE 120* 125* 128* 141*  BUN 54* 56* 59* 57*  CREATININE 4.69* 5.03* 5.19* 5.36*  CALCIUM 9.2 8.9 8.0* 8.6*   Liver Function Tests: Recent Labs  Lab 09/19/19 1130 09/20/19 1113  AST 25 30  ALT 35 41  ALKPHOS  --  156*  BILITOT 0.7 1.1  PROT 6.8 7.2  ALBUMIN  --  3.7   CBC: Recent Labs  Lab 09/19/19 1125 09/19/19 1130 09/19/19 1131 09/20/19 1113 09/21/19 0421 09/22/19 0422  WBC CANCELED 9.8 CANCELED 10.1 9.4  --   NEUTROABS  --  6,958  --  7.0  --   --   HGB  --  12.1*  --  11.7* 9.7* 10.3*  HCT  --  36.8*  --  33.8* 28.1*  --   MCV  --  93.6  --  89.2 89.8  --   PLT  --  152  --  153 142*  --    Cardiac Enzymes: Recent  Labs  Lab 09/20/19 1136  CKTOTAL 59     Recent Results (from the past 240 hour(s))  Respiratory Panel by RT PCR (Flu A&B, Covid) - Nasopharyngeal Swab     Status: None   Collection Time: 09/20/19 11:13 AM   Specimen: Nasopharyngeal Swab  Result Value Ref Range Status   SARS Coronavirus 2 by RT PCR NEGATIVE NEGATIVE Final    Comment: (NOTE) SARS-CoV-2 target nucleic acids are NOT DETECTED. The SARS-CoV-2 RNA is generally detectable in upper respiratoy specimens during the acute phase of infection. The lowest concentration of SARS-CoV-2 viral copies this assay can detect is 131 copies/mL. A negative result does not preclude SARS-Cov-2 infection and should not be used as the sole basis for treatment or other patient management decisions. A negative result may occur with  improper specimen collection/handling, submission of specimen  other than nasopharyngeal swab, presence of viral mutation(s) within the areas targeted by this assay, and inadequate number of viral copies (<131 copies/mL). A negative result must be combined with clinical observations, patient history, and epidemiological information. The expected result is Negative. Fact Sheet for Patients:  PinkCheek.be Fact Sheet for Healthcare Providers:  GravelBags.it This test is not yet ap proved or cleared by the [place] FDA and  has been authorized for detection and/or diagnosis of SARS-CoV-2 by FDA under an Emergency Use Authorization (EUA). This EUA will remain  in effect (meaning this test can be used) for the duration of the COVID-19 declaration under Section 564(b)(1) of the Act, 21 U.S.C. section 360bbb-3(b)(1), unless the authorization is terminated or revoked sooner.    Influenza A by PCR NEGATIVE NEGATIVE Final   Influenza B by PCR NEGATIVE NEGATIVE Final    Comment: (NOTE) The Xpert Xpress SARS-CoV-2/FLU/RSV assay is intended as an aid in  the diagnosis of influenza from Nasopharyngeal swab specimens and  should not be used as a sole basis for treatment. Nasal washings and  aspirates are unacceptable for Xpert Xpress SARS-CoV-2/FLU/RSV  testing. Fact Sheet for Patients: PinkCheek.be Fact Sheet for Healthcare Providers: GravelBags.it This test is not yet approved or cleared by the [place] FDA and  has been authorized for detection and/or diagnosis of SARS-CoV-2 by  FDA under an Emergency Use Authorization (EUA). This EUA will remain  in effect (meaning this test can be used) for the duration of the  Covid-19 declaration under Section 564(b)(1) of the Act, 21  U.S.C. section 360bbb-3(b)(1), unless the authorization is  terminated or revoked. Performed at [facility], [address] [place], [place]  [address]       Scheduled Meds: . amLODipine  10 mg Oral Daily  . cloNIDine  0.1 mg Oral TID  . [START ON 09/23/2019] feeding supplement  1 Container Oral Q24H  . feeding supplement (ENSURE ENLIVE)  237 mL Oral BID BM  . labetalol  200 mg Oral BID  . multivitamin with minerals  1 tablet Oral Daily   Assessment/Plan:  1. Acute kidney injury on chronic kidney disease stage IIIa.  Creatinine a little worse today at 5.36.  GFR still low.  Patient urinating.  Appetite better today.  The patient did have a contrasted study on 08/28/2019.  Case discussed with nephrology.  They are waiting serologies at this time. Patient does have quite a bit of proteinuria.  Depending on serologies may consider a kidney biopsy.  Patient may end up needing dialysis if creatinine does not improve. 2. Accelerated hypertension.  Continue Norvasc 10 mg daily.  I increase  labetalol to 200 mg twice daily.  Nephrology added clonidine 3 times daily.  Continue to monitor blood pressure. 3. Peripheral vascular disease with recent AAA repair. 4. Tobacco abuse only smokes 4 to 5 cigarettes a day.  No need for nicotine patch 5. Hyperlipidemia.  Crestor held recently.  CPK normal range. 6. Drop in hemoglobin likely from IV fluid hydration and dehydration initially.  7. Thrombocytopenia.  Looking back at old labs this seems like it has been chronically a little bit low.  Hepatitis B and C profile negative.  Code Status:     Code Status Orders  (From admission, onward)         Start     Ordered   09/20/19 1416  Full code  Continuous     09/20/19 1416        Code Status History    Date Active Date Inactive Code Status Order ID Comments User Context   08/14/2019 1132 08/15/2019 2113 Full Code [id number]  Schnier, Dolores Lory, MD Inpatient   Advance Care Planning Activity     Family Communication: Spoke with the patient's daughter on the phone, wife at bedside Disposition Plan: Patient's creatinine not improving despite IV  fluids.  Awaiting to see tomorrow's creatinine and follow-up on serologies.  Nephrology to consider a kidney biopsy.  Patient may end up needing dialysis (if dialysis started would need 3 inpatient dialysis days prior to disposition).  At this point I do not have a disposition date yet.  The goal will be to go home.  Consultants:  Nephrology  Vascular surgery  Time spent: 28 minutes, case discussed with nephrology  [place]

## 2019-09-22 NOTE — Progress Notes (Signed)
Patient's IV occluded, restarted 22 gauge IV in right hand. Patient/ daughter requested he not have an IV until, "tomorrow". Explained the necessity of the IV, patient/ daughter verbalizes understanding.

## 2019-09-22 NOTE — Progress Notes (Signed)
PT Cancellation Note  Patient Details Name: Scott Oconnor MRN: [id number] DOB: [birth year]   Cancelled Treatment:    Reason Eval/Treat Not Completed: Other (comment).  Chart reviewed.  Pt's BP noted to be recently elevated again to 192/78 at 0951 this morning.  Of note, MD note from yesterday stating "Patient ambulating in the hallway when I saw him earlier" and another MD note from yesterday stating "Ambulatory in the room".  D/t elevated BP, will hold PT at this time and will  re-attempt PT evaluation at a later date/time as medically appropriate.   Leitha Bleak, PT 09/22/19, 10:41 AM

## 2019-09-22 NOTE — Progress Notes (Signed)
[place], [place] 09/22/19  Subjective:   Hospital day # 2 Overall patient states that he is doing better today compared to yesterday.  He states he is able to eat and drink more.  He is able to void more Still has some nausea But able to deal with it.  He is trying to avoid medications Patient's wife is in the room with him    Objective:  Vital signs in last 24 hours:  Temp:  [98.5 F (36.9 C)-99.3 F (37.4 C)] 98.5 F (36.9 C) (05/02 0743) Pulse Rate:  [59-76] 71 (05/02 0949) Resp:  [16-19] 16 (05/02 0743) BP: (167-193)/(73-87) 192/78 (05/02 0951) SpO2:  [96 %-99 %] 96 % (05/02 0743)  Weight change:  Filed Weights   09/20/19 1055  Weight: 53.5 kg    Intake/Output:    Intake/Output Summary (Last 24 hours) at 09/22/2019 1129 Last data filed at 09/21/2019 1900 Gross per 24 hour  Intake 240 ml  Output --  Net 240 ml    Gen:   Alert, cooperative, no distress, appears stated age Head:   Normocephalic, without obvious abnormality, atraumatic Eyes/ENT:  Conjunctiva clear,  moist oral mucus membranes Neck:  Supple,  thyroid: not enlarged, no JVD Lungs:   Clear to auscultation bilaterally, respirations unlabored Heart:   Regular rate and rhythm,  Abdomen:   Soft, non-tender,  Extremities: no cyanosis, +1 edema bilaterally Skin:  Skin color, texture, turgor normal, no rashes or lesions Neurologic: Alert and oriented, able to answer questions appropriately,      Basic Metabolic Panel:  Recent Labs  Lab 09/19/19 1130 09/19/19 1130 09/20/19 1113 09/21/19 0421 09/22/19 0422  NA 139  --  139 138 139  K 3.6  --  3.5 3.3* 3.4*  CL 103  --  102 107 107  CO2 25  --  24 22 24   GLUCOSE 120*  --  125* 128* 141*  BUN 54*  --  56* 59* 57*  CREATININE 4.69*  --  5.03* 5.19* 5.36*  CALCIUM 9.2   < > 8.9 8.0* 8.6*   < > = values in this interval not displayed.     CBC: Recent Labs  Lab 09/19/19 1125 09/19/19 1130 09/19/19 1131  09/20/19 1113 09/21/19 0421 09/22/19 0422  WBC CANCELED 9.8 CANCELED 10.1 9.4  --   NEUTROABS  --  6,958  --  7.0  --   --   HGB  --  12.1*  --  11.7* 9.7* 10.3*  HCT  --  36.8*  --  33.8* 28.1*  --   MCV  --  93.6  --  89.2 89.8  --   PLT  --  152  --  153 142*  --       Lab Results  Component Value Date   HEPBSAG NON REACTIVE 09/20/2019   HEPBSAB NON REACTIVE 09/20/2019   HEPBIGM NON REACTIVE 09/20/2019      Microbiology:  Recent Results (from the past 240 hour(s))  Respiratory Panel by RT PCR (Flu A&B, Covid) - Nasopharyngeal Swab     Status: None   Collection Time: 09/20/19 11:13 AM   Specimen: Nasopharyngeal Swab  Result Value Ref Range Status   SARS Coronavirus 2 by RT PCR NEGATIVE NEGATIVE Final    Comment: (NOTE) SARS-CoV-2 target nucleic acids are NOT DETECTED. The SARS-CoV-2 RNA is generally detectable in upper respiratoy specimens during the acute phase of infection. The lowest concentration of SARS-CoV-2 viral copies this assay can detect  is 131 copies/mL. A negative result does not preclude SARS-Cov-2 infection and should not be used as the sole basis for treatment or other patient management decisions. A negative result may occur with  improper specimen collection/handling, submission of specimen other than nasopharyngeal swab, presence of viral mutation(s) within the areas targeted by this assay, and inadequate number of viral copies (<131 copies/mL). A negative result must be combined with clinical observations, patient history, and epidemiological information. The expected result is Negative. Fact Sheet for Patients:  PinkCheek.be Fact Sheet for Healthcare Providers:  GravelBags.it This test is not yet ap proved or cleared by the [place] FDA and  has been authorized for detection and/or diagnosis of SARS-CoV-2 by FDA under an Emergency Use Authorization (EUA). This EUA will remain  in  effect (meaning this test can be used) for the duration of the COVID-19 declaration under Section 564(b)(1) of the Act, 21 U.S.C. section 360bbb-3(b)(1), unless the authorization is terminated or revoked sooner.    Influenza A by PCR NEGATIVE NEGATIVE Final   Influenza B by PCR NEGATIVE NEGATIVE Final    Comment: (NOTE) The Xpert Xpress SARS-CoV-2/FLU/RSV assay is intended as an aid in  the diagnosis of influenza from Nasopharyngeal swab specimens and  should not be used as a sole basis for treatment. Nasal washings and  aspirates are unacceptable for Xpert Xpress SARS-CoV-2/FLU/RSV  testing. Fact Sheet for Patients: PinkCheek.be Fact Sheet for Healthcare Providers: GravelBags.it This test is not yet approved or cleared by the [place] FDA and  has been authorized for detection and/or diagnosis of SARS-CoV-2 by  FDA under an Emergency Use Authorization (EUA). This EUA will remain  in effect (meaning this test can be used) for the duration of the  Covid-19 declaration under Section 564(b)(1) of the Act, 21  U.S.C. section 360bbb-3(b)(1), unless the authorization is  terminated or revoked. Performed at [facility], [address] [place], [place] [address]     Coagulation Studies: No results for input(s): LABPROT, INR in the last 72 hours.  Urinalysis: Recent Labs    09/20/19 1418  COLORURINE YELLOW*  LABSPEC 1.009  PHURINE 6.0  GLUCOSEU NEGATIVE  HGBUR NEGATIVE  BILIRUBINUR NEGATIVE  KETONESUR NEGATIVE  PROTEINUR >=300*  NITRITE NEGATIVE  LEUKOCYTESUR NEGATIVE      Imaging: US Renal  Result Date: 09/20/2019 CLINICAL DATA:  Renal failure. EXAM: RENAL / URINARY TRACT ULTRASOUND COMPLETE COMPARISON:  None. FINDINGS: Right Kidney: Renal measurements: 10.4 x 3.6 x 4.4 cm = volume: 87 mL. Mild cortical increased echogenicity. No mass or hydronephrosis visualized. Left Kidney: Renal measurements: 9.4  x 4.4 x 3.4 cm = volume: 73 mL. Mild increased cortical echogenicity. No mass or hydronephrosis visualized. Bladder: Appears normal for degree of bladder distention. Other: Trace ascites. IMPRESSION: 1. Normal size kidneys without hydronephrosis. Mild bilateral increased cortical echogenicity which can be seen in medical renal disease. 2.  Trace ascites. Electronically Signed   By: Marin Olp M.D.   On: 09/20/2019 13:06   US AORTA DUPLEX LIMITED  Result Date: 09/20/2019 CLINICAL DATA:  74 year old male with a history of abdominal aortic aneurysm, status post EVAR EXAM: ULTRASOUND OF ABDOMINAL AORTA TECHNIQUE: Ultrasound examination of the abdominal aorta was performed to evaluate for abdominal aortic aneurysm. COMPARISON:  CT 08/28/2019 FINDINGS: Abdominal aortic measurements as follows: Proximal:  2.4 cm Mid:  4.4 cm Distal:  4.7 cm Right iliac artery: 1.6 cm Left iliac artery: 1.3 cm Patent EVAR IMPRESSION: Duplex demonstrates patent EVAR. Maximum diameter of the  aneurysm sac measures less than 5 cm. Correlation with the baseline aneurysm size may be useful for comparison of any change. Signed, Dulcy Fanny. Dellia Nims, RPVI [facility]iology Electronically Signed   By: Corrie Mckusick D.O.   On: 09/20/2019 13:19     Medications:    . amLODipine  10 mg Oral Daily  . cloNIDine  0.1 mg Oral TID  . [START ON 09/23/2019] feeding supplement  1 Container Oral Q24H  . feeding supplement (ENSURE ENLIVE)  237 mL Oral BID BM  . labetalol  200 mg Oral BID  . multivitamin with minerals  1 tablet Oral Daily   acetaminophen, ondansetron **OR** ondansetron (ZOFRAN) IV  Assessment/ Plan:  74 y.o. 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 Acute renal failure 2. Chronic kidney disease stage IIIA: baseline creatinine of 1.28, GFR of 56 on 08/12/19;  possibly new baseline 1.66 on August 28, 2019 3. Proteinuria 4. Eosinophilia 5. Anemia with renal failure 6.  AAA.  6 cm.  Status post endovascular repair 3/25.  Complicated by embolization of both great toes.  Post procedure cr 1.66 on Apr 7 ; Repeat IV contrast exposure 75 cc on April 7 for CT angiogram 7.  Hypokalemia 8.  Hypertension  Differential diagnosis includes IV contrast-induced nephropathy, cholesterol embolic disease, acute interstitial nephritis, prerenal leading to ATN Work-up so far includes renal ultrasound which shows normal-sized kidney and mild bilateral increased echogenicity Urinalysis-proteinuria was detected.  Urine protein to creatinine ratio 3.9 gm.  0-5 RBCs Serologies are pending  Discussed with patient and his wife that serum creatinine is worse again today. If serum creatinine does not improve as expected, we could consider kidney biopsy on Monday or Tuesday Also discussed possibility of needing dialysis Potassium level expected to improve with Normal diet Avoid aspirin or other antiplatelet agents until decision for kidney biopsy is finalized Isolated hypertension noted.  We will add clonidine to the regimen. Will discuss with vascular surgery may be utility of obtaining a renal artery duplex in the situation    LOS: 2 Justus Droke 5/2/202111:29 AM  [place], [place]  Note: This note was prepared with Dragon dictation. Any transcription errors are unintentional

## 2019-09-22 NOTE — Plan of Care (Signed)
  Problem: Education: Goal: Knowledge of disease and its progression will improve Outcome: Progressing   Problem: Fluid Volume: Goal: Fluid volume balance will be maintained or improved Outcome: Progressing   Problem: Urinary Elimination: Goal: Progression of disease will be identified and treated Outcome: Progressing   Problem: Education: Goal: Knowledge of General Education information will improve Description: Including pain rating scale, medication(s)/side effects and non-pharmacologic comfort measures Outcome: Progressing   Problem: Education: Goal: Knowledge of General Education information will improve Description: Including pain rating scale, medication(s)/side effects and non-pharmacologic comfort measures Outcome: Progressing   Problem: Health Behavior/Discharge Planning: Goal: Ability to manage health-related needs will improve Outcome: Progressing   Problem: Clinical Measurements: Goal: Ability to maintain clinical measurements within normal limits will improve Outcome: Progressing Goal: Will remain free from infection Outcome: Progressing Goal: Diagnostic test results will improve Outcome: Progressing Goal: Respiratory complications will improve Outcome: Progressing Goal: Cardiovascular complication will be avoided Outcome: Progressing   Problem: Activity: Goal: Risk for activity intolerance will decrease Outcome: Progressing   Problem: Nutrition: Goal: Adequate nutrition will be maintained Outcome: Progressing   Problem: Coping: Goal: Level of anxiety will decrease Outcome: Progressing   Problem: Elimination: Goal: Will not experience complications related to bowel motility Outcome: Progressing Goal: Will not experience complications related to urinary retention Outcome: Progressing   Problem: Pain Managment: Goal: General experience of comfort will improve Outcome: Progressing   Problem: Safety: Goal: Ability to remain free from injury will  improve Outcome: Progressing   Problem: Skin Integrity: Goal: Risk for impaired skin integrity will decrease Outcome: Progressing

## 2019-09-23 DIAGNOSIS — D696 Thrombocytopenia, unspecified: Secondary | ICD-10-CM

## 2019-09-23 DIAGNOSIS — D649 Anemia, unspecified: Secondary | ICD-10-CM

## 2019-09-23 LAB — KAPPA/LAMBDA LIGHT CHAINS
Kappa free light chain: 104.7 mg/L — ABNORMAL HIGH (ref 3.3–19.4)
Kappa, lambda light chain ratio: 1.41 (ref 0.26–1.65)
Lambda free light chains: 74.4 mg/L — ABNORMAL HIGH (ref 5.7–26.3)

## 2019-09-23 LAB — BASIC METABOLIC PANEL
Anion gap: 9 (ref 5–15)
BUN: 61 mg/dL — ABNORMAL HIGH (ref 8–23)
CO2: 21 mmol/L — ABNORMAL LOW (ref 22–32)
Calcium: 8.2 mg/dL — ABNORMAL LOW (ref 8.9–10.3)
Chloride: 107 mmol/L (ref 98–111)
Creatinine, Ser: 5.69 mg/dL — ABNORMAL HIGH (ref 0.61–1.24)
GFR calc Af Amer: 11 mL/min — ABNORMAL LOW (ref >60)
GFR calc non Af Amer: 9 mL/min — ABNORMAL LOW (ref >60)
Glucose, Bld: 143 mg/dL — ABNORMAL HIGH (ref 70–99)
Potassium: 3.2 mmol/L — ABNORMAL LOW (ref 3.5–5.1)
Sodium: 137 mmol/L (ref 135–145)

## 2019-09-23 LAB — RENAL FUNCTION PANEL
Albumin: 2.9 g/dL — ABNORMAL LOW (ref 3.5–5.0)
Anion gap: 11 (ref 5–15)
BUN: 62 mg/dL — ABNORMAL HIGH (ref 8–23)
CO2: 20 mmol/L — ABNORMAL LOW (ref 22–32)
Calcium: 8.3 mg/dL — ABNORMAL LOW (ref 8.9–10.3)
Chloride: 106 mmol/L (ref 98–111)
Creatinine, Ser: 5.85 mg/dL — ABNORMAL HIGH (ref 0.61–1.24)
GFR calc Af Amer: 10 mL/min — ABNORMAL LOW (ref >60)
GFR calc non Af Amer: 9 mL/min — ABNORMAL LOW (ref >60)
Glucose, Bld: 168 mg/dL — ABNORMAL HIGH (ref 70–99)
Phosphorus: 4.6 mg/dL (ref 2.5–4.6)
Potassium: 3.4 mmol/L — ABNORMAL LOW (ref 3.5–5.1)
Sodium: 137 mmol/L (ref 135–145)

## 2019-09-23 LAB — ENA+DNA/DS+SJORGEN'S
ENA SM Ab Ser-aCnc: <0.2 AI (ref 0.0–0.9)
Ribonucleic Protein: 0.2 AI (ref 0.0–0.9)
SSA (Ro) (ENA) Antibody, IgG: <0.2 AI (ref 0.0–0.9)
SSB (La) (ENA) Antibody, IgG: <0.2 AI (ref 0.0–0.9)
ds DNA Ab: <1 IU/mL (ref 0–9)

## 2019-09-23 LAB — C3 COMPLEMENT: C3 Complement: 129 mg/dL (ref 82–167)

## 2019-09-23 LAB — GLOMERULAR BASEMENT MEMBRANE ANTIBODIES: GBM Ab: 3 units (ref 0–20)

## 2019-09-23 LAB — ANA W/REFLEX: Anti Nuclear Antibody (ANA): POSITIVE — AB

## 2019-09-23 LAB — C4 COMPLEMENT: Complement C4, Body Fluid: 26 mg/dL (ref 12–38)

## 2019-09-23 MED ORDER — LABETALOL HCL 100 MG PO TABS
100.0000 mg | ORAL_TABLET | Freq: Two times a day (BID) | ORAL | Status: DC
  Administered 2019-09-23 – 2019-09-30 (×15): 100 mg via ORAL
  Filled 2019-09-23 (×16): qty 1

## 2019-09-23 NOTE — Progress Notes (Signed)
Per pt daughter they do not want pt getting IV put in until in the morning.

## 2019-09-23 NOTE — Progress Notes (Signed)
Patient ID: Calum Cormier, male   DOB: [birth year], 74 y.o.   MRN: [id number] [facility] PROGRESS NOTE  Earlie Schank LMB:[id number] DOB: [birth year] DOA: 09/20/2019 PCP: Verl Bangs, FNP  HPI/Subjective: Patient states he is eating and drinking even though it does not taste good.  He is urinating well.  No nausea or vomiting.  Objective: Vitals:   09/23/19 0825 09/23/19 0914  BP: (!) 154/70   Pulse: (!) 59 61  Resp: 18   Temp: 98.2 F (36.8 C)   SpO2: 94%     Intake/Output Summary (Last 24 hours) at 09/23/2019 1502 Last data filed at 09/23/2019 1414 Gross per 24 hour  Intake 240 ml  Output --  Net 240 ml   Filed Weights   09/20/19 1055  Weight: 53.5 kg    ROS: Review of Systems  Constitutional: Negative for fever.  Eyes: Negative for blurred vision.  Respiratory: Negative for shortness of breath.   Cardiovascular: Negative for chest pain.  Gastrointestinal: Positive for nausea. Negative for abdominal pain and vomiting.  Genitourinary: Negative for dysuria.  Musculoskeletal: Negative for joint pain.  Neurological: Negative for headaches.   Exam: Physical Exam  Constitutional: He is oriented to person, place, and time.  HENT:  Nose: No mucosal edema.  Mouth/Throat: No oropharyngeal exudate or posterior oropharyngeal edema.  Eyes: Pupils are equal, round, and reactive to light. Conjunctivae and lids are normal.  Neck: Carotid bruit is not present.  Cardiovascular: S1 normal and S2 normal. Exam reveals no gallop.  No murmur heard. Respiratory: No respiratory distress. He has no wheezes. He has no rhonchi. He has no rales.  GI: Soft. There is no abdominal tenderness.  Musculoskeletal:     Right ankle: Swelling present.     Left ankle: Swelling present.  Lymphadenopathy:    He has no cervical adenopathy.  Neurological: He is alert and oriented to person, place, and time. No cranial nerve deficit.  Skin: Skin is warm. No rash noted. Nails show no  clubbing.  Psychiatric: He has a normal mood and affect.      Data Reviewed: Basic Metabolic Panel: Recent Labs  Lab 09/19/19 1130 09/19/19 1130 09/19/19 1131 09/20/19 1113 09/21/19 0421 09/22/19 0422 09/23/19 0448  NA 139  --   --  139 138 139 137  K 3.6  --   --  3.5 3.3* 3.4* 3.2*  CL 103  --   --  102 107 107 107  CO2 25  --   --  24 22 24  21*  GLUCOSE 120*   < > CANCELED 125* 128* 141* 143*  BUN 54*  --   --  56* 59* 57* 61*  CREATININE 4.69*  --   --  5.03* 5.19* 5.36* 5.69*  CALCIUM 9.2  --   --  8.9 8.0* 8.6* 8.2*   < > = values in this interval not displayed.   Liver Function Tests: Recent Labs  Lab 09/19/19 1130 09/20/19 1113  AST 25 30  ALT 35 41  ALKPHOS  --  156*  BILITOT 0.7 1.1  PROT 6.8 7.2  ALBUMIN  --  3.7   CBC: Recent Labs  Lab 09/19/19 1125 09/19/19 1130 09/19/19 1131 09/20/19 1113 09/21/19 0421 09/22/19 0422  WBC CANCELED 9.8 CANCELED 10.1 9.4  --   NEUTROABS  --  6,958  --  7.0  --   --   HGB  --  12.1*  --  11.7* 9.7* 10.3*  HCT  --  36.8*  --  33.8* 28.1*  --   MCV  --  93.6  --  89.2 89.8  --   PLT  --  152  --  153 142*  --    Cardiac Enzymes: Recent Labs  Lab 09/20/19 1136  CKTOTAL 59     Recent Results (from the past 240 hour(s))  Respiratory Panel by RT PCR (Flu A&B, Covid) - Nasopharyngeal Swab     Status: None   Collection Time: 09/20/19 11:13 AM   Specimen: Nasopharyngeal Swab  Result Value Ref Range Status   SARS Coronavirus 2 by RT PCR NEGATIVE NEGATIVE Final    Comment: (NOTE) SARS-CoV-2 target nucleic acids are NOT DETECTED. The SARS-CoV-2 RNA is generally detectable in upper respiratoy specimens during the acute phase of infection. The lowest concentration of SARS-CoV-2 viral copies this assay can detect is 131 copies/mL. A negative result does not preclude SARS-Cov-2 infection and should not be used as the sole basis for treatment or other patient management decisions. A negative result may occur with   improper specimen collection/handling, submission of specimen other than nasopharyngeal swab, presence of viral mutation(s) within the areas targeted by this assay, and inadequate number of viral copies (<131 copies/mL). A negative result must be combined with clinical observations, patient history, and epidemiological information. The expected result is Negative. Fact Sheet for Patients:  PinkCheek.be Fact Sheet for Healthcare Providers:  GravelBags.it This test is not yet ap proved or cleared by the [place] FDA and  has been authorized for detection and/or diagnosis of SARS-CoV-2 by FDA under an Emergency Use Authorization (EUA). This EUA will remain  in effect (meaning this test can be used) for the duration of the COVID-19 declaration under Section 564(b)(1) of the Act, 21 U.S.C. section 360bbb-3(b)(1), unless the authorization is terminated or revoked sooner.    Influenza A by PCR NEGATIVE NEGATIVE Final   Influenza B by PCR NEGATIVE NEGATIVE Final    Comment: (NOTE) The Xpert Xpress SARS-CoV-2/FLU/RSV assay is intended as an aid in  the diagnosis of influenza from Nasopharyngeal swab specimens and  should not be used as a sole basis for treatment. Nasal washings and  aspirates are unacceptable for Xpert Xpress SARS-CoV-2/FLU/RSV  testing. Fact Sheet for Patients: PinkCheek.be Fact Sheet for Healthcare Providers: GravelBags.it This test is not yet approved or cleared by the [place] FDA and  has been authorized for detection and/or diagnosis of SARS-CoV-2 by  FDA under an Emergency Use Authorization (EUA). This EUA will remain  in effect (meaning this test can be used) for the duration of the  Covid-19 declaration under Section 564(b)(1) of the Act, 21  U.S.C. section 360bbb-3(b)(1), unless the authorization is  terminated or revoked. Performed at  [facility], [address] [place], [place] [address]       Scheduled Meds: . amLODipine  10 mg Oral Daily  . cloNIDine  0.1 mg Oral TID  . feeding supplement  1 Container Oral Q24H  . feeding supplement (ENSURE ENLIVE)  237 mL Oral BID BM  . labetalol  100 mg Oral BID  . multivitamin with minerals  1 tablet Oral Daily   Assessment/Plan:  1. Acute kidney injury on chronic kidney disease stage IIIa.  Creatinine a little worse today at 5.69.  GFR down to 9.  Case discussed with nephrology and they will proceed with kidney biopsy tomorrow.  Patient urinating.  Appetite better.  The patient did have a contrasted study on 08/28/2019.  Nephrology will hold off on  dialysis at this point since the patient is eating and drinking.  Follow creatinine. 2. Essential hypertension.  Continue Norvasc 10 mg daily.  I decreased labetalol to 100 mg twice daily.  Continue clonidine 3 times daily. 3. Peripheral vascular disease with recent AAA repair. 4. Tobacco abuse only smokes 4 to 5 cigarettes a day.  No need for nicotine patch 5. Hyperlipidemia.  Crestor held recently.  CPK normal range. 6. Anemia.  Continue to monitor hemoglobin intermittently 7. Thrombocytopenia.  Looking back at old labs this seems like it has been chronically a little bit low.  Hepatitis B and C profile negative.  Code Status:     Code Status Orders  (From admission, onward)         Start     Ordered   09/20/19 1416  Full code  Continuous     09/20/19 1416        Code Status History    Date Active Date Inactive Code Status Order ID Comments User Context   08/14/2019 1132 08/15/2019 2113 Full Code [id number]  Schnier, Dolores Lory, MD Inpatient   Advance Care Planning Activity     Family Communication: Spoke with the patient's daughter on the phone, wife at bedside Disposition Plan: Patient's creatinine not improving despite IV fluids and time.  Nephrology to proceed with kidney biopsy tomorrow.  Follow  creatinine daily.  Patient may end up needing dialysis if things worsen.  Goal will be to go home once nephrology feels comfortable with where his creatinine is.  Consultants:  Nephrology  Vascular surgery  Time spent: 27 minutes, case discussed with nephrology  [place]

## 2019-09-23 NOTE — Progress Notes (Signed)
PT Cancellation Note  Patient Details Name: Barron Vanloan MRN: [id number] DOB: Dec 30, 1945   Cancelled Treatment:    Reason Eval/Treat Not Completed: PT screened, no needs identified, will sign off.  Therapist was talking with pt's nurse when pt and pt's family came walking out of room.  Therapist talked with pt and pt's daughter while standing in hallway; pt's daughter reports they were going for a walk in the hallway and both pt and pt's daughter verbalized no concerns with pt's mobility and no noted physical therapy needs in general.  Therapist observed pt walking in hallway and pt appearing steady and safe with ambulation.  No acute care PT needs identified; will sign off.  Leitha Bleak, PT 09/23/19, 9:15 AM

## 2019-09-23 NOTE — Care Management Important Message (Signed)
Important Message  Patient Details  Name: Scott Oconnor MRN: [id number] Date of Birth: 1946/04/08   Medicare Important Message Given:  Yes     Juliann Pulse A Zamira Hickam 09/23/2019, 11:48 AM

## 2019-09-23 NOTE — Progress Notes (Signed)
[place], [place] 09/23/19  Subjective:   Hospital day # 3 Overall patient states that he is doing better today.  He states he is able to eat and drink more.  He is able to void more.  Very sleepy BP is better controlled Daughter is in the room    Objective:  Vital signs in last 24 hours:  Temp:  [97.6 F (36.4 C)-98.8 F (37.1 C)] 98.2 F (36.8 C) (05/03 0825) Pulse Rate:  [58-73] 61 (05/03 0914) Resp:  [16-18] 18 (05/03 0825) BP: (133-158)/(61-71) 154/70 (05/03 0825) SpO2:  [93 %-98 %] 94 % (05/03 0825)  Weight change:  Filed Weights   09/20/19 1055  Weight: 53.5 kg    Intake/Output:    Intake/Output Summary (Last 24 hours) at 09/23/2019 1124 Last data filed at 09/23/2019 1012 Gross per 24 hour  Intake 120 ml  Output --  Net 120 ml    Gen:   Alert, cooperative, no distress, appears stated age Head:   Normocephalic, without obvious abnormality, atraumatic Eyes/ENT:  Conjunctiva clear,  moist oral mucus membranes Neck:  Supple,  thyroid: not enlarged, no JVD Lungs:   Clear to auscultation bilaterally, respirations unlabored Heart:   Regular rate and rhythm,  Abdomen:   Soft, non-tender,  Extremities: trace edema bilaterally Skin:  Skin color, texture, turgor normal, no rashes or lesions Neurologic: Alert, able to follow commands      Basic Metabolic Panel:  Recent Labs  Lab 09/19/19 1130 09/19/19 1130 09/19/19 1131 09/20/19 1113 09/20/19 1113 09/21/19 0421 09/22/19 0422 09/23/19 0448  NA 139  --   --  139  --  138 139 137  K 3.6  --   --  3.5  --  3.3* 3.4* 3.2*  CL 103  --   --  102  --  107 107 107  CO2 25  --   --  24  --  22 24 21*  GLUCOSE 120*   < > CANCELED 125*  --  128* 141* 143*  BUN 54*  --   --  56*  --  59* 57* 61*  CREATININE 4.69*  --   --  5.03*  --  5.19* 5.36* 5.69*  CALCIUM 9.2   < >  --  8.9   < > 8.0* 8.6* 8.2*   < > = values in this interval not displayed.     CBC: Recent Labs  Lab  09/19/19 1125 09/19/19 1130 09/19/19 1131 09/20/19 1113 09/21/19 0421 09/22/19 0422  WBC CANCELED 9.8 CANCELED 10.1 9.4  --   NEUTROABS  --  6,958  --  7.0  --   --   HGB  --  12.1*  --  11.7* 9.7* 10.3*  HCT  --  36.8*  --  33.8* 28.1*  --   MCV  --  93.6  --  89.2 89.8  --   PLT  --  152  --  153 142*  --       Lab Results  Component Value Date   HEPBSAG NON REACTIVE 09/20/2019   HEPBSAB NON REACTIVE 09/20/2019   HEPBIGM NON REACTIVE 09/20/2019      Microbiology:  Recent Results (from the past 240 hour(s))  Respiratory Panel by RT PCR (Flu A&B, Covid) - Nasopharyngeal Swab     Status: None   Collection Time: 09/20/19 11:13 AM   Specimen: Nasopharyngeal Swab  Result Value Ref Range Status   SARS Coronavirus 2 by RT PCR NEGATIVE  NEGATIVE Final    Comment: (NOTE) SARS-CoV-2 target nucleic acids are NOT DETECTED. The SARS-CoV-2 RNA is generally detectable in upper respiratoy specimens during the acute phase of infection. The lowest concentration of SARS-CoV-2 viral copies this assay can detect is 131 copies/mL. A negative result does not preclude SARS-Cov-2 infection and should not be used as the sole basis for treatment or other patient management decisions. A negative result may occur with  improper specimen collection/handling, submission of specimen other than nasopharyngeal swab, presence of viral mutation(s) within the areas targeted by this assay, and inadequate number of viral copies (<131 copies/mL). A negative result must be combined with clinical observations, patient history, and epidemiological information. The expected result is Negative. Fact Sheet for Patients:  PinkCheek.be Fact Sheet for Healthcare Providers:  GravelBags.it This test is not yet ap proved or cleared by the [place] FDA and  has been authorized for detection and/or diagnosis of SARS-CoV-2 by FDA under an Emergency Use  Authorization (EUA). This EUA will remain  in effect (meaning this test can be used) for the duration of the COVID-19 declaration under Section 564(b)(1) of the Act, 21 U.S.C. section 360bbb-3(b)(1), unless the authorization is terminated or revoked sooner.    Influenza A by PCR NEGATIVE NEGATIVE Final   Influenza B by PCR NEGATIVE NEGATIVE Final    Comment: (NOTE) The Xpert Xpress SARS-CoV-2/FLU/RSV assay is intended as an aid in  the diagnosis of influenza from Nasopharyngeal swab specimens and  should not be used as a sole basis for treatment. Nasal washings and  aspirates are unacceptable for Xpert Xpress SARS-CoV-2/FLU/RSV  testing. Fact Sheet for Patients: PinkCheek.be Fact Sheet for Healthcare Providers: GravelBags.it This test is not yet approved or cleared by the [place] FDA and  has been authorized for detection and/or diagnosis of SARS-CoV-2 by  FDA under an Emergency Use Authorization (EUA). This EUA will remain  in effect (meaning this test can be used) for the duration of the  Covid-19 declaration under Section 564(b)(1) of the Act, 21  U.S.C. section 360bbb-3(b)(1), unless the authorization is  terminated or revoked. Performed at [facility], [address] [place], [place] [address]     Coagulation Studies: No results for input(s): LABPROT, INR in the last 72 hours.  Urinalysis: Recent Labs    09/20/19 1418  COLORURINE YELLOW*  LABSPEC 1.009  PHURINE 6.0  GLUCOSEU NEGATIVE  HGBUR NEGATIVE  BILIRUBINUR NEGATIVE  KETONESUR NEGATIVE  PROTEINUR >=300*  NITRITE NEGATIVE  LEUKOCYTESUR NEGATIVE      Imaging: No results found.   Medications:    . amLODipine  10 mg Oral Daily  . cloNIDine  0.1 mg Oral TID  . feeding supplement  1 Container Oral Q24H  . feeding supplement (ENSURE ENLIVE)  237 mL Oral BID BM  . labetalol  100 mg Oral BID  . multivitamin with minerals  1 tablet  Oral Daily   acetaminophen, ondansetron **OR** ondansetron (ZOFRAN) IV  Assessment/ Plan:  74 y.o. 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 Acute renal failure 2. Chronic kidney disease stage IIIA: baseline creatinine of 1.28, GFR of 56 on 08/12/19;  possibly new baseline 1.66 on August 28, 2019 3. Proteinuria 4. Eosinophilia 5. Anemia with renal failure 6. AAA  6 cm.  Status post endovascular repair 3/25.  Complicated by embolization of both great toes.  Post procedure cr 1.66 on Apr 7 ; Repeat IV contrast exposure 75 cc on April 7 for CT angiogram 7.  Hypokalemia 8.  Hypertension   Differential diagnosis includes IV contrast-induced nephropathy, cholesterol embolic disease, acute interstitial nephritis, prerenal leading to ATN Work-up so far includes renal ultrasound which shows normal-sized kidney and mild bilateral increased echogenicity Proteinuria - Urine protein to creatinine ratio 3.9 gm.  0-5 RBCs Serologies are pending Complements normal Abti GBM Neg  Discussed with patient and his daughter about proceeding with kidney biopsy tomorrow since serum creatinine is not improving. Discussed risks and benefits. They have agreed to proceed. Hopefully will have a preliminary by Wednesday afternoon Blood pressure control is better with current regimen Liberal diet    LOS: [place] 5/3/202111:24 AM  [place], [place]  Note: This note was prepared with Dragon dictation. Any transcription errors are unintentional

## 2019-09-23 NOTE — Progress Notes (Signed)
[facility] Daily Progress Note   Subjective: Sleeping. Daughter at bedside.  Objective: Vitals:   09/22/19 2019 09/22/19 2327 09/23/19 0825 09/23/19 0914  BP: (!) 156/64 133/61 (!) 154/70   Pulse: 66 (!) 58 (!) 59 61  Resp: 16 16 18    Temp: 98.8 F (37.1 C) 97.6 F (36.4 C) 98.2 F (36.8 C)   TempSrc: Oral Oral Oral   SpO2: 96% 93% 94%   Weight:      Height:        Intake/Output Summary (Last 24 hours) at 09/23/2019 1104 Last data filed at 09/23/2019 1012 Gross per 24 hour  Intake 120 ml  Output --  Net 120 ml   Physical Exam: A&Ox3, NAD CV: RRR Pulmonary: CTA Bilaterally Abdomen: Soft, Nontender, Nondistended Vascular: Warm, Non-tender, Minimal edema   Laboratory: CBC    Component Value Date/Time   WBC 9.4 09/21/2019 0421   HGB 10.3 (L) 09/22/2019 0422   HCT 28.1 (L) 09/21/2019 0421   PLT 142 (L) 09/21/2019 0421   BMET    Component Value Date/Time   NA 137 09/23/2019 0448   K 3.2 (L) 09/23/2019 0448   CL 107 09/23/2019 0448   CO2 21 (L) 09/23/2019 0448   GLUCOSE 143 (H) 09/23/2019 0448   BUN 61 (H) 09/23/2019 0448   CREATININE 5.69 (H) 09/23/2019 0448   CREATININE 4.69 (H) 09/19/2019 1130   CALCIUM 8.2 (L) 09/23/2019 0448   GFRNONAA 9 (L) 09/23/2019 0448   GFRNONAA 11 (L) 09/19/2019 1130   GFRAA 11 (L) 09/23/2019 0448   GFRAA 13 (L) 09/19/2019 1130   Assessment/Planning: The patient is a 74 year old male s/p endovascular AAA repair on 08/14/19 with ongoing weakness and lethargy s/p intervention. Patient admitted with AKI and hypertension.  1) AKI: Creatinine still increasing - today 5.6 Potassium today 3.2 Patient feels well - minimal uremic symptoms  2) Hypertension: Better controlled this AM (154/70)  Discussed with Dr. Eber Hong Jun Osment PA-C 09/23/2019 11:04 AM

## 2019-09-23 NOTE — Plan of Care (Signed)
  Problem: Education: Goal: Knowledge of disease and its progression will improve Outcome: Progressing   Problem: Fluid Volume: Goal: Fluid volume balance will be maintained or improved Outcome: Progressing   Problem: Urinary Elimination: Goal: Progression of disease will be identified and treated Outcome: Progressing   Problem: Education: Goal: Knowledge of General Education information will improve Description: Including pain rating scale, medication(s)/side effects and non-pharmacologic comfort measures Outcome: Progressing   Problem: Education: Goal: Knowledge of General Education information will improve Description: Including pain rating scale, medication(s)/side effects and non-pharmacologic comfort measures Outcome: Progressing   Problem: Health Behavior/Discharge Planning: Goal: Ability to manage health-related needs will improve Outcome: Progressing   Problem: Clinical Measurements: Goal: Ability to maintain clinical measurements within normal limits will improve Outcome: Progressing Goal: Will remain free from infection Outcome: Progressing Goal: Diagnostic test results will improve Outcome: Progressing Goal: Respiratory complications will improve Outcome: Progressing Goal: Cardiovascular complication will be avoided Outcome: Progressing   Problem: Activity: Goal: Risk for activity intolerance will decrease Outcome: Progressing   Problem: Nutrition: Goal: Adequate nutrition will be maintained Outcome: Progressing   Problem: Coping: Goal: Level of anxiety will decrease Outcome: Progressing   Problem: Elimination: Goal: Will not experience complications related to bowel motility Outcome: Progressing Goal: Will not experience complications related to urinary retention Outcome: Progressing   Problem: Pain Managment: Goal: General experience of comfort will improve Outcome: Progressing   Problem: Safety: Goal: Ability to remain free from injury will  improve Outcome: Progressing   Problem: Skin Integrity: Goal: Risk for impaired skin integrity will decrease Outcome: Progressing

## 2019-09-24 ENCOUNTER — Inpatient Hospital Stay: Payer: Medicare Other

## 2019-09-24 LAB — BASIC METABOLIC PANEL
Anion gap: 8 (ref 5–15)
BUN: 66 mg/dL — ABNORMAL HIGH (ref 8–23)
CO2: 22 mmol/L (ref 22–32)
Calcium: 8.4 mg/dL — ABNORMAL LOW (ref 8.9–10.3)
Chloride: 107 mmol/L (ref 98–111)
Creatinine, Ser: 6.23 mg/dL — ABNORMAL HIGH (ref 0.61–1.24)
GFR calc Af Amer: 9 mL/min — ABNORMAL LOW (ref >60)
GFR calc non Af Amer: 8 mL/min — ABNORMAL LOW (ref >60)
Glucose, Bld: 144 mg/dL — ABNORMAL HIGH (ref 70–99)
Potassium: 3.1 mmol/L — ABNORMAL LOW (ref 3.5–5.1)
Sodium: 137 mmol/L (ref 135–145)

## 2019-09-24 LAB — HEAVY METALS PANEL, BLOOD: Lead: 1 ug/dL (ref <5)

## 2019-09-24 LAB — HEMOGLOBIN
Hemoglobin: 8.9 g/dL — ABNORMAL LOW (ref 13.0–17.0)
Hemoglobin: 9.7 g/dL — ABNORMAL LOW (ref 13.0–17.0)

## 2019-09-24 LAB — PROTEIN ELECTRO, RANDOM URINE
Albumin ELP, Urine: 57.4 %
Alpha-1-Globulin, U: 8.5 %
Alpha-2-Globulin, U: 6.5 %
Beta Globulin, U: 14.2 %
Gamma Globulin, U: 13.5 %
Total Protein, Urine: 297.2 mg/dL

## 2019-09-24 LAB — URINALYSIS, MICROSCOPIC ONLY

## 2019-09-24 LAB — APTT: aPTT: 41 seconds — ABNORMAL HIGH (ref 24–36)

## 2019-09-24 LAB — COMPLETE METABOLIC PANEL WITH GFR

## 2019-09-24 LAB — PROTIME-INR
INR: 1.1 (ref 0.8–1.2)
Prothrombin Time: 13.9 seconds (ref 11.4–15.2)

## 2019-09-24 LAB — THYROID PANEL WITH TSH

## 2019-09-24 LAB — EOSINOPHIL, URINE: Eosinophil, Urine: NONE SEEN %

## 2019-09-24 LAB — CBC WITH DIFFERENTIAL/PLATELET

## 2019-09-24 MED ORDER — MIDAZOLAM HCL 2 MG/2ML IJ SOLN
INTRAMUSCULAR | Status: AC
  Filled 2019-09-24: qty 4

## 2019-09-24 MED ORDER — FENTANYL CITRATE (PF) 100 MCG/2ML IJ SOLN
INTRAMUSCULAR | Status: AC | PRN
  Administered 2019-09-24 (×2): 25 ug via INTRAVENOUS

## 2019-09-24 MED ORDER — CLONIDINE HCL 0.1 MG PO TABS
0.1000 mg | ORAL_TABLET | Freq: Two times a day (BID) | ORAL | Status: DC
  Administered 2019-09-24: 0.1 mg via ORAL
  Filled 2019-09-24: qty 1

## 2019-09-24 MED ORDER — SODIUM CHLORIDE 0.9 % IV SOLN: INTRAVENOUS | Status: DC

## 2019-09-24 MED ORDER — FENTANYL CITRATE (PF) 100 MCG/2ML IJ SOLN
INTRAMUSCULAR | Status: AC
  Filled 2019-09-24: qty 4

## 2019-09-24 MED ORDER — MIDAZOLAM HCL 2 MG/2ML IJ SOLN
INTRAMUSCULAR | Status: AC | PRN
  Administered 2019-09-24 (×2): 0.5 mg via INTRAVENOUS

## 2019-09-24 NOTE — Progress Notes (Signed)
Patient ID: Scott Oconnor, male   DOB: 11-27-1945, 74 y.o.   MRN: [id number] Patient ID: Scott Oconnor, male   DOB: 28-Jun-1945, 74 y.o.   MRN: [id number] [facility] PROGRESS NOTE  [name] EXN:[id number] DOB: 1945-09-18 DOA: 09/20/2019 PCP: Verl Bangs, FNP  HPI/Subjective: Patient seen right after kidney biopsy.  Felt okay.  Family asked when he can eat.  Patient fell asleep while I was in the room.  Objective: Vitals:   09/24/19 1224 09/24/19 1311  BP: 136/63 (!) 168/76  Pulse: (!) 56 72  Resp: 16 16  Temp: 98.1 F (36.7 C) 98.2 F (36.8 C)  SpO2: 92% 93%    Intake/Output Summary (Last 24 hours) at 09/24/2019 1550 Last data filed at 09/24/2019 1300 Gross per 24 hour  Intake 120 ml  Output --  Net 120 ml   Filed Weights   09/20/19 1055  Weight: 53.5 kg    ROS: Review of Systems  Unable to perform ROS: Acuity of condition   Exam: Physical Exam  Constitutional: He is oriented to person, place, and time.  HENT:  Nose: No mucosal edema.  Mouth/Throat: No oropharyngeal exudate or posterior oropharyngeal edema.  Eyes: Pupils are equal, round, and reactive to light. Conjunctivae and lids are normal.  Neck: Carotid bruit is not present.  Cardiovascular: S1 normal and S2 normal. Exam reveals no gallop.  No murmur heard. Respiratory: No respiratory distress. He has no wheezes. He has no rhonchi. He has no rales.  GI: Soft. There is no abdominal tenderness.  Musculoskeletal:     Right ankle: Swelling present.     Left ankle: Swelling present.  Lymphadenopathy:    He has no cervical adenopathy.  Neurological: He is alert and oriented to person, place, and time. No cranial nerve deficit.  Skin: Skin is warm. No rash noted. Nails show no clubbing.  Psychiatric: He has a normal mood and affect.      Data Reviewed: Basic Metabolic Panel: Recent Labs  Lab 09/21/19 0421 09/22/19 0422 09/23/19 0448 09/23/19 1801 09/24/19 0445  NA 138 139 137 137  137  K 3.3* 3.4* 3.2* 3.4* 3.1*  CL 107 107 107 106 107  CO2 22 24 21* 20* 22  GLUCOSE 128* 141* 143* 168* 144*  BUN 59* 57* 61* 62* 66*  CREATININE 5.19* 5.36* 5.69* 5.85* 6.23*  CALCIUM 8.0* 8.6* 8.2* 8.3* 8.4*  PHOS  --   --   --  4.6  --    Liver Function Tests: Recent Labs  Lab 09/19/19 1130 09/20/19 1113 09/23/19 1801  AST 25 30  --   ALT 35 41  --   ALKPHOS  --  156*  --   BILITOT 0.7 1.1  --   PROT 6.8 7.2  --   ALBUMIN  --  3.7 2.9*   CBC: Recent Labs  Lab 09/19/19 1125 09/19/19 1130 09/19/19 1131 09/20/19 1113 09/21/19 0421 09/22/19 0422 09/24/19 0445  WBC CANCELED 9.8 CANCELED 10.1 9.4  --   --   NEUTROABS  --  6,958  --  7.0  --   --   --   HGB  --  12.1*  --  11.7* 9.7* 10.3* 8.9*  HCT  --  36.8*  --  33.8* 28.1*  --   --   MCV  --  93.6  --  89.2 89.8  --   --   PLT  --  152  --  153 142*  --   --  Cardiac Enzymes: Recent Labs  Lab 09/20/19 1136  CKTOTAL 59     Recent Results (from the past 240 hour(s))  Respiratory Panel by RT PCR (Flu A&B, Covid) - Nasopharyngeal Swab     Status: None   Collection Time: 09/20/19 11:13 AM   Specimen: Nasopharyngeal Swab  Result Value Ref Range Status   SARS Coronavirus 2 by RT PCR NEGATIVE NEGATIVE Final    Comment: (NOTE) SARS-CoV-2 target nucleic acids are NOT DETECTED. The SARS-CoV-2 RNA is generally detectable in upper respiratoy specimens during the acute phase of infection. The lowest concentration of SARS-CoV-2 viral copies this assay can detect is 131 copies/mL. A negative result does not preclude SARS-Cov-2 infection and should not be used as the sole basis for treatment or other patient management decisions. A negative result may occur with  improper specimen collection/handling, submission of specimen other than nasopharyngeal swab, presence of viral mutation(s) within the areas targeted by this assay, and inadequate number of viral copies (<131 copies/mL). A negative result must be combined  with clinical observations, patient history, and epidemiological information. The expected result is Negative. Fact Sheet for Patients:  PinkCheek.be Fact Sheet for Healthcare Providers:  GravelBags.it This test is not yet ap proved or cleared by the [place] FDA and  has been authorized for detection and/or diagnosis of SARS-CoV-2 by FDA under an Emergency Use Authorization (EUA). This EUA will remain  in effect (meaning this test can be used) for the duration of the COVID-19 declaration under Section 564(b)(1) of the Act, 21 U.S.C. section 360bbb-3(b)(1), unless the authorization is terminated or revoked sooner.    Influenza A by PCR NEGATIVE NEGATIVE Final   Influenza B by PCR NEGATIVE NEGATIVE Final    Comment: (NOTE) The Xpert Xpress SARS-CoV-2/FLU/RSV assay is intended as an aid in  the diagnosis of influenza from Nasopharyngeal swab specimens and  should not be used as a sole basis for treatment. Nasal washings and  aspirates are unacceptable for Xpert Xpress SARS-CoV-2/FLU/RSV  testing. Fact Sheet for Patients: PinkCheek.be Fact Sheet for Healthcare Providers: GravelBags.it This test is not yet approved or cleared by the [place] FDA and  has been authorized for detection and/or diagnosis of SARS-CoV-2 by  FDA under an Emergency Use Authorization (EUA). This EUA will remain  in effect (meaning this test can be used) for the duration of the  Covid-19 declaration under Section 564(b)(1) of the Act, 21  U.S.C. section 360bbb-3(b)(1), unless the authorization is  terminated or revoked. Performed at [facility], [address] [place], [place] [address]       Scheduled Meds: . amLODipine  10 mg Oral Daily  . cloNIDine  0.1 mg Oral BID  . feeding supplement  1 Container Oral Q24H  . feeding supplement (ENSURE ENLIVE)  237 mL Oral BID BM   . fentaNYL      . labetalol  100 mg Oral BID  . midazolam      . multivitamin with minerals  1 tablet Oral Daily   Assessment/Plan:  1. Acute kidney injury on chronic kidney disease stage IIIa.  Creatinine a little worse today at 6.23.  GFR down to 9.  Patient had kidney biopsy today patient urinating.  Appetite better.  The patient did have a contrasted study on 08/28/2019.  Nephrology will hold off on dialysis at this point since the patient is eating and drinking.  Treatment plan will be set up based on kidney biopsy results. 2. Essential hypertension.  Continue Norvasc  10 mg daily.  I decreased labetalol to 100 mg twice daily.  Continue clonidine 3 times daily. 3. Peripheral vascular disease with recent AAA repair. 4. Tobacco abuse only smokes 4 to 5 cigarettes a day.  No need for nicotine patch 5. Hyperlipidemia.  Crestor held recently.  CPK normal range. 6. Anemia.  Likely of chronic kidney disease. 7. Thrombocytopenia.  Looking back at old labs this seems like it has been chronically a little bit low.  Hepatitis B and C profile negative.  Code Status:     Code Status Orders  (From admission, onward)         Start     Ordered   09/20/19 1416  Full code  Continuous     09/20/19 1416        Code Status History    Date Active Date Inactive Code Status Order ID Comments User Context   08/14/2019 1132 08/15/2019 2113 Full Code [id number]  Schnier, Dolores Lory, MD Inpatient   Advance Care Planning Activity     Family Communication: Spoke with the patient's daughter at the bedside Disposition Plan: With the patient's creatinine worsening on a daily basis I do not have a disposition date just yet.  Kidney biopsy done today.  Based on those results, nephrology will set up a treatment plan.  Since the patient is eating and drinking and not uremic, nephrology wanted to wait on kidney biopsy results to determine whether or not to start dialysis or not.  If the patient needs to start dialysis  then patient would need 3 more days here in the hospital.  Patient will go home once nephrology set up a treatment plan.  Consultants:  Nephrology  Vascular surgery  Time spent: 25 minutes  [facility]

## 2019-09-24 NOTE — Procedures (Signed)
Interventional Radiology Procedure Note  Procedure: US Guided Biopsy of left kidney  Complications: None  Estimated Blood Loss: < 10 mL  Findings: 16 G core biopsy of left lower pole renal cortex performed under US guidance.  Two core samples obtained and sent to Pathology.  Kirin Pastorino T. Misaki Sozio, M.D Pager:  [phone]   

## 2019-09-24 NOTE — Consult Note (Signed)
Chief Complaint: Patient was seen in consultation today for a random renal biopsy.  Referring Physician(s): Murlean Iba  Supervising Physician: Aletta Edouard  Patient Status: [facility] - In-pt  History of Present Illness: Scott Oconnor is a 74 y.o. male with a past medical history significant for HLD, HTN, AAA and PVD who presented to the ED on 4/30 with complaints of high blood pressure and acute kidney injury. He underwent AAA repair on 3/24 with Dr. Delana Meyer and had a follow up CTA on 4/7 which showed no endovascular leak. He presented to his PCP's office on 4/29 with complaints of fatigue and nausea, labs were obtained at this visit which showed creatinine 4.69 (previously 1.66 on 4/7) and BUN 54 (previously 24 on 4/7) - he was directed to go to the ED for further evaluation. He was admitted to [facility] for further evaluation and management, thus far his workup has been notable for proteinuria with normal sized kidneys on [place], negative anti GBM and normal complements. IR has been asked to perform a random renal biopsy to further direct care.  Scott Oconnor denies any complaints currently, he is tired this morning. Per floor RN his blood pressure was elevated this morning and he has been given his 10 am medications early in anticipation of the procedure. He states that the kidney biopsy was explained to him previously and he would like to proceed today.  Past Medical History:  Diagnosis Date  . Hyperlipidemia   . Hypertension   . PVD (peripheral vascular disease) ([place])   . Reynolds syndrome [facility])     Past Surgical History:  Procedure Laterality Date  . ENDOVASCULAR REPAIR/STENT GRAFT N/A 08/14/2019   Procedure: ENDOVASCULAR REPAIR/STENT GRAFT;  Surgeon: Katha Cabal, MD;  Location: [facility];  Service: Cardiovascular;  Laterality: N/A;    Allergies: Patient has no known allergies.  Medications: Prior to Admission medications   Medication Sig Start Date End  Date Taking? Authorizing Provider  acetaminophen (TYLENOL) 500 MG tablet Take 500 mg by mouth every 8 (eight) hours as needed for moderate pain.    Yes [provider]  amLODipine (NORVASC) 5 MG tablet Take 1 tablet (5 mg total) by mouth daily. 07/22/19  Yes Schnier, Dolores Lory, MD  aspirin EC 81 MG tablet Take 81 mg by mouth at bedtime.    Yes [provider]  Multiple Vitamin (MULTIVITAMIN) capsule Take 1 capsule by mouth daily.   Yes [provider]  promethazine (PHENERGAN) 12.5 MG tablet Take 0.5 tablets (6.25 mg total) by mouth every 8 (eight) hours as needed for nausea or vomiting. 09/19/19  Yes Malfi, Lupita Raider, FNP     Family History  Problem Relation Age of Onset  . Heart disease Father   . Sudden Cardiac Death Father   . Heart disease Brother   . Hyperlipidemia Brother   . Heart disease Sister   . Heart disease Brother   . Hyperlipidemia Brother   . Hyperlipidemia Brother   . Hyperlipidemia Brother   . Hyperlipidemia Brother   . Hyperlipidemia Brother   . Heart disease Sister     Social History   Socioeconomic History  . Marital status: Married    Spouse name: Not on file  . Number of children: Not on file  . Years of education: Not on file  . Highest education level: Not on file  Occupational History  . Not on file  Tobacco Use  . Smoking status: Current Every Day Smoker  Packs/day: 0.33    Types: Cigarettes  . Smokeless tobacco: Never Used  . Tobacco comment: 6 cigarettes daily   Substance and Sexual Activity  . Alcohol use: No  . Drug use: No  . Sexual activity: Yes  Other Topics Concern  . Not on file  Social History Narrative  . Not on file   Social Determinants of Health   Financial Resource Strain:   . Difficulty of Paying Living Expenses:   Food Insecurity:   . Worried About Charity fundraiser in the Last Year:   . Arboriculturist in the Last Year:   Transportation Needs:   . Film/video editor (Medical):   Marland Kitchen  Lack of Transportation (Non-Medical):   Physical Activity:   . Days of Exercise per Week:   . Minutes of Exercise per Session:   Stress:   . Feeling of Stress :   Social Connections:   . Frequency of Communication with Friends and Family:   . Frequency of Social Gatherings with Friends and Family:   . Attends Religious Services:   . Active Member of Clubs or Organizations:   . Attends Archivist Meetings:   Marland Kitchen Marital Status:      Review of Systems: A 12 point ROS discussed and pertinent positives are indicated in the HPI above.  All other systems are negative.  Review of Systems  Constitutional: Negative for chills and fever.  Respiratory: Negative for cough and shortness of breath.   Cardiovascular: Negative for chest pain.  Gastrointestinal: Negative for abdominal pain, diarrhea, nausea and vomiting.  Genitourinary: Negative for dysuria and hematuria.  Musculoskeletal: Negative for back pain.  Neurological: Negative for dizziness.    Vital Signs: BP (!) 172/64 (BP Location: Right Arm)   Pulse (!) 59   Temp 98.4 F (36.9 C) (Oral)   Resp 16   Ht 5\' 10"  (1.778 m)   Wt 118 lb (53.5 kg)   SpO2 93%   BMI 16.93 kg/m   Physical Exam Vitals and nursing note reviewed.  Constitutional:      General: He is not in acute distress. HENT:     Head: Normocephalic.     Mouth/Throat:     Mouth: Mucous membranes are moist.     Pharynx: Oropharynx is clear. No oropharyngeal exudate or posterior oropharyngeal erythema.  Cardiovascular:     Rate and Rhythm: Normal rate and regular rhythm.  Pulmonary:     Effort: Pulmonary effort is normal.     Breath sounds: Normal breath sounds.  Abdominal:     Palpations: Abdomen is soft.     Tenderness: There is no abdominal tenderness.  Skin:    General: Skin is warm and dry.  Neurological:     Mental Status: He is alert and oriented to person, place, and time.  Psychiatric:        Mood and Affect: Mood normal.         Behavior: Behavior normal.        Thought Content: Thought content normal.        Judgment: Judgment normal.      MD Evaluation Airway: WNL Heart: WNL Abdomen: WNL Chest/ Lungs: WNL ASA  Classification: 2 Mallampati/Airway Score: Two   Imaging: US Renal  Result Date: 09/20/2019 CLINICAL DATA:  Renal failure. EXAM: RENAL / URINARY TRACT ULTRASOUND COMPLETE COMPARISON:  None. FINDINGS: Right Kidney: Renal measurements: 10.4 x 3.6 x 4.4 cm = volume: 87 mL. Mild cortical increased echogenicity. No mass  or hydronephrosis visualized. Left Kidney: Renal measurements: 9.4 x 4.4 x 3.4 cm = volume: 73 mL. Mild increased cortical echogenicity. No mass or hydronephrosis visualized. Bladder: Appears normal for degree of bladder distention. Other: Trace ascites. IMPRESSION: 1. Normal size kidneys without hydronephrosis. Mild bilateral increased cortical echogenicity which can be seen in medical renal disease. 2.  Trace ascites. Electronically Signed   By: Marin Olp M.D.   On: 09/20/2019 13:06   US AORTA DUPLEX LIMITED  Result Date: 09/20/2019 CLINICAL DATA:  74 year old male with a history of abdominal aortic aneurysm, status post EVAR EXAM: ULTRASOUND OF ABDOMINAL AORTA TECHNIQUE: Ultrasound examination of the abdominal aorta was performed to evaluate for abdominal aortic aneurysm. COMPARISON:  CT 08/28/2019 FINDINGS: Abdominal aortic measurements as follows: Proximal:  2.4 cm Mid:  4.4 cm Distal:  4.7 cm Right iliac artery: 1.6 cm Left iliac artery: 1.3 cm Patent EVAR IMPRESSION: Duplex demonstrates patent EVAR. Maximum diameter of the aneurysm sac measures less than 5 cm. Correlation with the baseline aneurysm size may be useful for comparison of any change. Signed, Dulcy Fanny. Dellia Nims, RPVI [facility] Electronically Signed   By: Corrie Mckusick D.O.   On: 09/20/2019 13:19   CT Angio Abd/Pel W and/or Wo Contrast  Result Date:  08/28/2019 CLINICAL DATA:  Back and abdominal pain, recent aortic intervention EXAM: CTA ABDOMEN AND PELVIS WITHOUT AND WITH CONTRAST TECHNIQUE: Multidetector CT imaging of the abdomen and pelvis was performed using the standard protocol during bolus administration of intravenous contrast. Multiplanar reconstructed images and MIPs were obtained and reviewed to evaluate the vascular anatomy. CONTRAST:  44mL OMNIPAQUE IOHEXOL 350 MG/ML SOLN COMPARISON:  07/29/2019 FINDINGS: VASCULAR Aorta: Interval infrarenal bifurcated stent graft placement, which is patent. No evidence of endoleak. 4.5 x 4.4 cm native aneurysm sac diameter (previously 4.6). Celiac: Mild narrowing just beyond the ostium of indeterminate hemodynamic significance, patent distally. SMA: Replaced hepatic arterial supply, an anatomic variant. No stenosis or occlusion. Renals: Single right, widely patent. Calcified ostial plaque involving left renal artery resulting in short segment stenosis of possible hemodynamic significance. IMA: Continued patency, no evidence of associated endoleak. Inflow: On the right, stent graft extends to distal common iliac, well apposed. Mild atheromatous plaque in the external and internal iliac branches without aneurysm or stenosis. On the left, stent graft extends to distal common iliac, well apposed. External iliac branches mildly atheromatous, without aneurysm or stenosis. Proximal Outflow: Bilateral common femoral and visualized portions of the superficial and profunda femoral arteries are patent without evidence of aneurysm, dissection, vasculitis or significant stenosis. Veins: Patent hepatic veins, portal vein, SMV, splenic vein, bilateral renal veins, IVC. No venous pathology identified. Review of the MIP images confirms the above findings. NON-VASCULAR Lower chest: No acute abnormality. Hepatobiliary: No focal liver abnormality is seen. No gallstones, gallbladder wall thickening, or biliary dilatation. Pancreas:  Unremarkable. No pancreatic ductal dilatation or surrounding inflammatory changes. Spleen: Small peripheral areas of decreased enhancement. Normal in size. Adrenals/Urinary Tract: Adrenal glands unremarkable. Symmetric renal parenchymal enhancement. No hydronephrosis. Urinary bladder physiologically distended. Stomach/Bowel: Stomach and small bowel are nondilated. Colon is decompressed, unremarkable. Lymphatic: No abdominal or pelvic adenopathy. Reproductive: Prostate enlargement with central coarse calcifications. Other: No ascites. No free air. Musculoskeletal: Right inguinal hernia containing only mesenteric fat. No fracture or worrisome bone lesion. IMPRESSION: VASCULAR 1. Interval infrarenal bifurcated stent graft placement with no evidence of endoleak, slight decrease in native aneurysm sac diameter to 4.5 cm. 2. 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 NON-VASCULAR 1. No acute findings. 2. Right inguinal hernia containing only mesenteric fat. Electronically Signed   By: Lucrezia Europe M.D.   On: 08/28/2019 11:58    Labs:  CBC: Recent Labs    08/28/19 0936 09/19/19 1125 09/19/19 1130 09/19/19 1130 09/19/19 1131 09/20/19 1113 09/21/19 0421 09/22/19 0422 09/24/19 0445  WBC 9.5   < > 9.8  --  CANCELED 10.1 9.4  --   --   HGB 12.5*  --  12.1*   < >  --  11.7* 9.7* 10.3* 8.9*  HCT 37.4*  --  36.8*  --   --  33.8* 28.1*  --   --   PLT 229  --  152  --   --  153 142*  --   --    < > = values in this interval not displayed.    COAGS: Recent Labs    08/12/19 1357 08/14/19 0727 08/28/19 0936 09/24/19 0445  INR  --  1.0 1.1 1.1  APTT 40*  --   --  41*    BMP: Recent Labs    09/22/19 0422 09/23/19 0448 09/23/19 1801 09/24/19 0445  NA 139 137 137 137  K 3.4* 3.2* 3.4* 3.1*  CL 107 107 106 107  CO2 24 21* 20* 22  GLUCOSE 141* 143* 168* 144*  BUN 57* 61* 62* 66*  CALCIUM 8.6* 8.2* 8.3* 8.4*  CREATININE 5.36* 5.69* 5.85* 6.23*  GFRNONAA 10* 9* 9* 8*   GFRAA 11* 11* 10* 9*    LIVER FUNCTION TESTS: Recent Labs    08/28/19 0936 09/19/19 1130 09/20/19 1113 09/23/19 1801  BILITOT 0.7 0.7 1.1  --   AST 26 25 30   --   ALT 32 35 41  --   ALKPHOS 81  --  156*  --   PROT 7.3 6.8 7.2  --   ALBUMIN 3.6  --  3.7 2.9*    TUMOR MARKERS: No results for input(s): AFPTM, CEA, CA199, CHROMGRNA in the last 8760 hours.  Assessment and Plan:  74 y/o M admitted for acute renal failure of unknown etiology after undergoing AAA repair on 3/24 - IR has been asked to perform a random renal biopsy to further direct care.  Patient has been NPO since midnight, no blood thinning medications currently. Afebrile, WBC 9.4, hgb 8.9, plt 142, INR 1.1.  Risks and benefits of random renal biopsy was discussed with the patient and/or patient's family including, but not limited to bleeding, infection, damage to adjacent structures or low yield requiring additional tests.  All of the questions were answered and there is agreement to proceed.  Consent signed and in chart.  Thank you for this interesting consult.  I greatly enjoyed meeting Scott Oconnor and look forward to participating in their care.  A copy of this report was sent to the requesting provider on this date.  Electronically Signed: Joaquim Nam, PA-C 09/24/2019, 8:06 AM   I spent a total of 20 Minutes in face to face in clinical consultation, greater than 50% of which was counseling/coordinating care for random renal biopsy.

## 2019-09-24 NOTE — OR Nursing (Signed)
bp 172/64. Requested primary nurse give daily BP meds early so we can perform kidney biopsy this am.

## 2019-09-24 NOTE — Progress Notes (Signed)
Scott Oconnor, [place] 09/24/19  Subjective:   Hospital day # 4 Patient had his kidney biopsy earlier this morning.  The procedure went well.  No complications noted. His daughter reports that patient has been ambulating in the hallways yesterday He is able to eat a normal diet Able to take in fluids Blood pressure is better controlled     Objective:  Vital signs in last 24 hours:  Temp:  [97.9 F (36.6 C)-98.8 F (37.1 C)] 98.2 F (36.8 C) (05/04 1311) Pulse Rate:  [54-72] 72 (05/04 1311) Resp:  [9-26] 16 (05/04 1311) BP: (123-172)/(54-76) 168/76 (05/04 1311) SpO2:  [92 %-100 %] 93 % (05/04 1311)  Weight change:  Filed Weights   09/20/19 1055  Weight: 53.5 kg    Intake/Output:    Intake/Output Summary (Last 24 hours) at 09/24/2019 1327 Last data filed at 09/23/2019 1414 Gross per 24 hour  Intake 120 ml  Output --  Net 120 ml    Gen:   Alert, cooperative, no distress, appears stated age Head:   Normocephalic, without obvious abnormality, atraumatic Eyes/ENT:  Conjunctiva clear,  moist oral mucus membranes Neck:  Supple,  thyroid: not enlarged, no JVD Lungs:   Clear to auscultation bilaterally, respirations unlabored Heart:   Regular rate and rhythm,  Abdomen:   Soft, non-tender,  Extremities: trace edema bilaterally Skin:  Skin color, texture, turgor normal, no rashes or lesions Neurologic: Alert, able to follow commands      Basic Metabolic Panel:  Recent Labs  Lab 09/21/19 0421 09/21/19 0421 09/22/19 0422 09/22/19 0422 09/23/19 0448 09/23/19 1801 09/24/19 0445  NA 138  --  139  --  137 137 137  K 3.3*  --  3.4*  --  3.2* 3.4* 3.1*  CL 107  --  107  --  107 106 107  CO2 22  --  24  --  21* 20* 22  GLUCOSE 128*  --  141*  --  143* 168* 144*  BUN 59*  --  57*  --  61* 62* 66*  CREATININE 5.19*  --  5.36*  --  5.69* 5.85* 6.23*  CALCIUM 8.0*   < > 8.6*   < > 8.2* 8.3* 8.4*  PHOS  --   --   --   --   --  4.6  --    < > =  values in this interval not displayed.     CBC: Recent Labs  Lab 09/19/19 1125 09/19/19 1130 09/19/19 1131 09/20/19 1113 09/21/19 0421 09/22/19 0422 09/24/19 0445  WBC CANCELED 9.8 CANCELED 10.1 9.4  --   --   NEUTROABS  --  6,958  --  7.0  --   --   --   HGB  --  12.1*  --  11.7* 9.7* 10.3* 8.9*  HCT  --  36.8*  --  33.8* 28.1*  --   --   MCV  --  93.6  --  89.2 89.8  --   --   PLT  --  152  --  153 142*  --   --       Lab Results  Component Value Date   HEPBSAG NON REACTIVE 09/20/2019   HEPBSAB NON REACTIVE 09/20/2019   HEPBIGM NON REACTIVE 09/20/2019      Microbiology:  Recent Results (from the past 240 hour(s))  Respiratory Panel by RT PCR (Flu A&B, Covid) - Nasopharyngeal Swab     Status: None   Collection Time:  09/20/19 11:13 AM   Specimen: Nasopharyngeal Swab  Result Value Ref Range Status   SARS Coronavirus 2 by RT PCR NEGATIVE NEGATIVE Final    Comment: (NOTE) SARS-CoV-2 target nucleic acids are NOT DETECTED. The SARS-CoV-2 RNA is generally detectable in upper respiratoy specimens during the acute phase of infection. The lowest concentration of SARS-CoV-2 viral copies this assay can detect is 131 copies/mL. A negative result does not preclude SARS-Cov-2 infection and should not be used as the sole basis for treatment or other patient management decisions. A negative result may occur with  improper specimen collection/handling, submission of specimen other than nasopharyngeal swab, presence of viral mutation(s) within the areas targeted by this assay, and inadequate number of viral copies (<131 copies/mL). A negative result must be combined with clinical observations, patient history, and epidemiological information. The expected result is Negative. Fact Sheet for Patients:  PinkCheek.be Fact Sheet for Healthcare Providers:  GravelBags.it This test is not yet ap proved or cleared by the [place] FDA and  has been authorized for detection and/or diagnosis of SARS-CoV-2 by FDA under an Emergency Use Authorization (EUA). This EUA will remain  in effect (meaning this test can be used) for the duration of the COVID-19 declaration under Section 564(b)(1) of the Act, 21 U.S.C. section 360bbb-3(b)(1), unless the authorization is terminated or revoked sooner.    Influenza A by PCR NEGATIVE NEGATIVE Final   Influenza B by PCR NEGATIVE NEGATIVE Final    Comment: (NOTE) The Xpert Xpress SARS-CoV-2/FLU/RSV assay is intended as an aid in  the diagnosis of influenza from Nasopharyngeal swab specimens and  should not be used as a sole basis for treatment. Nasal washings and  aspirates are unacceptable for Xpert Xpress SARS-CoV-2/FLU/RSV  testing. Fact Sheet for Patients: PinkCheek.be Fact Sheet for Healthcare Providers: GravelBags.it This test is not yet approved or cleared by the [place] FDA and  has been authorized for detection and/or diagnosis of SARS-CoV-2 by  FDA under an Emergency Use Authorization (EUA). This EUA will remain  in effect (meaning this test can be used) for the duration of the  Covid-19 declaration under Section 564(b)(1) of the Act, 21  U.S.C. section 360bbb-3(b)(1), unless the authorization is  terminated or revoked. Performed at [facility], [address] [place], [place] [address]     Coagulation Studies: Recent Labs    09/24/19 0445  LABPROT 13.9  INR 1.1    Urinalysis: No results for input(s): COLORURINE, LABSPEC, PHURINE, GLUCOSEU, HGBUR, BILIRUBINUR, KETONESUR, PROTEINUR, UROBILINOGEN, NITRITE, LEUKOCYTESUR in the last 72 hours.  Invalid input(s): APPERANCEUR    Imaging: US BIOPSY (KIDNEY)  Result Date: 09/24/2019 INDICATION: Acute kidney injury, proteinuria and need for random renal biopsy. EXAM: ULTRASOUND GUIDED CORE BIOPSY OF LEFT KIDNEY MEDICATIONS: None.  ANESTHESIA/SEDATION: Fentanyl 50 mcg IV; Versed 1.0 mg IV Moderate Sedation Time:  16 minutes. The patient was continuously monitored during the procedure by the interventional radiology nurse under my direct supervision. PROCEDURE: The procedure, risks, benefits, and alternatives were explained to the patient. Questions regarding the procedure were encouraged and answered. The patient understands and consents to the procedure. A time-out was performed prior to initiating the procedure. Both kidneys were examined by ultrasound in a prone position. The left flank region was prepped with chlorhexidine in a sterile fashion, and a sterile drape was applied covering the operative field. A sterile gown and sterile gloves were used for the procedure. Local anesthesia was provided with 1% Lidocaine. Under ultrasound guidance, 2 separate  core biopsy samples were obtained through lower pole cortex of the left kidney. Core biopsy samples were submitted on Telfa soaked with sterile saline. Additional ultrasound was performed. COMPLICATIONS: None immediate. FINDINGS: Both kidneys are well visualized by ultrasound. There was more distinct margination of the left renal cortex and the left kidney also demonstrated less respiratory excursion compared to the right and was chosen for biopsy. Both biopsy samples were solid and intact. IMPRESSION: Ultrasound-guided core biopsy performed at the level of lower pole cortex of the left kidney. Electronically Signed   By: Aletta Edouard M.D.   On: 09/24/2019 10:06     Medications:   . sodium chloride 50 mL/hr at 09/24/19 0749   . amLODipine  10 mg Oral Daily  . cloNIDine  0.1 mg Oral BID  . feeding supplement  1 Container Oral Q24H  . feeding supplement (ENSURE ENLIVE)  237 mL Oral BID BM  . fentaNYL      . labetalol  100 mg Oral BID  . midazolam      . multivitamin with minerals  1 tablet Oral Daily   acetaminophen, ondansetron **OR** ondansetron (ZOFRAN) IV  Assessment/  Plan:  74 y.o. [place] male with Raynaud's phenomena, AAA status post repair on 3/24, tobacco use admitted on 09/20/2019 for History of AAA (abdominal aortic aneurysm) repair [Z98.890] Acute kidney injury ([place]) [N17.9] Acute renal failure, unspecified acute renal failure type ([place]) [N17.9]  1. Acute renal failure 2. Chronic kidney disease stage IIIA: baseline creatinine of 1.28, GFR of 56 on 08/12/19;  possibly new baseline 1.66 on August 28, 2019 3. Proteinuria 4. Eosinophilia 5. Anemia with renal failure 6. AAA  6 cm.  Status post endovascular repair 3/25.  Complicated by embolization of both great toes.  Post procedure cr 1.66 on Apr 7 ; Repeat IV contrast exposure 75 cc on April 7 for CT angiogram 7.  Hypokalemia 8.  Hypertension   Differential diagnosis includes IV contrast-induced nephropathy, cholesterol embolic disease, acute interstitial nephritis, prerenal leading to ATN Work-up so far includes renal ultrasound which shows normal-sized kidney and mild bilateral increased echogenicity Proteinuria - Urine protein to creatinine ratio 3.9 gm.  0-5 RBCs Serologies -see below  Kidney biopsy has been done Hopefully will have a preliminary by Wednesday afternoon Blood pressure control is better with current regimen Liberal diet Serum creatinine, unfortunately is higher today.  There are no acute uremic symptoms.  Potassium is low. Electrolytes and volume status are acceptable.  No acute indication for dialysis at present Agree with IV fluid supplementation for the time being  Results for JEYSON, DESHOTEL (MRN [id number]) as of 09/24/2019 13:28  Ref. Range 09/20/2019 18:08  SEE BELOW Unknown Comment  Anti Nuclear Antibody (ANA) Latest Ref Range: Negative  Positive (A)  ds DNA Ab Latest Ref Range: 0 - 9 IU/mL <1  GBM Ab Latest Ref Range: 0 - 20 units 3  ENA SM Ab Ser-aCnc Latest Ref Range: 0.0 - 0.9 AI <0.2  C3 Complement Latest Ref Range: 82 - 167 mg/dL 129   Complement C4, Body Fluid Latest Ref Range: 12 - 38 mg/dL 26  Kappa free light chain Latest Ref Range: 3.3 - 19.4 mg/L 104.7 (H)  Lamda free light chains Latest Ref Range: 5.7 - 26.3 mg/L 74.4 (H)  Kappa, lamda light chain ratio Latest Ref Range: 0.26 - 1.65  1.41  Ribonucleic Protein Latest Ref Range: 0.0 - 0.9 AI 0.2  SSA (Ro) (ENA) Antibody, IgG Latest Ref Range: 0.0 -  0.9 AI <0.2  SSB (La) (ENA) Antibody, IgG Latest Ref Range: 0.0 - 0.9 AI <0.2    LOS: 4 Scott Oconnor 5/4/20211:27 PM  [place], [place]  Note: This note was prepared with Dragon dictation. Any transcription errors are unintentional

## 2019-09-25 ENCOUNTER — Inpatient Hospital Stay (HOSPITAL_COMMUNITY)
Admit: 2019-09-25 | Discharge: 2019-09-25 | Disposition: A | Discharge status: Home / Self Care | Payer: Medicare Other | Attending: Internal Medicine | Admitting: Internal Medicine

## 2019-09-25 ENCOUNTER — Inpatient Hospital Stay: Payer: Medicare Other

## 2019-09-25 ENCOUNTER — Other Ambulatory Visit (INDEPENDENT_AMBULATORY_CARE_PROVIDER_SITE_OTHER): Payer: Self-pay | Admitting: Vascular Surgery

## 2019-09-25 DIAGNOSIS — Z9889 Other specified postprocedural states: Secondary | ICD-10-CM

## 2019-09-25 DIAGNOSIS — I361 Nonrheumatic tricuspid (valve) insufficiency: Secondary | ICD-10-CM | POA: Diagnosis not present

## 2019-09-25 DIAGNOSIS — I340 Nonrheumatic mitral (valve) insufficiency: Secondary | ICD-10-CM | POA: Diagnosis not present

## 2019-09-25 DIAGNOSIS — N179 Acute kidney failure, unspecified: Secondary | ICD-10-CM

## 2019-09-25 DIAGNOSIS — D631 Anemia in chronic kidney disease: Secondary | ICD-10-CM

## 2019-09-25 DIAGNOSIS — N183 Chronic kidney disease, stage 3 unspecified: Secondary | ICD-10-CM

## 2019-09-25 LAB — BASIC METABOLIC PANEL
Anion gap: 10 (ref 5–15)
BUN: 70 mg/dL — ABNORMAL HIGH (ref 8–23)
CO2: 21 mmol/L — ABNORMAL LOW (ref 22–32)
Calcium: 8.1 mg/dL — ABNORMAL LOW (ref 8.9–10.3)
Chloride: 107 mmol/L (ref 98–111)
Creatinine, Ser: 6.71 mg/dL — ABNORMAL HIGH (ref 0.61–1.24)
GFR calc Af Amer: 9 mL/min — ABNORMAL LOW (ref >60)
GFR calc non Af Amer: 7 mL/min — ABNORMAL LOW (ref >60)
Glucose, Bld: 135 mg/dL — ABNORMAL HIGH (ref 70–99)
Potassium: 3.4 mmol/L — ABNORMAL LOW (ref 3.5–5.1)
Sodium: 138 mmol/L (ref 135–145)

## 2019-09-25 LAB — TYPE AND SCREEN
ABO/RH(D): A POS
Antibody Screen: NEGATIVE

## 2019-09-25 LAB — CBC
HCT: 28.1 % — ABNORMAL LOW (ref 39.0–52.0)
Hemoglobin: 9.5 g/dL — ABNORMAL LOW (ref 13.0–17.0)
MCH: 31.4 pg (ref 26.0–34.0)
MCHC: 33.8 g/dL (ref 30.0–36.0)
MCV: 92.7 fL (ref 80.0–100.0)
Platelets: 188 10*3/uL (ref 150–400)
RBC: 3.03 MIL/uL — ABNORMAL LOW (ref 4.22–5.81)
RDW: 17.9 % — ABNORMAL HIGH (ref 11.5–15.5)
WBC: 9.2 10*3/uL (ref 4.0–10.5)
nRBC: 0 % (ref 0.0–0.2)

## 2019-09-25 LAB — PROCALCITONIN: Procalcitonin: 0.12 ng/mL

## 2019-09-25 MED ORDER — FUROSEMIDE 10 MG/ML IJ SOLN
40.0000 mg | Freq: Once | INTRAMUSCULAR | Status: AC
  Administered 2019-09-25: 40 mg via INTRAVENOUS
  Filled 2019-09-25: qty 4

## 2019-09-25 MED ORDER — SODIUM CHLORIDE 0.9 % IV SOLN: INTRAVENOUS | Status: DC

## 2019-09-25 MED ORDER — SALINE SPRAY 0.65 % NA SOLN
1.0000 | NASAL | Status: DC | PRN
  Administered 2019-09-25: 1 via NASAL
  Filled 2019-09-25: qty 44

## 2019-09-25 MED ORDER — HYDRALAZINE HCL 50 MG PO TABS
50.0000 mg | ORAL_TABLET | Freq: Three times a day (TID) | ORAL | Status: DC
  Administered 2019-09-25 – 2019-09-27 (×8): 50 mg via ORAL
  Filled 2019-09-25 (×8): qty 1

## 2019-09-25 MED ORDER — MIRTAZAPINE 15 MG PO TABS
15.0000 mg | ORAL_TABLET | Freq: Every day | ORAL | Status: DC
  Administered 2019-09-25 – 2019-09-29 (×5): 15 mg via ORAL
  Filled 2019-09-25 (×5): qty 1

## 2019-09-25 NOTE — Progress Notes (Signed)
[facility]   Communication Note  1) Will plan on permcath insertion tomorrow with Dr. Lucky Cowboy 2) Pre-op complete  Discussed with Dr. Ellis Parents Kieffer Blatz PA-C 09/25/2019 12:27 PM

## 2019-09-25 NOTE — Progress Notes (Signed)
[place] [facility] [place] NAME: Scott Oconnor    MR#:  [id number]  DATE OF BIRTH:  09-Jul-1945  SUBJECTIVE:  reports she is not feeling well today. Very sleepy. Ate some grades this morning. Not the best appetite. Two days ago he was ambulating around per daughter. Patient not much complaining. His oxygen saturations dropped to the upper 80s and was placed on 2 L nasal cannula oxygen denies any chest pain. No significant leg pain. Poor urine output. Appears very weak REVIEW OF SYSTEMS:   Review of Systems  Constitutional: Positive for malaise/fatigue. Negative for chills, fever and weight loss.  HENT: Negative for ear discharge, ear pain and nosebleeds.   Eyes: Negative for blurred vision, pain and discharge.  Respiratory: Positive for shortness of breath. Negative for sputum production, wheezing and stridor.   Cardiovascular: Negative for chest pain, palpitations, orthopnea and PND.  Gastrointestinal: Positive for nausea. Negative for abdominal pain, diarrhea and vomiting.  Genitourinary: Negative for frequency and urgency.  Musculoskeletal: Negative for back pain and joint pain.  Neurological: Positive for weakness. Negative for sensory change, speech change and focal weakness.  Psychiatric/Behavioral: Negative for depression and hallucinations. The patient is not nervous/anxious.    Tolerating Diet:yes Tolerating PT: pt ambulatory normally but weak today  DRUG ALLERGIES:  No Known Allergies  VITALS:  Blood pressure (!) 165/71, pulse (!) 57, temperature 98.4 F (36.9 C), temperature source Oral, resp. rate 14, height 5\' 10"  (1.778 m), weight 53.5 kg, SpO2 95 %.  PHYSICAL EXAMINATION:   Physical Exam  GENERAL:  74 y.o.-year-old patient lying in the bed with no acute distress. Thin, deconditoned EYES: Pupils equal, round, reactive to light and accommodation. No scleral icterus.   HEENT: Head atraumatic, normocephalic. Oropharynx and  nasopharynx clear.  NECK:  Supple, no jugular venous distention. No thyroid enlargement, no tenderness.  LUNGS:decreased breath sounds bilaterally, no wheezing, rales, rhonchi. No use of accessory muscles of respiration.  CARDIOVASCULAR: S1, S2 normal. No murmurs, rubs, or gallops.  ABDOMEN: Soft, nontender, nondistended. Bowel sounds present. No organomegaly or mass.  EXTREMITIES: No cyanosis, clubbing or edema b/l.    NEUROLOGIC: Cranial nerves II through XII are intact. No focal Motor or sensory deficits b/l.   PSYCHIATRIC:  patient is alert and oriented x 3.  SKIN: No obvious rash, lesion, or ulcer.   LABORATORY PANEL:  CBC Recent Labs  Lab 09/25/19 0614  WBC 9.2  HGB 9.5*  HCT 28.1*  PLT 188    Chemistries  Recent Labs  Lab 09/20/19 1113 09/21/19 0421 09/25/19 0615  NA 139   < > 138  K 3.5   < > 3.4*  CL 102   < > 107  CO2 24   < > 21*  GLUCOSE 125*   < > 135*  BUN 56*   < > 70*  CREATININE 5.03*   < > 6.71*  CALCIUM 8.9   < > 8.1*  AST 30  --   --   ALT 41  --   --   ALKPHOS 156*  --   --   BILITOT 1.1  --   --    < > = values in this interval not displayed.   Cardiac Enzymes No results for input(s): TROPONINI in the last 168 hours. RADIOLOGY:  DG Chest 2 View  Result Date: 09/25/2019 CLINICAL DATA:  Hypoxia and cough.  Rule out TB. EXAM: CHEST - 2 VIEW COMPARISON:  None. FINDINGS: Normal heart  size. There are moderate bilateral pleural effusions identified, left greater than right. Mild diffuse interstitial edema. Air space opacity within the right base is identified concerning for possible pneumonia. Visualized osseous structures are unremarkable. IMPRESSION: 1. Right lower lobe airspace disease concerning for pneumonia. 2. Suspect mild CHF with bilateral pleural effusions and interstitial edema. Electronically Signed   By: Kerby Moors M.D.   On: 09/25/2019 10:42   US BIOPSY (KIDNEY)  Result Date: 09/24/2019 INDICATION: Acute kidney injury, proteinuria and  need for random renal biopsy. EXAM: ULTRASOUND GUIDED CORE BIOPSY OF LEFT KIDNEY MEDICATIONS: None. ANESTHESIA/SEDATION: Fentanyl 50 mcg IV; Versed 1.0 mg IV Moderate Sedation Time:  16 minutes. The patient was continuously monitored during the procedure by the interventional radiology nurse under my direct supervision. PROCEDURE: The procedure, risks, benefits, and alternatives were explained to the patient. Questions regarding the procedure were encouraged and answered. The patient understands and consents to the procedure. A time-out was performed prior to initiating the procedure. Both kidneys were examined by ultrasound in a prone position. The left flank region was prepped with chlorhexidine in a sterile fashion, and a sterile drape was applied covering the operative field. A sterile gown and sterile gloves were used for the procedure. Local anesthesia was provided with 1% Lidocaine. Under ultrasound guidance, 2 separate core biopsy samples were obtained through lower pole cortex of the left kidney. Core biopsy samples were submitted on Telfa soaked with sterile saline. Additional ultrasound was performed. COMPLICATIONS: None immediate. FINDINGS: Both kidneys are well visualized by ultrasound. There was more distinct margination of the left renal cortex and the left kidney also demonstrated less respiratory excursion compared to the right and was chosen for biopsy. Both biopsy samples were solid and intact. IMPRESSION: Ultrasound-guided core biopsy performed at the level of lower pole cortex of the left kidney. Electronically Signed   By: Aletta Edouard M.D.   On: 09/24/2019 10:06   ASSESSMENT AND PLAN:  Scott Oconnor  is a 74 y.o. male presenting to the hospital with some swelling in his feet and hands.  He is also been fatigued and nauseous.  He falls asleep easily.  He had a CT scan with contrast on April 7 and has not been feeling well since He had a recent AAA surgery on March 24.  Patient also  having 10 to 15 pound weight loss and poor appetite  1. Acute on chronic kidney disease stage IIIa -baseline creatinine 1.28 on 08/12/19 -patient recently got AAA repair and had a CT abdomen with IV contrast on April 7 -creatinine since then trending up-- today 6.07 -urine output poor -d/d IV contrast induced nephropathy, cholesterol embolic disease, acute interstitial nephritis leading to ATN -status post renal biopsy on 09/24/2019-- pending results -per Dr. [name] nephrology recommends perm cath placement and starting dialysis -discontinued clonidine per Dr. [name]  2. Acute hypoxic respiratory failure secondary to mild pulmonary edema right lower lobe with COPD (noted on CXR) and ongoing tobacco abuse -no evidence of infection -CXR s/o fluid more than infeciton -white count normal, no fever, Pro calcitonin 0.12 -IV Lasix 40 mg times one-- discussed with nephrology -oxygen as needed  3. Hypertension-- uncontrolled -started on hydralazine -discontinued clonidine given increased fatigue ability, mild bradycardia -continue labetalol -echo today  4. History of AAA repair recent and PVD -followed by Dr Delana Meyer  5. Anemia due to chronic kidney disease -hemoglobin stable   6. Mild hypokalemia    procedures: Family communication : discussed with dter Tanya in the room  Consults : nephrology CODE STATUS: FULL DVT Prophylaxis :SCD  Status is: Inpatient  Remains inpatient appropriate because: plans for starting hemodialysis after placement of perm cath per nephrology.   Dispo: The patient is from: Home              Anticipated d/c is to: Home              Anticipated d/c date is: > 3 days              Patient currently is not medically stable to d/c.         TOTAL TIME TAKING CARE OF THIS PATIENT: 35 minutes.  >50% time spent on counselling and coordination of care  Note: This dictation was prepared with Dragon dictation along with smaller phrase technology. Any  transcriptional errors that result from this process are unintentional.  Fritzi Mandes M.D    [facility]   CC: Primary care physician; Murlean Iba, MDPatient ID: Scott Oconnor, male   DOB: 1945/06/22, 74 y.o.   MRN: [id number]

## 2019-09-25 NOTE — Progress Notes (Signed)
Pt oxygen sat was 89. It was then rechecked and was at 86. 2L via [place] was administered. Oxygen currently at 93.

## 2019-09-25 NOTE — Progress Notes (Signed)
[place], [place] 09/25/19  Subjective:   Hospital day # 5 Patient's daughter reports that he is feeling poorly today.  He is very sleepy Patient reports that his appetite is not as good today No complaints of overt nausea but not eating as much He is able to drink fluids and water His oxygen saturation was noted to be low at eighty-nine this morning therefore he was placed on 2 L nasal cannula which made him feel better No significant leg edema Able to void without any problem     Objective:  Vital signs in last 24 hours:  Temp:  [97.5 F (36.4 C)-99.5 F (37.5 C)] 98.1 F (36.7 C) (05/05 1028) Pulse Rate:  [56-78] 61 (05/05 1028) Resp:  [16-18] 18 (05/05 1028) BP: (136-181)/(62-86) 171/66 (05/05 1028) SpO2:  [89 %-96 %] 96 % (05/05 1028)  Weight change:  Filed Weights   09/20/19 1055  Weight: 53.5 kg    Intake/Output:    Intake/Output Summary (Last 24 hours) at 09/25/2019 1159 Last data filed at 09/25/2019 1000 Gross per 24 hour  Intake 1870.86 ml  Output 325 ml  Net 1545.86 ml    Gen:   Alert, cooperative, no distress, appears stated age Head:   Normocephalic, without obvious abnormality, atraumatic Eyes/ENT:  Conjunctiva clear,  moist oral mucus membranes Neck:  Supple,  thyroid: not enlarged, no JVD Lungs:   Clear to auscultation bilaterally, respirations unlabored Heart:   Regular rate and rhythm,  Abdomen:   Soft, non-tender,  Extremities: trace edema bilaterally Skin:  Skin color, texture, turgor normal, no rashes or lesions Neurologic: Alert, able to follow commands      Basic Metabolic Panel:  Recent Labs  Lab 09/22/19 0422 09/22/19 0422 09/23/19 0448 09/23/19 0448 09/23/19 1801 09/24/19 0445 09/25/19 0615  NA 139  --  137  --  137 137 138  K 3.4*  --  3.2*  --  3.4* 3.1* 3.4*  CL 107  --  107  --  106 107 107  CO2 24  --  21*  --  20* 22 21*  GLUCOSE 141*  --  143*  --  168* 144* 135*  BUN 57*  --  61*   --  62* 66* 70*  CREATININE 5.36*  --  5.69*  --  5.85* 6.23* 6.71*  CALCIUM 8.6*   < > 8.2*   < > 8.3* 8.4* 8.1*  PHOS  --   --   --   --  4.6  --   --    < > = values in this interval not displayed.     CBC: Recent Labs  Lab 09/19/19 1130 09/19/19 1130 09/19/19 1131 09/20/19 1113 09/20/19 1113 09/21/19 0421 09/22/19 0422 09/24/19 0445 09/24/19 1707 09/25/19 0614  WBC 9.8  --  CANCELED 10.1  --  9.4  --   --   --  9.2  NEUTROABS 6,958  --   --  7.0  --   --   --   --   --   --   HGB 12.1*   < >  --  11.7*   < > 9.7* 10.3* 8.9* 9.7* 9.5*  HCT 36.8*  --   --  33.8*  --  28.1*  --   --   --  28.1*  MCV 93.6  --   --  89.2  --  89.8  --   --   --  92.7  PLT 152  --   --  153  --  142*  --   --   --  188   < > = values in this interval not displayed.      Lab Results  Component Value Date   HEPBSAG NON REACTIVE 09/20/2019   HEPBSAB NON REACTIVE 09/20/2019   HEPBIGM NON REACTIVE 09/20/2019      Microbiology:  Recent Results (from the past 240 hour(s))  Respiratory Panel by RT PCR (Flu A&B, Covid) - Nasopharyngeal Swab     Status: None   Collection Time: 09/20/19 11:13 AM   Specimen: Nasopharyngeal Swab  Result Value Ref Range Status   SARS Coronavirus 2 by RT PCR NEGATIVE NEGATIVE Final    Comment: (NOTE) SARS-CoV-2 target nucleic acids are NOT DETECTED. The SARS-CoV-2 RNA is generally detectable in upper respiratoy specimens during the acute phase of infection. The lowest concentration of SARS-CoV-2 viral copies this assay can detect is 131 copies/mL. A negative result does not preclude SARS-Cov-2 infection and should not be used as the sole basis for treatment or other patient management decisions. A negative result may occur with  improper specimen collection/handling, submission of specimen other than nasopharyngeal swab, presence of viral mutation(s) within the areas targeted by this assay, and inadequate number of viral copies (<131 copies/mL). A negative  result must be combined with clinical observations, patient history, and epidemiological information. The expected result is Negative. Fact Sheet for Patients:  PinkCheek.be Fact Sheet for Healthcare Providers:  GravelBags.it This test is not yet ap proved or cleared by the [place] FDA and  has been authorized for detection and/or diagnosis of SARS-CoV-2 by FDA under an Emergency Use Authorization (EUA). This EUA will remain  in effect (meaning this test can be used) for the duration of the COVID-19 declaration under Section 564(b)(1) of the Act, 21 U.S.C. section 360bbb-3(b)(1), unless the authorization is terminated or revoked sooner.    Influenza A by PCR NEGATIVE NEGATIVE Final   Influenza B by PCR NEGATIVE NEGATIVE Final    Comment: (NOTE) The Xpert Xpress SARS-CoV-2/FLU/RSV assay is intended as an aid in  the diagnosis of influenza from Nasopharyngeal swab specimens and  should not be used as a sole basis for treatment. Nasal washings and  aspirates are unacceptable for Xpert Xpress SARS-CoV-2/FLU/RSV  testing. Fact Sheet for Patients: PinkCheek.be Fact Sheet for Healthcare Providers: GravelBags.it This test is not yet approved or cleared by the [place] FDA and  has been authorized for detection and/or diagnosis of SARS-CoV-2 by  FDA under an Emergency Use Authorization (EUA). This EUA will remain  in effect (meaning this test can be used) for the duration of the  Covid-19 declaration under Section 564(b)(1) of the Act, 21  U.S.C. section 360bbb-3(b)(1), unless the authorization is  terminated or revoked. Performed at [facility], [address] [place], [place] [address]     Coagulation Studies: Recent Labs    09/24/19 0445  LABPROT 13.9  INR 1.1    Urinalysis: No results for input(s): COLORURINE, LABSPEC, PHURINE, GLUCOSEU,  HGBUR, BILIRUBINUR, KETONESUR, PROTEINUR, UROBILINOGEN, NITRITE, LEUKOCYTESUR in the last 72 hours.  Invalid input(s): APPERANCEUR    Imaging: DG Chest 2 View  Result Date: 09/25/2019 CLINICAL DATA:  Hypoxia and cough.  Rule out TB. EXAM: CHEST - 2 VIEW COMPARISON:  None. FINDINGS: Normal heart size. There are moderate bilateral pleural effusions identified, left greater than right. Mild diffuse interstitial edema. Air space opacity within the right base is identified concerning for possible pneumonia. Visualized osseous structures  are unremarkable. IMPRESSION: 1. Right lower lobe airspace disease concerning for pneumonia. 2. Suspect mild CHF with bilateral pleural effusions and interstitial edema. Electronically Signed   By: Kerby Moors M.D.   On: 09/25/2019 10:42   US BIOPSY (KIDNEY)  Result Date: 09/24/2019 INDICATION: Acute kidney injury, proteinuria and need for random renal biopsy. EXAM: ULTRASOUND GUIDED CORE BIOPSY OF LEFT KIDNEY MEDICATIONS: None. ANESTHESIA/SEDATION: Fentanyl 50 mcg IV; Versed 1.0 mg IV Moderate Sedation Time:  16 minutes. The patient was continuously monitored during the procedure by the interventional radiology nurse under my direct supervision. PROCEDURE: The procedure, risks, benefits, and alternatives were explained to the patient. Questions regarding the procedure were encouraged and answered. The patient understands and consents to the procedure. A time-out was performed prior to initiating the procedure. Both kidneys were examined by ultrasound in a prone position. The left flank region was prepped with chlorhexidine in a sterile fashion, and a sterile drape was applied covering the operative field. A sterile gown and sterile gloves were used for the procedure. Local anesthesia was provided with 1% Lidocaine. Under ultrasound guidance, 2 separate core biopsy samples were obtained through lower pole cortex of the left kidney. Core biopsy samples were submitted on Telfa  soaked with sterile saline. Additional ultrasound was performed. COMPLICATIONS: None immediate. FINDINGS: Both kidneys are well visualized by ultrasound. There was more distinct margination of the left renal cortex and the left kidney also demonstrated less respiratory excursion compared to the right and was chosen for biopsy. Both biopsy samples were solid and intact. IMPRESSION: Ultrasound-guided core biopsy performed at the level of lower pole cortex of the left kidney. Electronically Signed   By: Aletta Edouard M.D.   On: 09/24/2019 10:06     Medications:    . amLODipine  10 mg Oral Daily  . feeding supplement  1 Container Oral Q24H  . feeding supplement (ENSURE ENLIVE)  237 mL Oral BID BM  . hydrALAZINE  50 mg Oral TID  . labetalol  100 mg Oral BID  . multivitamin with minerals  1 tablet Oral Daily   acetaminophen, ondansetron **OR** ondansetron (ZOFRAN) IV  Assessment/ Plan:  74 y.o. 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 Acute renal failure 2. Chronic kidney disease stage IIIA: baseline creatinine of 1.28, GFR of 56 on 08/12/19;  possibly new baseline 1.66 on August 28, 2019 3. Proteinuria 4. Eosinophilia 5. Anemia with renal failure 6. AAA  6 cm.  Status post endovascular repair 3/25.  Complicated by embolization of both great toes.  Post procedure cr 1.66 on Apr 7 ; Repeat IV contrast exposure 75 cc on April 7 for CT angiogram 7.  Hypokalemia 8.  Hypertension   Differential diagnosis includes IV contrast-induced nephropathy, cholesterol embolic disease, acute interstitial nephritis, prerenal leading to ATN Work-up so far includes renal ultrasound which shows normal-sized kidney and mild bilateral increased  echogenicity Proteinuria - Urine protein to creatinine ratio 3.9 gm.  0-5 RBCs Serologies -see below  Kidney biopsy has been done on May 4.  Results awaited Blood pressure control is higher today.  We have discontinued clonidine per patient request in case it is contributing to tiredness/fatigue.  Added hydralazine 50 mg three times per day Liberal diet Serum creatinine, unfortunately is higher today.  Fatigue/tiredness/decreased  appetite may be mild uremic symptoms If no improvement in uremic symptoms today, plan on PermCath tomorrow and starting on dialysis for AKI Further recommendations based on results of the kidney biopsy   Results for CURRIE, DENNIN (MRN [id number]) as of 09/24/2019 13:28  Ref. Range 09/20/2019 18:08  SEE BELOW Unknown Comment  Anti Nuclear Antibody (ANA) Latest Ref Range: Negative  Positive (A)  ds DNA Ab Latest Ref Range: 0 - 9 IU/mL <1  GBM Ab Latest Ref Range: 0 - 20 units 3  ENA SM Ab Ser-aCnc Latest Ref Range: 0.0 - 0.9 AI <0.2  C3 Complement Latest Ref Range: 82 - 167 mg/dL 129  Complement C4, Body Fluid Latest Ref Range: 12 - 38 mg/dL 26  Kappa free light chain Latest Ref Range: 3.3 - 19.4 mg/L 104.7 (H)  Lamda free light chains Latest Ref Range: 5.7 - 26.3 mg/L 74.4 (H)  Kappa, lamda light chain ratio Latest Ref Range: 0.26 - 1.65  1.41  Ribonucleic Protein Latest Ref Range: 0.0 - 0.9 AI 0.2  SSA (Ro) (ENA) Antibody, IgG Latest Ref Range: 0.0 - 0.9 AI <0.2  SSB (La) (ENA) Antibody, IgG Latest Ref Range: 0.0 - 0.9 AI <0.2    LOS: [place] 5/5/202111:59 AM  [facility] [place], [place]  Note: This note was prepared with Dragon dictation. Any transcription errors are unintentional

## 2019-09-26 ENCOUNTER — Encounter
Admission: EM | Disposition: A | Discharge status: Home / Self Care | Payer: Self-pay | Source: Home / Self Care | Attending: Internal Medicine

## 2019-09-26 ENCOUNTER — Encounter: Payer: Self-pay | Admitting: Internal Medicine

## 2019-09-26 DIAGNOSIS — Z992 Dependence on renal dialysis: Secondary | ICD-10-CM

## 2019-09-26 DIAGNOSIS — N186 End stage renal disease: Secondary | ICD-10-CM

## 2019-09-26 HISTORY — PX: DIALYSIS/PERMA CATHETER INSERTION: CATH118288

## 2019-09-26 LAB — MPO/PR-3 (ANCA) ANTIBODIES
ANCA Proteinase 3: <3.5 U/mL (ref 0.0–3.5)
Myeloperoxidase Abs: <9 U/mL (ref 0.0–9.0)

## 2019-09-26 LAB — BASIC METABOLIC PANEL
Anion gap: 11 (ref 5–15)
BUN: 64 mg/dL — ABNORMAL HIGH (ref 8–23)
CO2: 18 mmol/L — ABNORMAL LOW (ref 22–32)
Calcium: 8.2 mg/dL — ABNORMAL LOW (ref 8.9–10.3)
Chloride: 106 mmol/L (ref 98–111)
Creatinine, Ser: 6.6 mg/dL — ABNORMAL HIGH (ref 0.61–1.24)
GFR calc Af Amer: 9 mL/min — ABNORMAL LOW (ref >60)
GFR calc non Af Amer: 8 mL/min — ABNORMAL LOW (ref >60)
Glucose, Bld: 139 mg/dL — ABNORMAL HIGH (ref 70–99)
Potassium: 3.3 mmol/L — ABNORMAL LOW (ref 3.5–5.1)
Sodium: 135 mmol/L (ref 135–145)

## 2019-09-26 LAB — ECHOCARDIOGRAM COMPLETE
Height: 70 in
Weight: 1888 oz

## 2019-09-26 SURGERY — DIALYSIS/PERMA CATHETER INSERTION
Anesthesia: Choice

## 2019-09-26 MED ORDER — METHYLPREDNISOLONE SODIUM SUCC 125 MG IJ SOLR: 125.0000 mg | Freq: Once | INTRAMUSCULAR | Status: DC | PRN

## 2019-09-26 MED ORDER — LABETALOL HCL 5 MG/ML IV SOLN
20.0000 mg | INTRAVENOUS | Status: DC | PRN
  Administered 2019-09-26: 20 mg via INTRAVENOUS
  Filled 2019-09-26: qty 4

## 2019-09-26 MED ORDER — CEFAZOLIN SODIUM-DEXTROSE 1-4 GM/50ML-% IV SOLN
INTRAVENOUS | Status: AC
  Filled 2019-09-26: qty 50

## 2019-09-26 MED ORDER — FENTANYL CITRATE (PF) 100 MCG/2ML IJ SOLN
INTRAMUSCULAR | Status: DC | PRN
  Administered 2019-09-26: 25 ug via INTRAVENOUS

## 2019-09-26 MED ORDER — ACETAMINOPHEN 650 MG RE SUPP: 650.0000 mg | Freq: Four times a day (QID) | RECTAL | Status: DC | PRN

## 2019-09-26 MED ORDER — ONDANSETRON HCL 4 MG/2ML IJ SOLN
4.0000 mg | Freq: Four times a day (QID) | INTRAMUSCULAR | Status: DC | PRN
  Administered 2019-09-28: 4 mg via INTRAVENOUS

## 2019-09-26 MED ORDER — POTASSIUM CHLORIDE CRYS ER 20 MEQ PO TBCR
40.0000 meq | EXTENDED_RELEASE_TABLET | Freq: Once | ORAL | Status: AC
  Administered 2019-09-26: 40 meq via ORAL
  Filled 2019-09-26: qty 2

## 2019-09-26 MED ORDER — HYDROMORPHONE HCL 1 MG/ML IJ SOLN: 1.0000 mg | Freq: Once | INTRAMUSCULAR | Status: DC | PRN

## 2019-09-26 MED ORDER — CEFAZOLIN SODIUM-DEXTROSE 1-4 GM/50ML-% IV SOLN
1.0000 g | Freq: Once | INTRAVENOUS | Status: AC
  Administered 2019-09-26: 1 g via INTRAVENOUS
  Filled 2019-09-26: qty 50

## 2019-09-26 MED ORDER — MIDAZOLAM HCL 2 MG/ML PO SYRP
8.0000 mg | ORAL_SOLUTION | Freq: Once | ORAL | Status: DC | PRN
  Filled 2019-09-26: qty 4

## 2019-09-26 MED ORDER — ACETAMINOPHEN 500 MG PO TABS
500.0000 mg | ORAL_TABLET | Freq: Once | ORAL | Status: AC
  Administered 2019-09-26: 500 mg via ORAL
  Filled 2019-09-26: qty 1

## 2019-09-26 MED ORDER — CHLORHEXIDINE GLUCONATE CLOTH 2 % EX PADS
6.0000 | MEDICATED_PAD | Freq: Every day | CUTANEOUS | Status: DC
  Administered 2019-09-26 – 2019-09-30 (×5): 6 via TOPICAL

## 2019-09-26 MED ORDER — DIPHENHYDRAMINE HCL 50 MG/ML IJ SOLN: 50.0000 mg | Freq: Once | INTRAMUSCULAR | Status: DC | PRN

## 2019-09-26 MED ORDER — HEPARIN SODIUM (PORCINE) 10000 UNIT/ML IJ SOLN
INTRAMUSCULAR | Status: AC
  Filled 2019-09-26: qty 1

## 2019-09-26 MED ORDER — ACETAMINOPHEN 500 MG PO TABS
1000.0000 mg | ORAL_TABLET | Freq: Three times a day (TID) | ORAL | Status: DC | PRN
  Administered 2019-09-27 (×3): 1000 mg via ORAL
  Filled 2019-09-26 (×3): qty 2

## 2019-09-26 MED ORDER — FENTANYL CITRATE (PF) 100 MCG/2ML IJ SOLN
INTRAMUSCULAR | Status: AC
  Filled 2019-09-26: qty 2

## 2019-09-26 MED ORDER — MIDAZOLAM HCL 5 MG/5ML IJ SOLN
INTRAMUSCULAR | Status: AC
  Filled 2019-09-26: qty 5

## 2019-09-26 MED ORDER — MIDAZOLAM HCL 2 MG/2ML IJ SOLN
INTRAMUSCULAR | Status: DC | PRN
  Administered 2019-09-26: 1 mg via INTRAVENOUS

## 2019-09-26 MED ORDER — FAMOTIDINE 20 MG PO TABS: 40.0000 mg | ORAL_TABLET | Freq: Once | ORAL | Status: DC | PRN

## 2019-09-26 SURGICAL SUPPLY — 10 items
CATH CANNON HEMO 15FR 19 (HEMODIALYSIS SUPPLIES) ×2 IMPLANT
COVER PROBE U/S 5X48 (MISCELLANEOUS) ×2 IMPLANT
DERMABOND ADVANCED (GAUZE/BANDAGES/DRESSINGS) ×2
DERMABOND ADVANCED .7 DNX12 (GAUZE/BANDAGES/DRESSINGS) IMPLANT
PACK ANGIOGRAPHY (CUSTOM PROCEDURE TRAY) ×2 IMPLANT
SUT MNCRL 4-0 (SUTURE) ×2
SUT MNCRL 4-0 27XMFL (SUTURE) ×1
SUT PROLENE 0 CT 1 30 (SUTURE) ×2 IMPLANT
SUTURE MNCRL 4-0 27XMF (SUTURE) IMPLANT
TOWEL OR 17X26 4PK STRL BLUE (TOWEL DISPOSABLE) ×2 IMPLANT

## 2019-09-26 NOTE — Progress Notes (Signed)
[facility], [place] 09/26/19  Subjective:   Hospital day # 6 More alert today He was able to eat half of his lunch this afternoon Trace leg edema No shortness of breath Requiring 1 L oxygen nasal cannula Got PermCath earlier today     Objective:  Vital signs in last 24 hours:  Temp:  [97.8 F (36.6 C)-100 F (37.8 C)] 98.6 F (37 C) (05/06 1312) Pulse Rate:  [77-99] 84 (05/06 1312) Resp:  [15-20] 16 (05/06 1312) BP: (162-192)/(62-79) 184/79 (05/06 1312) SpO2:  [92 %-97 %] 94 % (05/06 1312) Weight:  [53.5 kg] 53.5 kg (05/06 1035)  Weight change:  Filed Weights   09/20/19 1055 09/26/19 1035  Weight: 53.5 kg 53.5 kg    Intake/Output:    Intake/Output Summary (Last 24 hours) at 09/26/2019 1409 Last data filed at 09/26/2019 0100 Gross per 24 hour  Intake 0 ml  Output 1150 ml  Net -1150 ml    Gen:   Alert, cooperative, no distress, appears stated age Head:   Normocephalic, without obvious abnormality, atraumatic Eyes/ENT:  Conjunctiva clear,  moist oral mucus membranes Neck:  Supple,  thyroid: not enlarged, no JVD Lungs:   Clear to auscultation bilaterally, respirations unlabored Heart:   Regular rate and rhythm,  Abdomen:   Soft, non-tender,  Extremities: trace to 1+ edema bilaterally Skin:  Skin color, texture, turgor normal, no rashes or lesions Neurologic: Alert, able to follow commands Right IJ PermCath placement 11/10/2019     Basic Metabolic Panel:  Recent Labs  Lab 09/23/19 0448 09/23/19 0448 09/23/19 1801 09/23/19 1801 09/24/19 0445 09/25/19 0615 09/26/19 0420  NA 137  --  137  --  137 138 135  K 3.2*  --  3.4*  --  3.1* 3.4* 3.3*  CL 107  --  106  --  107 107 106  CO2 21*  --  20*  --  22 21* 18*  GLUCOSE 143*  --  168*  --  144* 135* 139*  BUN 61*  --  62*  --  66* 70* 64*  CREATININE 5.69*  --  5.85*  --  6.23* 6.71* 6.60*  CALCIUM 8.2*   < > 8.3*   < > 8.4* 8.1* 8.2*  PHOS  --   --  4.6  --   --   --   --     < > = values in this interval not displayed.     CBC: Recent Labs  Lab 09/20/19 1113 09/20/19 1113 09/21/19 0421 09/22/19 0422 09/24/19 0445 09/24/19 1707 09/25/19 0614  WBC 10.1  --  9.4  --   --   --  9.2  NEUTROABS 7.0  --   --   --   --   --   --   HGB 11.7*   < > 9.7* 10.3* 8.9* 9.7* 9.5*  HCT 33.8*  --  28.1*  --   --   --  28.1*  MCV 89.2  --  89.8  --   --   --  92.7  PLT 153  --  142*  --   --   --  188   < > = values in this interval not displayed.      Lab Results  Component Value Date   HEPBSAG NON REACTIVE 09/20/2019   HEPBSAB NON REACTIVE 09/20/2019   HEPBIGM NON REACTIVE 09/20/2019      Microbiology:  Recent Results (from the past 240 hour(s))  Respiratory Panel  by RT PCR (Flu A&B, Covid) - Nasopharyngeal Swab     Status: None   Collection Time: 09/20/19 11:13 AM   Specimen: Nasopharyngeal Swab  Result Value Ref Range Status   SARS Coronavirus 2 by RT PCR NEGATIVE NEGATIVE Final    Comment: (NOTE) SARS-CoV-2 target nucleic acids are NOT DETECTED. The SARS-CoV-2 RNA is generally detectable in upper respiratoy specimens during the acute phase of infection. The lowest concentration of SARS-CoV-2 viral copies this assay can detect is 131 copies/mL. A negative result does not preclude SARS-Cov-2 infection and should not be used as the sole basis for treatment or other patient management decisions. A negative result may occur with  improper specimen collection/handling, submission of specimen other than nasopharyngeal swab, presence of viral mutation(s) within the areas targeted by this assay, and inadequate number of viral copies (<131 copies/mL). A negative result must be combined with clinical observations, patient history, and epidemiological information. The expected result is Negative. Fact Sheet for Patients:  PinkCheek.be Fact Sheet for Healthcare Providers:  GravelBags.it This test is  not yet ap proved or cleared by the [place] FDA and  has been authorized for detection and/or diagnosis of SARS-CoV-2 by FDA under an Emergency Use Authorization (EUA). This EUA will remain  in effect (meaning this test can be used) for the duration of the COVID-19 declaration under Section 564(b)(1) of the Act, 21 U.S.C. section 360bbb-3(b)(1), unless the authorization is terminated or revoked sooner.    Influenza A by PCR NEGATIVE NEGATIVE Final   Influenza B by PCR NEGATIVE NEGATIVE Final    Comment: (NOTE) The Xpert Xpress SARS-CoV-2/FLU/RSV assay is intended as an aid in  the diagnosis of influenza from Nasopharyngeal swab specimens and  should not be used as a sole basis for treatment. Nasal washings and  aspirates are unacceptable for Xpert Xpress SARS-CoV-2/FLU/RSV  testing. Fact Sheet for Patients: PinkCheek.be Fact Sheet for Healthcare Providers: GravelBags.it This test is not yet approved or cleared by the [place] FDA and  has been authorized for detection and/or diagnosis of SARS-CoV-2 by  FDA under an Emergency Use Authorization (EUA). This EUA will remain  in effect (meaning this test can be used) for the duration of the  Covid-19 declaration under Section 564(b)(1) of the Act, 21  U.S.C. section 360bbb-3(b)(1), unless the authorization is  terminated or revoked. Performed at [facility], [address] [place], [place] [address]     Coagulation Studies: Recent Labs    09/24/19 0445  LABPROT 13.9  INR 1.1    Urinalysis: No results for input(s): COLORURINE, LABSPEC, PHURINE, GLUCOSEU, HGBUR, BILIRUBINUR, KETONESUR, PROTEINUR, UROBILINOGEN, NITRITE, LEUKOCYTESUR in the last 72 hours.  Invalid input(s): APPERANCEUR    Imaging: DG Chest 2 View  Result Date: 09/25/2019 CLINICAL DATA:  Hypoxia and cough.  Rule out TB. EXAM: CHEST - 2 VIEW COMPARISON:  None. FINDINGS: Normal heart  size. There are moderate bilateral pleural effusions identified, left greater than right. Mild diffuse interstitial edema. Air space opacity within the right base is identified concerning for possible pneumonia. Visualized osseous structures are unremarkable. IMPRESSION: 1. Right lower lobe airspace disease concerning for pneumonia. 2. Suspect mild CHF with bilateral pleural effusions and interstitial edema. Electronically Signed   By: Kerby Moors M.D.   On: 09/25/2019 10:42   PERIPHERAL VASCULAR CATHETERIZATION  Result Date: 09/26/2019 See op note    Medications:   . ceFAZolin     . amLODipine  10 mg Oral Daily  . Chlorhexidine Gluconate  Cloth  6 each Topical Daily  . feeding supplement  1 Container Oral Q24H  . feeding supplement (ENSURE ENLIVE)  237 mL Oral BID BM  . fentaNYL      . heparin      . hydrALAZINE  50 mg Oral TID  . labetalol  100 mg Oral BID  . midazolam      . mirtazapine  15 mg Oral QHS  . multivitamin with minerals  1 tablet Oral Daily   acetaminophen, acetaminophen, HYDROmorphone (DILAUDID) injection, labetalol, ondansetron **OR** ondansetron (ZOFRAN) IV, ondansetron (ZOFRAN) IV, sodium chloride  Assessment/ Plan:  74 y.o. 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 Acute renal failure 2. Chronic kidney disease stage IIIA: baseline creatinine of 1.28, GFR of 56 on 08/12/19;  possibly new baseline 1.66 on August 28, 2019 3. Proteinuria 4. Eosinophilia 5. Anemia with renal failure 6. AAA  6 cm.  Status post endovascular repair 3/25.  Complicated by embolization of both great toes.  Post procedure cr 1.66 on Apr 7 ; Repeat IV contrast exposure 75 cc on April 7 for CT angiogram 7.  Hypokalemia 8.  Hypertension    Preliminary results of kidney biopsy show cholesterol embolic disease and ATN Today's creatinine is lower with urine output of 1700 cc It is possible renal recovery has started but due to mild uremic symptoms, we elected to proceed with PermCath placement in case he needs dialysis in next few days.  He is eating a little better.  Potassium is low.  Urine output has improved No  dialysis today but will assess daily and will monitor closely We will initiate admission to outpatient dialysis unit in case he needs any treatments after his discharge for AKI.   Results for SALVATOR, SEPPALA (MRN [id number]) as of 09/24/2019 13:28  Ref. Range 09/20/2019 18:08  SEE BELOW Unknown Comment  Anti Nuclear Antibody (ANA) Latest Ref Range: Negative  Positive (A)  ds DNA Ab Latest Ref Range: 0 - 9 IU/mL <1  GBM Ab Latest Ref Range: 0 - 20 units 3  ENA SM Ab Ser-aCnc Latest Ref Range: 0.0 - 0.9 AI <0.2  C3 Complement Latest Ref Range: 82 - 167 mg/dL 129  Complement C4, Body Fluid Latest Ref Range: 12 - 38 mg/dL 26  Kappa free light chain Latest Ref Range: 3.3 - 19.4 mg/L 104.7 (H)  Lamda free light chains Latest Ref Range: 5.7 - 26.3 mg/L 74.4 (H)  Kappa, lamda light chain ratio Latest Ref Range: 0.26 - 1.65  1.41  Ribonucleic Protein Latest Ref Range: 0.0 - 0.9 AI 0.2  SSA (Ro) (ENA) Antibody, IgG Latest Ref Range: 0.0 - 0.9 AI <0.2  SSB (La) (ENA) Antibody, IgG Latest Ref Range: 0.0 - 0.9 AI <0.2    LOS: 6 Makinzy Cleere 5/6/20212:09 PM  [place], [place]  Note: This note was prepared with Dragon dictation. Any transcription errors are unintentional

## 2019-09-26 NOTE — Op Note (Signed)
OPERATIVE NOTE    PRE-OPERATIVE DIAGNOSIS: 1. Renal failure   POST-OPERATIVE DIAGNOSIS: same as above  PROCEDURE: 1. Ultrasound guidance for vascular access to the right internal jugular vein 2. Fluoroscopic guidance for placement of catheter 3. Placement of a 19 cm tip to cuff tunneled hemodialysis catheter via the right internal jugular vein  SURGEON: Leotis Pain, MD  ANESTHESIA:  Local with Moderate conscious sedation for approximately 15 minutes using 1 mg of Versed and 25 mcg of Fentanyl  ESTIMATED BLOOD LOSS: 3 cc  FLUORO TIME: less than one minute  CONTRAST: none  FINDING(S): 1.  Patent right internal jugular vein  SPECIMEN(S):  None  INDICATIONS:   Scott Oconnor is a 74 y.o.male who presents with renal failure.  The patient needs long term dialysis access for their ESRD, and a Permcath is necessary.  Risks and benefits are discussed and informed consent is obtained.    DESCRIPTION: After obtaining full informed written consent, the patient was brought back to the vascular suited. The patient's right neck and chest were sterilely prepped and draped in a sterile surgical field was created. Moderate conscious sedation was administered during a face to face encounter with the patient throughout the procedure with my supervision of the RN administering medicines and monitoring the patient's vital signs, pulse oximetry, telemetry and mental status throughout from the start of the procedure until the patient was taken to the recovery room.  The right internal jugular vein was visualized with ultrasound and found to be patent. It was then accessed under direct ultrasound guidance and a permanent image was recorded. A wire was placed. After skin nick and dilatation, the peel-away sheath was placed over the wire. I then turned my attention to an area under the clavicle. Approximately 1-2 fingerbreadths below the clavicle a small counterincision was created and tunneled from the  subclavicular incision to the access site. Using fluoroscopic guidance, a 19 centimeter tip to cuff tunneled hemodialysis catheter was selected, and tunneled from the subclavicular incision to the access site. It was then placed through the peel-away sheath and the peel-away sheath was removed. Using fluoroscopic guidance the catheter tips were parked in the right atrium. The appropriate distal connectors were placed. It withdrew blood well and flushed easily with heparinized saline and a concentrated heparin solution was then placed. It was secured to the chest wall with 2 Prolene sutures. The access incision was closed single 4-0 Monocryl. A 4-0 Monocryl pursestring suture was placed around the exit site. Sterile dressings were placed. The patient tolerated the procedure well and was taken to the recovery room in stable condition.  COMPLICATIONS: None  CONDITION: Stable  Leotis Pain, MD 09/26/2019 12:14 PM   This note was created with Dragon Medical transcription system. Any errors in dictation are purely unintentional.

## 2019-09-26 NOTE — H&P (Signed)
[facility] History & Physical Update  The patient was interviewed and re-examined.  The patient's previous History and Physical has been reviewed and is unchanged.  There is no change in the plan of care. We plan to proceed with the scheduled procedure.  Leotis Pain, MD  09/26/2019, 11:11 AM

## 2019-09-26 NOTE — Care Management Important Message (Signed)
Important Message  Patient Details  Name: Scott Oconnor MRN: [id number] Date of Birth: 1946/04/27   Medicare Important Message Given:  Yes     Juliann Pulse A Knoxx Boeding 09/26/2019, 10:19 AM

## 2019-09-26 NOTE — Progress Notes (Signed)
[facility] [place] NAME: Scott Oconnor    MR#:  [id number]  DATE OF BIRTH:  [birth year]  SUBJECTIVE:  patient in better spirits today. Daughter in the room. Feels a lot better. No shortness of breath. Just got back from getting perm cath placement. Feels hungry REVIEW OF SYSTEMS:   Review of Systems  Constitutional: Positive for malaise/fatigue. Negative for chills, fever and weight loss.  HENT: Negative for ear discharge, ear pain and nosebleeds.   Eyes: Negative for blurred vision, pain and discharge.  Respiratory: Positive for shortness of breath. Negative for sputum production, wheezing and stridor.   Cardiovascular: Negative for chest pain, palpitations, orthopnea and PND.  Gastrointestinal: Positive for nausea. Negative for abdominal pain, diarrhea and vomiting.  Genitourinary: Negative for frequency and urgency.  Musculoskeletal: Negative for back pain and joint pain.  Neurological: Positive for weakness. Negative for sensory change, speech change and focal weakness.  Psychiatric/Behavioral: Negative for depression and hallucinations. The patient is not nervous/anxious.    Tolerating Diet:yes Tolerating PT: pt ambulatory normally but weak today  DRUG ALLERGIES:  No Known Allergies  VITALS:  Blood pressure (!) 184/79, pulse 84, temperature 98.6 F (37 C), temperature source Oral, resp. rate 16, height 5\' 10"  (1.778 m), weight 53.5 kg, SpO2 94 %.  PHYSICAL EXAMINATION:   Physical Exam  GENERAL:  74 y.o.-year-old patient lying in the bed with no acute distress.  Thin, deconditoned EYES: Pupils equal, round, reactive to light and accommodation. No scleral icterus.   HEENT: Head atraumatic, normocephalic. Oropharynx and nasopharynx clear.  NECK:  Supple, no jugular venous distention. No thyroid enlargement, no tenderness.  LUNGS:decreased breath sounds bilaterally, no wheezing, rales, rhonchi. No use of accessory muscles of  respiration. Perm cath+ CARDIOVASCULAR: S1, S2 normal. No murmurs, rubs, or gallops.  ABDOMEN: Soft, nontender, nondistended. Bowel sounds present. No organomegaly or mass.  EXTREMITIES: No cyanosis, clubbing or edema b/l.    NEUROLOGIC: Cranial nerves II through XII are intact. No focal Motor or sensory deficits b/l.   PSYCHIATRIC:  patient is alert and oriented x 3.  SKIN: No obvious rash, lesion, or ulcer.   LABORATORY PANEL:  CBC Recent Labs  Lab 09/25/19 0614  WBC 9.2  HGB 9.5*  HCT 28.1*  PLT 188    Chemistries  Recent Labs  Lab 09/20/19 1113 09/21/19 0421 09/26/19 0420  NA 139   < > 135  K 3.5   < > 3.3*  CL 102   < > 106  CO2 24   < > 18*  GLUCOSE 125*   < > 139*  BUN 56*   < > 64*  CREATININE 5.03*   < > 6.60*  CALCIUM 8.9   < > 8.2*  AST 30  --   --   ALT 41  --   --   ALKPHOS 156*  --   --   BILITOT 1.1  --   --    < > = values in this interval not displayed.   Cardiac Enzymes No results for input(s): TROPONINI in the last 168 hours. RADIOLOGY:  DG Chest 2 View  Result Date: 09/25/2019 CLINICAL DATA:  Hypoxia and cough.  Rule out TB. EXAM: CHEST - 2 VIEW COMPARISON:  None. FINDINGS: Normal heart size. There are moderate bilateral pleural effusions identified, left greater than right. Mild diffuse interstitial edema. Air space opacity within the right base is identified concerning for possible pneumonia. Visualized osseous structures are unremarkable.  IMPRESSION: 1. Right lower lobe airspace disease concerning for pneumonia. 2. Suspect mild CHF with bilateral pleural effusions and interstitial edema. Electronically Signed   By: Kerby Moors M.D.   On: 09/25/2019 10:42   PERIPHERAL VASCULAR CATHETERIZATION  Result Date: 09/26/2019 See op note  ASSESSMENT AND PLAN:  Scott Oconnor  is a 74 y.o. male presenting to the hospital with some swelling in his feet and hands.  He is also been fatigued and nauseous.  He falls asleep easily.  He had a CT scan with  contrast on April 7 and has not been feeling well since He had a recent AAA surgery on March 24.  Patient also having 10 to 15 pound weight loss and poor appetite  1. 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 [name] nephrology recommends perm cath placement. Holding HD for now since pt making urine and creat a bit better -discontinued clonidine per Dr. [name]  2. Acute hypoxic respiratory failure secondary to mild pulmonary edema right lower lobe with COPD (noted on CXR) and ongoing tobacco abuse -no evidence of infection -CXR s/o fluid more than infeciton -white count normal, no fever, Pro calcitonin 0.12 -IV Lasix 40 mg times one-- discussed with nephrology -oxygen as needed--wean to room air  3. Hypertension-- uncontrolled -discontinued clonidine given increased fatigue ability, mild bradycardia -continue labetalol , hydralazine and amlodipine -echo results pending  4. History of AAA repair recent and PVD -followed by Dr Delana Meyer  5. Anemia due to chronic kidney disease -hemoglobin stable   6. Mild hypokalemia    procedures: perm cath placement Family communication : discussed with dter Scott Oconnor in the room Consults : nephrology CODE STATUS: FULL DVT Prophylaxis :SCD  Status is: Inpatient  Remains inpatient appropriate because: plans for starting hemodialysis if urine output and creatinine does not improve over 24 hours s/p placement of perm cath per nephrology.   Dispo: The patient is from:  Home              Anticipated d/c is to: Home              Anticipated d/c date is: > 3 days              Patient currently is not medically stable to d/c.         TOTAL TIME TAKING CARE OF THIS PATIENT: 30 minutes.  >50% time spent on counselling and coordination of care  Note: This dictation was prepared with Dragon dictation along with smaller phrase technology. Any transcriptional errors that result from this process are unintentional.  Fritzi Mandes M.D    [facility]   CC: Primary care physician; Murlean Iba, MDPatient ID: Scott Oconnor, male   DOB: 09/12/1945, 74 y.o.   MRN: [id number]

## 2019-09-27 DIAGNOSIS — E43 Unspecified severe protein-calorie malnutrition: Secondary | ICD-10-CM | POA: Insufficient documentation

## 2019-09-27 LAB — PROTEIN ELECTROPHORESIS, SERUM
A/G Ratio: 1.1 (ref 0.7–1.7)
Albumin ELP: 2.9 g/dL (ref 2.9–4.4)
Alpha-1-Globulin: 0.3 g/dL (ref 0.0–0.4)
Alpha-2-Globulin: 0.5 g/dL (ref 0.4–1.0)
Beta Globulin: 0.9 g/dL (ref 0.7–1.3)
Gamma Globulin: 1 g/dL (ref 0.4–1.8)
Globulin, Total: 2.7 g/dL (ref 2.2–3.9)
Total Protein ELP: 5.6 g/dL — ABNORMAL LOW (ref 6.0–8.5)

## 2019-09-27 LAB — BASIC METABOLIC PANEL
Anion gap: 13 (ref 5–15)
BUN: 73 mg/dL — ABNORMAL HIGH (ref 8–23)
CO2: 18 mmol/L — ABNORMAL LOW (ref 22–32)
Calcium: 8.6 mg/dL — ABNORMAL LOW (ref 8.9–10.3)
Chloride: 109 mmol/L (ref 98–111)
Creatinine, Ser: 7.14 mg/dL — ABNORMAL HIGH (ref 0.61–1.24)
GFR calc Af Amer: 8 mL/min — ABNORMAL LOW (ref >60)
GFR calc non Af Amer: 7 mL/min — ABNORMAL LOW (ref >60)
Glucose, Bld: 154 mg/dL — ABNORMAL HIGH (ref 70–99)
Potassium: 3.9 mmol/L (ref 3.5–5.1)
Sodium: 140 mmol/L (ref 135–145)

## 2019-09-27 MED ORDER — NEPRO/CARBSTEADY PO LIQD
237.0000 mL | Freq: Three times a day (TID) | ORAL | Status: DC
  Administered 2019-09-27 – 2019-09-29 (×3): 237 mL via ORAL

## 2019-09-27 MED ORDER — BISACODYL 5 MG PO TBEC
10.0000 mg | DELAYED_RELEASE_TABLET | Freq: Every day | ORAL | Status: DC | PRN
  Administered 2019-09-27: 10 mg via ORAL
  Filled 2019-09-27: qty 2

## 2019-09-27 MED ORDER — TORSEMIDE 20 MG PO TABS
20.0000 mg | ORAL_TABLET | Freq: Every day | ORAL | Status: DC
  Administered 2019-09-27 – 2019-09-29 (×2): 20 mg via ORAL
  Filled 2019-09-27 (×2): qty 1

## 2019-09-27 MED ORDER — RENA-VITE PO TABS
1.0000 | ORAL_TABLET | Freq: Every day | ORAL | Status: DC
  Administered 2019-09-28 – 2019-09-29 (×2): 1 via ORAL
  Filled 2019-09-27 (×3): qty 1

## 2019-09-27 MED ORDER — ZOLPIDEM TARTRATE 5 MG PO TABS
5.0000 mg | ORAL_TABLET | Freq: Every evening | ORAL | Status: DC | PRN
  Administered 2019-09-28: 5 mg via ORAL
  Filled 2019-09-27: qty 1

## 2019-09-27 NOTE — Progress Notes (Signed)
HD started. 

## 2019-09-27 NOTE — Progress Notes (Addendum)
[facility] [place] NAME: Scott Oconnor    MR#:  [id number]  DATE OF BIRTH:  Mar 09, 1946  SUBJECTIVE:  No new complaints Eating BF. Getting ready to go for HD REVIEW OF SYSTEMS:   Review of Systems  Constitutional: Positive for malaise/fatigue. Negative for chills, fever and weight loss.  HENT: Negative for ear discharge, ear pain and nosebleeds.   Eyes: Negative for blurred vision, pain and discharge.  Respiratory: Positive for shortness of breath. Negative for sputum production, wheezing and stridor.   Cardiovascular: Negative for chest pain, palpitations, orthopnea and PND.  Gastrointestinal: Positive for nausea. Negative for abdominal pain, diarrhea and vomiting.  Genitourinary: Negative for frequency and urgency.  Musculoskeletal: Negative for back pain and joint pain.  Neurological: Positive for weakness. Negative for sensory change, speech change and focal weakness.  Psychiatric/Behavioral: Negative for depression and hallucinations. The patient is not nervous/anxious.    Tolerating Diet:yes Tolerating PT: pt ambulatory normally but weak today  DRUG ALLERGIES:  No Known Allergies  VITALS:  Blood pressure (!) 192/82, pulse 86, temperature 98.7 F (37.1 C), temperature source Oral, resp. rate 16, height 5\' 10"  (1.778 m), weight 53.5 kg, SpO2 93 %.  PHYSICAL EXAMINATION:   Physical Exam  GENERAL:  74 y.o.-year-old patient lying in the bed with no acute distress.  Thin, deconditoned EYES: Pupils equal, round, reactive to light and accommodation. No scleral icterus.   HEENT: Head atraumatic, normocephalic. Oropharynx and nasopharynx clear.  NECK:  Supple, no jugular venous distention. No thyroid enlargement, no tenderness.  LUNGS:decreased breath sounds bilaterally, no wheezing, rales, rhonchi. No use of accessory muscles of respiration. Perm cath+ CARDIOVASCULAR: S1, S2 normal. No murmurs, rubs, or gallops.  ABDOMEN: Soft,  nontender, nondistended. Bowel sounds present. No organomegaly or mass.  EXTREMITIES: No cyanosis, clubbing or edema b/l.    NEUROLOGIC: Cranial nerves II through XII are intact. No focal Motor or sensory deficits b/l.   PSYCHIATRIC:  patient is alert and oriented x 3.  SKIN: No obvious rash, lesion, or ulcer.   LABORATORY PANEL:  CBC Recent Labs  Lab 09/25/19 0614  WBC 9.2  HGB 9.5*  HCT 28.1*  PLT 188    Chemistries  Recent Labs  Lab 09/20/19 1113 09/21/19 0421 09/27/19 0428  NA 139   < > 140  K 3.5   < > 3.9  CL 102   < > 109  CO2 24   < > 18*  GLUCOSE 125*   < > 154*  BUN 56*   < > 73*  CREATININE 5.03*   < > 7.14*  CALCIUM 8.9   < > 8.6*  AST 30  --   --   ALT 41  --   --   ALKPHOS 156*  --   --   BILITOT 1.1  --   --    < > = values in this interval not displayed.   Cardiac Enzymes No results for input(s): TROPONINI in the last 168 hours. RADIOLOGY:  DG Chest 2 View  Result Date: 09/25/2019 CLINICAL DATA:  Hypoxia and cough.  Rule out TB. EXAM: CHEST - 2 VIEW COMPARISON:  None. FINDINGS: Normal heart size. There are moderate bilateral pleural effusions identified, left greater than right. Mild diffuse interstitial edema. Air space opacity within the right base is identified concerning for possible pneumonia. Visualized osseous structures are unremarkable. IMPRESSION: 1. Right lower lobe airspace disease concerning for pneumonia. 2. Suspect mild CHF with bilateral  pleural effusions and interstitial edema. Electronically Signed   By: Kerby Moors M.D.   On: 09/25/2019 10:42   PERIPHERAL VASCULAR CATHETERIZATION  Result Date: 09/26/2019 See op note  ECHOCARDIOGRAM COMPLETE  Result Date: 09/26/2019    ECHOCARDIOGRAM REPORT   Patient Name:   Scott Oconnor Date of Exam: 09/25/2019 Medical Rec #:  [id number]         Height:       70.0 in Accession #:    [id number]        Weight:       118.0 lb Date of Birth:  [birth year]         BSA:          1.668 m Patient Age:    18  years          BP:           165/71 mmHg Patient Gender: M                 HR:           79 bpm. Exam Location:  [facility] Procedure: 2D Echo, Cardiac Doppler and Color Doppler Indications:     R06.89 Acute Respiratory Insufficiency.  History:         Patient has no prior history of Echocardiogram examinations.                  Risk Factors:Hypertension, Dyslipidemia and Current Smoker.                  Peripheral vascular disease.  Sonographer:     Wilford Sports Rodgers-Jones Referring Phys:  [place] Diagnosing Phys: Ida Rogue MD IMPRESSIONS  1. Left ventricular ejection fraction, by estimation, is 60 to 65%. The left ventricle has normal function. The left ventricle has no regional wall motion abnormalities. There is mild left ventricular hypertrophy. Left ventricular diastolic parameters are consistent with Grade II diastolic dysfunction (pseudonormalization).  2. Right ventricular systolic function is normal. The right ventricular size is normal. There is mildly elevated pulmonary artery systolic pressure. The estimated right ventricular systolic pressure is 10.2 mmHg.  3. Mild to moderate mitral valve regurgitation.  4. Tricuspid valve regurgitation is moderate. FINDINGS  Left Ventricle: Left ventricular ejection fraction, by estimation, is 60 to 65%. The left ventricle has normal function. The left ventricle has no regional wall motion abnormalities. The left ventricular internal cavity size was normal in size. There is  mild left ventricular hypertrophy. Left ventricular diastolic parameters are consistent with Grade II diastolic dysfunction (pseudonormalization). Right Ventricle: The right ventricular size is normal. No increase in right ventricular wall thickness. Right ventricular systolic function is normal. There is mildly elevated pulmonary artery systolic pressure. The tricuspid regurgitant velocity is 2.93  m/s, and with an assumed right atrial pressure of 10 mmHg, the estimated right ventricular  systolic pressure is 58.5 mmHg. Left Atrium: Left atrial size was normal in size. Right Atrium: Right atrial size was normal in size. Pericardium: There is no evidence of pericardial effusion. Mitral Valve: The mitral valve is normal in structure. Normal mobility of the mitral valve leaflets. Mild to moderate mitral valve regurgitation. No evidence of mitral valve stenosis. Tricuspid Valve: The tricuspid valve is normal in structure. Tricuspid valve regurgitation is moderate . No evidence of tricuspid stenosis. Aortic Valve: The aortic valve is normal in structure. Aortic valve regurgitation is not visualized. No aortic stenosis is present. Pulmonic Valve: The pulmonic valve was normal in structure. Pulmonic valve regurgitation  is not visualized. No evidence of pulmonic stenosis. Aorta: The aortic root is normal in size and structure. Venous: The inferior vena cava is normal in size with greater than 50% respiratory variability, suggesting right atrial pressure of 3 mmHg. IAS/Shunts: No atrial level shunt detected by color flow Doppler.  LEFT VENTRICLE PLAX 2D LVIDd:         4.14 cm  Diastology LVIDs:         2.56 cm  LV e' lateral:   11.10 cm/s LV PW:         0.96 cm  LV E/e' lateral: 10.2 LV IVS:        0.93 cm  LV e' medial:    6.64 cm/s LVOT diam:     2.10 cm  LV E/e' medial:  17.0 LV SV:         65 LV SV Index:   39 LVOT Area:     3.46 cm  RIGHT VENTRICLE RV Basal diam:  3.71 cm RV S prime:     20.00 cm/s TAPSE (M-mode): 2.6 cm LEFT ATRIUM             Index       RIGHT ATRIUM           Index LA diam:        3.60 cm 2.16 cm/m  RA Area:     14.10 cm LA Vol (A2C):   45.5 ml 27.28 ml/m RA Volume:   38.20 ml  22.90 ml/m LA Vol (A4C):   58.6 ml 35.13 ml/m LA Biplane Vol: 52.3 ml 31.35 ml/m  AORTIC VALVE LVOT Vmax:   101.10 cm/s LVOT Vmean:  64.000 cm/s LVOT VTI:    0.187 m  AORTA Ao Root diam: 3.20 cm MITRAL VALVE                TRICUSPID VALVE MV Area (PHT): 3.85 cm     TR Peak grad:   34.3 mmHg MV Decel  Time: 197 msec     TR Vmax:        293.00 cm/s MV E velocity: 113.00 cm/s MV A velocity: 79.50 cm/s   SHUNTS MV E/A ratio:  1.42         Systemic VTI:  0.19 m                             Systemic Diam: 2.10 cm Ida Rogue MD Electronically signed by Ida Rogue MD Signature Date/Time: 09/26/2019/6:33:45 PM    Final    ASSESSMENT AND PLAN:  Terryon Pineiro  is a 74 y.o. male presenting to the hospital with some swelling in his feet and hands.  He is also been fatigued and nauseous.  He falls asleep easily.  He had a CT scan with contrast on April 7 and has not been feeling well since He had a recent AAA surgery on March 24.  Patient also having 10 to 15 pound weight loss and poor appetite  1. 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 [name] nephrology recommends start HD--dter and pt now agreeable  2. Acute hypoxic respiratory failure secondary to mild pulmonary edema  right lower lobe with COPD (noted on CXR) and ongoing tobacco abuse -no evidence of infection -CXR s/o fluid more than infeciton -white count normal, no fever, Pro calcitonin 0.12 -IV Lasix 40 mg times one-- discussed with nephrology -oxygen as needed--wean to room air  3. Hypertension-- uncontrolled -discontinued clonidine given increased fatigue ability, mild bradycardia -continue labetalol , hydralazine and amlodipine -echo results Mild LVH with Moderate TR. EF 60-65%  4. History of AAA repair recent and PVD -followed by Dr Delana Meyer  5. Anemia due to chronic kidney disease -hemoglobin stable   6. Mild hypokalemia  7.Nutrition Status: Nutrition Problem: Increased nutrient  needs Etiology: acute illness Signs/Symptoms: estimated needs Interventions: Ensure Enlive (each supplement provides 350kcal and 20 grams of protein), Boost Breeze   procedures: perm cath placement, Initiation of HD Family communication : discussed with dter Tanya in the room Consults : nephrology CODE STATUS: FULL DVT Prophylaxis :SCD  Status is: Inpatient  Remains inpatient appropriate because: plans for starting hemodialysis today (May 7th) s/p placement of perm cath per nephrology. Pt will need to get 3 treatments and out pt HD chair time   Dispo: The patient is from: Home              Anticipated d/c is to: Home              Anticipated d/c date is: > 3 days              Patient currently is not medically stable to d/c.  TOTAL TIME TAKING CARE OF THIS PATIENT: 30 minutes.  >50% time spent on counselling and coordination of care  Note: This dictation was prepared with Dragon dictation along with smaller phrase technology. Any transcriptional errors that result from this process are unintentional.  Fritzi Mandes M.D    [facility]   CC: Primary care physician; Verl Bangs, FNPPatient ID: Gevena Barre, male   DOB: 08/30/1945, 74 y.o.   MRN: [id number]

## 2019-09-27 NOTE — Progress Notes (Signed)
Nutrition Follow-up  DOCUMENTATION CODES:   Severe malnutrition in context of social or environmental circumstances, Underweight  INTERVENTION:  Recommend liberalizing diet to carbohydrate modified.  Provide Nepro Shake po TID, each supplement provides 425 kcal and 19 grams protein.  Provide Rena-vite QHS.  NUTRITION DIAGNOSIS:   Severe Malnutrition related to social / environmental circumstances(inadequate oral intake) as evidenced by severe fat depletion, severe muscle depletion.  New nutrition diagnosis.  GOAL:   Patient will meet greater than or equal to 90% of their needs  Progressing.  MONITOR:   PO intake, Supplement acceptance, Labs, Weight trends, I & O's  REASON FOR ASSESSMENT:   Malnutrition Screening Tool    ASSESSMENT:   74 y.o. male with medical history of hyperlipidemia, HTN, PVD, and Reynolds syndrome. He presented to the ED with swelling in feet and hands, fatigue, and nausea. He had AAA surgery on 08/14/19. He was admitted for AKI.  Met with patient and family member at bedside. His daughter had left the hospital already. Patient had perm cath placement yesterday and had his first HD treatment today. Patient was very tired and was resting with his eyes closed. Family member reports patient continues to eat very little at meals. He has been eating 0-25% of meals since 5/5 due to taste changes. He was previously documented to be eating 100% of his meals. He does not tolerate Ensure Enlive or Boost Breeze. Family had requested information on renal diet. Provided handout from [employer] on nutrition for kidney failure. Discussed that the plan is actually to liberalize patient's diet at this time as his potassium and phosphorus are WNL and he has poor PO intake. Completed a limited physical exam as patient wanted to continue resting.  Medications reviewed and include: Remeron 15 mg QHS, MVI daily.  Labs reviewed: CO2 18, BUN 73, Creatinine 7.14, Glucose  154.  Discussed with MD via secure chat and okay to liberalize diet.  NUTRITION - FOCUSED PHYSICAL EXAM:    Most Recent Value  Orbital Region  Severe depletion  Upper Arm Region  Severe depletion  Thoracic and Lumbar Region  Unable to assess  Buccal Region  Severe depletion  Temple Region  Severe depletion  Clavicle Bone Region  Severe depletion  Clavicle and Acromion Bone Region  Severe depletion  Scapular Bone Region  Unable to assess  Dorsal Hand  Severe depletion  Patellar Region  Unable to assess  Anterior Thigh Region  Unable to assess  Posterior Calf Region  Unable to assess  Edema (RD Assessment)  Mild  Hair  Reviewed  Eyes  Unable to assess  Mouth  Unable to assess  Skin  Reviewed  Nails  Reviewed     Diet Order:   Diet Order            Diet renal with fluid restriction Fluid restriction: 1500 mL Fluid; Room service appropriate? Yes; Fluid consistency: Thin  Diet effective now             EDUCATION NEEDS:   Education needs have been addressed  Skin:  Skin Assessment: Skin Integrity Issues:(closed incision to back)  Last BM:  09/24/2019  Height:   Ht Readings from Last 1 Encounters:  09/26/19 '5\' 10"'  (1.778 m)   Weight:   Wt Readings from Last 1 Encounters:  09/27/19 53.5 kg   Ideal Body Weight:  75.4 kg  BMI:  Body mass index is 16.92 kg/m.  Estimated Nutritional Needs:   Kcal:  1700-1900 kcal  Protein:  80-90  grams  Fluid:  >/= 2.2 L/day  Jacklynn Barnacle, MS, RD, LDN Pager number available on Amion

## 2019-09-27 NOTE — Progress Notes (Signed)
HD ended 

## 2019-09-27 NOTE — Progress Notes (Signed)
[facility], [place] 09/27/19  Subjective:   Hospital day # 7 Serum creatinine worse today at 7.14 BUN worse at 73 Patient agreed to dialysis this morning Seen during treatment States he ate some fish for dinner, few bites for breakfast Still requiring oxygen supplementation   HEMODIALYSIS FLOWSHEET:  Blood Flow Rate (mL/min): 250 mL/min Arterial Pressure (mmHg): -70 mmHg Venous Pressure (mmHg): 240 mmHg Transmembrane Pressure (mmHg): 20 mmHg Ultrafiltration Rate (mL/min): 250 mL/min Dialysate Flow Rate (mL/min): 500 ml/min Conductivity: Machine : 14 Conductivity: Machine : 14 Dialysis Fluid Bolus: Normal Saline Bolus Amount (mL): 250 mL      Objective:  Vital signs in last 24 hours:  Temp:  [98.6 F (37 C)-99.6 F (37.6 C)] 98.6 F (37 C) (05/07 1251) Pulse Rate:  [65-100] 97 (05/07 1251) Resp:  [16-22] 20 (05/07 1251) BP: (150-192)/(67-93) 190/83 (05/07 1251) SpO2:  [92 %-100 %] 93 % (05/07 1251) Weight:  [53.5 kg] 53.5 kg (05/07 0940)  Weight change:  Filed Weights   09/20/19 1055 09/26/19 1035 09/27/19 0940  Weight: 53.5 kg 53.5 kg 53.5 kg    Intake/Output:    Intake/Output Summary (Last 24 hours) at 09/27/2019 1354 Last data filed at 09/27/2019 1145 Gross per 24 hour  Intake 68.83 ml  Output 550 ml  Net -481.17 ml    Gen:   Alert, cooperative, no distress, appears stated age Head:   Normocephalic, without obvious abnormality, atraumatic Eyes/ENT:  Conjunctiva clear,  moist oral mucus membranes Neck:  Supple,  thyroid: not enlarged, no JVD Lungs:   Normal respiratory effort, coarse breath sounds at bases, [place] O2 Heart:   Regular rate and rhythm,  Abdomen:   Soft, non-tender,  Extremities: trace to 1+ edema bilaterally Skin:  Skin color, texture, turgor normal, no rashes or lesions Neurologic: Alert, able to follow commands Right IJ PermCath placement 11/10/2019     Basic Metabolic Panel:  Recent Labs  Lab  09/23/19 1801 09/23/19 1801 09/24/19 0445 09/24/19 0445 09/25/19 0615 09/26/19 0420 09/27/19 0428  NA 137  --  137  --  138 135 140  K 3.4*  --  3.1*  --  3.4* 3.3* 3.9  CL 106  --  107  --  107 106 109  CO2 20*  --  22  --  21* 18* 18*  GLUCOSE 168*  --  144*  --  135* 139* 154*  BUN 62*  --  66*  --  70* 64* 73*  CREATININE 5.85*  --  6.23*  --  6.71* 6.60* 7.14*  CALCIUM 8.3*   < > 8.4*   < > 8.1* 8.2* 8.6*  PHOS 4.6  --   --   --   --   --   --    < > = values in this interval not displayed.     CBC: Recent Labs  Lab 09/21/19 0421 09/22/19 0422 09/24/19 0445 09/24/19 1707 09/25/19 0614  WBC 9.4  --   --   --  9.2  HGB 9.7* 10.3* 8.9* 9.7* 9.5*  HCT 28.1*  --   --   --  28.1*  MCV 89.8  --   --   --  92.7  PLT 142*  --   --   --  188      Lab Results  Component Value Date   HEPBSAG NON REACTIVE 09/20/2019   HEPBSAB NON REACTIVE 09/20/2019   HEPBIGM NON REACTIVE 09/20/2019      Microbiology:  Recent Results (from the past 240 hour(s))  Respiratory Panel by RT PCR (Flu A&B, Covid) - Nasopharyngeal Swab     Status: None   Collection Time: 09/20/19 11:13 AM   Specimen: Nasopharyngeal Swab  Result Value Ref Range Status   SARS Coronavirus 2 by RT PCR NEGATIVE NEGATIVE Final    Comment: (NOTE) SARS-CoV-2 target nucleic acids are NOT DETECTED. The SARS-CoV-2 RNA is generally detectable in upper respiratoy specimens during the acute phase of infection. The lowest concentration of SARS-CoV-2 viral copies this assay can detect is 131 copies/mL. A negative result does not preclude SARS-Cov-2 infection and should not be used as the sole basis for treatment or other patient management decisions. A negative result may occur with  improper specimen collection/handling, submission of specimen other than nasopharyngeal swab, presence of viral mutation(s) within the areas targeted by this assay, and inadequate number of viral copies (<131 copies/mL). A negative  result must be combined with clinical observations, patient history, and epidemiological information. The expected result is Negative. Fact Sheet for Patients:  PinkCheek.be Fact Sheet for Healthcare Providers:  GravelBags.it This test is not yet ap proved or cleared by the [place] FDA and  has been authorized for detection and/or diagnosis of SARS-CoV-2 by FDA under an Emergency Use Authorization (EUA). This EUA will remain  in effect (meaning this test can be used) for the duration of the COVID-19 declaration under Section 564(b)(1) of the Act, 21 U.S.C. section 360bbb-3(b)(1), unless the authorization is terminated or revoked sooner.    Influenza A by PCR NEGATIVE NEGATIVE Final   Influenza B by PCR NEGATIVE NEGATIVE Final    Comment: (NOTE) The Xpert Xpress SARS-CoV-2/FLU/RSV assay is intended as an aid in  the diagnosis of influenza from Nasopharyngeal swab specimens and  should not be used as a sole basis for treatment. Nasal washings and  aspirates are unacceptable for Xpert Xpress SARS-CoV-2/FLU/RSV  testing. Fact Sheet for Patients: PinkCheek.be Fact Sheet for Healthcare Providers: GravelBags.it This test is not yet approved or cleared by the [place] FDA and  has been authorized for detection and/or diagnosis of SARS-CoV-2 by  FDA under an Emergency Use Authorization (EUA). This EUA will remain  in effect (meaning this test can be used) for the duration of the  Covid-19 declaration under Section 564(b)(1) of the Act, 21  U.S.C. section 360bbb-3(b)(1), unless the authorization is  terminated or revoked. Performed at [facility], [address] [place], [place] [address]     Coagulation Studies: No results for input(s): LABPROT, INR in the last 72 hours.  Urinalysis: No results for input(s): COLORURINE, LABSPEC, PHURINE, GLUCOSEU,  HGBUR, BILIRUBINUR, KETONESUR, PROTEINUR, UROBILINOGEN, NITRITE, LEUKOCYTESUR in the last 72 hours.  Invalid input(s): APPERANCEUR    Imaging: PERIPHERAL VASCULAR CATHETERIZATION  Result Date: 09/26/2019 See op note  ECHOCARDIOGRAM COMPLETE  Result Date: 09/26/2019    ECHOCARDIOGRAM REPORT   Patient Name:   Scott Oconnor Date of Exam: 09/25/2019 Medical Rec #:  [id number]         Height:       70.0 in Accession #:    [id number]        Weight:       118.0 lb Date of Birth:  1946-03-01         BSA:          1.668 m Patient Age:    52 years          BP:  165/71 mmHg Patient Gender: M                 HR:           79 bpm. Exam Location:  [facility] Procedure: 2D Echo, Cardiac Doppler and Color Doppler Indications:     R06.89 Acute Respiratory Insufficiency.  History:         Patient has no prior history of Echocardiogram examinations.                  Risk Factors:Hypertension, Dyslipidemia and Current Smoker.                  Peripheral vascular disease.  Sonographer:     Wilford Sports Rodgers-Jones Referring Phys:  [place] Diagnosing Phys: Ida Rogue MD IMPRESSIONS  1. Left ventricular ejection fraction, by estimation, is 60 to 65%. The left ventricle has normal function. The left ventricle has no regional wall motion abnormalities. There is mild left ventricular hypertrophy. Left ventricular diastolic parameters are consistent with Grade II diastolic dysfunction (pseudonormalization).  2. Right ventricular systolic function is normal. The right ventricular size is normal. There is mildly elevated pulmonary artery systolic pressure. The estimated right ventricular systolic pressure is 22.4 mmHg.  3. Mild to moderate mitral valve regurgitation.  4. Tricuspid valve regurgitation is moderate. FINDINGS  Left Ventricle: Left ventricular ejection fraction, by estimation, is 60 to 65%. The left ventricle has normal function. The left ventricle has no regional wall motion abnormalities. The left  ventricular internal cavity size was normal in size. There is  mild left ventricular hypertrophy. Left ventricular diastolic parameters are consistent with Grade II diastolic dysfunction (pseudonormalization). Right Ventricle: The right ventricular size is normal. No increase in right ventricular wall thickness. Right ventricular systolic function is normal. There is mildly elevated pulmonary artery systolic pressure. The tricuspid regurgitant velocity is 2.93  m/s, and with an assumed right atrial pressure of 10 mmHg, the estimated right ventricular systolic pressure is 82.5 mmHg. Left Atrium: Left atrial size was normal in size. Right Atrium: Right atrial size was normal in size. Pericardium: There is no evidence of pericardial effusion. Mitral Valve: The mitral valve is normal in structure. Normal mobility of the mitral valve leaflets. Mild to moderate mitral valve regurgitation. No evidence of mitral valve stenosis. Tricuspid Valve: The tricuspid valve is normal in structure. Tricuspid valve regurgitation is moderate . No evidence of tricuspid stenosis. Aortic Valve: The aortic valve is normal in structure. Aortic valve regurgitation is not visualized. No aortic stenosis is present. Pulmonic Valve: The pulmonic valve was normal in structure. Pulmonic valve regurgitation is not visualized. No evidence of pulmonic stenosis. Aorta: The aortic root is normal in size and structure. Venous: The inferior vena cava is normal in size with greater than 50% respiratory variability, suggesting right atrial pressure of 3 mmHg. IAS/Shunts: No atrial level shunt detected by color flow Doppler.  LEFT VENTRICLE PLAX 2D LVIDd:         4.14 cm  Diastology LVIDs:         2.56 cm  LV e' lateral:   11.10 cm/s LV PW:         0.96 cm  LV E/e' lateral: 10.2 LV IVS:        0.93 cm  LV e' medial:    6.64 cm/s LVOT diam:     2.10 cm  LV E/e' medial:  17.0 LV SV:         65 LV SV Index:  39 LVOT Area:     3.46 cm  RIGHT VENTRICLE RV Basal  diam:  3.71 cm RV S prime:     20.00 cm/s TAPSE (M-mode): 2.6 cm LEFT ATRIUM             Index       RIGHT ATRIUM           Index LA diam:        3.60 cm 2.16 cm/m  RA Area:     14.10 cm LA Vol (A2C):   45.5 ml 27.28 ml/m RA Volume:   38.20 ml  22.90 ml/m LA Vol (A4C):   58.6 ml 35.13 ml/m LA Biplane Vol: 52.3 ml 31.35 ml/m  AORTIC VALVE LVOT Vmax:   101.10 cm/s LVOT Vmean:  64.000 cm/s LVOT VTI:    0.187 m  AORTA Ao Root diam: 3.20 cm MITRAL VALVE                TRICUSPID VALVE MV Area (PHT): 3.85 cm     TR Peak grad:   34.3 mmHg MV Decel Time: 197 msec     TR Vmax:        293.00 cm/s MV E velocity: 113.00 cm/s MV A velocity: 79.50 cm/s   SHUNTS MV E/A ratio:  1.42         Systemic VTI:  0.19 m                             Systemic Diam: 2.10 cm Ida Rogue MD Electronically signed by Ida Rogue MD Signature Date/Time: 09/26/2019/6:33:45 PM    Final      Medications:    . amLODipine  10 mg Oral Daily  . Chlorhexidine Gluconate Cloth  6 each Topical Daily  . feeding supplement  1 Container Oral Q24H  . feeding supplement (ENSURE ENLIVE)  237 mL Oral BID BM  . hydrALAZINE  50 mg Oral TID  . labetalol  100 mg Oral BID  . mirtazapine  15 mg Oral QHS  . multivitamin with minerals  1 tablet Oral Daily   acetaminophen, labetalol, ondansetron **OR** ondansetron (ZOFRAN) IV, ondansetron (ZOFRAN) IV, sodium chloride  Assessment/ Plan:  74 y.o. [place]) male with Raynaud's phenomena, AAA status post repair on 3/24, tobacco use admitted on 09/20/2019 for History of AAA (abdominal aortic aneurysm) repair [Z98.890] Acute kidney injury ([place]) [N17.9] Acute renal failure, unspecified acute renal failure type ([place]) [N17.9]  1. Acute renal failure 2. Chronic kidney disease stage IIIA: baseline creatinine of 1.28, GFR of 56 on 08/12/19;  possibly new baseline 1.66 on August 28, 2019 3. Proteinuria 4.  Atheroembolism of the kidney I CD10 I-75.81 5. Anemia with renal failure 6. AAA  6  cm.  Status post endovascular repair 3/25.  Complicated by embolization of both great toes.  Post procedure cr 1.66 on Apr 7 ; Repeat IV contrast exposure 75 cc on April 7 for CT angiogram 7.  Hypokalemia 8.  Hypertension   Preliminary results of kidney biopsy show cholesterol embolic disease and ATN It is possible renal recovery has started but due to mild uremic symptoms, we elected to start dialysis.  Patient underwent first treatment on 09/27/2019.  Tolerated well We will also initiate admission to outpatient dialysis unit (transitional care unit) in case he needs any treatments after his discharge for AKI. Will be ready for discharge from renal standpoint once confirmation is obtained from outpatient dialysis  center.   Results for EVIAN, SALGUERO (MRN [id number]) as of 09/24/2019 13:28  Ref. Range 09/20/2019 18:08  SEE BELOW Unknown Comment  Anti Nuclear Antibody (ANA) Latest Ref Range: Negative  Positive (A)  ds DNA Ab Latest Ref Range: 0 - 9 IU/mL <1  GBM Ab Latest Ref Range: 0 - 20 units 3  ENA SM Ab Ser-aCnc Latest Ref Range: 0.0 - 0.9 AI <0.2  C3 Complement Latest Ref Range: 82 - 167 mg/dL 129  Complement C4, Body Fluid Latest Ref Range: 12 - 38 mg/dL 26  Kappa free light chain Latest Ref Range: 3.3 - 19.4 mg/L 104.7 (H)  Lamda free light chains Latest Ref Range: 5.7 - 26.3 mg/L 74.4 (H)  Kappa, lamda light chain ratio Latest Ref Range: 0.26 - 1.65  1.41  Ribonucleic Protein Latest Ref Range: 0.0 - 0.9 AI 0.2  SSA (Ro) (ENA) Antibody, IgG Latest Ref Range: 0.0 - 0.9 AI <0.2  SSB (La) (ENA) Antibody, IgG Latest Ref Range: 0.0 - 0.9 AI <0.2    LOS: 7 Denelda Akerley 5/7/20211:54 PM  [place], [place]  Note: This note was prepared with Dragon dictation. Any transcription errors are unintentional

## 2019-09-27 NOTE — Progress Notes (Signed)
PRE HD   

## 2019-09-27 NOTE — Plan of Care (Signed)
  Problem: Education: Goal: Knowledge of disease and its progression will improve Outcome: Progressing   Problem: Fluid Volume: Goal: Fluid volume balance will be maintained or improved Outcome: Progressing   Problem: Urinary Elimination: Goal: Progression of disease will be identified and treated Outcome: Progressing

## 2019-09-27 NOTE — TOC Initial Note (Signed)
Transition of Care [facility]) - Initial/Assessment Note    Patient Details  Name: Scott Oconnor MRN: [id number] Date of Birth: Apr 23, 1946  Transition of [facility]) CM/SW Contact:    Elease Hashimoto, LCSW Phone Number: 09/27/2019, 2:10 PM  Clinical Narrative:   Met with daughter while pt is in HD. She reports he does well then he doesn't. Both are hopeful he will do well once HD starts. Want to wean him off O2 and regulate his BP. Their family is very involved and wil make sure he has what he needs. She wanted an Advanced Directive packet which gave her, she will get completed and works at bank to get notorized. Will follow along in case needs home O2. Pt was independent PTA and have met with Amanda-HD navigator to set up OP-HD.            Expected Discharge Plan: Home/Self Care Barriers to Discharge: Continued Medical Work up   Patient Goals and CMS Choice Patient states their goals for this hospitalization and ongoing recovery are:: I want to be better and off oxygen      Expected Discharge Plan and Services Expected Discharge Plan: Home/Self Care In-house Referral: Clinical Social Work   Post Acute Care Choice: Dialysis Living arrangements for the past 2 months: Single Family Home                                      Prior Living Arrangements/Services Living arrangements for the past 2 months: Single Family Home Lives with:: Spouse Patient language and need for interpreter reviewed:: No Do you feel safe going back to the place where you live?: Yes      Need for Family Participation in Patient Care: Yes (Comment) Care giver support system in place?: Yes (comment)   Criminal Activity/Legal Involvement Pertinent to Current Situation/Hospitalization: No - Comment as needed  Activities of Daily Living Home Assistive Devices/Equipment: None ADL Screening (condition at time of admission) Patient's cognitive ability adequate to safely complete daily activities?: Yes Is  the patient deaf or have difficulty hearing?: No Does the patient have difficulty seeing, even when wearing glasses/contacts?: No Does the patient have difficulty concentrating, remembering, or making decisions?: No Patient able to express need for assistance with ADLs?: Yes Does the patient have difficulty dressing or bathing?: No Independently performs ADLs?: Yes (appropriate for developmental age) Does the patient have difficulty walking or climbing stairs?: No Weakness of Legs: Both Weakness of Arms/Hands: None  Permission Sought/Granted Permission sought to share information with : Family Supports Permission granted to share information with : Yes, Verbal Permission Granted  Share Information with NAME: daughter           Emotional Assessment Appearance:: Appears stated age Attitude/Demeanor/Rapport: Gracious Affect (typically observed): Adaptable, Accepting Orientation: : Oriented to Self, Oriented to Place, Oriented to  Time, Oriented to Situation Alcohol / Substance Use: Never Used Psych Involvement: No (comment)  Admission diagnosis:  History of AAA (abdominal aortic aneurysm) repair [G83.662] Acute kidney injury ([place]) [N17.9] Acute renal failure, unspecified acute renal failure type [facility]) [N17.9] Patient Active Problem List   Diagnosis Date Noted  . Acute renal failure ([place])   . History of AAA (abdominal aortic aneurysm) repair   . Anemia   . Thrombocytopenia ([place])   . Acute kidney injury superimposed on CKD ([place])   . Essential hypertension   . Hyperlipidemia   . Fatigue 09/19/2019  .  AAA (abdominal aortic aneurysm) ([place]) 08/14/2019  . AAA (abdominal aortic aneurysm) without rupture ([place]) 08/01/2019  . PVD (peripheral vascular disease) ([place]) 07/24/2019  . Tobacco abuse 07/24/2019  . Raynaud's disease without gangrene 06/21/2019  . Mixed dyslipidemia 06/15/2018  . Family history of heart attack 06/15/2018  . Smokers' cough ([place]) 06/16/2017   PCP:  Verl Bangs, FNP Pharmacy:   [facility] [phone] [address], [address] [place] [address] Phone: [phone] Fax: [phone]     Social Determinants of Health (SDOH) Interventions    Readmission Risk Interventions No flowsheet data found.

## 2019-09-27 NOTE — Progress Notes (Signed)
Post HD  

## 2019-09-28 LAB — BASIC METABOLIC PANEL
Anion gap: 13 (ref 5–15)
BUN: 54 mg/dL — ABNORMAL HIGH (ref 8–23)
CO2: 24 mmol/L (ref 22–32)
Calcium: 8.5 mg/dL — ABNORMAL LOW (ref 8.9–10.3)
Chloride: 105 mmol/L (ref 98–111)
Creatinine, Ser: 5.83 mg/dL — ABNORMAL HIGH (ref 0.61–1.24)
GFR calc Af Amer: 10 mL/min — ABNORMAL LOW (ref >60)
GFR calc non Af Amer: 9 mL/min — ABNORMAL LOW (ref >60)
Glucose, Bld: 154 mg/dL — ABNORMAL HIGH (ref 70–99)
Potassium: 3.8 mmol/L (ref 3.5–5.1)
Sodium: 142 mmol/L (ref 135–145)

## 2019-09-28 MED ORDER — HEPARIN SODIUM (PORCINE) 5000 UNIT/ML IJ SOLN
5000.0000 [IU] | Freq: Three times a day (TID) | INTRAMUSCULAR | Status: DC
  Administered 2019-09-28 – 2019-09-29 (×3): 5000 [IU] via SUBCUTANEOUS
  Filled 2019-09-28 (×3): qty 1

## 2019-09-28 MED ORDER — HYDRALAZINE HCL 25 MG PO TABS
75.0000 mg | ORAL_TABLET | Freq: Three times a day (TID) | ORAL | Status: DC
  Administered 2019-09-28 – 2019-09-29 (×5): 75 mg via ORAL
  Filled 2019-09-28 (×5): qty 3

## 2019-09-28 NOTE — Progress Notes (Signed)
BP 187/94, HR 99.  BP medications due.  Checked with Dr. Holley Raring if pt has HD today and if to hold BP meds.  Per MD to give amlodipine and labetalol.

## 2019-09-28 NOTE — Progress Notes (Signed)
[place], [place] 09/28/19  Subjective:   Hospital day # 8 Patient had first dialysis treatment yesterday. Urine output however appears to be improving at 1 L over the preceding 24 hours. Appears a bit depressed.   HEMODIALYSIS FLOWSHEET:  Blood Flow Rate (mL/min): 250 mL/min Arterial Pressure (mmHg): -70 mmHg Venous Pressure (mmHg): 240 mmHg Transmembrane Pressure (mmHg): 20 mmHg Ultrafiltration Rate (mL/min): 250 mL/min Dialysate Flow Rate (mL/min): 500 ml/min Conductivity: Machine : 14 Conductivity: Machine : 14 Dialysis Fluid Bolus: Normal Saline Bolus Amount (mL): 250 mL      Objective:  Vital signs in last 24 hours:  Temp:  [98.1 F (36.7 C)-98.7 F (37.1 C)] 98.6 F (37 C) (05/08 1617) Pulse Rate:  [88-99] 88 (05/08 1617) Resp:  [16-18] 16 (05/08 1617) BP: (152-187)/(69-94) 152/76 (05/08 1617) SpO2:  [90 %-94 %] 93 % (05/08 1617)  Weight change: 0 kg Filed Weights   09/20/19 1055 09/26/19 1035 09/27/19 0940  Weight: 53.5 kg 53.5 kg 53.5 kg    Intake/Output:    Intake/Output Summary (Last 24 hours) at 09/28/2019 1752 Last data filed at 09/28/2019 7322 Gross per 24 hour  Intake --  Output 375 ml  Net -375 ml    Gen:   No acute distress Head:   Normocephalic, without obvious abnormality, atraumatic Eyes/ENT:  Anicteric Neck:  Supple Lungs:   Normal respiratory effort, clear bilateral Heart:   Regular rate and rhythm Abdomen:   Soft, non-tender, bowel sounds present Extremities: trace lower extremity edema Skin:  Skin color, texture, turgor normal, no rashes or lesions Neurologic: Alert, able to follow commands Right IJ PermCath placement 11/10/2019     Basic Metabolic Panel:  Recent Labs  Lab 09/23/19 1801 09/23/19 1801 09/24/19 0445 09/24/19 0445 09/25/19 0615 09/25/19 0615 09/26/19 0420 09/27/19 0428 09/28/19 0427  NA 137   < > 137  --  138  --  135 140 142  K 3.4*   < > 3.1*  --  3.4*  --  3.3* 3.9 3.8   CL 106   < > 107  --  107  --  106 109 105  CO2 20*   < > 22  --  21*  --  18* 18* 24  GLUCOSE 168*   < > 144*  --  135*  --  139* 154* 154*  BUN 62*   < > 66*  --  70*  --  64* 73* 54*  CREATININE 5.85*   < > 6.23*  --  6.71*  --  6.60* 7.14* 5.83*  CALCIUM 8.3*   < > 8.4*   < > 8.1*   < > 8.2* 8.6* 8.5*  PHOS 4.6  --   --   --   --   --   --   --   --    < > = values in this interval not displayed.     CBC: Recent Labs  Lab 09/22/19 0422 09/24/19 0445 09/24/19 1707 09/25/19 0614  WBC  --   --   --  9.2  HGB 10.3* 8.9* 9.7* 9.5*  HCT  --   --   --  28.1*  MCV  --   --   --  92.7  PLT  --   --   --  188      Lab Results  Component Value Date   HEPBSAG NON REACTIVE 09/20/2019   HEPBSAB NON REACTIVE 09/20/2019   HEPBIGM NON REACTIVE 09/20/2019  Microbiology:  Recent Results (from the past 240 hour(s))  Respiratory Panel by RT PCR (Flu A&B, Covid) - Nasopharyngeal Swab     Status: None   Collection Time: 09/20/19 11:13 AM   Specimen: Nasopharyngeal Swab  Result Value Ref Range Status   SARS Coronavirus 2 by RT PCR NEGATIVE NEGATIVE Final    Comment: (NOTE) SARS-CoV-2 target nucleic acids are NOT DETECTED. The SARS-CoV-2 RNA is generally detectable in upper respiratoy specimens during the acute phase of infection. The lowest concentration of SARS-CoV-2 viral copies this assay can detect is 131 copies/mL. A negative result does not preclude SARS-Cov-2 infection and should not be used as the sole basis for treatment or other patient management decisions. A negative result may occur with  improper specimen collection/handling, submission of specimen other than nasopharyngeal swab, presence of viral mutation(s) within the areas targeted by this assay, and inadequate number of viral copies (<131 copies/mL). A negative result must be combined with clinical observations, patient history, and epidemiological information. The expected result is Negative. Fact Sheet  for Patients:  PinkCheek.be Fact Sheet for Healthcare Providers:  GravelBags.it This test is not yet ap proved or cleared by the [place] FDA and  has been authorized for detection and/or diagnosis of SARS-CoV-2 by FDA under an Emergency Use Authorization (EUA). This EUA will remain  in effect (meaning this test can be used) for the duration of the COVID-19 declaration under Section 564(b)(1) of the Act, 21 U.S.C. section 360bbb-3(b)(1), unless the authorization is terminated or revoked sooner.    Influenza A by PCR NEGATIVE NEGATIVE Final   Influenza B by PCR NEGATIVE NEGATIVE Final    Comment: (NOTE) The Xpert Xpress SARS-CoV-2/FLU/RSV assay is intended as an aid in  the diagnosis of influenza from Nasopharyngeal swab specimens and  should not be used as a sole basis for treatment. Nasal washings and  aspirates are unacceptable for Xpert Xpress SARS-CoV-2/FLU/RSV  testing. Fact Sheet for Patients: PinkCheek.be Fact Sheet for Healthcare Providers: GravelBags.it This test is not yet approved or cleared by the [place] FDA and  has been authorized for detection and/or diagnosis of SARS-CoV-2 by  FDA under an Emergency Use Authorization (EUA). This EUA will remain  in effect (meaning this test can be used) for the duration of the  Covid-19 declaration under Section 564(b)(1) of the Act, 21  U.S.C. section 360bbb-3(b)(1), unless the authorization is  terminated or revoked. Performed at [facility], [address] [place], [place] [address]     Coagulation Studies: No results for input(s): LABPROT, INR in the last 72 hours.  Urinalysis: No results for input(s): COLORURINE, LABSPEC, PHURINE, GLUCOSEU, HGBUR, BILIRUBINUR, KETONESUR, PROTEINUR, UROBILINOGEN, NITRITE, LEUKOCYTESUR in the last 72 hours.  Invalid input(s): APPERANCEUR     Imaging: No results found.   Medications:    . amLODipine  10 mg Oral Daily  . Chlorhexidine Gluconate Cloth  6 each Topical Daily  . feeding supplement (NEPRO CARB STEADY)  237 mL Oral TID BM  . heparin injection (subcutaneous)  5,000 Units Subcutaneous Q8H  . hydrALAZINE  75 mg Oral TID  . labetalol  100 mg Oral BID  . mirtazapine  15 mg Oral QHS  . multivitamin  1 tablet Oral QHS  . torsemide  20 mg Oral Daily   acetaminophen, bisacodyl, labetalol, ondansetron **OR** ondansetron (ZOFRAN) IV, ondansetron (ZOFRAN) IV, sodium chloride, zolpidem  Assessment/ Plan:  74 y.o. 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 2. Chronic kidney disease stage IIIA: baseline creatinine of 1.28, GFR of 56 on 08/12/19;  possibly new baseline 1.66 on August 28, 2019 3. Proteinuria 4.  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 Anemia with renal failure 6. AAA  6 cm.  Status post endovascular repair 3/25.  Complicated by embolization of both great toes.  Post procedure cr 1.66 on Apr 7 ; Repeat IV contrast exposure 75 cc on April 7 for CT angiogram 7.  Hypokalemia 8.  Hypertension   Plan:  It appears that the patient's renal function has started to improve.  Urine output was 1.0 L over the preceding 24 hours.  Therefore we will hold off on further dialysis at the moment.  Follow renal parameters.  Unclear as to where his creatinine will settle out given the recent insult.  In addition patient appears to be a bit depressed.  Further management of this as per hospitalist.  We will continue to monitor his progress closely.   Results for  BRYAM, TABORDA (MRN [id number]) as of 09/24/2019 13:28  Ref. Range 09/20/2019 18:08  SEE BELOW Unknown Comment  Anti Nuclear Antibody (ANA) Latest Ref Range: Negative  Positive (A)  ds DNA Ab Latest Ref Range: 0 - 9 IU/mL <1  GBM Ab Latest Ref Range: 0 - 20 units 3  ENA SM Ab Ser-aCnc Latest Ref Range: 0.0 - 0.9 AI <0.2  C3 Complement Latest Ref Range: 82 - 167 mg/dL 129  Complement C4, Body Fluid Latest Ref Range: 12 - 38 mg/dL 26  Kappa free light chain Latest Ref Range: 3.3 - 19.4 mg/L 104.7 (H)  Lamda free light chains Latest Ref Range: 5.7 - 26.3 mg/L 74.4 (H)  Kappa, lamda light chain ratio Latest Ref Range: 0.26 - 1.65  1.41  Ribonucleic Protein Latest Ref Range: 0.0 - 0.9 AI 0.2  SSA (Ro) (ENA) Antibody, IgG Latest Ref Range: 0.0 - 0.9 AI <0.2  SSB (La) (ENA) Antibody, IgG Latest Ref Range: 0.0 - 0.9 AI <0.2    LOS: 8 Scott [place] 5/8/20215:52 PM  [place], [place]  Note: This note was prepared with Dragon dictation. Any transcription errors are unintentional

## 2019-09-28 NOTE — Progress Notes (Signed)
[facility] [place] NAME: Arsenio Schnorr    MR#:  [id number]  DATE OF BIRTH:  01-18-1946  SUBJECTIVE:  No new complaints  Had uneventful HD yday Gets a bit anxious and feels he cannot breathe REVIEW OF SYSTEMS:   Review of Systems  Constitutional: Positive for malaise/fatigue. Negative for chills, fever and weight loss.  HENT: Negative for ear discharge, ear pain and nosebleeds.   Eyes: Negative for blurred vision, pain and discharge.  Respiratory: Positive for shortness of breath. Negative for sputum production, wheezing and stridor.   Cardiovascular: Negative for chest pain, palpitations, orthopnea and PND.  Gastrointestinal: Negative for abdominal pain, diarrhea and vomiting.  Genitourinary: Negative for frequency and urgency.  Musculoskeletal: Negative for back pain and joint pain.  Neurological: Positive for weakness. Negative for sensory change, speech change and focal weakness.  Psychiatric/Behavioral: Negative for depression and hallucinations. The patient is nervous/anxious.    Tolerating Diet:yes Tolerating PT: pt ambulatory normally but weak today  DRUG ALLERGIES:  No Known Allergies  VITALS:  Blood pressure (!) 187/94, pulse 99, temperature 98.1 F (36.7 C), temperature source Oral, resp. rate 18, height 5\' 10"  (1.778 m), weight 53.5 kg, SpO2 94 %.  PHYSICAL EXAMINATION:   Physical Exam  GENERAL:  74 y.o.-year-old patient lying in the bed with no acute distress.  Thin, deconditoned EYES: Pupils equal, round, reactive to light and accommodation. No scleral icterus.   HEENT: Head atraumatic, normocephalic. Oropharynx and nasopharynx clear.  NECK:  Supple, no jugular venous distention. No thyroid enlargement, no tenderness.  LUNGS:decreased breath sounds bilaterally, no wheezing, rales, rhonchi. No use of accessory muscles of respiration. Perm cath+ CARDIOVASCULAR: S1, S2 normal. No murmurs, rubs, or gallops.   ABDOMEN: Soft, nontender, nondistended. Bowel sounds present. No organomegaly or mass.  EXTREMITIES: No cyanosis, clubbing or edema b/l.    NEUROLOGIC: Cranial nerves II through XII are intact. No focal Motor or sensory deficits b/l.   PSYCHIATRIC:  patient is alert and oriented x 3.  SKIN: No obvious rash, lesion, or ulcer.   LABORATORY PANEL:  CBC Recent Labs  Lab 09/25/19 0614  WBC 9.2  HGB 9.5*  HCT 28.1*  PLT 188    Chemistries  Recent Labs  Lab 09/28/19 0427  NA 142  K 3.8  CL 105  CO2 24  GLUCOSE 154*  BUN 54*  CREATININE 5.83*  CALCIUM 8.5*   Cardiac Enzymes No results for input(s): TROPONINI in the last 168 hours. RADIOLOGY:  PERIPHERAL VASCULAR CATHETERIZATION  Result Date: 09/26/2019 See op note  ASSESSMENT AND PLAN:  Hartford Maulden  is a 74 y.o. male presenting to the hospital with some swelling in his feet and hands.  He is also been fatigued and nauseous.  He falls asleep easily.  He had a CT scan with contrast on April 7 and has not been feeling well since He had a recent AAA surgery on March 24.  Patient also having 10 to 15 pound weight loss and poor appetite  1. 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 [name] nephrology recommends start HD--#1 rx on 09/27/2019  2. Acute hypoxic respiratory failure secondary to mild pulmonary edema  right lower lobe with COPD (noted on CXR) and ongoing tobacco abuse -no evidence of infection -CXR s/o fluid more than infeciton -white count normal, no fever, Pro calcitonin 0.12 -IV Lasix 40 mg times one-- discussed with nephrology -oxygen as needed--wean to room  air -torsemide 20 mg qd per dr [name]  3. Hypertension-- uncontrolled -discontinued clonidine given increased fatigue ability, mild bradycardia -continue labetalol , hydralazine and amlodipine -increased hydralazine to 75 mg tid -echo results Mild LVH with Moderate TR. EF 60-65%  4. History of AAA repair recent and PVD -followed by Dr Delana Meyer  5. Anemia due to chronic kidney disease -hemoglobin stable   6. Mild hypokalemia  7.Nutrition Status: Nutrition Problem: Severe Malnutrition Etiology: social / environmental circumstances(inadequate oral intake) Signs/Symptoms: severe fat depletion, severe muscle depletion Interventions: Refer to RD note for recommendations   procedures: perm cath placement, Initiation of HD Family communication : discussed with dter [name] in the room Consults : nephrology CODE STATUS: FULL DVT Prophylaxis :heparin  Status is: Inpatient  Remains inpatient appropriate because: plans for starting hemodialysis today (May 7th) s/p placement of perm cath per nephrology. Nephrology needs to decide pt's HD schedule   Dispo: The patient is from: Home              Anticipated d/c is to: Home              Anticipated d/c date is: > 3 days              Patient currently is not medically stable to d/c.  TOTAL TIME TAKING CARE OF THIS PATIENT: 30 minutes.  >50% time spent on counselling and coordination of care  Note: This dictation was prepared with Dragon dictation along with smaller phrase technology. Any transcriptional errors that result from this process are unintentional.  Fritzi Mandes M.D    [facility]   CC: Primary care physician; Verl Bangs, FNPPatient ID: Gevena Barre, male   DOB: 1946/05/17, 74 y.o.   MRN: [id number]

## 2019-09-28 NOTE — Plan of Care (Signed)
Pt reports fatigue, difficulty falling asleep. Seems depressed.  Poor appetite.  Reports nausea, Zofran given.  Encouraged pt to use incentive spirometer several times.  Pt said he already did and did not want to do it with RN.

## 2019-09-29 ENCOUNTER — Inpatient Hospital Stay: Payer: Medicare Other

## 2019-09-29 LAB — CBC
HCT: 32.1 % — ABNORMAL LOW (ref 39.0–52.0)
Hemoglobin: 10.4 g/dL — ABNORMAL LOW (ref 13.0–17.0)
MCH: 31.3 pg (ref 26.0–34.0)
MCHC: 32.4 g/dL (ref 30.0–36.0)
MCV: 96.7 fL (ref 80.0–100.0)
Platelets: 252 10*3/uL (ref 150–400)
RBC: 3.32 MIL/uL — ABNORMAL LOW (ref 4.22–5.81)
RDW: 19.1 % — ABNORMAL HIGH (ref 11.5–15.5)
WBC: 10.1 10*3/uL (ref 4.0–10.5)
nRBC: 0.2 % (ref 0.0–0.2)

## 2019-09-29 LAB — BASIC METABOLIC PANEL
Anion gap: 13 (ref 5–15)
BUN: 72 mg/dL — ABNORMAL HIGH (ref 8–23)
CO2: 25 mmol/L (ref 22–32)
Calcium: 8.6 mg/dL — ABNORMAL LOW (ref 8.9–10.3)
Chloride: 104 mmol/L (ref 98–111)
Creatinine, Ser: 6.75 mg/dL — ABNORMAL HIGH (ref 0.61–1.24)
GFR calc Af Amer: 9 mL/min — ABNORMAL LOW (ref >60)
GFR calc non Af Amer: 7 mL/min — ABNORMAL LOW (ref >60)
Glucose, Bld: 152 mg/dL — ABNORMAL HIGH (ref 70–99)
Potassium: 3.8 mmol/L (ref 3.5–5.1)
Sodium: 142 mmol/L (ref 135–145)

## 2019-09-29 LAB — EXPECTORATED SPUTUM ASSESSMENT W GRAM STAIN, RFLX TO RESP C

## 2019-09-29 LAB — BRAIN NATRIURETIC PEPTIDE: B Natriuretic Peptide: 1195 pg/mL — ABNORMAL HIGH (ref 0.0–100.0)

## 2019-09-29 MED ORDER — NICOTINE 21 MG/24HR TD PT24
21.0000 mg | MEDICATED_PATCH | Freq: Every day | TRANSDERMAL | Status: DC
  Administered 2019-09-29 – 2019-09-30 (×2): 21 mg via TRANSDERMAL
  Filled 2019-09-29 (×2): qty 1

## 2019-09-29 MED ORDER — IPRATROPIUM-ALBUTEROL 0.5-2.5 (3) MG/3ML IN SOLN: 3.0000 mL | Freq: Four times a day (QID) | RESPIRATORY_TRACT | Status: DC | PRN

## 2019-09-29 MED ORDER — LEVOFLOXACIN 500 MG PO TABS
500.0000 mg | ORAL_TABLET | ORAL | Status: DC
  Administered 2019-09-29: 500 mg via ORAL
  Filled 2019-09-29: qty 1

## 2019-09-29 NOTE — Progress Notes (Signed)
Pre hd 

## 2019-09-29 NOTE — Progress Notes (Signed)
HD started. 

## 2019-09-29 NOTE — Progress Notes (Signed)
HD ended 

## 2019-09-29 NOTE — Progress Notes (Signed)
[place], [place] 09/29/19  Subjective:   Hospital day # 9 Patient does appear quite weak today. Apparently had episode of delirium overnight after being exposed to Ambien. Also having some increasing shortness of breath. Renal function worse therefore we decided to proceed with dialysis treatment today.     Objective:  Vital signs in last 24 hours:  Temp:  [98.4 F (36.9 C)-99.2 F (37.3 C)] 98.4 F (36.9 C) (05/09 1305) Pulse Rate:  [86-98] 95 (05/09 1538) Resp:  [16-23] 20 (05/09 1538) BP: (129-199)/(63-80) 152/72 (05/09 1538) SpO2:  [81 %-93 %] 90 % (05/09 1330)  Weight change:  Filed Weights   09/20/19 1055 09/26/19 1035 09/27/19 0940  Weight: 53.5 kg 53.5 kg 53.5 kg    Intake/Output:    Intake/Output Summary (Last 24 hours) at 09/29/2019 1547 Last data filed at 09/29/2019 1538 Gross per 24 hour  Intake 100 ml  Output 575 ml  Net -475 ml    Gen:   No acute distress Head:   Normocephalic, atraumatic Eyes/ENT:  Anicteric Neck:  Supple Lungs:   Normal respiratory effort, clear bilateral Heart:   Regular rate and rhythm Abdomen:   Soft, non-tender, bowel sounds present Extremities: trace lower extremity edema Skin:  No rashes Neurologic: Alert, able to follow commands Right IJ PermCath placement 11/10/2019     Basic Metabolic Panel:  Recent Labs  Lab 09/23/19 1801 09/24/19 0445 09/25/19 0615 09/25/19 0615 09/26/19 0420 09/26/19 0420 09/27/19 0428 09/28/19 0427 09/29/19 0642  NA 137   < > 138  --  135  --  140 142 142  K 3.4*   < > 3.4*  --  3.3*  --  3.9 3.8 3.8  CL 106   < > 107  --  106  --  109 105 104  CO2 20*   < > 21*  --  18*  --  18* 24 25  GLUCOSE 168*   < > 135*  --  139*  --  154* 154* 152*  BUN 62*   < > 70*  --  64*  --  73* 54* 72*  CREATININE 5.85*   < > 6.71*  --  6.60*  --  7.14* 5.83* 6.75*  CALCIUM 8.3*   < > 8.1*   < > 8.2*   < > 8.6* 8.5* 8.6*  PHOS 4.6  --   --   --   --   --   --   --   --     < > = values in this interval not displayed.     CBC: Recent Labs  Lab 09/24/19 0445 09/24/19 1707 09/25/19 0614 09/29/19 0642  WBC  --   --  9.2 10.1  HGB 8.9* 9.7* 9.5* 10.4*  HCT  --   --  28.1* 32.1*  MCV  --   --  92.7 96.7  PLT  --   --  188 252      Lab Results  Component Value Date   HEPBSAG NON REACTIVE 09/20/2019   HEPBSAB NON REACTIVE 09/20/2019   HEPBIGM NON REACTIVE 09/20/2019      Microbiology:  Recent Results (from the past 240 hour(s))  Respiratory Panel by RT PCR (Flu A&B, Covid) - Nasopharyngeal Swab     Status: None   Collection Time: 09/20/19 11:13 AM   Specimen: Nasopharyngeal Swab  Result Value Ref Range Status   SARS Coronavirus 2 by RT PCR NEGATIVE NEGATIVE Final    Comment: (  NOTE) SARS-CoV-2 target nucleic acids are NOT DETECTED. The SARS-CoV-2 RNA is generally detectable in upper respiratoy specimens during the acute phase of infection. The lowest concentration of SARS-CoV-2 viral copies this assay can detect is 131 copies/mL. A negative result does not preclude SARS-Cov-2 infection and should not be used as the sole basis for treatment or other patient management decisions. A negative result may occur with  improper specimen collection/handling, submission of specimen other than nasopharyngeal swab, presence of viral mutation(s) within the areas targeted by this assay, and inadequate number of viral copies (<131 copies/mL). A negative result must be combined with clinical observations, patient history, and epidemiological information. The expected result is Negative. Fact Sheet for Patients:  PinkCheek.be Fact Sheet for Healthcare Providers:  GravelBags.it This test is not yet ap proved or cleared by the [place] FDA and  has been authorized for detection and/or diagnosis of SARS-CoV-2 by FDA under an Emergency Use Authorization (EUA). This EUA will remain  in effect  (meaning this test can be used) for the duration of the COVID-19 declaration under Section 564(b)(1) of the Act, 21 U.S.C. section 360bbb-3(b)(1), unless the authorization is terminated or revoked sooner.    Influenza A by PCR NEGATIVE NEGATIVE Final   Influenza B by PCR NEGATIVE NEGATIVE Final    Comment: (NOTE) The Xpert Xpress SARS-CoV-2/FLU/RSV assay is intended as an aid in  the diagnosis of influenza from Nasopharyngeal swab specimens and  should not be used as a sole basis for treatment. Nasal washings and  aspirates are unacceptable for Xpert Xpress SARS-CoV-2/FLU/RSV  testing. Fact Sheet for Patients: PinkCheek.be Fact Sheet for Healthcare Providers: GravelBags.it This test is not yet approved or cleared by the [place] FDA and  has been authorized for detection and/or diagnosis of SARS-CoV-2 by  FDA under an Emergency Use Authorization (EUA). This EUA will remain  in effect (meaning this test can be used) for the duration of the  Covid-19 declaration under Section 564(b)(1) of the Act, 21  U.S.C. section 360bbb-3(b)(1), unless the authorization is  terminated or revoked. Performed at [facility], [address] [place], [place] [address]   Expectorated sputum assessment w rflx to resp cult     Status: None   Collection Time: 09/29/19 11:07 AM   Specimen: Sputum  Result Value Ref Range Status   Specimen Description SPUTUM  Final   Special Requests NONE  Final   Sputum evaluation   Final    THIS SPECIMEN IS ACCEPTABLE FOR SPUTUM CULTURE Performed at [facility], [address] [place], [place] [address]    Report Status 09/29/2019 FINAL  Final    Coagulation Studies: No results for input(s): LABPROT, INR in the last 72 hours.  Urinalysis: No results for input(s): COLORURINE, LABSPEC, PHURINE, GLUCOSEU, HGBUR, BILIRUBINUR, KETONESUR, PROTEINUR, UROBILINOGEN, NITRITE, LEUKOCYTESUR  in the last 72 hours.  Invalid input(s): APPERANCEUR    Imaging: DG Chest Port 1 View  Result Date: 09/29/2019 CLINICAL DATA:  Hyperlipidemia, hypertension, AAA. Acute kidney injury. EXAM: PORTABLE CHEST 1 VIEW COMPARISON:  Sep 25, 2019 FINDINGS: There has been interval placement of dialysis catheter terminating at the cavoatrial junction and right atrium. The cardiac silhouette is partially obscured by moderate to large bilateral pleural effusions. Moderate mixed pattern interstitial pulmonary edema. Lower lobe airspace consolidation cannot be excluded. No pneumothorax. Calcific atherosclerotic disease of the aorta. Osseous structures are without acute abnormality. Soft tissues are grossly normal. IMPRESSION: Mixed pattern interstitial pulmonary edema with moderate to large bilateral  pleural effusions. Lower lobe atelectasis versus airspace consolidation. Electronically Signed   By: Fidela Salisbury M.D.   On: 09/29/2019 12:23     Medications:    . amLODipine  10 mg Oral Daily  . Chlorhexidine Gluconate Cloth  6 each Topical Daily  . feeding supplement (NEPRO CARB STEADY)  237 mL Oral TID BM  . heparin injection (subcutaneous)  5,000 Units Subcutaneous Q8H  . hydrALAZINE  75 mg Oral TID  . labetalol  100 mg Oral BID  . levofloxacin  500 mg Oral Q48H  . mirtazapine  15 mg Oral QHS  . multivitamin  1 tablet Oral QHS  . nicotine  21 mg Transdermal Daily  . torsemide  20 mg Oral Daily   acetaminophen, bisacodyl, ipratropium-albuterol, labetalol, ondansetron **OR** ondansetron (ZOFRAN) IV, ondansetron (ZOFRAN) IV, sodium chloride  Assessment/ Plan:  74 y.o. 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 Acute renal failure, secondary to  cholesterol embolic disease and acute tubular necrosis on renal biopsy. 2. Chronic kidney disease stage IIIA: baseline creatinine of 1.28, GFR of 56 on 08/12/19;  possibly new baseline 1.66 on August 28, 2019 3. Proteinuria 4.  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 Anemia with renal failure 6. AAA  6 cm.  Status post endovascular repair 3/25.  Complicated by embolization of both great toes.  Post procedure cr 1.66 on Apr 7 ; Repeat IV contrast exposure 75 cc on April 7 for CT angiogram 7.  Hypokalemia 8.  Hypertension   Plan:  Renal function was worse today as BUN was up to 72 with a creatinine of 6.75.  He was also having significantly poor appetite.  Urine output yesterday was only 575 cc.  Therefore we decided upon dialysis treatment today.  Seen and evaluated during the dialysis treatment and appears to be tolerating well.  Next week we will continue to work towards placement at transitional care unit for hemodialysis.  Otherwise plan as patient progresses.   Results for DABID, [place] (MRN [id number]) as of 09/24/2019 13:28  Ref. Range 09/20/2019 18:08  SEE BELOW Unknown Comment  Anti Nuclear Antibody (ANA) Latest Ref Range: Negative  Positive (A)  ds DNA Ab Latest Ref Range: 0 - 9 IU/mL <1  GBM Ab Latest Ref Range: 0 - 20 units 3  ENA SM Ab Ser-aCnc Latest Ref Range: 0.0 - 0.9 AI <0.2  C3 Complement Latest Ref Range: 82 - 167 mg/dL 129  Complement C4, Body Fluid Latest Ref Range: 12 - 38 mg/dL 26  Kappa free light chain Latest Ref Range: 3.3 - 19.4 mg/L 104.7 (H)  Lamda free light chains Latest Ref Range: 5.7 - 26.3 mg/L 74.4 (H)  Kappa, lamda light chain ratio Latest Ref Range: 0.26 - 1.65  1.41  Ribonucleic Protein Latest Ref Range: 0.0 - 0.9 AI 0.2  SSA (Ro) (ENA) Antibody, IgG Latest Ref Range: 0.0 - 0.9 AI <0.2  SSB (La) (ENA) Antibody, IgG Latest Ref Range: 0.0 - 0.9 AI <0.2    LOS: 9 Joscelin Fray 5/9/20213:47 PM  [place],  [place]  Note: This note was prepared with Dragon dictation. Any transcription errors are unintentional

## 2019-09-29 NOTE — Progress Notes (Signed)
[facility] NAME: Scott Oconnor    MR#:  [id number]  DATE OF BIRTH:  May 21, 1946  SUBJECTIVE:  patient appears quite fatigued and sleeping. Daughter at bedside reports patient started hallucinating after getting Ambien last night. Did not sleep well. Has been eating okay per daughter.  Patient's saturations dropped into the 80s on 1 L. Currently 9291% on 3 L. Coughing up yellow phlegm with low-grade fever of 99. REVIEW OF SYSTEMS:   Review of Systems  Constitutional: Positive for malaise/fatigue. Negative for chills, fever and weight loss.  HENT: Negative for ear discharge, ear pain and nosebleeds.   Eyes: Negative for blurred vision, pain and discharge.  Respiratory: Positive for shortness of breath. Negative for sputum production, wheezing and stridor.   Cardiovascular: Negative for chest pain, palpitations, orthopnea and PND.  Gastrointestinal: Negative for abdominal pain, diarrhea and vomiting.  Genitourinary: Negative for frequency and urgency.  Musculoskeletal: Negative for back pain and joint pain.  Neurological: Positive for weakness. Negative for sensory change, speech change and focal weakness.  Psychiatric/Behavioral: Negative for depression and hallucinations. The patient is nervous/anxious.    Tolerating Diet:yes Tolerating PT: pt ambulatory normally but weak today  DRUG ALLERGIES:  No Known Allergies  VITALS:  Blood pressure (!) 153/70, pulse 86, temperature 98.5 F (36.9 C), temperature source Oral, resp. rate 20, height 5\' 10"  (1.778 m), weight 53.5 kg, SpO2 91 %.  PHYSICAL EXAMINATION:   Physical Exam  GENERAL:  74 y.o.-year-old patient lying in the bed with no acute distress.  Thin, deconditoned EYES: Pupils equal, round, reactive to light and accommodation. No scleral icterus.   HEENT: Head atraumatic, normocephalic. Oropharynx and nasopharynx clear.  NECK:  Supple, no jugular venous distention. No  thyroid enlargement, no tenderness.  LUNGS:decreased breath sounds bilaterally, no wheezing, rales, rhonchi. No use of accessory muscles of respiration. Perm cath+ CARDIOVASCULAR: S1, S2 normal. No murmurs, rubs, or gallops.  ABDOMEN: Soft, nontender, nondistended. Bowel sounds present. No organomegaly or mass.  EXTREMITIES: No cyanosis, clubbing or edema b/l.    NEUROLOGIC: Cranial nerves II through XII are intact. No focal Motor or sensory deficits b/l.   PSYCHIATRIC:  patient is alert and oriented x 3.  SKIN: No obvious rash, lesion, or ulcer.   LABORATORY PANEL:  CBC Recent Labs  Lab 09/29/19 0642  WBC 10.1  HGB 10.4*  HCT 32.1*  PLT 252    Chemistries  Recent Labs  Lab 09/29/19 0642  NA 142  K 3.8  CL 104  CO2 25  GLUCOSE 152*  BUN 72*  CREATININE 6.75*  CALCIUM 8.6*   Cardiac Enzymes No results for input(s): TROPONINI in the last 168 hours. RADIOLOGY:  DG Chest Port 1 View  Result Date: 09/29/2019 CLINICAL DATA:  Hyperlipidemia, hypertension, AAA. Acute kidney injury. EXAM: PORTABLE CHEST 1 VIEW COMPARISON:  Sep 25, 2019 FINDINGS: There has been interval placement of dialysis catheter terminating at the cavoatrial junction and right atrium. The cardiac silhouette is partially obscured by moderate to large bilateral pleural effusions. Moderate mixed pattern interstitial pulmonary edema. Lower lobe airspace consolidation cannot be excluded. No pneumothorax. Calcific atherosclerotic disease of the aorta. Osseous structures are without acute abnormality. Soft tissues are grossly normal. IMPRESSION: Mixed pattern interstitial pulmonary edema with moderate to large bilateral pleural effusions. Lower lobe atelectasis versus airspace consolidation. Electronically Signed   By: Fidela Salisbury M.D.   On: 09/29/2019 12:23   ASSESSMENT AND PLAN:  Scott Oconnor  is  a 74 y.o. male presenting to the hospital with some swelling in his feet and hands.  He is also been fatigued and  nauseous.  He falls asleep easily.  He had a CT scan with contrast on April 7 and has not been feeling well since He had a recent AAA surgery on March 24.  Patient also having 10 to 15 pound weight loss and poor appetite  1. 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 [name] nephrology recommends start HD--#1 rx on 09/27/2019  2. Acute hypoxic respiratory failure secondary to mild pulmonary edema right lower lobe with COPD (noted on CXR) and ongoing tobacco abuse -no evidence of infection -CXR s/o fluid bilaterally with pleural effusion -send sputum c/s, BNP -5/9>> levaquin empiric -white count normal, no fever, Pro calcitonin 0.12 --torsemide 20 mg qd per dr [name] -oxygen 90-91% on 3 liters  -Echo shows EF 60-65%  3. Hypertension-- uncontrolled -discontinued clonidine given increased fatigue ability, mild bradycardia -continue labetalol , hydralazine and amlodipine -increased hydralazine to 75 mg tid -echo results Mild LVH with Moderate TR. EF 60-65%  4. History of AAA repair recent and PVD -followed by Dr Delana Meyer  5. Anemia due to chronic kidney disease -hemoglobin stable   6. Mild hypokalemia  7.Nutrition Status: Nutrition Problem: Severe Malnutrition Etiology: social / environmental circumstances(inadequate oral intake) Signs/Symptoms: severe fat depletion, severe muscle depletion Interventions: Refer to RD note for recommendations   procedures: perm cath placement, Initiation of HD Family communication : discussed with dter [name] in the room Consults : nephrology CODE STATUS: FULL DVT Prophylaxis :heparin  Status is:  Inpatient  Remains inpatient appropriate because: plans for starting hemodialysis today (May 7th) s/p placement of perm cath per nephrology. Nephrology needs to decide pt's HD schedule Has hypoxia with pulm edema,?pneumonia   Dispo: The patient is from: Home              Anticipated d/c is to: Home              Anticipated d/c date is: > 3 days              Patient currently is not medically stable to d/c.  TOTAL TIME TAKING CARE OF THIS PATIENT: 30 minutes.  >50% time spent on counselling and coordination of care  Note: This dictation was prepared with Dragon dictation along with smaller phrase technology. Any transcriptional errors that result from this process are unintentional.  Fritzi Mandes M.D    [facility]   CC: Primary care physician; Verl Bangs, FNPPatient ID: Scott Oconnor, male   DOB: Dec 24, 1945, 74 y.o.   MRN: [id number]

## 2019-09-29 NOTE — Progress Notes (Signed)
Notified Dr Posey Pronto of last VS.  O2 81% on 1L O2 per [place].  Now on 3L with O2 sats 90%.  Shallow breathing.  BP 199/80, BP meds will be given.  Temp 99.2.  Encouraged to cough, deep breathing and use of incentive spirometer.  MD to place orders.

## 2019-09-29 NOTE — Progress Notes (Signed)
Post Hd   

## 2019-09-30 ENCOUNTER — Other Ambulatory Visit: Payer: Self-pay | Admitting: Nurse Practitioner

## 2019-09-30 DIAGNOSIS — E782 Mixed hyperlipidemia: Secondary | ICD-10-CM

## 2019-09-30 DIAGNOSIS — E43 Unspecified severe protein-calorie malnutrition: Secondary | ICD-10-CM

## 2019-09-30 LAB — QUANTIFERON-TB GOLD PLUS (RQFGPL)
QuantiFERON Mitogen Value: 1.13 IU/mL
QuantiFERON Nil Value: 0 IU/mL
QuantiFERON TB1 Ag Value: 0.05 IU/mL
QuantiFERON TB2 Ag Value: 0.05 IU/mL

## 2019-09-30 LAB — BASIC METABOLIC PANEL
Anion gap: 14 (ref 5–15)
BUN: 54 mg/dL — ABNORMAL HIGH (ref 8–23)
CO2: 26 mmol/L (ref 22–32)
Calcium: 8.4 mg/dL — ABNORMAL LOW (ref 8.9–10.3)
Chloride: 100 mmol/L (ref 98–111)
Creatinine, Ser: 5.14 mg/dL — ABNORMAL HIGH (ref 0.61–1.24)
GFR calc Af Amer: 12 mL/min — ABNORMAL LOW (ref >60)
GFR calc non Af Amer: 10 mL/min — ABNORMAL LOW (ref >60)
Glucose, Bld: 135 mg/dL — ABNORMAL HIGH (ref 70–99)
Potassium: 3.5 mmol/L (ref 3.5–5.1)
Sodium: 140 mmol/L (ref 135–145)

## 2019-09-30 LAB — QUANTIFERON-TB GOLD PLUS: QuantiFERON-TB Gold Plus: NEGATIVE

## 2019-09-30 LAB — PHOSPHORUS: Phosphorus: 5.1 mg/dL — ABNORMAL HIGH (ref 2.5–4.6)

## 2019-09-30 MED ORDER — HYDRALAZINE HCL 100 MG PO TABS: 100.0000 mg | ORAL_TABLET | Freq: Three times a day (TID) | ORAL | 3 refills | Status: DC

## 2019-09-30 MED ORDER — TORSEMIDE 20 MG PO TABS: 20.0000 mg | ORAL_TABLET | Freq: Every day | ORAL | 1 refills | Status: DC

## 2019-09-30 MED ORDER — HYDRALAZINE HCL 25 MG PO TABS: 25.0000 mg | ORAL_TABLET | Freq: Three times a day (TID) | ORAL | 0 refills | Status: DC | PRN

## 2019-09-30 MED ORDER — AMLODIPINE BESYLATE 10 MG PO TABS: 10.0000 mg | ORAL_TABLET | Freq: Every day | ORAL | 2 refills | Status: DC

## 2019-09-30 MED ORDER — NICOTINE 21 MG/24HR TD PT24: 21.0000 mg | MEDICATED_PATCH | Freq: Every day | TRANSDERMAL | 0 refills | Status: DC

## 2019-09-30 MED ORDER — HYDRALAZINE HCL 25 MG PO TABS: 25.0000 mg | ORAL_TABLET | Freq: Three times a day (TID) | ORAL | Status: DC | PRN

## 2019-09-30 MED ORDER — LEVOFLOXACIN 500 MG PO TABS: 500.0000 mg | ORAL_TABLET | ORAL | 0 refills | Status: AC

## 2019-09-30 MED ORDER — HYDRALAZINE HCL 50 MG PO TABS
100.0000 mg | ORAL_TABLET | Freq: Three times a day (TID) | ORAL | Status: DC
  Administered 2019-09-30: 100 mg via ORAL
  Filled 2019-09-30 (×2): qty 2

## 2019-09-30 MED ORDER — LABETALOL HCL 100 MG PO TABS: 100.0000 mg | ORAL_TABLET | Freq: Two times a day (BID) | ORAL | 1 refills | Status: DC

## 2019-09-30 MED ORDER — MIRTAZAPINE 15 MG PO TABS: 15.0000 mg | ORAL_TABLET | Freq: Every day | ORAL | 0 refills | Status: DC

## 2019-09-30 MED ORDER — RENA-VITE PO TABS: 1.0000 | ORAL_TABLET | Freq: Every day | ORAL | 2 refills | Status: AC

## 2019-09-30 MED ORDER — LEVOFLOXACIN 500 MG PO TABS: 500.0000 mg | ORAL_TABLET | ORAL | Status: DC

## 2019-09-30 NOTE — Care Management Important Message (Signed)
Important Message  Patient Details  Name: Scott Oconnor MRN: [id number] Date of Birth: 12-10-1945   Medicare Important Message Given:  Yes     [name] 09/30/2019, 11:22 AM

## 2019-09-30 NOTE — Progress Notes (Signed)
   09/30/19 1300  Hand-Off documentation  Handoff Given Given to shift RN/LPN  Report given to (Full Name) candace summer  rn  Handoff Received Received from shift RN/LPN  Report received from (Full Name) Heith Haigler rn  Vital Signs  Temp 98.3 F (36.8 C)  Temp Source Oral  Pulse Rate (!) 48  Resp 20  BP (!) 188/88  BP Location Left Arm  BP Method Automatic  Patient Position (if appropriate) Lying  Oxygen Therapy  SpO2 98 %  O2 Device Nasal Cannula  O2 Flow Rate (L/min) 3 L/min  Pain Assessment  Pain Scale 0-10  Pain Score 0  During Hemodialysis Assessment  Blood Flow Rate (mL/min) 300 mL/min  Arterial Pressure (mmHg) -110 mmHg  Venous Pressure (mmHg) 80 mmHg  Transmembrane Pressure (mmHg) 60 mmHg  Ultrafiltration Rate (mL/min) 330 mL/min  Dialysate Flow Rate (mL/min) 600 ml/min  Conductivity: Machine  14  HD Safety Checks Performed Yes  KECN 51.6 KECN  Dialysis Fluid Bolus Normal Saline  Bolus Amount (mL) 250 mL  Intra-Hemodialysis Comments Progressing as prescribed;See progress note;Tx completed  Post-Hemodialysis Assessment  Rinseback Volume (mL) 250 mL  KECN 51.6 V  Dialyzer Clearance Lightly streaked  Duration of HD Treatment -hour(s) 3.5 hour(s)  Hemodialysis Intake (mL) 500 mL  UF Total -Machine (mL) 1000 mL  Net UF (mL) 500 mL  Tolerated HD Treatment Yes  Post-Hemodialysis Comments pt stable alert hig b/p noted  Education / Care Plan  Dialysis Education Provided Yes  Hemodialysis Catheter Right Double lumen Permanent (Tunneled)  Placement Date/Time: 09/26/19 1200   Time Out: Correct patient;Correct site;Correct procedure  Maximum sterile barrier precautions: Hand hygiene;Mask;Cap;Sterile gown;Sterile gloves;Large sterile sheet  Site Prep: Chlorhexidine (preferred)  Local Anes...  Site Condition No complications  Blue Lumen Status Flushed;Heparin locked  Red Lumen Status Flushed;Heparin locked  Purple Lumen Status N/A  Catheter fill solution Heparin  1000 units/ml  Catheter fill volume (Arterial) 2.2 cc  Catheter fill volume (Venous) 2.2  Dressing Type Occlusive  Dressing Status Clean;Dry;Intact  Interventions Dressing changed  Drainage Description None  Dressing Change Due 10/07/19  Post treatment catheter status Capped and Clamped  Treatment completed no s/s of distress to note high b/p noted primary made aware, 537ml removed

## 2019-09-30 NOTE — Progress Notes (Signed)
Patient blood pressure came down to 162/90

## 2019-09-30 NOTE — TOC Progression Note (Signed)
Transition of Care [facility]) - Progression Note    Patient Details  Name: Scott Oconnor MRN: [id number] Date of Birth: 03/04/1946  [facility]) CM/SW Contact  Emanie Behan, Gardiner Rhyme, LCSW Phone Number: 09/30/2019, 10:23 AM  Clinical Narrative:  Estill Bamberg working on getting pt set up with OP-HD. She met with daughter Friday. Will let MD know when arrangements set up.     Expected Discharge Plan: Home/Self Care Barriers to Discharge: Continued Medical Work up  Expected Discharge Plan and Services Expected Discharge Plan: Home/Self Care In-house Referral: Clinical Social Work   Post Acute Care Choice: Dialysis Living arrangements for the past 2 months: Single Family Home                                       Social Determinants of Health (SDOH) Interventions    Readmission Risk Interventions No flowsheet data found.

## 2019-09-30 NOTE — TOC Transition Note (Signed)
Transition of Care [facility]) - CM/SW Discharge Note   Patient Details  Name: Scott Oconnor MRN: [id number] Date of Birth: April 26, 1946  [facility]) CM/SW Contact:  Elease Hashimoto, LCSW Phone Number: 09/30/2019, 3:50 PM   Clinical Narrative:  Pt to discharge home today via MD. Have placed order for home O2 and justification placed in chart. Have made referral to [employer] via brad who is aware and will bring O2 to pt's room. Daughter and pt aware of the plan and has no other needs. No further follow due to DC today.    Final next level of care: Home/Self Care Barriers to Discharge: Barriers Resolved   Patient Goals and CMS Choice Patient states their goals for this hospitalization and ongoing recovery are:: I want to be better and off oxygen      Discharge Placement                Patient to be transferred to facility by: HOme via car by family Name of family member notified: Tanya-daughter Patient and family notified of of transfer: 09/30/19  Discharge Plan and Services In-house Referral: Clinical Social Work   Post Acute Care Choice: Dialysis          DME Arranged: Oxygen DME Agency: [employer] Date DME Agency Contacted: 09/30/19 Time DME Agency Contacted: 2000 Representative spoke with at DME Agency: brad            Social Determinants of Health ([place]) Interventions     Readmission Risk Interventions No flowsheet data found.

## 2019-09-30 NOTE — Progress Notes (Signed)
   09/30/19 1341  Assess: MEWS Score  Temp 98.8 F (37.1 C)  BP (!) 202/82  Pulse Rate 98  Resp 18  Level of Consciousness Alert  SpO2 91 %  O2 Device Nasal Cannula  O2 Flow Rate (L/min) 1 L/min  Assess: MEWS Score  MEWS Temp 0  MEWS Systolic 2  MEWS Pulse 0  MEWS RR 0  MEWS LOC 0  MEWS Score 2  MEWS Score Color Yellow  Assess: if the MEWS score is Yellow or Red  Were vital signs taken at a resting state? Yes  Focused Assessment Documented focused assessment  Early Detection of Sepsis Score *See Row Information* Low  MEWS guidelines implemented *See Row Information* Yes  Treat  MEWS Interventions Administered scheduled meds/treatments  Take Vital Signs  Increase Vital Sign Frequency  Yellow: Q 2hr X 2 then Q 4hr X 2, if remains yellow, continue Q 4hrs  Escalate  MEWS: Escalate Yellow: discuss with charge nurse/RN and consider discussing with provider and RRT  Notify: Charge Nurse/RN  Name of Charge Nurse/RN Notified Chris, RN  Date Charge Nurse/RN Notified 09/30/19  Time Charge Nurse/RN Notified 75 (States that Candace already told him prior to me calling him)  Notify: Provider  Provider Name/Title Posey Pronto  Date Provider Notified 09/30/19  Time Provider Notified 1350  Notification Type Page  Notification Reason Other (Comment) (BP)  Response Other (Comment) (give anti-hypertensives)  Date of Provider Response 09/30/19  Time of Provider Response 1400

## 2019-09-30 NOTE — Progress Notes (Signed)
Patient is stable and ready for discharge. Patient's IV removed. Writer went over discharge paperwork with patient and daughter Kenney Houseman all questions and concerned addressed and both verbalized understanding. Discharge packet given to daughter. Patient's belongings packed and sent home with wife. Patient's daughter will be transporting patient home via private car. Patient sent with his home O2, he has [place] in place on 2L no s/s of distress. Nursing tech transported patient via [place] to daughter's car.

## 2019-09-30 NOTE — Progress Notes (Signed)
[place], [place] 09/30/19  Subjective:   Hospital day # 10 Patient seen and evaluated during hemodialysis treatment. Tolerating well. Urine output yesterday was 645 cc.  Objective:  Vital signs in last 24 hours:  Temp:  [97.7 F (36.5 C)-99.3 F (37.4 C)] 97.9 F (36.6 C) (05/10 0945) Pulse Rate:  [22-98] 48 (05/10 1300) Resp:  [16-24] 20 (05/10 1300) BP: (129-195)/(63-88) 188/88 (05/10 1300) SpO2:  [90 %-98 %] 98 % (05/10 1300)  Weight change:  Filed Weights   09/20/19 1055 09/26/19 1035 09/27/19 0940  Weight: 53.5 kg 53.5 kg 53.5 kg    Intake/Output:    Intake/Output Summary (Last 24 hours) at 09/30/2019 1322 Last data filed at 09/30/2019 6283 Gross per 24 hour  Intake --  Output 895 ml  Net -895 ml    Gen:   No acute distress Head:   Normocephalic, atraumatic Eyes/ENT:  Anicteric Neck:  Supple Lungs:   Normal respiratory effort, clear bilateral Heart:   Regular rate and rhythm Abdomen:   Soft, non-tender, bowel sounds present Extremities: trace lower extremity edema Skin:  No rashes Neurologic: Alert, able to follow commands Right IJ PermCath placement 11/10/2019     Basic Metabolic Panel:  Recent Labs  Lab 09/23/19 1801 09/24/19 0445 09/26/19 0420 09/26/19 0420 09/27/19 0428 09/27/19 0428 09/28/19 0427 09/29/19 0642 09/30/19 0411  NA 137   < > 135  --  140  --  142 142 140  K 3.4*   < > 3.3*  --  3.9  --  3.8 3.8 3.5  CL 106   < > 106  --  109  --  105 104 100  CO2 20*   < > 18*  --  18*  --  24 25 26   GLUCOSE 168*   < > 139*  --  154*  --  154* 152* 135*  BUN 62*   < > 64*  --  73*  --  54* 72* 54*  CREATININE 5.85*   < > 6.60*  --  7.14*  --  5.83* 6.75* 5.14*  CALCIUM 8.3*   < > 8.2*   < > 8.6*   < > 8.5* 8.6* 8.4*  PHOS 4.6  --   --   --   --   --   --   --  5.1*   < > = values in this interval not displayed.     CBC: Recent Labs  Lab 09/24/19 0445 09/24/19 1707 09/25/19 0614 09/29/19 0642  WBC   --   --  9.2 10.1  HGB 8.9* 9.7* 9.5* 10.4*  HCT  --   --  28.1* 32.1*  MCV  --   --  92.7 96.7  PLT  --   --  188 252      Lab Results  Component Value Date   HEPBSAG NON REACTIVE 09/20/2019   HEPBSAB NON REACTIVE 09/20/2019   HEPBIGM NON REACTIVE 09/20/2019      Microbiology:  Recent Results (from the past 240 hour(s))  Expectorated sputum assessment w rflx to resp cult     Status: None   Collection Time: 09/29/19 11:07 AM   Specimen: Sputum  Result Value Ref Range Status   Specimen Description SPUTUM  Final   Special Requests NONE  Final   Sputum evaluation   Final    THIS SPECIMEN IS ACCEPTABLE FOR SPUTUM CULTURE Performed at [facility], [address] [place], [place] [address]  Report Status 09/29/2019 FINAL  Final  Culture, respiratory     Status: None (Preliminary result)   Collection Time: 09/29/19 11:07 AM   Specimen: SPU  Result Value Ref Range Status   Specimen Description   Final    SPUTUM Performed at [facility], [address], [place], [place] [address]    Special Requests   Final    NONE Reflexed from [id number] Performed at [facility], [address] [place], [place] [address]    Gram Stain   Final    FEW WBC PRESENT, PREDOMINANTLY PMN FEW GRAM POSITIVE COCCI FEW GRAM NEGATIVE RODS FEW GRAM POSITIVE RODS Performed at [facility], [place] [address] [place], [place] [address]    Culture PENDING  Incomplete   Report Status PENDING  Incomplete    Coagulation Studies: No results for input(s): LABPROT, INR in the last 72 hours.  Urinalysis: No results for input(s): COLORURINE, LABSPEC, PHURINE, GLUCOSEU, HGBUR, BILIRUBINUR, KETONESUR, PROTEINUR, UROBILINOGEN, NITRITE, LEUKOCYTESUR in the last 72 hours.  Invalid input(s): APPERANCEUR    Imaging: DG Chest Port 1 View  Result Date: 09/29/2019 CLINICAL DATA:  Hyperlipidemia, hypertension, AAA. Acute kidney injury. EXAM: PORTABLE CHEST 1 VIEW COMPARISON:   Sep 25, 2019 FINDINGS: There has been interval placement of dialysis catheter terminating at the cavoatrial junction and right atrium. The cardiac silhouette is partially obscured by moderate to large bilateral pleural effusions. Moderate mixed pattern interstitial pulmonary edema. Lower lobe airspace consolidation cannot be excluded. No pneumothorax. Calcific atherosclerotic disease of the aorta. Osseous structures are without acute abnormality. Soft tissues are grossly normal. IMPRESSION: Mixed pattern interstitial pulmonary edema with moderate to large bilateral pleural effusions. Lower lobe atelectasis versus airspace consolidation. Electronically Signed   By: Fidela Salisbury M.D.   On: 09/29/2019 12:23     Medications:    . amLODipine  10 mg Oral Daily  . Chlorhexidine Gluconate Cloth  6 each Topical Daily  . feeding supplement (NEPRO CARB STEADY)  237 mL Oral TID BM  . heparin injection (subcutaneous)  5,000 Units Subcutaneous Q8H  . hydrALAZINE  100 mg Oral TID  . labetalol  100 mg Oral BID  . [START ON 10/01/2019] levofloxacin  500 mg Oral Q48H  . mirtazapine  15 mg Oral QHS  . multivitamin  1 tablet Oral QHS  . nicotine  21 mg Transdermal Daily  . torsemide  20 mg Oral Daily   acetaminophen, bisacodyl, ipratropium-albuterol, labetalol, ondansetron **OR** ondansetron (ZOFRAN) IV, ondansetron (ZOFRAN) IV, sodium chloride  Assessment/ Plan:  74 y.o. 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 2. Chronic kidney disease stage IIIA: baseline creatinine of 1.28, GFR of 56 on 08/12/19;  possibly new baseline 1.66 on August 28, 2019 3.  Proteinuria 4.  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 Anemia with renal failure 6. AAA  6 cm.  Status post endovascular repair 3/25.  Complicated by embolization of both great toes.  Post procedure cr 1.66 on Apr 7 ; Repeat IV contrast exposure 75 cc on April 7 for CT angiogram 7.  Hypokalemia 8.  Hypertension   Plan:  Patient seen and evaluated during hemodialysis treatment today.  Tolerating well thus  far.  We will plan for dialysis treatment tomorrow as well.  Outpatient hemodialysis center placement pending.  Overall does appear a bit better today as compared to over the weekend.  Further plan as patient progresses.   Results for JEDREK, DINOVO (MRN [id number]) as of 09/24/2019 13:28  Ref. Range 09/20/2019 18:08  SEE BELOW Unknown Comment  Anti Nuclear Antibody (ANA) Latest Ref Range: Negative  Positive (A)  ds DNA Ab Latest Ref Range: 0 - 9 IU/mL <1  GBM Ab Latest Ref Range: 0 - 20 units 3  ENA SM Ab Ser-aCnc Latest Ref Range: 0.0 - 0.9 AI <0.2  C3 Complement Latest Ref Range: 82 - 167 mg/dL 129  Complement C4, Body Fluid Latest Ref Range: 12 - 38 mg/dL 26  Kappa free light chain Latest Ref Range: 3.3 - 19.4 mg/L 104.7 (H)  Lamda free light chains Latest Ref Range: 5.7 - 26.3 mg/L 74.4 (H)  Kappa, lamda light chain ratio Latest Ref Range: 0.26 - 1.65  1.41  Ribonucleic Protein Latest Ref Range: 0.0 - 0.9 AI 0.2  SSA (Ro) (ENA) Antibody, IgG Latest Ref Range: 0.0 - 0.9 AI <0.2  SSB (La) (ENA) Antibody, IgG Latest Ref Range: 0.0 - 0.9 AI <0.2    LOS: 10 Iris Hairston 5/10/20211:22 PM  [place], [place]  Note: This note was prepared with Dragon dictation. Any transcription errors are unintentional

## 2019-09-30 NOTE — Progress Notes (Signed)
90% lying without O2 86% standing without O2 82% walking without O2  Went 92% when put on 2L

## 2019-09-30 NOTE — Discharge Instructions (Signed)
Goal for your hemodialysis Tuesday Thursday Saturday at [facility] in [place]

## 2019-09-30 NOTE — Discharge Summary (Signed)
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 Hyperlipidemia   . Hypertension   . PVD (peripheral vascular disease) ([place])   . Reynolds syndrome [facility])     HOSPITAL COURSE:   Scott Oconnor a74 y.o.malepresenting to the hospital with some swelling in his feet and hands. He is also been fatigued and nauseous. He falls asleep easily. He had a CT scan with contrast on April 7 and has not been feeling well since He had a recent AAA surgery on March 24. Patient also having 10 to 15 pound weight loss and poor appetite  1. 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 Candiss Oconnor nephrology recommends start HD--# 3 rx on 09/30/2019  2. Acute hypoxic respiratory failure secondary to mild pulmonary edema right lower lobe with  COPD (noted on CXR) and ongoing tobacco abuse and Acute CHF diastolic -CXR s/o fluid bilaterally with pleural effusion - sputum c/s few GNR/GPC--empirc levaquin (5 doses)- BNP--elevated 1900 -5/9>> levaquin empiric -white count normal, no fever, Pro calcitonin 0.12 --torsemide 20 mg qd per Scott Oconnor -oxygen 90-91% on 2 liters  --patient did qualify for home oxygen. -Echo shows EF 60-65%, moderate TR and pulm HTN  3. Hypertension-- uncontrolled -discontinued clonidine given increased fatigue ability, mild bradycardia -continue labetalol , hydralazine and amlodipine -increased hydralazine to 100 mg tid with PRN prescription of hydralazine given -echo results Mild LVH with Moderate TR. EF 60-65%  4. History of AAA repair recent and PVD -followed by Scott Oconnor  5. Anemia due to chronic kidney disease -hemoglobin stable   6. Mild hypokalemia  7.Nutrition Status: Nutrition Problem: Severe Malnutrition Etiology: social / environmental circumstances(inadequate oral intake) Signs/Symptoms: severe fat depletion, severe muscle depletion Interventions: Refer to RD note for recommendations   procedures: perm cath placement, Initiation of HD Family communication : discussed with dter Scott Oconnor in the room Consults : nephrology CODE STATUS: FULL DVT Prophylaxis :heparin  Status is: Inpatient  Dispo: The patient is from: Home  Anticipated d/c is to: Home  Anticipated d/c date is: May10th 2021  Patient currently is medically stable to d/c. CONSULTS OBTAINED:  Treatment Team:  Schnier, Dolores Lory, MD Scott Iba, MD  DRUG ALLERGIES:  No Known Allergies  DISCHARGE MEDICATIONS:   Allergies as of 09/30/2019   No Known Allergies     Medication List    STOP taking these medications   multivitamin capsule     TAKE these medications   acetaminophen  500 MG tablet Commonly known as: TYLENOL Take 500 mg by mouth every 8 (eight) hours as  needed for moderate pain.   amLODipine 10 MG tablet Commonly known as: NORVASC Take 1 tablet (10 mg total) by mouth daily. Start taking on: Oct 01, 2019 What changed:   medication strength  how much to take   aspirin EC 81 MG tablet Take 81 mg by mouth at bedtime.   hydrALAZINE 100 MG tablet Commonly known as: APRESOLINE Take 1 tablet (100 mg total) by mouth 3 (three) times daily.   hydrALAZINE 25 MG tablet Commonly known as: APRESOLINE Take 1 tablet (25 mg total) by mouth every 8 (eight) hours as needed (for BP >170).   labetalol 100 MG tablet Commonly known as: NORMODYNE Take 1 tablet (100 mg total) by mouth 2 (two) times daily.   levofloxacin 500 MG tablet Commonly known as: LEVAQUIN Take 1 tablet (500 mg total) by mouth every other day for 4 doses. Start taking on: Oct 01, 2019   mirtazapine 15 MG tablet Commonly known as: REMERON Take 1 tablet (15 mg total) by mouth at bedtime.   multivitamin Tabs tablet Take 1 tablet by mouth at bedtime.   nicotine 21 mg/24hr patch Commonly known as: NICODERM CQ - dosed in mg/24 hours Place 1 patch (21 mg total) onto the skin daily. Start taking on: Oct 01, 2019   promethazine 12.5 MG tablet Commonly known as: PHENERGAN Take 0.5 tablets (6.25 mg total) by mouth every 8 (eight) hours as needed for nausea or vomiting.   torsemide 20 MG tablet Commonly known as: DEMADEX Take 1 tablet (20 mg total) by mouth daily. Start taking on: Oct 01, 2019            Durable Medical Equipment  (From admission, onward)         Start     Ordered   09/30/19 1529  For home use only DME oxygen  Once    Question Answer Comment  Length of Need 6 Months   Mode or (Route) Nasal cannula   Liters per Minute 2   Frequency Continuous (stationary and portable oxygen unit needed)   Oxygen conserving device Yes   Oxygen delivery system Gas      09/30/19 1528          If you experience worsening of your admission symptoms, develop  shortness of breath, life threatening emergency, suicidal or homicidal thoughts you must seek medical attention immediately by calling 911 or calling your MD immediately  if symptoms less severe.  You Must read complete instructions/literature along with all the possible adverse reactions/side effects for all the Medicines you take and that have been prescribed to you. Take any new Medicines after you have completely understood and accept all the possible adverse reactions/side effects.   Please note  You were cared for by a hospitalist during your hospital stay. If you have any questions about your discharge medications or the care you received while you were in the hospital after you are discharged, you can call the unit and asked to speak with the hospitalist on call if the hospitalist that took care of you is not available. Once you are discharged, your primary care physician will handle any further medical issues. Please note that NO REFILLS for any discharge medications will be authorized once you are discharged, as it is imperative that you return to your primary care physician (or establish a relationship with a primary care physician if you do  not have one) for your aftercare needs so that they can reassess your need for medications and monitor your lab values. Today   SUBJECTIVE    Seen at dialysis and again in the room. He did reset into the 80s with ambulation. Feels weak. Denies chest pain. Overall seems to be improving. VITAL SIGNS:  Blood pressure (!) 202/82, pulse 98, temperature 98.8 F (37.1 C), temperature source Oral, resp. rate 18, height 5\' 10"  (1.778 m), weight 53.5 kg, SpO2 91 %.  I/O:    Intake/Output Summary (Last 24 hours) at 09/30/2019 1534 Last data filed at 09/30/2019 1300 Gross per 24 hour  Intake --  Output 1395 ml  Net -1395 ml    PHYSICAL EXAMINATION:  GENERAL:  74 y.o.-year-old patient lying in the bed with no acute distress. Thin cachectic EYES: Pupils  equal, round, reactive to light and accommodation. No scleral icterus.  HEENT: Head atraumatic, normocephalic. Oropharynx and nasopharynx clear.  NECK:  Supple, no jugular venous distention. No thyroid enlargement, no tenderness.  LUNGS: decreased breath sounds bilaterally, no wheezing, rales,rhonchi or crepitation. No use of accessory muscles of respiration.  CARDIOVASCULAR: S1, S2 normal. No murmurs, rubs, or gallops.  ABDOMEN: Soft, non-tender, non-distended. Bowel sounds present. No organomegaly or mass.  EXTREMITIES: No pedal edema, cyanosis, or clubbing.  NEUROLOGIC: Cranial nerves II through XII are intact. Muscle strength 5/5 in all extremities. Sensation intact. Gait not checked. weak PSYCHIATRIC: The patient is alert and oriented x 3.  SKIN: No obvious rash, lesion, or ulcer.   DATA REVIEW:   CBC  Recent Labs  Lab 09/29/19 0642  WBC 10.1  HGB 10.4*  HCT 32.1*  PLT 252    Chemistries  Recent Labs  Lab 09/30/19 0411  NA 140  K 3.5  CL 100  CO2 26  GLUCOSE 135*  BUN 54*  CREATININE 5.14*  CALCIUM 8.4*    Microbiology Results   Recent Results (from the past 240 hour(s))  Expectorated sputum assessment w rflx to resp cult     Status: None   Collection Time: 09/29/19 11:07 AM   Specimen: Sputum  Result Value Ref Range Status   Specimen Description SPUTUM  Final   Special Requests NONE  Final   Sputum evaluation   Final    THIS SPECIMEN IS ACCEPTABLE FOR SPUTUM CULTURE Performed at [facility], [address] [place], [place] [address]    Report Status 09/29/2019 FINAL  Final  Culture, respiratory     Status: None (Preliminary result)   Collection Time: 09/29/19 11:07 AM   Specimen: SPU  Result Value Ref Range Status   Specimen Description   Final    SPUTUM Performed at [facility], [address], [place], [place] [address]    Special Requests   Final    NONE Reflexed from [phone] Performed at [facility], [address] [place], [place] [address]    Gram Stain   Final    FEW WBC PRESENT, PREDOMINANTLY PMN FEW GRAM POSITIVE COCCI FEW GRAM NEGATIVE RODS FEW GRAM POSITIVE RODS Performed at [facility], [address], [place], [place] [address]    Culture PENDING  Incomplete   Report Status PENDING  Incomplete    RADIOLOGY:  DG Chest Port 1 View  Result Date: 09/29/2019 CLINICAL DATA:  Hyperlipidemia, hypertension, AAA. Acute kidney injury. EXAM: PORTABLE CHEST 1 VIEW COMPARISON:  Sep 25, 2019 FINDINGS: There has been interval placement of dialysis catheter terminating at the cavoatrial  junction and right atrium. The cardiac silhouette is partially obscured by moderate to large bilateral pleural effusions. Moderate mixed pattern interstitial pulmonary edema. Lower lobe airspace consolidation cannot be excluded. No pneumothorax. Calcific atherosclerotic disease of the aorta. Osseous structures are without acute abnormality. Soft tissues are grossly normal. IMPRESSION: Mixed pattern interstitial pulmonary edema with moderate to large bilateral pleural effusions. Lower lobe atelectasis versus airspace consolidation. Electronically Signed   By: Fidela Salisbury M.D.   On: 09/29/2019 12:23     CODE STATUS:     Code Status Orders  (From admission, onward)         Start     Ordered   09/20/19 1416  Full code  Continuous     09/20/19 1416        Code Status History    Date Active Date Inactive Code Status Order ID Comments User Context   08/14/2019 1132 08/15/2019 2113 Full Code [id number]  Schnier, Dolores Lory, MD Inpatient   Advance Care Planning Activity       TOTAL TIME TAKING CARE OF THIS PATIENT: *40* minutes.    Fritzi Mandes M.D  [facility]    CC: Primary care physician; Lorine Bears Lupita Raider, FNP

## 2019-09-30 NOTE — TOC Progression Note (Signed)
Transition of Care [facility]) - Progression Note    Patient Details  Name: Scott Oconnor MRN: [id number] Date of Birth: Dec 28, 1945  [facility]) CM/SW Contact  Hailee Hollick, Gardiner Rhyme, LCSW Phone Number: 09/30/2019, 3:18 PM  Clinical Narrative:  MD reports pt will need home O2-2 liters continuous. She has place order in chart have asked bedside RN to put narrative in. Have contacted [employer] to place order and made aware DC tomorrow. Daughter in room and aware of plan. Amanda-HD navigator working on OP-HD schedule.     Expected Discharge Plan: Home/Self Care Barriers to Discharge: Continued Medical Work up  Expected Discharge Plan and Services Expected Discharge Plan: Home/Self Care In-house Referral: Clinical Social Work   Post Acute Care Choice: Dialysis Living arrangements for the past 2 months: Single Family Home                                       Social Determinants of Health (SDOH) Interventions    Readmission Risk Interventions No flowsheet data found.

## 2019-10-02 ENCOUNTER — Encounter: Payer: Self-pay | Admitting: Nephrology

## 2019-10-02 ENCOUNTER — Telehealth: Payer: Self-pay

## 2019-10-02 LAB — CULTURE, RESPIRATORY W GRAM STAIN

## 2019-10-02 LAB — SURGICAL PATHOLOGY

## 2019-10-02 NOTE — Telephone Encounter (Signed)
Spoke with [name]   Transition Care Management Follow-up Telephone Call  Date of discharge and from where: 09/30/2019 [facility]   How have you been since you were released from the hospital? "he is doing a little better just very weak and tired"  Any questions or concerns? No  Dr.Singh has been a great help and has answered a lot of our questions.   Items Reviewed:  Did the pt receive and understand the discharge instructions provided? Yes   Medications obtained and verified? Yes   Any new allergies since your discharge? No   Dietary orders reviewed? Yes  Do you have support at home? Yes   Functional Questionnaire: (I = Independent and D = Dependent) ADLs:   Bathing/Dressing- d  Meal Prep- d  Eating- i  Maintaining continence- i  Transferring/Ambulation- d  Managing Meds- d  Follow up appointments reviewed:   PCP Hospital f/u appt confirmed? Scott Oconnor will call back to schedule,she is unsure of what his schedule will be next week with diaylsis.   [facility] f/u appt confirmed? Yes, saw Dr.Singh yesterday   Are transportation arrangements needed? No   If their condition worsens, is the pt aware to call PCP or go to the Emergency Dept.? Yes  Was the patient provided with contact information for the PCP's office or ED? Yes  Was to pt encouraged to call back with questions or concerns? Yes

## 2019-11-11 ENCOUNTER — Other Ambulatory Visit (INDEPENDENT_AMBULATORY_CARE_PROVIDER_SITE_OTHER): Payer: Medicare Other

## 2019-11-11 ENCOUNTER — Ambulatory Visit (INDEPENDENT_AMBULATORY_CARE_PROVIDER_SITE_OTHER): Payer: Medicare Other | Admitting: Vascular Surgery

## 2019-11-18 ENCOUNTER — Other Ambulatory Visit: Payer: Self-pay

## 2019-11-18 ENCOUNTER — Emergency Department: Payer: Medicare Other

## 2019-11-18 ENCOUNTER — Inpatient Hospital Stay
Admission: EM | Admit: 2019-11-18 | Discharge: 2019-11-23 | DRG: 291 | Disposition: A | Discharge status: Home / Self Care | Payer: Medicare Other | Attending: Internal Medicine | Admitting: Internal Medicine

## 2019-11-18 ENCOUNTER — Encounter: Payer: Self-pay | Admitting: Emergency Medicine

## 2019-11-18 DIAGNOSIS — J810 Acute pulmonary edema: Secondary | ICD-10-CM | POA: Diagnosis present

## 2019-11-18 DIAGNOSIS — I5031 Acute diastolic (congestive) heart failure: Secondary | ICD-10-CM | POA: Diagnosis not present

## 2019-11-18 DIAGNOSIS — R64 Cachexia: Secondary | ICD-10-CM | POA: Diagnosis present

## 2019-11-18 DIAGNOSIS — J9621 Acute and chronic respiratory failure with hypoxia: Secondary | ICD-10-CM | POA: Diagnosis present

## 2019-11-18 DIAGNOSIS — N2581 Secondary hyperparathyroidism of renal origin: Secondary | ICD-10-CM | POA: Diagnosis present

## 2019-11-18 DIAGNOSIS — I420 Dilated cardiomyopathy: Secondary | ICD-10-CM | POA: Diagnosis present

## 2019-11-18 DIAGNOSIS — I10 Essential (primary) hypertension: Secondary | ICD-10-CM | POA: Diagnosis not present

## 2019-11-18 DIAGNOSIS — D631 Anemia in chronic kidney disease: Secondary | ICD-10-CM | POA: Diagnosis present

## 2019-11-18 DIAGNOSIS — I132 Hypertensive heart and chronic kidney disease with heart failure and with stage 5 chronic kidney disease, or end stage renal disease: Secondary | ICD-10-CM | POA: Diagnosis present

## 2019-11-18 DIAGNOSIS — I6389 Other cerebral infarction: Secondary | ICD-10-CM | POA: Diagnosis not present

## 2019-11-18 DIAGNOSIS — J9601 Acute respiratory failure with hypoxia: Secondary | ICD-10-CM | POA: Diagnosis not present

## 2019-11-18 DIAGNOSIS — I82C11 Acute embolism and thrombosis of right internal jugular vein: Secondary | ICD-10-CM | POA: Diagnosis present

## 2019-11-18 DIAGNOSIS — Z6820 Body mass index (BMI) 20.0-20.9, adult: Secondary | ICD-10-CM

## 2019-11-18 DIAGNOSIS — I248 Other forms of acute ischemic heart disease: Secondary | ICD-10-CM | POA: Diagnosis present

## 2019-11-18 DIAGNOSIS — I639 Cerebral infarction, unspecified: Secondary | ICD-10-CM | POA: Diagnosis not present

## 2019-11-18 DIAGNOSIS — I700 Atherosclerosis of aorta: Secondary | ICD-10-CM | POA: Diagnosis present

## 2019-11-18 DIAGNOSIS — I161 Hypertensive emergency: Secondary | ICD-10-CM | POA: Diagnosis present

## 2019-11-18 DIAGNOSIS — Z8679 Personal history of other diseases of the circulatory system: Secondary | ICD-10-CM

## 2019-11-18 DIAGNOSIS — J9620 Acute and chronic respiratory failure, unspecified whether with hypoxia or hypercapnia: Secondary | ICD-10-CM | POA: Diagnosis not present

## 2019-11-18 DIAGNOSIS — I7589 Atheroembolism of other site: Secondary | ICD-10-CM | POA: Diagnosis present

## 2019-11-18 DIAGNOSIS — J449 Chronic obstructive pulmonary disease, unspecified: Secondary | ICD-10-CM | POA: Diagnosis present

## 2019-11-18 DIAGNOSIS — N179 Acute kidney failure, unspecified: Secondary | ICD-10-CM | POA: Diagnosis present

## 2019-11-18 DIAGNOSIS — I7300 Raynaud's syndrome without gangrene: Secondary | ICD-10-CM | POA: Diagnosis present

## 2019-11-18 DIAGNOSIS — F419 Anxiety disorder, unspecified: Secondary | ICD-10-CM | POA: Diagnosis present

## 2019-11-18 DIAGNOSIS — Z83438 Family history of other disorder of lipoprotein metabolism and other lipidemia: Secondary | ICD-10-CM

## 2019-11-18 DIAGNOSIS — E785 Hyperlipidemia, unspecified: Secondary | ICD-10-CM | POA: Diagnosis present

## 2019-11-18 DIAGNOSIS — F05 Delirium due to known physiological condition: Secondary | ICD-10-CM | POA: Diagnosis not present

## 2019-11-18 DIAGNOSIS — D72828 Other elevated white blood cell count: Secondary | ICD-10-CM | POA: Diagnosis present

## 2019-11-18 DIAGNOSIS — G9341 Metabolic encephalopathy: Secondary | ICD-10-CM | POA: Diagnosis not present

## 2019-11-18 DIAGNOSIS — E872 Acidosis: Secondary | ICD-10-CM | POA: Diagnosis present

## 2019-11-18 DIAGNOSIS — Z9115 Patient's noncompliance with renal dialysis: Secondary | ICD-10-CM

## 2019-11-18 DIAGNOSIS — I5021 Acute systolic (congestive) heart failure: Secondary | ICD-10-CM | POA: Diagnosis not present

## 2019-11-18 DIAGNOSIS — Z72820 Sleep deprivation: Secondary | ICD-10-CM

## 2019-11-18 DIAGNOSIS — I5043 Acute on chronic combined systolic (congestive) and diastolic (congestive) heart failure: Secondary | ICD-10-CM | POA: Diagnosis present

## 2019-11-18 DIAGNOSIS — I5020 Unspecified systolic (congestive) heart failure: Secondary | ICD-10-CM | POA: Diagnosis not present

## 2019-11-18 DIAGNOSIS — N186 End stage renal disease: Secondary | ICD-10-CM | POA: Diagnosis present

## 2019-11-18 DIAGNOSIS — Z992 Dependence on renal dialysis: Secondary | ICD-10-CM | POA: Diagnosis not present

## 2019-11-18 DIAGNOSIS — R29704 NIHSS score 4: Secondary | ICD-10-CM | POA: Diagnosis not present

## 2019-11-18 DIAGNOSIS — R54 Age-related physical debility: Secondary | ICD-10-CM | POA: Diagnosis present

## 2019-11-18 DIAGNOSIS — Z20822 Contact with and (suspected) exposure to covid-19: Secondary | ICD-10-CM | POA: Diagnosis present

## 2019-11-18 DIAGNOSIS — E43 Unspecified severe protein-calorie malnutrition: Secondary | ICD-10-CM | POA: Diagnosis present

## 2019-11-18 DIAGNOSIS — Z9981 Dependence on supplemental oxygen: Secondary | ICD-10-CM

## 2019-11-18 DIAGNOSIS — Z7982 Long term (current) use of aspirin: Secondary | ICD-10-CM

## 2019-11-18 DIAGNOSIS — R7989 Other specified abnormal findings of blood chemistry: Secondary | ICD-10-CM | POA: Diagnosis present

## 2019-11-18 DIAGNOSIS — F17210 Nicotine dependence, cigarettes, uncomplicated: Secondary | ICD-10-CM | POA: Diagnosis present

## 2019-11-18 DIAGNOSIS — E8779 Other fluid overload: Secondary | ICD-10-CM | POA: Diagnosis not present

## 2019-11-18 DIAGNOSIS — Z8241 Family history of sudden cardiac death: Secondary | ICD-10-CM

## 2019-11-18 DIAGNOSIS — Z79899 Other long term (current) drug therapy: Secondary | ICD-10-CM

## 2019-11-18 DIAGNOSIS — E876 Hypokalemia: Secondary | ICD-10-CM | POA: Diagnosis present

## 2019-11-18 DIAGNOSIS — I63413 Cerebral infarction due to embolism of bilateral middle cerebral arteries: Secondary | ICD-10-CM | POA: Diagnosis not present

## 2019-11-18 DIAGNOSIS — J9622 Acute and chronic respiratory failure with hypercapnia: Secondary | ICD-10-CM | POA: Diagnosis present

## 2019-11-18 DIAGNOSIS — Z8249 Family history of ischemic heart disease and other diseases of the circulatory system: Secondary | ICD-10-CM

## 2019-11-18 LAB — CBC WITH DIFFERENTIAL/PLATELET
Abs Immature Granulocytes: 0.06 10*3/uL (ref 0.00–0.07)
Basophils Absolute: 0.1 10*3/uL (ref 0.0–0.1)
Basophils Relative: 1 %
Eosinophils Absolute: 1.8 10*3/uL — ABNORMAL HIGH (ref 0.0–0.5)
Eosinophils Relative: 11 %
HCT: 34.2 % — ABNORMAL LOW (ref 39.0–52.0)
Hemoglobin: 11 g/dL — ABNORMAL LOW (ref 13.0–17.0)
Immature Granulocytes: 0 %
Lymphocytes Relative: 42 %
Lymphs Abs: 6.5 10*3/uL — ABNORMAL HIGH (ref 0.7–4.0)
MCH: 32.9 pg (ref 26.0–34.0)
MCHC: 32.2 g/dL (ref 30.0–36.0)
MCV: 102.4 fL — ABNORMAL HIGH (ref 80.0–100.0)
Monocytes Absolute: 1 10*3/uL (ref 0.1–1.0)
Monocytes Relative: 6 %
Neutro Abs: 6.2 10*3/uL (ref 1.7–7.7)
Neutrophils Relative %: 40 %
Platelets: 144 10*3/uL — ABNORMAL LOW (ref 150–400)
RBC: 3.34 MIL/uL — ABNORMAL LOW (ref 4.22–5.81)
RDW: 16.2 % — ABNORMAL HIGH (ref 11.5–15.5)
Smear Review: NORMAL
WBC: 15.5 10*3/uL — ABNORMAL HIGH (ref 4.0–10.5)
nRBC: 0 % (ref 0.0–0.2)

## 2019-11-18 LAB — COMPREHENSIVE METABOLIC PANEL
ALT: 59 U/L — ABNORMAL HIGH (ref 0–44)
AST: 43 U/L — ABNORMAL HIGH (ref 15–41)
Albumin: 3.5 g/dL (ref 3.5–5.0)
Alkaline Phosphatase: 97 U/L (ref 38–126)
Anion gap: 14 (ref 5–15)
BUN: 53 mg/dL — ABNORMAL HIGH (ref 8–23)
CO2: 22 mmol/L (ref 22–32)
Calcium: 8.3 mg/dL — ABNORMAL LOW (ref 8.9–10.3)
Chloride: 99 mmol/L (ref 98–111)
Creatinine, Ser: 7.13 mg/dL — ABNORMAL HIGH (ref 0.61–1.24)
GFR calc Af Amer: 8 mL/min — ABNORMAL LOW (ref >60)
GFR calc non Af Amer: 7 mL/min — ABNORMAL LOW (ref >60)
Glucose, Bld: 254 mg/dL — ABNORMAL HIGH (ref 70–99)
Potassium: 4.5 mmol/L (ref 3.5–5.1)
Sodium: 135 mmol/L (ref 135–145)
Total Bilirubin: 0.7 mg/dL (ref 0.3–1.2)
Total Protein: 7.5 g/dL (ref 6.5–8.1)

## 2019-11-18 LAB — BLOOD GAS, ARTERIAL
Acid-base deficit: 8.1 mmol/L — ABNORMAL HIGH (ref 0.0–2.0)
Bicarbonate: 19.9 mmol/L — ABNORMAL LOW (ref 20.0–28.0)
Delivery systems: POSITIVE
Expiratory PAP: 7
FIO2: 0.7
Inspiratory PAP: 14
O2 Saturation: 98 %
Patient temperature: 37
pCO2 arterial: 51 mmHg — ABNORMAL HIGH (ref 32.0–48.0)
pH, Arterial: 7.2 — ABNORMAL LOW (ref 7.350–7.450)
pO2, Arterial: 124 mmHg — ABNORMAL HIGH (ref 83.0–108.0)

## 2019-11-18 LAB — BRAIN NATRIURETIC PEPTIDE: B Natriuretic Peptide: >4500 pg/mL — ABNORMAL HIGH (ref 0.0–100.0)

## 2019-11-18 LAB — LACTIC ACID, PLASMA
Lactic Acid, Venous: 5.3 mmol/L (ref 0.5–1.9)
Lactic Acid, Venous: 2.7 mmol/L (ref 0.5–1.9)

## 2019-11-18 LAB — TROPONIN I (HIGH SENSITIVITY)
Troponin I (High Sensitivity): 79 ng/L — ABNORMAL HIGH (ref <18)
Troponin I (High Sensitivity): 192 ng/L (ref <18)

## 2019-11-18 MED ORDER — CHLORHEXIDINE GLUCONATE CLOTH 2 % EX PADS
6.0000 | MEDICATED_PAD | Freq: Every day | CUTANEOUS | Status: DC
  Administered 2019-11-19 – 2019-11-23 (×4): 6 via TOPICAL
  Filled 2019-11-18: qty 6

## 2019-11-18 MED ORDER — CHLORHEXIDINE GLUCONATE 0.12 % MT SOLN
15.0000 mL | Freq: Two times a day (BID) | OROMUCOSAL | Status: DC
  Administered 2019-11-19 – 2019-11-23 (×6): 15 mL via OROMUCOSAL
  Filled 2019-11-18 (×6): qty 15

## 2019-11-18 MED ORDER — NITROGLYCERIN IN D5W 200-5 MCG/ML-% IV SOLN: 0.0000 ug/min | INTRAVENOUS | Status: DC

## 2019-11-18 MED ORDER — NITROGLYCERIN IN D5W 200-5 MCG/ML-% IV SOLN
INTRAVENOUS | Status: AC
  Administered 2019-11-18: 20 ug/min via INTRAVENOUS
  Filled 2019-11-18: qty 250

## 2019-11-18 MED ORDER — ORAL CARE MOUTH RINSE
15.0000 mL | Freq: Two times a day (BID) | OROMUCOSAL | Status: DC
  Administered 2019-11-19 – 2019-11-23 (×3): 15 mL via OROMUCOSAL

## 2019-11-18 MED ORDER — LORAZEPAM 2 MG/ML IJ SOLN
0.5000 mg | Freq: Once | INTRAMUSCULAR | Status: AC
  Administered 2019-11-18: 0.5 mg via INTRAVENOUS
  Filled 2019-11-18: qty 1

## 2019-11-18 NOTE — ED Provider Notes (Signed)
ER Provider Note       Time seen: 9:19 PM    I have reviewed the vital signs and the nursing notes.  HISTORY   Chief Complaint Respiratory Distress  Level V caveat: History/ROS limited by respiratory distress  HPI Scott Oconnor is a 74 y.o. male with a history of hyperlipidemia, hypertension, peripheral vascular disease, end-stage renal disease on dialysis who presents today for severe respiratory distress.  Patient arrives by EMS from home, has had increased shortness of breath over the past several days but today was significantly worse.  Recently started dialysis but missed today's session because he was feeling bad.  Sats were in the 70s, placed on CPAP, on arrival converted to BiPAP.  Markedly hypertensive in route.  Past Medical History:  Diagnosis Date   Hyperlipidemia    Hypertension    PVD (peripheral vascular disease) ([place])    Reynolds syndrome [facility])     Past Surgical History:  Procedure Laterality Date   DIALYSIS/PERMA CATHETER INSERTION N/A 09/26/2019   Procedure: DIALYSIS/PERMA CATHETER INSERTION;  Surgeon: Algernon Huxley, MD;  Location: [facility];  Service: Cardiovascular;  Laterality: N/A;   ENDOVASCULAR REPAIR/STENT GRAFT N/A 08/14/2019   Procedure: ENDOVASCULAR REPAIR/STENT GRAFT;  Surgeon: Katha Cabal, MD;  Location: [facility];  Service: Cardiovascular;  Laterality: N/A;    Allergies Patient has no known allergies.  Review of Systems Review of systems otherwise unknown Respiratory: Positive for shortness of breath  All systems negative/normal/unremarkable except as stated in the HPI  ____________________________________________   PHYSICAL EXAM:  VITAL SIGNS: There were no vitals filed for this visit.  Constitutional: Alert and oriented. Well appearing and in no distress. Eyes: Conjunctivae are normal. Normal extraocular movements. ENT      Head: Normocephalic and atraumatic.      Nose: No  congestion/rhinnorhea.      Mouth/Throat: Mucous membranes are moist.      Neck: No stridor. Cardiovascular: Normal rate, regular rhythm. No murmurs, rubs, or gallops. Respiratory: Normal respiratory effort without tachypnea nor retractions. Breath sounds are clear and equal bilaterally. No wheezes/rales/rhonchi. Gastrointestinal: Soft and nontender. Normal bowel sounds Musculoskeletal: Nontender with normal range of motion in extremities. No lower extremity tenderness nor edema. Neurologic:  Normal speech and language. No gross focal neurologic deficits are appreciated.  Skin:  Skin is warm, dry and intact. No rash noted. Psychiatric: Speech and behavior are normal.  ____________________________________________  EKG: Interpreted by me.  Sinus tachycardia with a rate of 126 bpm, anterior infarct age-indeterminate, normal axis, normal QT  ____________________________________________   LABS (pertinent positives/negatives)  Labs Reviewed  COMPREHENSIVE METABOLIC PANEL - Abnormal; Notable for the following components:      Result Value   Glucose, Bld 254 (*)    BUN 53 (*)    Creatinine, Ser 7.13 (*)    Calcium 8.3 (*)    AST 43 (*)    ALT 59 (*)    GFR calc non Af Amer 7 (*)    GFR calc Af Amer 8 (*)    All other components within normal limits  BLOOD GAS, ARTERIAL - Abnormal; Notable for the following components:   pH, Arterial 7.20 (*)    pCO2 arterial 51 (*)    pO2, Arterial 124 (*)    Bicarbonate 19.9 (*)    Acid-base deficit 8.1 (*)    All other components within normal limits  TROPONIN I (HIGH SENSITIVITY) - Abnormal; Notable for the following components:   Troponin I (  High Sensitivity) 79 (*)    All other components within normal limits  CBC WITH DIFFERENTIAL/PLATELET  BRAIN NATRIURETIC PEPTIDE  LACTIC ACID, PLASMA  LACTIC ACID, PLASMA   CRITICAL CARE Performed by: Laurence Aly   Total critical care time: 30 minutes  Critical care time was exclusive of  separately billable procedures and treating other patients.  Critical care was necessary to treat or prevent imminent or life-threatening deterioration.  Critical care was time spent personally by me on the following activities: development of treatment plan with patient and/or surrogate as well as nursing, discussions with consultants, evaluation of patient's response to treatment, examination of patient, obtaining history from patient or surrogate, ordering and performing treatments and interventions, ordering and review of laboratory studies, ordering and review of radiographic studies, pulse oximetry and re-evaluation of patient's condition.  RADIOLOGY  Images were viewed by me Chest x-ray IMPRESSION: 1. Moderate bilateral pleural effusions with bibasilar consolidations. 2. Vascular congestion with moderate to marked diffuse bilateral interstitial and ground-glass opacity, most suspicious for pulmonary edema.  DIFFERENTIAL DIAGNOSIS  Flash pulmonary edema, end-stage renal disease on dialysis, electrolyte abnormality, hypertensive emergency, MI  ASSESSMENT AND PLAN  Acute respiratory failure with hypoxia, flash pulmonary edema, end-stage renal disease on dialysis   Plan: The patient had presented for respiratory distress and was immediately placed on BiPAP. Patient's labs indicated renal failure as expected.  Is unclear if this is flash pulmonary edema or merely worsening end-stage renal disease having missed dialysis today with worsening pulmonary edema.  Initially he was markedly hypertensive to over 035 systolic, I placed him on a nitroglycerin drip and he states he does not feel like he needs to be intubated at this time.  Appears to be mildly improved.  I have discussed with nephrology who will arrange dialysis for him tonight.  I will discuss with the intensivist for admission.  Lenise Arena MD    Note: This note was generated in part or whole with voice recognition  software. Voice recognition is usually quite accurate but there are transcription errors that can and very often do occur. I apologize for any typographical errors that were not detected and corrected.     Earleen Newport, MD 11/18/19 2205

## 2019-11-18 NOTE — ED Notes (Signed)
md at bedside again

## 2019-11-18 NOTE — ED Notes (Signed)
Report called to [facility] rn icu nurse

## 2019-11-18 NOTE — ED Notes (Signed)
Family in with pt condom cath in place

## 2019-11-18 NOTE — Progress Notes (Signed)
[facility]  ROUNDING NOTE   Subjective:   [name] admitted to [facility] on 11/18/2019 for Acute pulmonary edema ([place]) [J81.0]  Patient's last hemodialysis treatment was 6/26 Saturday where he left at 53.5kg, 1 kg above his estimated dry weight.   Objective:  Vital signs in last 24 hours:  Pulse Rate:  [27-126] 117 (06/28 2146) Resp:  [20-34] 31 (06/28 2146) BP: (132-252)/(52-110) 252/110 (06/28 2210) SpO2:  [93 %-100 %] 100 % (06/28 2146) Weight:  [59 kg] 59 kg (06/28 2127)  Weight change:  Filed Weights   11/18/19 2127  Weight: 59 kg    Intake/Output: No intake/output data recorded.   Intake/Output this shift:  No intake/output data recorded.  Physical Exam: General: Critically ill  Head: Normocephalic, atraumatic. Moist oral mucosal membranes  Eyes: Anicteric, PERRL  Neck: Supple, trachea midline  Lungs:  Bilateral crackles, +BIPAP  Heart: Regular rate and rhythm  Abdomen:  Soft, nontender,   Extremities:  1+ peripheral edema.  Neurologic: Nonfocal, moving all four extremities  Skin: No lesions  Access: RIJ permcath    Basic Metabolic Panel: Recent Labs  Lab 11/18/19 2118  NA 135  K 4.5  CL 99  CO2 22  GLUCOSE 254*  BUN 53*  CREATININE 7.13*  CALCIUM 8.3*    Liver Function Tests: Recent Labs  Lab 11/18/19 2118  AST 43*  ALT 59*  ALKPHOS 97  BILITOT 0.7  PROT 7.5  ALBUMIN 3.5   No results for input(s): LIPASE, AMYLASE in the last 168 hours. No results for input(s): AMMONIA in the last 168 hours.  CBC: Recent Labs  Lab 11/18/19 2118  WBC 15.5*  NEUTROABS 6.2  HGB 11.0*  HCT 34.2*  MCV 102.4*  PLT 144*    Cardiac Enzymes: No results for input(s): CKTOTAL, CKMB, CKMBINDEX, TROPONINI in the last 168 hours.  BNP: Invalid input(s): POCBNP  CBG: No results for input(s): GLUCAP in the last 168 hours.  Microbiology: Results for orders placed or performed during the hospital encounter of 09/20/19  Respiratory  Panel by RT PCR (Flu A&B, Covid) - Nasopharyngeal Swab     Status: None   Collection Time: 09/20/19 11:13 AM   Specimen: Nasopharyngeal Swab  Result Value Ref Range Status   SARS Coronavirus 2 by RT PCR NEGATIVE NEGATIVE Final    Comment: (NOTE) SARS-CoV-2 target nucleic acids are NOT DETECTED. The SARS-CoV-2 RNA is generally detectable in upper respiratoy specimens during the acute phase of infection. The lowest concentration of SARS-CoV-2 viral copies this assay can detect is 131 copies/mL. A negative result does not preclude SARS-Cov-2 infection and should not be used as the sole basis for treatment or other patient management decisions. A negative result may occur with  improper specimen collection/handling, submission of specimen other than nasopharyngeal swab, presence of viral mutation(s) within the areas targeted by this assay, and inadequate number of viral copies (<131 copies/mL). A negative result must be combined with clinical observations, patient history, and epidemiological information. The expected result is Negative. Fact Sheet for Patients:  PinkCheek.be Fact Sheet for Healthcare Providers:  GravelBags.it This test is not yet ap proved or cleared by the [place] FDA and  has been authorized for detection and/or diagnosis of SARS-CoV-2 by FDA under an Emergency Use Authorization (EUA). This EUA will remain  in effect (meaning this test can be used) for the duration of the COVID-19 declaration under Section 564(b)(1) of the Act, 21 U.S.C. section 360bbb-3(b)(1), unless the authorization is terminated or  revoked sooner.    Influenza A by PCR NEGATIVE NEGATIVE Final   Influenza B by PCR NEGATIVE NEGATIVE Final    Comment: (NOTE) The Xpert Xpress SARS-CoV-2/FLU/RSV assay is intended as an aid in  the diagnosis of influenza from Nasopharyngeal swab specimens and  should not be used as a sole basis for  treatment. Nasal washings and  aspirates are unacceptable for Xpert Xpress SARS-CoV-2/FLU/RSV  testing. Fact Sheet for Patients: PinkCheek.be Fact Sheet for Healthcare Providers: GravelBags.it This test is not yet approved or cleared by the [place] FDA and  has been authorized for detection and/or diagnosis of SARS-CoV-2 by  FDA under an Emergency Use Authorization (EUA). This EUA will remain  in effect (meaning this test can be used) for the duration of the  Covid-19 declaration under Section 564(b)(1) of the Act, 21  U.S.C. section 360bbb-3(b)(1), unless the authorization is  terminated or revoked. Performed at [facility], [address] [place], [place] [address]   Expectorated sputum assessment w rflx to resp cult     Status: None   Collection Time: 09/29/19 11:07 AM   Specimen: Sputum  Result Value Ref Range Status   Specimen Description SPUTUM  Final   Special Requests NONE  Final   Sputum evaluation   Final    THIS SPECIMEN IS ACCEPTABLE FOR SPUTUM CULTURE Performed at [facility], [address], [place], [place] [address]    Report Status 09/29/2019 FINAL  Final  Culture, respiratory     Status: None   Collection Time: 09/29/19 11:07 AM   Specimen: SPU  Result Value Ref Range Status   Specimen Description   Final    SPUTUM Performed at [facility], [address], [place], [place] [address]    Special Requests   Final    NONE Reflexed from [id number] Performed at [facility], [address] [place], [place] [address]    Gram Stain   Final    FEW WBC PRESENT, PREDOMINANTLY PMN FEW GRAM POSITIVE COCCI FEW GRAM NEGATIVE RODS FEW GRAM POSITIVE RODS    Culture   Final    RARE ESCHERICHIA COLI WITH IN NORMAL RESPIRATORY FLORA Performed at [facility], [place] [address], [place], [place] [address]    Report Status 10/02/2019 FINAL  Final   Organism ID,  Bacteria ESCHERICHIA COLI  Final      Susceptibility   Escherichia coli - MIC*    AMPICILLIN <=2 SENSITIVE Sensitive     CEFAZOLIN <=4 SENSITIVE Sensitive     CEFEPIME <=1 SENSITIVE Sensitive     CEFTAZIDIME <=1 SENSITIVE Sensitive     CEFTRIAXONE <=1 SENSITIVE Sensitive     CIPROFLOXACIN <=0.25 SENSITIVE Sensitive     GENTAMICIN <=1 SENSITIVE Sensitive     IMIPENEM <=0.25 SENSITIVE Sensitive     TRIMETH/SULFA <=20 SENSITIVE Sensitive     AMPICILLIN/SULBACTAM <=2 SENSITIVE Sensitive     PIP/TAZO <=4 SENSITIVE Sensitive     * RARE ESCHERICHIA COLI    Coagulation Studies: No results for input(s): LABPROT, INR in the last 72 hours.  Urinalysis: No results for input(s): COLORURINE, LABSPEC, PHURINE, GLUCOSEU, HGBUR, BILIRUBINUR, KETONESUR, PROTEINUR, UROBILINOGEN, NITRITE, LEUKOCYTESUR in the last 72 hours.  Invalid input(s): APPERANCEUR    Imaging: DG Chest Port 1 View  Result Date: 11/18/2019 CLINICAL DATA:  Dyspnea EXAM: PORTABLE CHEST 1 VIEW COMPARISON:  09/29/2019 FINDINGS: Right-sided central venous catheter with tips over the SVC and cavoatrial region. Moderate bilateral pleural effusions. Dense basilar  consolidations. Obscured cardiomediastinal silhouette. Vascular congestion with moderate to marked diffuse bilateral interstitial and ground-glass opacity, likely edema. Aortic atherosclerosis. No pneumothorax. IMPRESSION: 1. Moderate bilateral pleural effusions with bibasilar consolidations. 2. Vascular congestion with moderate to marked diffuse bilateral interstitial and ground-glass opacity, most suspicious for pulmonary edema. Electronically Signed   By: Donavan Foil M.D.   On: 11/18/2019 21:45     Medications:   . nitroGLYCERIN 50 mcg/min (11/18/19 2125)      Assessment/ Plan:  [name] is a 74 y.o. [place] male with acute renal failure from cholesterol emboli requiring hemodialysis, hypertension, peripheral vascular disease, hyperlipidemia, who is  admitted to [facility] on 11/18/2019 for Acute pulmonary edema ([place]) [J81.0]   [facility] M[facility] RIJ permcath 52.5kg  1. Acute renal failure requiring hemodialysis: last hemodialysis treatment was 6/26.  - emergent dialysis orders placed  2. Hypertension: hypertensive emergency on admission. Home regimen of amlodipine, hydralazine, labetalol, and torsemide.  NTG gtt.   3. 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 Macrocytic. EPO as outpatient.   4. Secondary Hyperparathyroidism: with hyperphosphatemia. Not currently on binders.    LOS: 0 Scott Oconnor 6/28/202110:18 PM

## 2019-11-18 NOTE — ED Triage Notes (Signed)
Pt via EMS from home. Pt states he has been increasingly SOB last couple of days but today has been significantly worse. Pt was recently started on dialysis last week and missed today's session because he was feeling bad. Per EMS, pt RA sat in the 70s. EMS placed the pt on CPap. On arrival, pt placed on BiPap. BP 235/128. EMS put 2in of Nitro paste.

## 2019-11-18 NOTE — ED Notes (Signed)
Pt on bipap.  Iv ntg infusing.  ntg paste removed from left chest.  Sinus tach on monitor.

## 2019-11-19 ENCOUNTER — Other Ambulatory Visit: Payer: Self-pay

## 2019-11-19 ENCOUNTER — Inpatient Hospital Stay (HOSPITAL_COMMUNITY)
Admit: 2019-11-19 | Discharge: 2019-11-19 | Disposition: A | Discharge status: Home / Self Care | Payer: Medicare Other | Attending: Pulmonary Disease | Admitting: Pulmonary Disease

## 2019-11-19 ENCOUNTER — Inpatient Hospital Stay: Payer: Medicare Other

## 2019-11-19 DIAGNOSIS — Z992 Dependence on renal dialysis: Secondary | ICD-10-CM

## 2019-11-19 DIAGNOSIS — I5021 Acute systolic (congestive) heart failure: Secondary | ICD-10-CM

## 2019-11-19 DIAGNOSIS — I161 Hypertensive emergency: Secondary | ICD-10-CM

## 2019-11-19 DIAGNOSIS — I5031 Acute diastolic (congestive) heart failure: Secondary | ICD-10-CM

## 2019-11-19 DIAGNOSIS — J9601 Acute respiratory failure with hypoxia: Secondary | ICD-10-CM

## 2019-11-19 DIAGNOSIS — N186 End stage renal disease: Secondary | ICD-10-CM

## 2019-11-19 DIAGNOSIS — J810 Acute pulmonary edema: Secondary | ICD-10-CM

## 2019-11-19 DIAGNOSIS — I10 Essential (primary) hypertension: Secondary | ICD-10-CM

## 2019-11-19 LAB — BASIC METABOLIC PANEL
Anion gap: 14 (ref 5–15)
BUN: 37 mg/dL — ABNORMAL HIGH (ref 8–23)
CO2: 26 mmol/L (ref 22–32)
Calcium: 9.1 mg/dL (ref 8.9–10.3)
Chloride: 100 mmol/L (ref 98–111)
Creatinine, Ser: 4.97 mg/dL — ABNORMAL HIGH (ref 0.61–1.24)
GFR calc Af Amer: 12 mL/min — ABNORMAL LOW (ref >60)
GFR calc non Af Amer: 11 mL/min — ABNORMAL LOW (ref >60)
Glucose, Bld: 155 mg/dL — ABNORMAL HIGH (ref 70–99)
Potassium: 4.3 mmol/L (ref 3.5–5.1)
Sodium: 140 mmol/L (ref 135–145)

## 2019-11-19 LAB — MAGNESIUM: Magnesium: 1.9 mg/dL (ref 1.7–2.4)

## 2019-11-19 LAB — HEPATITIS B SURFACE ANTIGEN: Hepatitis B Surface Ag: NONREACTIVE

## 2019-11-19 LAB — ECHOCARDIOGRAM COMPLETE
Height: 66 in
Weight: 2077.62 oz

## 2019-11-19 LAB — SARS CORONAVIRUS 2 BY RT PCR (HOSPITAL ORDER, PERFORMED IN ~~LOC~~ HOSPITAL LAB): SARS Coronavirus 2: NEGATIVE

## 2019-11-19 LAB — TROPONIN I (HIGH SENSITIVITY): Troponin I (High Sensitivity): 390 ng/L (ref <18)

## 2019-11-19 LAB — MRSA PCR SCREENING: MRSA by PCR: NEGATIVE

## 2019-11-19 LAB — GLUCOSE, CAPILLARY: Glucose-Capillary: 153 mg/dL — ABNORMAL HIGH (ref 70–99)

## 2019-11-19 LAB — PHOSPHORUS: Phosphorus: 4.2 mg/dL (ref 2.5–4.6)

## 2019-11-19 MED ORDER — FAMOTIDINE 20 MG PO TABS
20.0000 mg | ORAL_TABLET | Freq: Every day | ORAL | Status: DC
  Administered 2019-11-19 – 2019-11-23 (×4): 20 mg via ORAL
  Filled 2019-11-19 (×4): qty 1

## 2019-11-19 MED ORDER — TORSEMIDE 20 MG PO TABS
20.0000 mg | ORAL_TABLET | Freq: Every day | ORAL | Status: DC
  Administered 2019-11-19: 20 mg via ORAL
  Filled 2019-11-19: qty 1

## 2019-11-19 MED ORDER — POLYETHYLENE GLYCOL 3350 17 G PO PACK: 17.0000 g | PACK | Freq: Every day | ORAL | Status: DC | PRN

## 2019-11-19 MED ORDER — ACETAMINOPHEN 500 MG PO TABS
1000.0000 mg | ORAL_TABLET | Freq: Four times a day (QID) | ORAL | Status: DC | PRN
  Administered 2019-11-19 – 2019-11-23 (×5): 1000 mg via ORAL
  Filled 2019-11-19 (×4): qty 2

## 2019-11-19 MED ORDER — NICARDIPINE HCL IN NACL 20-0.86 MG/200ML-% IV SOLN
0.0000 mg/h | INTRAVENOUS | Status: DC
  Filled 2019-11-19: qty 200

## 2019-11-19 MED ORDER — FAMOTIDINE IN NACL 20-0.9 MG/50ML-% IV SOLN: 20.0000 mg | Freq: Every day | INTRAVENOUS | Status: DC

## 2019-11-19 MED ORDER — MIRTAZAPINE 15 MG PO TABS
15.0000 mg | ORAL_TABLET | Freq: Every day | ORAL | Status: DC
  Administered 2019-11-19: 15 mg via ORAL
  Filled 2019-11-19 (×2): qty 1

## 2019-11-19 MED ORDER — ACETAMINOPHEN 500 MG PO TABS
ORAL_TABLET | ORAL | Status: AC
  Filled 2019-11-19: qty 2

## 2019-11-19 MED ORDER — CARVEDILOL 12.5 MG PO TABS
12.5000 mg | ORAL_TABLET | Freq: Two times a day (BID) | ORAL | Status: DC
  Administered 2019-11-19 – 2019-11-20 (×2): 12.5 mg via ORAL
  Filled 2019-11-19 (×4): qty 1

## 2019-11-19 MED ORDER — HEPARIN SODIUM (PORCINE) 5000 UNIT/ML IJ SOLN
5000.0000 [IU] | Freq: Three times a day (TID) | INTRAMUSCULAR | Status: DC
  Administered 2019-11-19 – 2019-11-21 (×6): 5000 [IU] via SUBCUTANEOUS
  Filled 2019-11-19 (×6): qty 1

## 2019-11-19 MED ORDER — FUROSEMIDE 10 MG/ML IJ SOLN
60.0000 mg | Freq: Two times a day (BID) | INTRAMUSCULAR | Status: DC
  Administered 2019-11-19 – 2019-11-22 (×4): 60 mg via INTRAVENOUS
  Filled 2019-11-19 (×6): qty 6

## 2019-11-19 MED ORDER — NEPRO/CARBSTEADY PO LIQD: 237.0000 mL | Freq: Two times a day (BID) | ORAL | Status: DC

## 2019-11-19 MED ORDER — DOCUSATE SODIUM 100 MG PO CAPS: 100.0000 mg | ORAL_CAPSULE | Freq: Two times a day (BID) | ORAL | Status: DC | PRN

## 2019-11-19 MED ORDER — ASPIRIN EC 81 MG PO TBEC
81.0000 mg | DELAYED_RELEASE_TABLET | Freq: Every day | ORAL | Status: DC
  Administered 2019-11-20 – 2019-11-23 (×3): 81 mg via ORAL
  Filled 2019-11-19 (×3): qty 1

## 2019-11-19 MED ORDER — LABETALOL HCL 100 MG PO TABS
100.0000 mg | ORAL_TABLET | Freq: Two times a day (BID) | ORAL | Status: DC
  Administered 2019-11-19: 100 mg via ORAL
  Filled 2019-11-19 (×3): qty 1

## 2019-11-19 MED ORDER — LABETALOL HCL 5 MG/ML IV SOLN
20.0000 mg | INTRAVENOUS | Status: DC | PRN
  Administered 2019-11-19 – 2019-11-22 (×9): 20 mg via INTRAVENOUS
  Filled 2019-11-19 (×11): qty 4

## 2019-11-19 NOTE — Progress Notes (Signed)
[facility]  ROUNDING NOTE   Subjective:   Emergent hemodialysis treatment last night. UF of 3.5 liters.  Weaned off noninvasive ventilation.   Daughter at bedside.   Objective:  Vital signs in last 24 hours:  Temp:  [97.5 F (36.4 C)-98.2 F (36.8 C)] 97.5 F (36.4 C) (06/29 0800) Pulse Rate:  [27-130] 72 (06/29 1400) Resp:  [12-34] 14 (06/29 1400) BP: (95-252)/(52-130) 155/67 (06/29 1300) SpO2:  [84 %-100 %] 100 % (06/29 1400) FiO2 (%):  [50 %] 50 % (06/29 0700) Weight:  [58.9 kg-59 kg] 58.9 kg (06/29 0055)  Weight change:  Filed Weights   11/18/19 2127 11/19/19 0055  Weight: 59 kg 58.9 kg    Intake/Output: I/O last 3 completed shifts: In: -  Out: 3500 [Other:3500]   Intake/Output this shift:  No intake/output data recorded.  Physical Exam: General: Critically ill  Head: Normocephalic, atraumatic. Moist oral mucosal membranes  Eyes: Anicteric, PERRL  Neck: Supple, trachea midline  Lungs:  Bilateral crackles  Heart: Regular rate and rhythm  Abdomen:  Soft, nontender,   Extremities:  1+ peripheral edema.  Neurologic: Nonfocal, moving all four extremities  Skin: No lesions  Access: RIJ permcath    Basic Metabolic Panel: Recent Labs  Lab 11/18/19 2118 11/19/19 0129 11/19/19 0407  NA 135  --  140  K 4.5  --  4.3  CL 99  --  100  CO2 22  --  26  GLUCOSE 254*  --  155*  BUN 53*  --  37*  CREATININE 7.13*  --  4.97*  CALCIUM 8.3*  --  9.1  MG  --  1.9  --   PHOS  --  4.2  --     Liver Function Tests: Recent Labs  Lab 11/18/19 2118  AST 43*  ALT 59*  ALKPHOS 97  BILITOT 0.7  PROT 7.5  ALBUMIN 3.5   No results for input(s): LIPASE, AMYLASE in the last 168 hours. No results for input(s): AMMONIA in the last 168 hours.  CBC: Recent Labs  Lab 11/18/19 2118  WBC 15.5*  NEUTROABS 6.2  HGB 11.0*  HCT 34.2*  MCV 102.4*  PLT 144*    Cardiac Enzymes: No results for input(s): CKTOTAL, CKMB, CKMBINDEX, TROPONINI in the last  168 hours.  BNP: Invalid input(s): POCBNP  CBG: Recent Labs  Lab 11/18/19 2353  GLUCAP 153*    Microbiology: Results for orders placed or performed during the hospital encounter of 11/18/19  SARS Coronavirus 2 by RT PCR (hospital order, performed in [facility] hospital lab) Nasopharyngeal Nasopharyngeal Swab     Status: None   Collection Time: 11/18/19 10:59 PM   Specimen: Nasopharyngeal Swab  Result Value Ref Range Status   SARS Coronavirus 2 NEGATIVE NEGATIVE Final    Comment: (NOTE) SARS-CoV-2 target nucleic acids are NOT DETECTED.  The SARS-CoV-2 RNA is generally detectable in upper and lower respiratory specimens during the acute phase of infection. The lowest concentration of SARS-CoV-2 viral copies this assay can detect is 250 copies / mL. A negative result does not preclude SARS-CoV-2 infection and should not be used as the sole basis for treatment or other patient management decisions.  A negative result may occur with improper specimen collection / handling, submission of specimen other than nasopharyngeal swab, presence of viral mutation(s) within the areas targeted by this assay, and inadequate number of viral copies (<250 copies / mL). A negative result must be combined with clinical observations, patient history, and epidemiological information.  Fact Sheet for Patients:   StrictlyIdeas.no  Fact Sheet for Healthcare Providers: BankingDealers.co.za  This test is not yet approved or  cleared by the [place] FDA and has been authorized for detection and/or diagnosis of SARS-CoV-2 by FDA under an Emergency Use Authorization (EUA).  This EUA will remain in effect (meaning this test can be used) for the duration of the COVID-19 declaration under Section 564(b)(1) of the Act, 21 U.S.C. section 360bbb-3(b)(1), unless the authorization is terminated or revoked sooner.  Performed at [facility], [address] [place], [place] [address]   MRSA PCR Screening     Status: None   Collection Time: 11/19/19 12:00 AM   Specimen: Nasopharyngeal  Result Value Ref Range Status   MRSA by PCR NEGATIVE NEGATIVE Final    Comment:        The GeneXpert MRSA Assay (FDA approved for NASAL specimens only), is one component of a comprehensive MRSA colonization surveillance program. It is not intended to diagnose MRSA infection nor to guide or monitor treatment for MRSA infections. Performed at [facility], [address] [place], [place] [address]     Coagulation Studies: No results for input(s): LABPROT, INR in the last 72 hours.  Urinalysis: No results for input(s): COLORURINE, LABSPEC, PHURINE, GLUCOSEU, HGBUR, BILIRUBINUR, KETONESUR, PROTEINUR, UROBILINOGEN, NITRITE, LEUKOCYTESUR in the last 72 hours.  Invalid input(s): APPERANCEUR    Imaging: CT HEAD WO CONTRAST  Result Date: 11/19/2019 CLINICAL DATA:  Ataxia, altered level of consciousness. EXAM: CT HEAD WITHOUT CONTRAST TECHNIQUE: Contiguous axial images were obtained from the base of the skull through the vertex without intravenous contrast. COMPARISON:  None. FINDINGS: Brain: Mild chronic ischemic white matter disease is noted. No mass effect or midline shift is noted. Ventricular size is within normal limits. There is no evidence of mass lesion, hemorrhage or acute infarction. Vascular: No hyperdense vessel or unexpected calcification. Skull: Normal. Negative for fracture or focal lesion. Sinuses/Orbits: No acute finding. Other: None. Electronically Signed   By: Marijo Conception M.D.   On: 11/19/2019 08:31   DG Chest Port 1 View  Result Date: 11/18/2019 CLINICAL DATA:  Dyspnea EXAM: PORTABLE CHEST 1 VIEW COMPARISON:  09/29/2019 FINDINGS: Right-sided central venous catheter with tips over the SVC and cavoatrial region. Moderate bilateral pleural effusions. Dense basilar consolidations. Obscured cardiomediastinal  silhouette. Vascular congestion with moderate to marked diffuse bilateral interstitial and ground-glass opacity, likely edema. Aortic atherosclerosis. No pneumothorax. IMPRESSION: 1. Moderate bilateral pleural effusions with bibasilar consolidations. 2. Vascular congestion with moderate to marked diffuse bilateral interstitial and ground-glass opacity, most suspicious for pulmonary edema. Electronically Signed   By: Donavan Foil M.D.   On: 11/18/2019 21:45   ECHOCARDIOGRAM COMPLETE  Result Date: 11/19/2019    ECHOCARDIOGRAM REPORT   Patient Name:   AABAN GRIEP Date of Exam: 11/19/2019 Medical Rec #:  [id number]         Height:       66.0 in Accession #:    [id number]        Weight:       129.9 lb Date of Birth:  Jul 28, 1945         BSA:          1.664 m Patient Age:    52 years          BP:           146/70 mmHg Patient Gender: M  HR:           59 bpm. Exam Location:  [facility] Procedure: 2D Echo, Color Doppler and Cardiac Doppler Indications:     CHF- acute diastolic 811.57  History:         Patient has prior history of Echocardiogram examinations, most                  recent 09/25/2019. Risk Factors:Hypertension and Dyslipidemia.                  PVD.  Sonographer:     [name] RDCS (AE) Referring Phys:  [id number] DONALD BRESCIA Diagnosing Phys: Nelva Bush MD IMPRESSIONS  1. Left ventricular ejection fraction, by estimation, is 30 to 35%. The left ventricle has moderately decreased function. The left ventricle demonstrates global hypokinesis. There is mild left ventricular hypertrophy. Left ventricular diastolic parameters are consistent with Grade I diastolic dysfunction (impaired relaxation). Elevated left atrial pressure.  2. Right ventricular systolic function is normal. The right ventricular size is normal. There is moderately elevated pulmonary artery systolic pressure.  3. Moderate pleural effusion in both left and right lateral regions.  4. The mitral valve is normal in structure.  Mild mitral valve regurgitation.  5. The aortic valve is tricuspid. Aortic valve regurgitation is trivial. No aortic stenosis is present.  6. The inferior vena cava is normal in size with <50% respiratory variability, suggesting right atrial pressure of 8 mmHg. Comparison(s): Prior images reviewed side by side. The left ventricular function is worsened. FINDINGS  Left Ventricle: Left ventricular ejection fraction, by estimation, is 30 to 35%. The left ventricle has moderately decreased function. The left ventricle demonstrates global hypokinesis. The left ventricular internal cavity size was normal in size. There is mild left ventricular hypertrophy. Left ventricular diastolic parameters are consistent with Grade I diastolic dysfunction (impaired relaxation). Elevated left atrial pressure. Right Ventricle: The right ventricular size is normal. No increase in right ventricular wall thickness. Right ventricular systolic function is normal. There is moderately elevated pulmonary artery systolic pressure. The tricuspid regurgitant velocity is 3.52 m/s, and with an assumed right atrial pressure of 8 mmHg, the estimated right ventricular systolic pressure is 97.4 mmHg. Left Atrium: Left atrial size was normal in size. Right Atrium: Right atrial size was normal in size. Pericardium: Trivial pericardial effusion is present. Mitral Valve: The mitral valve is normal in structure. Mild mitral annular calcification. Mild mitral valve regurgitation. Tricuspid Valve: The tricuspid valve is grossly normal. Tricuspid valve regurgitation is mild. Aortic Valve: The aortic valve is tricuspid. Aortic valve regurgitation is trivial. No aortic stenosis is present. Aortic valve mean gradient measures 2.0 mmHg. Aortic valve peak gradient measures 3.8 mmHg. Aortic valve area, by VTI measures 1.68 cm. Pulmonic Valve: The pulmonic valve was not well visualized. Pulmonic valve regurgitation is mild. No evidence of pulmonic stenosis. Aorta:  The aortic root is normal in size and structure. Pulmonary Artery: The pulmonary artery is not well seen. Venous: The inferior vena cava is normal in size with less than 50% respiratory variability, suggesting right atrial pressure of 8 mmHg. IAS/Shunts: No atrial level shunt detected by color flow Doppler. Additional Comments: There is a moderate pleural effusion in both left and right lateral regions.  LEFT VENTRICLE PLAX 2D LVIDd:         4.72 cm  Diastology LVIDs:         3.33 cm  LV e' lateral:   4.68 cm/s LV PW:  1.18 cm  LV E/e' lateral: 11.9 LV IVS:        0.92 cm  LV e' medial:    2.72 cm/s LVOT diam:     2.00 cm  LV E/e' medial:  20.5 LV SV:         34 LV SV Index:   20 LVOT Area:     3.14 cm  RIGHT VENTRICLE RV Basal diam:  2.66 cm RV S prime:     14.10 cm/s TAPSE (M-mode): 3.5 cm LEFT ATRIUM           Index       RIGHT ATRIUM           Index LA diam:      3.60 cm 2.16 cm/m  RA Area:     13.10 cm LA Vol (A2C): 48.2 ml 28.96 ml/m RA Volume:   34.40 ml  20.67 ml/m LA Vol (A4C): 26.9 ml 16.16 ml/m  AORTIC VALVE                   PULMONIC VALVE AV Area (Vmax):    1.67 cm    PV Vmax:       0.69 m/s AV Area (Vmean):   1.58 cm    PV Peak grad:  1.9 mmHg AV Area (VTI):     1.68 cm AV Vmax:           97.17 cm/s AV Vmean:          68.200 cm/s AV VTI:            0.202 m AV Peak Grad:      3.8 mmHg AV Mean Grad:      2.0 mmHg LVOT Vmax:         51.60 cm/s LVOT Vmean:        34.400 cm/s LVOT VTI:          0.108 m LVOT/AV VTI ratio: 0.53  AORTA Ao Root diam: 3.00 cm MITRAL VALVE               TRICUSPID VALVE MV Area (PHT): 3.65 cm    TR Peak grad:   49.6 mmHg MV Decel Time: 208 msec    TR Vmax:        352.00 cm/s MV E velocity: 55.70 cm/s MV A velocity: 95.10 cm/s  SHUNTS MV E/A ratio:  0.59        Systemic VTI:  0.11 m                            Systemic Diam: 2.00 cm Nelva Bush MD Electronically signed by Nelva Bush MD Signature Date/Time: 11/19/2019/2:00:40 PM    Final      Medications:    . niCARDipine Stopped (11/19/19 0100)  . nitroGLYCERIN Stopped (11/19/19 0200)   . chlorhexidine  15 mL Mouth Rinse BID  . Chlorhexidine Gluconate Cloth  6 each Topical Q0600  . famotidine  20 mg Oral Daily  . furosemide  60 mg Intravenous BID  . heparin  5,000 Units Subcutaneous Q8H  . labetalol  100 mg Oral BID  . mouth rinse  15 mL Mouth Rinse q12n4p     Assessment/ Plan:  Mr. Luisantonio Adinolfi is a 74 y.o. [place] male with acute renal failure from cholesterol emboli requiring hemodialysis, hypertension, peripheral vascular disease, hyperlipidemia, who is admitted to [facility] on 11/18/2019 for Acute pulmonary edema ([place]) [J81.0] End stage renal disease  on dialysis [facility]) [N18.6, Z99.2] Acute respiratory failure with hypoxia ([place]) [J96.01]   [facility] TTS [facility] RIJ permcath 52.5kg  1. Acute renal failure requiring hemodialysis: emergent hemodialysis treatment last night. UF of 3.5 liters Monitor daily for dialysis treatment  2. Hypertension: hypertensive emergency on admission. Home regimen of amlodipine, hydralazine, labetalol, and torsemide.  Weaned off nicardipine and NTG gtt - Change to IV furosemide.   3. 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 Macrocytic. EPO as outpatient.   4. Secondary Hyperparathyroidism: with hyperphosphatemia. Not currently on binders.    LOS: 1 Warda Mcqueary 6/29/20212:51 PM

## 2019-11-19 NOTE — Progress Notes (Signed)
Pharmacy Electrolyte Monitoring Consult:  Pharmacy consulted to assist in monitoring and replacing electrolytes in this 74 y.o. male admitted on 11/18/2019 with Respiratory Distress   Labs:  Sodium (mmol/L)  Date Value  11/18/2019 135   Potassium (mmol/L)  Date Value  11/18/2019 4.5   Magnesium (mg/dL)  Date Value  11/19/2019 1.9   Phosphorus (mg/dL)  Date Value  11/19/2019 4.2   Calcium (mg/dL)  Date Value  11/18/2019 8.3 (L)   Albumin (g/dL)  Date Value  11/18/2019 3.5    Assessment/Plan: K + 3.5 - 5.0 Mag > 2.0 Phos 2.5 - 4.5  06/29 @ 0130 all electrolytes WNL. Will continue to monitor.  Tobie Lords, PharmD, BCPS Clinical Pharmacist 11/19/2019 3:20 AM

## 2019-11-19 NOTE — Progress Notes (Signed)
*  PRELIMINARY RESULTS* Echocardiogram 2D Echocardiogram has been performed.  [name] 11/19/2019, 10:53 AM

## 2019-11-19 NOTE — Progress Notes (Signed)
CT done per order this am.

## 2019-11-19 NOTE — Progress Notes (Signed)
Dr. Juleen China in to see patient.

## 2019-11-19 NOTE — Progress Notes (Signed)
PT Cancellation Note  Patient Details Name: Scott Oconnor MRN: [id number] DOB: May 01, 1946   Cancelled Treatment:    Reason Eval/Treat Not Completed: Other (comment).  PT consult received.  Chart reviewed.  Pt sitting in recliner upon PT arrival; pt reports his daughter recently left to go home to get him some clothes.  Pt requesting therapist talk to his daughter for any questions.  Discussed with pt walking with physical therapy and pt reports he just got back from walking with his daughter and pt's nurse (using RW)--pt declining any ambulation d/t being tired from recent walk; nurse verified above and reports pt did well ambulating with RW.  Will re-attempt PT evaluation at a later date/time as able.  Leitha Bleak, PT 11/19/19, 2:29 PM

## 2019-11-19 NOTE — Progress Notes (Signed)
OT Cancellation Note  Patient Details Name: Scott Oconnor MRN: [id number] DOB: Dec 31, 1945   Cancelled Treatment:    Reason Eval/Treat Not Completed: Fatigue/lethargy limiting ability to participate. Consult received, chart reviewed. Upon attempt, pt sleeping, daughter present. Pt wakes easily, endorses fatigue and politely requests evaluation be deferred until tomorrow after stress test scheduled for 8am. Dtr requests therapy come after 10am to allow for a little rest in between. Will re-attempt OT evaluation next date.   Jeni Salles, MPH, MS, OTR/L ascom [phone] 11/19/19, 4:52 PM

## 2019-11-19 NOTE — H&P (Signed)
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 The patient was seemingly well until this evening when he noted shortness of breath that was abrupt in onset and rapidly progressive.  The patient was not "feeling well" for perhaps two days and as a result skipped dialysis today. The event was significant to phone 911. On arrival EMS found him saturating in the 24s and hypertensive to 200SBP. He was placed on CPAP with some improvement but his blood pressure remained very elevated in excess of 200. On arrival to the ED, he was indeed working to breath and so was switched to BiPAP from CPAP. His blood pressure was 230, and a nitroglycerin drip was started. With these interventions, he escaped what seemed and imminent intubation. A CXR revealed bilateral infiltrates and effusions. His dry weight was 53.5kg by the record, and he apparently was 58.9kg on admittance to the ICU.  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  has a past surgical history that includes ENDOVASCULAR REPAIR/STENT GRAFT (N/A, 08/14/2019) and DIALYSIS/PERMA CATHETER INSERTION (N/A, 09/26/2019). Prior to Admission medications   Medication Sig Start Date End Date Taking? Authorizing Provider  acetaminophen (TYLENOL) 500 MG tablet Take 500 mg by mouth every 8 (eight) hours as needed for moderate pain.    Yes [provider]  benzonatate (TESSALON) 100 MG capsule Take by mouth. 11/14/19 11/21/19 Yes [provider]  hydrALAZINE  (APRESOLINE) 25 MG tablet Take 1 tablet (25 mg total) by mouth every 8 (eight) hours as needed (for BP >170). Patient taking differently: Take 25-50 mg by mouth 3 (three) times daily as needed (for BP >170).  09/30/19  Yes Fritzi Mandes, MD  irbesartan (AVAPRO) 150 MG tablet SMARTSIG:1 Tablet(s) By Mouth Every Evening 10/22/19  Yes [provider]  labetalol (NORMODYNE) 100 MG tablet Take 1 tablet (100 mg total) by mouth 2 (two) times daily. 09/30/19  Yes Fritzi Mandes, MD  megestrol (MEGACE) 40 MG/ML suspension Take by mouth. 10/25/19 12/24/19 Yes [provider]  multivitamin (RENA-VIT) TABS tablet Take 1 tablet by mouth at bedtime. 09/30/19  Yes Fritzi Mandes, MD  torsemide (DEMADEX) 20 MG tablet Take 1 tablet (20 mg total) by mouth daily. 10/01/19  Yes Fritzi Mandes, MD  amLODipine (NORVASC) 10 MG tablet Take 1 tablet (10 mg total) by mouth daily. Patient not taking: Reported on 11/18/2019 10/01/19   Fritzi Mandes, MD  amLODipine (NORVASC) 5 MG tablet Take 5 mg by mouth daily. Patient not taking: Reported on 11/19/2019    [provider]  aspirin EC 81 MG tablet Take 81 mg by mouth at bedtime.     [provider]  gabapentin (NEURONTIN) 100 MG capsule Take 100 mg by mouth at bedtime. 11/09/19   [provider]  nicotine (NICODERM CQ - DOSED IN MG/24 HOURS) 21 mg/24hr patch Place 1 patch (21 mg total) onto the skin daily. 10/01/19   Fritzi Mandes, MD   Allergies  Allergen Reactions  . Ivp Dye [Iodinated Diagnostic Agents]     ESRD patient  FAMILY HISTORY:  family history includes Heart disease in his brother, brother, father, sister, and sister; Hyperlipidemia in his brother, brother, brother, brother, brother, and brother; Sudden Cardiac Death in his father. SOCIAL HISTORY:  reports that he has been smoking cigarettes. He has been smoking about 0.33 packs per day. He has never used smokeless tobacco. He reports that he does not drink alcohol and does not use  drugs.  REVIEW OF SYSTEMS:   Unable to obtain due to critical illness      Estimated body mass index is 20.96 kg/m as calculated from the following:   Height as of this encounter: 5\' 6"  (1.676 m).   Weight as of this encounter: 58.9 kg.    VITAL SIGNS: Temp:  [98 F (36.7 C)-98.2 F (36.8 C)] 98 F (36.7 C) (06/29 0300) Pulse Rate:  [27-126] 86 (06/29 0411) Resp:  [17-34] 19 (06/29 0411) BP: (95-252)/(52-130) 150/91 (06/29 0411) SpO2:  [84 %-100 %] 100 % (06/29 0411) Weight:  [58.9 kg-59 kg] 58.9 kg (06/29 0055)   No intake/output data recorded. Total I/O In: -  Out: 3500 [Other:3500]   SpO2: 100 %   Physical Examination:  GENERAL:critically ill appearing, +resp distress on NIV HEAD: Normocephalic, atraumatic.  EYES: Pupils equal, round, reactive to light.  No scleral icterus.  MOUTH: Moist mucosal membrane. NECK: Supple.  JVD.  PULMONARY: +rales at each base CARDIOVASCULAR: S1 and S2. Regular rate and rhythm. No murmurs, rubs, or gallops.  GASTROINTESTINAL: Soft, nontender, -distended.  Positive bowel sounds.  MUSCULOSKELETAL: No swelling, clubbing, or edema.  NEUROLOGIC: awake and following commands SKIN:intact,warm,dry  I personally reviewed lab work that was obtained in last 24 hrs. CXR Independently reviewed-with bilateral infiltrates   MEDICATIONS: I have reviewed all medications and confirmed regimen as documented   CULTURE RESULTS   Recent Results (from the past 240 hour(s))  SARS Coronavirus 2 by RT PCR (hospital order, performed in [facility] hospital lab) Nasopharyngeal Nasopharyngeal Swab     Status: None   Collection Time: 11/18/19 10:59 PM   Specimen: Nasopharyngeal Swab  Result Value Ref Range Status   SARS Coronavirus 2 NEGATIVE NEGATIVE Final    Comment: (NOTE) SARS-CoV-2 target nucleic acids are NOT DETECTED.  The SARS-CoV-2 RNA is generally detectable in upper and lower respiratory specimens during the acute phase of infection.  The lowest concentration of SARS-CoV-2 viral copies this assay can detect is 250 copies / mL. A negative result does not preclude SARS-CoV-2 infection and should not be used as the sole basis for treatment or other patient management decisions.  A negative result may occur with improper specimen collection / handling, submission of specimen other than nasopharyngeal swab, presence of viral mutation(s) within the areas targeted by this assay, and inadequate number of viral copies (<250 copies / mL). A negative result must be combined with clinical observations, patient history, and epidemiological information.  Fact Sheet for Patients:   StrictlyIdeas.no  Fact Sheet for Healthcare Providers: BankingDealers.co.za  This test is not yet approved or  cleared by the [place] FDA and has been authorized for detection and/or diagnosis of SARS-CoV-2 by FDA under an Emergency Use Authorization (EUA).  This EUA will remain in effect (meaning this test can be used) for the duration of the COVID-19 declaration under Section 564(b)(1) of the Act, 21 U.S.C. section 360bbb-3(b)(1), unless the authorization is terminated or revoked sooner.  Performed at [facility], [address] [place], [place] [address]   MRSA PCR Screening  Status: None   Collection Time: 11/19/19 12:00 AM   Specimen: Nasopharyngeal  Result Value Ref Range Status   MRSA by PCR NEGATIVE NEGATIVE Final    Comment:        The GeneXpert MRSA Assay (FDA approved for NASAL specimens only), is one component of a comprehensive MRSA colonization surveillance program. It is not intended to diagnose MRSA infection nor to guide or monitor treatment for MRSA infections. Performed at [facility], [address] [place], [place] [address]           IMAGING    DG Chest Port 1 View  Result Date: 11/18/2019 CLINICAL DATA:  Dyspnea EXAM: PORTABLE  CHEST 1 VIEW COMPARISON:  09/29/2019 FINDINGS: Right-sided central venous catheter with tips over the SVC and cavoatrial region. Moderate bilateral pleural effusions. Dense basilar consolidations. Obscured cardiomediastinal silhouette. Vascular congestion with moderate to marked diffuse bilateral interstitial and ground-glass opacity, likely edema. Aortic atherosclerosis. No pneumothorax. IMPRESSION: 1. Moderate bilateral pleural effusions with bibasilar consolidations. 2. Vascular congestion with moderate to marked diffuse bilateral interstitial and ground-glass opacity, most suspicious for pulmonary edema. Electronically Signed   By: Donavan Foil M.D.   On: 11/18/2019 21:45     Nutrition Status:           Indwelling Urinary Catheter HD  Reason to continue Indwelling Urinary Catheter    Central Line/ -  Reason to continue Ignacio   Severe ACUTE Hypoxic and Hypercapnic Respiratory Failure -HD to dry weight -Aggressive afterload reduction and rate control -continueNIV support -continue Bronchodilator Therapy -Wean Fio2 and EPAP as tolerated   ACUTE SYSTOLIC CARDIAC FAILURE- EF -oxygen/NIV as needed -afterload reduction and rate control -HD -follow up cardiac enzymes as indicated -repeat echo    ESRD/Renal Failure -nephrology following  -HD to dry weight    NEUROLOGY - avoid sedation   HEMODYNAMICS -labetalol ordered -attempt to wean from NTG -Nicardipine if required to absolutely control BP <140SBP  CARDIAC ICU monitoring  ID -trend mild leukocytosis  GI GI PROPHYLAXIS as indicated  NUTRITIONAL STATUS DIET-->TF's as tolerated Constipation protocol as indicated   ENDO - will use ICU hypoglycemic\Hyperglycemia protocol if needed    ELECTROLYTES -follow labs as needed -replace as needed -pharmacy consultation and following    DVT/GI PRX ordered and  assessed TRANSFUSIONS AS NEEDED MONITOR FSBS I Assessed the need for Labs I Assessed the need for Foley I Assessed the need for Central Venous Line Family Discussion when available I Assessed the need for Mobilization I made an Assessment of medications to be adjusted accordingly Safety Risk assessment Completed  CASE DISCUSSED with the patient's daughter   Critical Care Time devoted to patient care services described in this note is 40 minutes.   Overall, patient is critically ill, prognosis is guarded.  Patient with Multiorgan failure and at high risk for cardiac arrest and death.

## 2019-11-19 NOTE — Progress Notes (Addendum)
Good day. Up in chair x 3 hours. Walked in hall with walker a short distance. Echo had changed since May Echo. Cardiology consulted per Dr. Mortimer Fries. Dr End and PA Christell Faith in to see patient. Stress test scheduled for tomorrow am with Christell Faith PA. Hopefully home in am after Stress Test. Patient lives with daughter. Patient presently on room air.

## 2019-11-19 NOTE — Progress Notes (Signed)
Pharmacy Electrolyte Monitoring Consult:  Pharmacy consulted to assist in monitoring and replacing electrolytes in this 74 y.o. male admitted on 11/18/2019 with Respiratory Distress   Labs:  Sodium (mmol/L)  Date Value  11/19/2019 140   Potassium (mmol/L)  Date Value  11/19/2019 4.3   Magnesium (mg/dL)  Date Value  11/19/2019 1.9   Phosphorus (mg/dL)  Date Value  11/19/2019 4.2   Calcium (mg/dL)  Date Value  11/19/2019 9.1   Albumin (g/dL)  Date Value  11/18/2019 3.5    Assessment/Plan: 74 year old male admitted with respiratory distress following missed HD session. Also with significant hypertension on arrival. Patient recently started on HD, nephrology now planning to increase HD to three times weekly.  Goal replacement: K + 3.5 - 5.0 Mag > 2.0 Phos 2.5 - 4.5  No supplementation. Continue to monitor.  Scott Oconnor, PharmD Clinical Pharmacist 11/19/2019 2:56 PM

## 2019-11-19 NOTE — Consult Note (Signed)
Cardiology Consultation:   Patient ID: Scott Oconnor; [id number]; [birth year]   Admit date: 11/18/2019 Date of Consult: 11/19/2019  Primary Care Provider: [name], MD Primary Cardiologist: [name] Primary Electrophysiologist:  None   Patient Profile:   Scott Oconnor is a 74 y.o. male with a hx of AAA status post endovascular repair in 11/8586 complicated by ARF requiring HD, Raynaud's disease, HTN, and HLD who is being seen today for the evaluation of acute systolic CHF at the request of Dr. Mortimer Fries.  History of Present Illness:   Scott Oconnor has no previously known cardiac history.  He was without much significant past medical history up until earlier this year when he was evaluated by vascular surgery for claudication and lower extremity changes associated with Raynaud's.  Imaging revealed AAA requiring endograft repair on 08/14/2019.  Following this, he suffered from AKI requiring hemodialysis in early 09/2019.  Echo during this second admission for acute renal injury showed an EF of 60 to 65%, no regional wall motion abnormalities, mild LVH, grade 2 diastolic dysfunction, normal RV systolic function and RV cavity size, mildly elevated PASP estimated at 44.3 mmHg, mild to moderate mitral regurgitation, moderate tricuspid regurgitation.  With his acute illness this spring, there was an approximate 25 pound weight loss.  More recently, the patient was attempting to regain some of this weight and eating anything that sounded good without regard to contents of consumption.  He continued to make urine therefore his hemodialysis sessions were changed to twice weekly.  He last underwent hemodialysis as an outpatient on 6/26 with a post session weight of 53.5 kg which was 1 kg above his estimated dry weight.  On the evening of 6/28 he had abrupt onset of shortness of breath with associated generalized malaise and fatigue without chest pain, palpitations, dizziness, presyncope, or  syncope.  EMS was called and upon their arrival he was hypoxic with oxygen saturations in the 70s on 5 L supplemental oxygen via nasal cannula and hypertensive with systolic BP of 502.  He was placed on CPAP with some improvement though remained markedly hypertensive.  Upon the patient's arrival to [facility] they were found to have systolic BP of 774, HR 128N bpm, temp afebrile.  His CPAP was changed to BiPAP.  BNP greater than 4500 (previously 1195 1 month ago), initial high-sensitivity troponin 79 with a delta of 192 and peak of 390, currently downtrending to 348.  BUN/SCr 53/7.13, potassium 4.5, albumin 3.5, AST 43, ALT 59, magnesium 1.9, COVID-19 negative, PCO2 51, PO2 124 on BiPAP, lactic acid 5.3, WBC 15.5, Hgb 11.0, PLT 144.  EKG showed sinus tachycardia with nonspecific anterolateral ST-T changes as outlined below, CXR showed moderate bilateral pleural effusions with bibasilar consolidations, vascular congestion with moderate to marked diffuse bilateral interstitial and groundglass opacity most suspicious for pulmonary edema.  He was started on a nitroglycerin drip and underwent emergent hemodialysis with 3.5 L removed and with significant improvement in his symptoms and blood pressure.  Upon his arrival to the ICU documented weight of 58.9 kg with reported dry weight of approximately 52.5 kg.  Earlier today with possible altered mental status he underwent head CT which was nonacute with noted mild chronic ischemic white matter disease.  Echo on 11/19/2019 showed a very newly reduced LV SF with an EF of 30 to 35%, global hypokinesis, grade 1 diastolic dysfunction, elevated left atrial pressure, normal RV systolic function and RV cavity size, moderately elevated PASP, moderate bilateral pleural effusions, mild mitral regurgitation,  trivial aortic insufficiency.  This afternoon at time of cardiology consult he is breathing much better and resting comfortably in the recliner.  BP has improved to the 086V systolic.   He has been weaned off nicardipine and nitroglycerin drips.  Currently on amlodipine, hydralazine, labetalol, and IV furosemide.  Oxygen saturations 100% on room air.  He denies any chest pain throughout any of the above.  There is report of palpitations particularly when he gets excited watching basketball which were more noticeable over the past several weeks when he was not monitoring his food consumption.  Nephrology is monitoring daily for dialysis treatment.  Daughter is at bedside who provides most of the history.  Past Medical History:  Diagnosis Date  . Hyperlipidemia   . Hypertension   . PVD (peripheral vascular disease) ([place])   . Reynolds syndrome [facility])     Past Surgical History:  Procedure Laterality Date  . DIALYSIS/PERMA CATHETER INSERTION N/A 09/26/2019   Procedure: DIALYSIS/PERMA CATHETER INSERTION;  Surgeon: Algernon Huxley, MD;  Location: [place] [facility];  Service: Cardiovascular;  Laterality: N/A;  . ENDOVASCULAR REPAIR/STENT GRAFT N/A 08/14/2019   Procedure: ENDOVASCULAR REPAIR/STENT GRAFT;  Surgeon: Katha Cabal, MD;  Location: [place] [facility];  Service: Cardiovascular;  Laterality: N/A;     Home Meds: Prior to Admission medications   Medication Sig Start Date End Date Taking? Authorizing Provider  acetaminophen (TYLENOL) 500 MG tablet Take 500 mg by mouth every 8 (eight) hours as needed for moderate pain.    Yes [provider]  benzonatate (TESSALON) 100 MG capsule Take by mouth. 11/14/19 11/21/19 Yes [provider]  hydrALAZINE (APRESOLINE) 25 MG tablet Take 1 tablet (25 mg total) by mouth every 8 (eight) hours as needed (for BP >170). Patient taking differently: Take 25-50 mg by mouth 3 (three) times daily as needed (for BP >170).  09/30/19  Yes Fritzi Mandes, MD  irbesartan (AVAPRO) 150 MG tablet SMARTSIG:1 Tablet(s) By Mouth Every Evening 10/22/19  Yes [provider]  labetalol (NORMODYNE) 100 MG tablet Take 1 tablet (100 mg total)  by mouth 2 (two) times daily. 09/30/19  Yes Fritzi Mandes, MD  megestrol (MEGACE) 40 MG/ML suspension Take by mouth. 10/25/19 12/24/19 Yes [provider]  multivitamin (RENA-VIT) TABS tablet Take 1 tablet by mouth at bedtime. 09/30/19  Yes Fritzi Mandes, MD  torsemide (DEMADEX) 20 MG tablet Take 1 tablet (20 mg total) by mouth daily. 10/01/19  Yes Fritzi Mandes, MD  amLODipine (NORVASC) 10 MG tablet Take 1 tablet (10 mg total) by mouth daily. Patient not taking: Reported on 11/18/2019 10/01/19   Fritzi Mandes, MD  amLODipine (NORVASC) 5 MG tablet Take 5 mg by mouth daily. Patient not taking: Reported on 11/19/2019    [provider]  aspirin EC 81 MG tablet Take 81 mg by mouth at bedtime.     [provider]  gabapentin (NEURONTIN) 100 MG capsule Take 100 mg by mouth at bedtime. 11/09/19   [provider]  nicotine (NICODERM CQ - DOSED IN MG/24 HOURS) 21 mg/24hr patch Place 1 patch (21 mg total) onto the skin daily. 10/01/19   Fritzi Mandes, MD    Inpatient Medications: Scheduled Meds: . chlorhexidine  15 mL Mouth Rinse BID  . Chlorhexidine Gluconate Cloth  6 each Topical Q0600  . famotidine  20 mg Oral Daily  . furosemide  60 mg Intravenous BID  . heparin  5,000 Units Subcutaneous Q8H  . labetalol  100 mg Oral BID  .  mouth rinse  15 mL Mouth Rinse q12n4p   Continuous Infusions: . niCARDipine Stopped (11/19/19 0100)  . nitroGLYCERIN Stopped (11/19/19 0200)   PRN Meds: acetaminophen, docusate sodium, labetalol, polyethylene glycol  Allergies:   Allergies  Allergen Reactions  . Ivp Dye [Iodinated Diagnostic Agents]     ESRD patient     Social History:   Social History   Socioeconomic History  . Marital status: Married    Spouse name: Not on file  . Number of children: Not on file  . Years of education: Not on file  . Highest education level: Not on file  Occupational History  . Not on file  Tobacco Use  . Smoking status: Current Every Day Smoker     Packs/day: 0.33    Types: Cigarettes  . Smokeless tobacco: Never Used  . Tobacco comment: 6 cigarettes daily   Vaping Use  . Vaping Use: Never used  Substance and Sexual Activity  . Alcohol use: No  . Drug use: No  . Sexual activity: Yes  Other Topics Concern  . Not on file  Social History Narrative  . Not on file   Social Determinants of Health   Financial Resource Strain:   . Difficulty of Paying Living Expenses:   Food Insecurity:   . Worried About Charity fundraiser in the Last Year:   . Arboriculturist in the Last Year:   Transportation Needs:   . Film/video editor (Medical):   Marland Kitchen Lack of Transportation (Non-Medical):   Physical Activity:   . Days of Exercise per Week:   . Minutes of Exercise per Session:   Stress:   . Feeling of Stress :   Social Connections:   . Frequency of Communication with Friends and Family:   . Frequency of Social Gatherings with Friends and Family:   . Attends Religious Services:   . Active Member of Clubs or Organizations:   . Attends Archivist Meetings:   Marland Kitchen Marital Status:   Intimate Partner Violence:   . Fear of Current or Ex-Partner:   . Emotionally Abused:   Marland Kitchen Physically Abused:   . Sexually Abused:      Family History:   Family History  Problem Relation Age of Onset  . Heart disease Father   . Sudden Cardiac Death Father   . Heart disease Brother   . Hyperlipidemia Brother   . Heart disease Sister   . Heart disease Brother   . Hyperlipidemia Brother   . Hyperlipidemia Brother   . Hyperlipidemia Brother   . Hyperlipidemia Brother   . Hyperlipidemia Brother   . Heart disease Sister     ROS:  Review of Systems  Constitutional: Positive for malaise/fatigue. Negative for chills, diaphoresis, fever and weight loss.  HENT: Negative for congestion.   Eyes: Negative for discharge and redness.  Respiratory: Positive for shortness of breath. Negative for cough, sputum production and wheezing.     Cardiovascular: Positive for palpitations. Negative for chest pain, orthopnea, claudication, leg swelling and PND.  Gastrointestinal: Negative for abdominal pain, heartburn, nausea and vomiting.  Musculoskeletal: Negative for falls and myalgias.  Skin: Negative for rash.  Neurological: Positive for weakness. Negative for dizziness, tingling, tremors, sensory change, speech change, focal weakness and loss of consciousness.  Endo/Heme/Allergies: Does not bruise/bleed easily.  Psychiatric/Behavioral: Negative for substance abuse. The patient is not nervous/anxious.   All other systems reviewed and are negative.     Physical Exam/Data:   Vitals:  11/19/19 1200 11/19/19 1300 11/19/19 1400 11/19/19 1500  BP: (!) 175/68 (!) 155/67  137/72  Pulse: (!) 53 60 72 66  Resp: 13 19 14 14   Temp:    98 F (36.7 C)  TempSrc:      SpO2: 98% 100% 100% 100%  Weight:      Height:        Intake/Output Summary (Last 24 hours) at 11/19/2019 1601 Last data filed at 11/19/2019 0400 Gross per 24 hour  Intake --  Output 3500 ml  Net -3500 ml   Filed Weights   11/18/19 2127 11/19/19 0055  Weight: 59 kg 58.9 kg   Body mass index is 20.96 kg/m.   Physical Exam: General: Well developed, well nourished, in no acute distress. Head: Normocephalic, atraumatic, sclera non-icteric, no xanthomas, nares without discharge.  Neck: Negative for carotid bruits. JVD not elevated. Lungs: Diminished breath sounds along the bilateral bases. Breathing is unlabored. Heart: RRR with S1 S2. II/VI systolic murmur USB, no rubs, or gallops appreciated. Abdomen: Soft, non-tender, non-distended with normoactive bowel sounds. No hepatomegaly. No rebound/guarding. No obvious abdominal masses. Msk:  Strength and tone appear normal for age. Extremities: No clubbing or cyanosis. No edema. Distal pedal pulses are 2+ and equal bilaterally. Neuro: Alert and oriented X 3. No facial asymmetry. No focal deficit. Moves all extremities  spontaneously. Psych:  Responds to questions appropriately with a normal affect.   EKG:  The EKG was personally reviewed and demonstrates: Sinus tachycardia, 126 bpm, nonspecific anterolateral ST-T changes possibly related to early repolarization Telemetry:  Telemetry was personally reviewed and demonstrates: SR with blocked PAC  Weights: [facility] Weights   11/18/19 2127 11/19/19 0055  Weight: 59 kg 58.9 kg    Relevant CV Studies:  2D echo 09/25/2019: 1. Left ventricular ejection fraction, by estimation, is 60 to 65%. The  left ventricle has normal function. The left ventricle has no regional  wall motion abnormalities. There is mild left ventricular hypertrophy.  Left ventricular diastolic parameters  are consistent with Grade II diastolic dysfunction (pseudonormalization).  2. Right ventricular systolic function is normal. The right ventricular  size is normal. There is mildly elevated pulmonary artery systolic  pressure. The estimated right ventricular systolic pressure is 44.3 mmHg.  3. Mild to moderate mitral valve regurgitation.  4. Tricuspid valve regurgitation is moderate. __________  2D echo 11/19/2019: 1. Left ventricular ejection fraction, by estimation, is 30 to 35%. The  left ventricle has moderately decreased function. The left ventricle  demonstrates global hypokinesis. There is mild left ventricular  hypertrophy. Left ventricular diastolic  parameters are consistent with Grade I diastolic dysfunction (impaired  relaxation). Elevated left atrial pressure.  2. Right ventricular systolic function is normal. The right ventricular  size is normal. There is moderately elevated pulmonary artery systolic  pressure.  3. Moderate pleural effusion in both left and right lateral regions.  4. The mitral valve is normal in structure. Mild mitral valve  regurgitation.  5. The aortic valve is tricuspid. Aortic valve regurgitation is trivial.  No aortic stenosis is present.    6. The inferior vena cava is normal in size with <50% respiratory  variability, suggesting right atrial pressure of 8 mmHg.   Laboratory Data:  Chemistry Recent Labs  Lab 11/18/19 2118 11/19/19 0407  NA 135 140  K 4.5 4.3  CL 99 100  CO2 22 26  GLUCOSE 254* 155*  BUN 53* 37*  CREATININE 7.13* 4.97*  CALCIUM 8.3* 9.1  GFRNONAA 7* 11*  GFRAA 8* 12*  ANIONGAP 14 14    Recent Labs  Lab 11/18/19 2118  PROT 7.5  ALBUMIN 3.5  AST 43*  ALT 59*  ALKPHOS 97  BILITOT 0.7   Hematology Recent Labs  Lab 11/18/19 2118  WBC 15.5*  RBC 3.34*  HGB 11.0*  HCT 34.2*  MCV 102.4*  MCH 32.9  MCHC 32.2  RDW 16.2*  PLT 144*   Cardiac EnzymesNo results for input(s): TROPONINI in the last 168 hours. No results for input(s): TROPIPOC in the last 168 hours.  BNP Recent Labs  Lab 11/18/19 2118  BNP >4,500.0*    DDimer No results for input(s): DDIMER in the last 168 hours.  Radiology/Studies:  CT HEAD WO CONTRAST  Result Date: 11/19/2019 FINDINGS: Brain: Mild chronic ischemic white matter disease is noted. No mass effect or midline shift is noted. Ventricular size is within normal limits. There is no evidence of mass lesion, hemorrhage or acute infarction. Vascular: No hyperdense vessel or unexpected calcification. Skull: Normal. Negative for fracture or focal lesion. Sinuses/Orbits: No acute finding. Other: None. Electronically Signed   By: Marijo Conception M.D.   On: 11/19/2019 08:31   DG Chest Port 1 View  Result Date: 11/18/2019 IMPRESSION: 1. Moderate bilateral pleural effusions with bibasilar consolidations. 2. Vascular congestion with moderate to marked diffuse bilateral interstitial and ground-glass opacity, most suspicious for pulmonary edema. Electronically Signed   By: Donavan Foil M.D.   On: 11/18/2019 21:45    Assessment and Plan:   1.  Acute HFrEF/acute pulmonary edema/acute hypoxic/hypercapnic respiratory failure: -Volume status is significantly improved  following emergent hemodialysis -Weaned off BiPAP, currently on room air -Nephrology has changed torsemide to IV Lasix -His cardiomyopathy is of uncertain etiology at this time -Detailed discussion with patient and daughter at bedside regarding ischemic testing options, including benefits, limitations, and risks -With his renal dysfunction chronicity being uncertain at this time they prefer to proceed with Lexiscan MPI initially to evaluate for high risk ischemia with further recommendations pending these images -N.p.o. at midnight -Lexiscan MPI 11/20/2019 -Change labetalol to carvedilol given underlying cardiomyopathy (100mg  bid -->12.5 mg bid, respectively) -With his ARF he is currently not on ACE inhibitor/ARB/Entresto/MRA -Moving forward, consider addition of isosorbide to hydralazine -Following maximally tolerated GDMT recommend repeating echo in approximately 6 to 8 weeks to evaluate for improvement in his cardiomyopathy -CHF education -Daily weights  2.  Elevated troponin: -Mildly elevated and relatively flat trending not consistent with ACS -Likely supply demand ischemia in the setting of volume overload with ARF -No indication for heparin drip at this time -Plan for noninvasive ischemic evaluation as outlined above -Check lipid panel and A1c for further risk stratification -ASA -Add atorvastatin as liver function improves  3.  ARF: -His renal injury dates back to late 08/2019/early 09/2019 and has required hemodialysis since -The chronicity of his hemodialysis remains uncertain -Nephrology on board and monitoring daily for hemodialysis treatment -The clinical course is renal disease takes certainly may dictate cardiac ischemic evaluation options  4.  Hypertensive emergency: -BP much improved -Continue medications as outlined above  5.  AAA: -Follow-up with vascular surgery as directed -ASA  6.  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   Signed, Christell Faith, PA-C [place] Pager: [phone] 11/19/2019, 4:01 PM

## 2019-11-19 NOTE — Progress Notes (Signed)
HD started. 

## 2019-11-20 ENCOUNTER — Inpatient Hospital Stay: Payer: Medicare Other

## 2019-11-20 ENCOUNTER — Other Ambulatory Visit: Payer: Medicare Other

## 2019-11-20 DIAGNOSIS — I5020 Unspecified systolic (congestive) heart failure: Secondary | ICD-10-CM

## 2019-11-20 LAB — LIPID PANEL
Cholesterol: 163 mg/dL (ref 0–200)
HDL: 48 mg/dL (ref >40)
LDL Cholesterol: 90 mg/dL (ref 0–99)
Total CHOL/HDL Ratio: 3.4 RATIO
Triglycerides: 126 mg/dL (ref <150)
VLDL: 25 mg/dL (ref 0–40)

## 2019-11-20 LAB — CBC WITH DIFFERENTIAL/PLATELET
Abs Immature Granulocytes: 0.03 10*3/uL (ref 0.00–0.07)
Basophils Absolute: 0.1 10*3/uL (ref 0.0–0.1)
Basophils Relative: 1 %
Eosinophils Absolute: 0.8 10*3/uL — ABNORMAL HIGH (ref 0.0–0.5)
Eosinophils Relative: 9 %
HCT: 28.6 % — ABNORMAL LOW (ref 39.0–52.0)
Hemoglobin: 9.7 g/dL — ABNORMAL LOW (ref 13.0–17.0)
Immature Granulocytes: 0 %
Lymphocytes Relative: 26 %
Lymphs Abs: 2.3 10*3/uL (ref 0.7–4.0)
MCH: 32.7 pg (ref 26.0–34.0)
MCHC: 33.9 g/dL (ref 30.0–36.0)
MCV: 96.3 fL (ref 80.0–100.0)
Monocytes Absolute: 0.9 10*3/uL (ref 0.1–1.0)
Monocytes Relative: 10 %
Neutro Abs: 4.8 10*3/uL (ref 1.7–7.7)
Neutrophils Relative %: 54 %
Platelets: 132 10*3/uL — ABNORMAL LOW (ref 150–400)
RBC: 2.97 MIL/uL — ABNORMAL LOW (ref 4.22–5.81)
RDW: 16.8 % — ABNORMAL HIGH (ref 11.5–15.5)
WBC: 9 10*3/uL (ref 4.0–10.5)
nRBC: 0 % (ref 0.0–0.2)

## 2019-11-20 LAB — BASIC METABOLIC PANEL
Anion gap: 13 (ref 5–15)
BUN: 54 mg/dL — ABNORMAL HIGH (ref 8–23)
CO2: 26 mmol/L (ref 22–32)
Calcium: 8.6 mg/dL — ABNORMAL LOW (ref 8.9–10.3)
Chloride: 100 mmol/L (ref 98–111)
Creatinine, Ser: 6.97 mg/dL — ABNORMAL HIGH (ref 0.61–1.24)
GFR calc Af Amer: 8 mL/min — ABNORMAL LOW (ref >60)
GFR calc non Af Amer: 7 mL/min — ABNORMAL LOW (ref >60)
Glucose, Bld: 123 mg/dL — ABNORMAL HIGH (ref 70–99)
Potassium: 4 mmol/L (ref 3.5–5.1)
Sodium: 139 mmol/L (ref 135–145)

## 2019-11-20 LAB — MAGNESIUM: Magnesium: 2 mg/dL (ref 1.7–2.4)

## 2019-11-20 LAB — HEPATIC FUNCTION PANEL
ALT: 69 U/L — ABNORMAL HIGH (ref 0–44)
AST: 38 U/L (ref 15–41)
Albumin: 3.2 g/dL — ABNORMAL LOW (ref 3.5–5.0)
Alkaline Phosphatase: 92 U/L (ref 38–126)
Bilirubin, Direct: <0.1 mg/dL (ref 0.0–0.2)
Total Bilirubin: 0.7 mg/dL (ref 0.3–1.2)
Total Protein: 6.5 g/dL (ref 6.5–8.1)

## 2019-11-20 LAB — GLUCOSE, CAPILLARY: Glucose-Capillary: 90 mg/dL (ref 70–99)

## 2019-11-20 LAB — HEPATITIS B SURFACE ANTIGEN: Hepatitis B Surface Ag: NONREACTIVE

## 2019-11-20 LAB — HEMOGLOBIN A1C
Hgb A1c MFr Bld: 5.3 % (ref 4.8–5.6)
Mean Plasma Glucose: 105 mg/dL

## 2019-11-20 LAB — TROPONIN I (HIGH SENSITIVITY): Troponin I (High Sensitivity): 348 ng/L (ref <18)

## 2019-11-20 MED ORDER — LORAZEPAM 2 MG/ML IJ SOLN
1.0000 mg | INTRAMUSCULAR | Status: AC
  Administered 2019-11-20: 1 mg via INTRAVENOUS
  Filled 2019-11-20: qty 1

## 2019-11-20 MED ORDER — IRBESARTAN 150 MG PO TABS
150.0000 mg | ORAL_TABLET | Freq: Every evening | ORAL | Status: DC
  Administered 2019-11-22: 150 mg via ORAL
  Filled 2019-11-20 (×3): qty 1

## 2019-11-20 MED ORDER — RENA-VITE PO TABS
1.0000 | ORAL_TABLET | Freq: Every day | ORAL | Status: DC
  Administered 2019-11-21 – 2019-11-22 (×2): 1 via ORAL
  Filled 2019-11-20 (×4): qty 1

## 2019-11-20 MED ORDER — ALPRAZOLAM 0.25 MG PO TABS
0.2500 mg | ORAL_TABLET | Freq: Once | ORAL | Status: DC
  Filled 2019-11-20: qty 1

## 2019-11-20 MED ORDER — GABAPENTIN 100 MG PO CAPS: 100.0000 mg | ORAL_CAPSULE | Freq: Every day | ORAL | Status: DC

## 2019-11-20 MED ORDER — IPRATROPIUM-ALBUTEROL 0.5-2.5 (3) MG/3ML IN SOLN
3.0000 mL | Freq: Four times a day (QID) | RESPIRATORY_TRACT | Status: DC | PRN
  Administered 2019-11-20: 3 mL via RESPIRATORY_TRACT
  Filled 2019-11-20: qty 3

## 2019-11-20 MED ORDER — HYDRALAZINE HCL 50 MG PO TABS
25.0000 mg | ORAL_TABLET | Freq: Three times a day (TID) | ORAL | Status: DC | PRN
  Administered 2019-11-22: 50 mg via ORAL
  Filled 2019-11-20: qty 1

## 2019-11-20 MED ORDER — LORAZEPAM 2 MG/ML IJ SOLN
0.5000 mg | Freq: Once | INTRAMUSCULAR | Status: AC
  Administered 2019-11-20: 0.5 mg via INTRAVENOUS
  Filled 2019-11-20: qty 1

## 2019-11-20 MED ORDER — LORAZEPAM 2 MG/ML IJ SOLN
1.0000 mg | Freq: Once | INTRAMUSCULAR | Status: AC
  Administered 2019-11-20: 1 mg via INTRAVENOUS
  Filled 2019-11-20: qty 1

## 2019-11-20 MED ORDER — TIOTROPIUM BROMIDE MONOHYDRATE 18 MCG IN CAPS
18.0000 ug | ORAL_CAPSULE | Freq: Every day | RESPIRATORY_TRACT | Status: DC
  Administered 2019-11-20 – 2019-11-23 (×4): 18 ug via RESPIRATORY_TRACT
  Filled 2019-11-20: qty 5

## 2019-11-20 MED ORDER — HALOPERIDOL LACTATE 5 MG/ML IJ SOLN
2.0000 mg | Freq: Once | INTRAMUSCULAR | Status: AC
  Administered 2019-11-20: 2 mg via INTRAVENOUS

## 2019-11-20 MED ORDER — ZOLPIDEM TARTRATE 5 MG PO TABS
5.0000 mg | ORAL_TABLET | Freq: Every evening | ORAL | Status: DC | PRN
  Filled 2019-11-20: qty 1

## 2019-11-20 MED ORDER — ALBUTEROL SULFATE (2.5 MG/3ML) 0.083% IN NEBU: 2.5000 mg | INHALATION_SOLUTION | Freq: Four times a day (QID) | RESPIRATORY_TRACT | Status: DC | PRN

## 2019-11-20 NOTE — Progress Notes (Signed)
Initial Nutrition Assessment  DOCUMENTATION CODES:   Severe malnutrition in context of chronic illness  INTERVENTION:   Nepro Shake po BID, each supplement provides 425 kcal and 19 grams protein  Rena-vite daily   Liberalize diet   NUTRITION DIAGNOSIS:   Severe Malnutrition related to chronic illness (ESRD on HD) as evidenced by severe fat depletion, severe muscle depletion.  GOAL:   Patient will meet greater than or equal to 90% of their needs  MONITOR:   PO intake, Supplement acceptance, Labs, Weight trends, Skin, I & O's  REASON FOR ASSESSMENT:   Malnutrition Screening Tool    ASSESSMENT:   74 y.o. [place] male with acute renal failure from cholesterol emboli requiring hemodialysis that started in May, AAA, hypertension, peripheral vascular disease, CHF and hyperlipidemia who was admitted to [facility] on 11/18/2019 for acute pulmonary edema   Pt is well known to nutrition department and this RD from multiple previous admits. Pt with decreased appetite and oral intake at baseline but has had very poor appetite and oral intake over the past 2 months; pt eating <25% of most meals per family member report. Pt does not really like the hospital food and he eats better when foods are brought from home. Pt also tries to avoid sugar and he does not tolerate Ensure Enlive or Boost well. Pt drinks Premier Protein at home. Pt already ordered for Nepro; can switch to Ensure Max protein if pt does not tolerate Nepro. RD will liberalize pt's diet as pt is not eating enough to exceed nutrient   limits and the renal diet is restrictive.   Per chart, pt ~12lbs above his dry weight of 52.5kg at this time. Pt previously with significant weight loss as his UBW was ~130lbs. Pt has remained fairly weight stable over the past 3 months.   Medications reviewed and include: aspirin, pepcid, lasix, heparin, remeron  Labs reviewed: K 4.0 wnl, BUN 54(H), creat 6.97(H), Mg 2.0 wnl P 4.2 wnl- 6/29 Hgb  9.7(L), Hct 28.6(L)  NUTRITION - FOCUSED PHYSICAL EXAM:    Most Recent Value  Orbital Region Moderate depletion  Upper Arm Region Severe depletion  Thoracic and Lumbar Region Severe depletion  Buccal Region Severe depletion  Temple Region Severe depletion  Clavicle Bone Region Severe depletion  Clavicle and Acromion Bone Region Severe depletion  Scapular Bone Region Severe depletion  Dorsal Hand Severe depletion  Patellar Region Severe depletion  Anterior Thigh Region Severe depletion  Posterior Calf Region Severe depletion  Edema (RD Assessment) Mild  Hair Reviewed  Eyes Reviewed  Mouth Reviewed  Skin Reviewed  Nails Reviewed     Diet Order:   Diet Order            Diet renal with fluid restriction Fluid restriction: 1200 mL Fluid; Room service appropriate? Yes; Fluid consistency: Thin  Diet effective now                EDUCATION NEEDS:   Education needs have been addressed  Skin:  Skin Assessment: Reviewed RN Assessment  Last BM:  6/29- type 2  Height:   Ht Readings from Last 1 Encounters:  11/18/19 5\' 6"  (1.676 m)    Weight:   Wt Readings from Last 1 Encounters:  11/19/19 58.9 kg    Ideal Body Weight:  64.5 kg  BMI:  Body mass index is 20.96 kg/m.  Estimated Nutritional Needs:   Kcal:  1700-2000kcal/day  Protein:  85-100g/day  Fluid:  UOP +1L  Koleen Distance MS,  RD, LDN Please refer to [facility] for RD and/or RD on-call/weekend/after hours pager

## 2019-11-20 NOTE — Progress Notes (Addendum)
PROGRESS NOTE  Scott Oconnor FWY:[id number] DOB: 05/04/1946 DOA: 11/18/2019 PCP: [name], MD   LOS: 2 days   Brief narrative: As per HPI on admission.   74 years old male with history of critical aortic atherosclerosis requiring endograft who has since suffered from kidney injury requiring dialysis in early May. The patient was seemingly well until the evening of presentation with any started noting acute shortness of breath which was progressive in nature.  Patient had missed dialysis for 1 day.  Patient was then brought into the hospital and was saturating 70% on room air and was initially hypertensive with systolic blood pressure in the 200s.   He was placed on CPAP with some improvement but his blood pressure remained very elevated in excess of 200. On arrival to the ED, he was indeed working to breath and so was switched to BiPAP from CPAP. His blood pressure was 230, and nitroglycerin drip was started.  CXR revealed bilateral infiltrates and effusions. His dry weight was 53.5kg by the record, and he apparently was 58.9kg on admittance to the ICU.  Patient was then subsequently transferred out of ICU.   Assessment/Plan:   Active Problems:   Acute pulmonary edema (HCC)  Acute hypoxic and hypercapnic respiratory failure.  Patient was initially in the ICU.  Has been weaned to nasal cannula, still feels dyspneic.  Continue hemodialysis and IV diuresis.  Acute systolic congestive heart failure.  BiPAP at admission.  Continue hemodialysis.  2D echocardiogram from 11/11/2019 with LV ejection fraction of 30 to 35% with grade 1 diastolic dysfunction.  Tinea IV Lasix.  Continue hemodialysis.  Patient required nitroglycerin nicardipine drip in the ICU.  Currently on Coreg.  Patient is on labetalol at home.  Will resume ARB and hydralazine from home.  Cardiology on board and recommended myocardial perfusion scan.  End-stage renal disease on hemodialysis.  Had missed hemodialysis.   Nephrology on board.  Continue hemodialysis   mild leukocytosis.  Likely reactive  Essential hypertension with hypertensive emergency on presentation.  Continue amlodipine hydralazine Coreg and IV Lasix at this time.Marland Kitchen  Has been weaned off nicardipine and nitroglycerin drip.  Continue IV furosemide for now.  Anemia secondary to chronic kidney disease.  Close monitor.  No evidence of bleeding.    Debility, deconditioning.  Will get PT OT evaluation.  Pending.   DVT prophylaxis: heparin injection 5,000 Units Start: 11/19/19 0100   Code Status: Full code  Family Communication: None today.  Status is: Inpatient  Remains inpatient appropriate because:IV treatments appropriate due to intensity of illness or inability to take PO and Inpatient level of care appropriate due to severity of illness   Dispo: The patient is from: Home              Anticipated d/c is to: Home              Anticipated d/c date is: 2 days              Patient currently is not medically stable to d/c.   Consultants:  Nephrology,   PCCM  Procedures:  Hemodialysis  Antibiotics:   None  Anti-infectives (From admission, onward)   None     Subjective: Today, patient was seen and examined at bedside.   Denies any chest pain, fever or chills but feels dyspneic and short of breath at bedside.  Seen during hemodialysis.  Objective: Vitals:   11/20/19 0739 11/20/19 0743  BP:    Pulse:    Resp:  Temp:    SpO2: 94% 96%    Intake/Output Summary (Last 24 hours) at 11/20/2019 0818 Last data filed at 11/20/2019 0433 Gross per 24 hour  Intake --  Output 300 ml  Net -300 ml   Filed Weights   11/18/19 2127 11/19/19 0055  Weight: 59 kg 58.9 kg   Body mass index is 20.96 kg/m.   Physical Exam: GENERAL: Patient is alert awake and communicative,. Not in obvious distress.  Thinly built.   HENT: No scleral pallor or icterus. Pupils equally reactive to light. Oral mucosa is moist NECK: is supple, no  gross swelling noted. CHEST: Diminished breath sounds bilaterally.  Right chest wall Port-A-Cath in place. CVS: S1 and S2 heard, systolic murmur noted regular rate and rhythm.  ABDOMEN: Soft, non-tender, bowel sounds are present. EXTREMITIES: Trace peripheral edema noted. CNS: Cranial nerves are intact. No focal motor deficits. SKIN: warm and dry without rashes.  Data Review: I have personally reviewed the following laboratory data and studies,  CBC: Recent Labs  Lab 11/18/19 2118 11/20/19 0424  WBC 15.5* 9.0  NEUTROABS 6.2 4.8  HGB 11.0* 9.7*  HCT 34.2* 28.6*  MCV 102.4* 96.3  PLT 144* 161*   Basic Metabolic Panel: Recent Labs  Lab 11/18/19 2118 11/19/19 0129 11/19/19 0407 11/20/19 0424  NA 135  --  140 139  K 4.5  --  4.3 4.0  CL 99  --  100 100  CO2 22  --  26 26  GLUCOSE 254*  --  155* 123*  BUN 53*  --  37* 54*  CREATININE 7.13*  --  4.97* 6.97*  CALCIUM 8.3*  --  9.1 8.6*  MG  --  1.9  --  2.0  PHOS  --  4.2  --   --    Liver Function Tests: Recent Labs  Lab 11/18/19 2118 11/20/19 0424  AST 43* 38  ALT 59* 69*  ALKPHOS 97 92  BILITOT 0.7 0.7  PROT 7.5 6.5  ALBUMIN 3.5 3.2*   No results for input(s): LIPASE, AMYLASE in the last 168 hours. No results for input(s): AMMONIA in the last 168 hours. Cardiac Enzymes: No results for input(s): CKTOTAL, CKMB, CKMBINDEX, TROPONINI in the last 168 hours. BNP (last 3 results) Recent Labs    09/29/19 0642 11/18/19 2118  BNP 1,195.0* >4,500.0*    ProBNP (last 3 results) No results for input(s): PROBNP in the last 8760 hours.  CBG: Recent Labs  Lab 11/18/19 2353  GLUCAP 153*   Recent Results (from the past 240 hour(s))  SARS Coronavirus 2 by RT PCR (hospital order, performed in [facility] hospital lab) Nasopharyngeal Nasopharyngeal Swab     Status: None   Collection Time: 11/18/19 10:59 PM   Specimen: Nasopharyngeal Swab  Result Value Ref Range Status   SARS Coronavirus 2 NEGATIVE NEGATIVE Final     Comment: (NOTE) SARS-CoV-2 target nucleic acids are NOT DETECTED.  The SARS-CoV-2 RNA is generally detectable in upper and lower respiratory specimens during the acute phase of infection. The lowest concentration of SARS-CoV-2 viral copies this assay can detect is 250 copies / mL. A negative result does not preclude SARS-CoV-2 infection and should not be used as the sole basis for treatment or other patient management decisions.  A negative result may occur with improper specimen collection / handling, submission of specimen other than nasopharyngeal swab, presence of viral mutation(s) within the areas targeted by this assay, and inadequate number of viral copies (<250 copies / mL). A  negative result must be combined with clinical observations, patient history, and epidemiological information.  Fact Sheet for Patients:   StrictlyIdeas.no  Fact Sheet for Healthcare Providers: BankingDealers.co.za  This test is not yet approved or  cleared by the [place] FDA and has been authorized for detection and/or diagnosis of SARS-CoV-2 by FDA under an Emergency Use Authorization (EUA).  This EUA will remain in effect (meaning this test can be used) for the duration of the COVID-19 declaration under Section 564(b)(1) of the Act, 21 U.S.C. section 360bbb-3(b)(1), unless the authorization is terminated or revoked sooner.  Performed at [facility], [address] [place], [place] [address]   MRSA PCR Screening     Status: None   Collection Time: 11/19/19 12:00 AM   Specimen: Nasopharyngeal  Result Value Ref Range Status   MRSA by PCR NEGATIVE NEGATIVE Final    Comment:        The GeneXpert MRSA Assay (FDA approved for NASAL specimens only), is one component of a comprehensive MRSA colonization surveillance program. It is not intended to diagnose MRSA infection nor to guide or monitor treatment for MRSA  infections. Performed at [facility] [address], [place], [place] [address]      Studies: CT HEAD WO CONTRAST  Result Date: 11/19/2019 CLINICAL DATA:  Ataxia, altered level of consciousness. EXAM: CT HEAD WITHOUT CONTRAST TECHNIQUE: Contiguous axial images were obtained from the base of the skull through the vertex without intravenous contrast. COMPARISON:  None. FINDINGS: Brain: Mild chronic ischemic white matter disease is noted. No mass effect or midline shift is noted. Ventricular size is within normal limits. There is no evidence of mass lesion, hemorrhage or acute infarction. Vascular: No hyperdense vessel or unexpected calcification. Skull: Normal. Negative for fracture or focal lesion. Sinuses/Orbits: No acute finding. Other: None. Electronically Signed   By: Marijo Conception M.D.   On: 11/19/2019 08:31   DG Chest Port 1 View  Result Date: 11/20/2019 CLINICAL DATA:  Acute on chronic respiratory failure. EXAM: PORTABLE CHEST 1 VIEW COMPARISON:  11/18/2019 FINDINGS: The right IJ dialysis catheter is stable. The cardiac silhouette, mediastinal and hilar contours are unchanged. Stable tortuosity and calcification of the thoracic aorta. Persistent diffuse interstitial and airspace process in the lungs and persistent pleural effusions and bibasilar atelectasis. IMPRESSION: 1. Stable support apparatus. 2. Persistent diffuse interstitial and airspace process and persistent pleural effusions and bibasilar atelectasis. Electronically Signed   By: Marijo Sanes M.D.   On: 11/20/2019 05:58   DG Chest Port 1 View  Result Date: 11/18/2019 CLINICAL DATA:  Dyspnea EXAM: PORTABLE CHEST 1 VIEW COMPARISON:  09/29/2019 FINDINGS: Right-sided central venous catheter with tips over the SVC and cavoatrial region. Moderate bilateral pleural effusions. Dense basilar consolidations. Obscured cardiomediastinal silhouette. Vascular congestion with moderate to marked diffuse bilateral interstitial and  ground-glass opacity, likely edema. Aortic atherosclerosis. No pneumothorax. IMPRESSION: 1. Moderate bilateral pleural effusions with bibasilar consolidations. 2. Vascular congestion with moderate to marked diffuse bilateral interstitial and ground-glass opacity, most suspicious for pulmonary edema. Electronically Signed   By: Donavan Foil M.D.   On: 11/18/2019 21:45   ECHOCARDIOGRAM COMPLETE  Result Date: 11/19/2019    ECHOCARDIOGRAM REPORT   Patient Name:   JACKEY HOUSEY Date of Exam: 11/19/2019 Medical Rec #:  [id number]         Height:       66.0 in Accession #:    [id number]        Weight:  129.9 lb Date of Birth:  1945/10/07         BSA:          1.664 m Patient Age:    22 years          BP:           146/70 mmHg Patient Gender: M                 HR:           59 bpm. Exam Location:  [facility] Procedure: 2D Echo, Color Doppler and Cardiac Doppler Indications:     CHF- acute diastolic 921.19  History:         Patient has prior history of Echocardiogram examinations, most                  recent 09/25/2019. Risk Factors:Hypertension and Dyslipidemia.                  PVD.  Sonographer:     [name] RDCS (AE) Referring Phys:  [id number] DONALD BRESCIA Diagnosing Phys: Nelva Bush MD IMPRESSIONS  1. Left ventricular ejection fraction, by estimation, is 30 to 35%. The left ventricle has moderately decreased function. The left ventricle demonstrates global hypokinesis. There is mild left ventricular hypertrophy. Left ventricular diastolic parameters are consistent with Grade I diastolic dysfunction (impaired relaxation). Elevated left atrial pressure.  2. Right ventricular systolic function is normal. The right ventricular size is normal. There is moderately elevated pulmonary artery systolic pressure.  3. Moderate pleural effusion in both left and right lateral regions.  4. The mitral valve is normal in structure. Mild mitral valve regurgitation.  5. The aortic valve is tricuspid. Aortic valve regurgitation  is trivial. No aortic stenosis is present.  6. The inferior vena cava is normal in size with <50% respiratory variability, suggesting right atrial pressure of 8 mmHg. Comparison(s): Prior images reviewed side by side. The left ventricular function is worsened. FINDINGS  Left Ventricle: Left ventricular ejection fraction, by estimation, is 30 to 35%. The left ventricle has moderately decreased function. The left ventricle demonstrates global hypokinesis. The left ventricular internal cavity size was normal in size. There is mild left ventricular hypertrophy. Left ventricular diastolic parameters are consistent with Grade I diastolic dysfunction (impaired relaxation). Elevated left atrial pressure. Right Ventricle: The right ventricular size is normal. No increase in right ventricular wall thickness. Right ventricular systolic function is normal. There is moderately elevated pulmonary artery systolic pressure. The tricuspid regurgitant velocity is 3.52 m/s, and with an assumed right atrial pressure of 8 mmHg, the estimated right ventricular systolic pressure is 44.8 mmHg. Left Atrium: Left atrial size was normal in size. Right Atrium: Right atrial size was normal in size. Pericardium: Trivial pericardial effusion is present. Mitral Valve: The mitral valve is normal in structure. Mild mitral annular calcification. Mild mitral valve regurgitation. Tricuspid Valve: The tricuspid valve is grossly normal. Tricuspid valve regurgitation is mild. Aortic Valve: The aortic valve is tricuspid. Aortic valve regurgitation is trivial. No aortic stenosis is present. Aortic valve mean gradient measures 2.0 mmHg. Aortic valve peak gradient measures 3.8 mmHg. Aortic valve area, by VTI measures 1.68 cm. Pulmonic Valve: The pulmonic valve was not well visualized. Pulmonic valve regurgitation is mild. No evidence of pulmonic stenosis. Aorta: The aortic root is normal in size and structure. Pulmonary Artery: The pulmonary artery is not  well seen. Venous: The inferior vena cava is normal in size with less than 50% respiratory variability, suggesting  right atrial pressure of 8 mmHg. IAS/Shunts: No atrial level shunt detected by color flow Doppler. Additional Comments: There is a moderate pleural effusion in both left and right lateral regions.  LEFT VENTRICLE PLAX 2D LVIDd:         4.72 cm  Diastology LVIDs:         3.33 cm  LV e' lateral:   4.68 cm/s LV PW:         1.18 cm  LV E/e' lateral: 11.9 LV IVS:        0.92 cm  LV e' medial:    2.72 cm/s LVOT diam:     2.00 cm  LV E/e' medial:  20.5 LV SV:         34 LV SV Index:   20 LVOT Area:     3.14 cm  RIGHT VENTRICLE RV Basal diam:  2.66 cm RV S prime:     14.10 cm/s TAPSE (M-mode): 3.5 cm LEFT ATRIUM           Index       RIGHT ATRIUM           Index LA diam:      3.60 cm 2.16 cm/m  RA Area:     13.10 cm LA Vol (A2C): 48.2 ml 28.96 ml/m RA Volume:   34.40 ml  20.67 ml/m LA Vol (A4C): 26.9 ml 16.16 ml/m  AORTIC VALVE                   PULMONIC VALVE AV Area (Vmax):    1.67 cm    PV Vmax:       0.69 m/s AV Area (Vmean):   1.58 cm    PV Peak grad:  1.9 mmHg AV Area (VTI):     1.68 cm AV Vmax:           97.17 cm/s AV Vmean:          68.200 cm/s AV VTI:            0.202 m AV Peak Grad:      3.8 mmHg AV Mean Grad:      2.0 mmHg LVOT Vmax:         51.60 cm/s LVOT Vmean:        34.400 cm/s LVOT VTI:          0.108 m LVOT/AV VTI ratio: 0.53  AORTA Ao Root diam: 3.00 cm MITRAL VALVE               TRICUSPID VALVE MV Area (PHT): 3.65 cm    TR Peak grad:   49.6 mmHg MV Decel Time: 208 msec    TR Vmax:        352.00 cm/s MV E velocity: 55.70 cm/s MV A velocity: 95.10 cm/s  SHUNTS MV E/A ratio:  0.59        Systemic VTI:  0.11 m                            Systemic Diam: 2.00 cm Nelva Bush MD Electronically signed by Nelva Bush MD Signature Date/Time: 11/19/2019/2:00:40 PM    Final       Flora Lipps, MD  [facility] 11/20/2019

## 2019-11-20 NOTE — Progress Notes (Signed)
CHIEF COMPLAINT:   Chief Complaint  Patient presents with  . 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 Respiratory effort normal. HEENT: Welch/AT, PERRLA, no thrush, no stridor. Cardiovascular system: S1 & S2 heard, RRR. No JVD, murmurs, rubs, gallops or clicks. No pedal edema. Gastrointestinal system: Abdomen is nondistended, soft and nontender. No organomegaly or masses felt. Normal bowel sounds heard. Central nervous system: Alert and oriented. No focal neurological deficits. Extremities: Symmetric 5 x 5 power. Skin: No rashes, lesions or ulcers Psychiatry: Judgement and insight appear normal. Mood & affect appropriate.   VITALS:  height is 5\' 6"  (1.676 m) and weight is 58.9 kg. His oral temperature is 97.9 F (36.6 C). His blood pressure is 124/113 (abnormal) and his pulse is 102 (abnormal). His respiration is 28 (abnormal) and oxygen saturation is 96%.   I personally reviewed Labs under Results section.  Radiology Reports CT HEAD WO CONTRAST  Result Date: 11/19/2019 CLINICAL DATA:  Ataxia, altered level of consciousness. EXAM: CT HEAD WITHOUT CONTRAST TECHNIQUE: Contiguous axial images were obtained from the base of the skull through the vertex without intravenous contrast. COMPARISON:  None. FINDINGS:  Brain: Mild chronic ischemic white matter disease is noted. No mass effect or midline shift is noted. Ventricular size is within normal limits. There is no evidence of mass lesion, hemorrhage or acute infarction. Vascular: No hyperdense vessel or unexpected calcification. Skull: Normal. Negative for fracture or focal lesion. Sinuses/Orbits: No acute finding. Other: None. Electronically Signed   By: Marijo Conception M.D.   On: 11/19/2019 08:31   DG Chest Port 1 View  Result Date: 11/20/2019 CLINICAL DATA:  Acute on chronic respiratory failure. EXAM: PORTABLE CHEST 1 VIEW COMPARISON:  11/18/2019 FINDINGS: The right IJ dialysis catheter is stable. The cardiac silhouette, mediastinal and hilar contours are unchanged. Stable tortuosity and calcification of the thoracic aorta. Persistent diffuse interstitial and airspace process in the lungs and persistent pleural effusions and bibasilar atelectasis. IMPRESSION: 1. Stable support apparatus. 2. Persistent diffuse interstitial and airspace process and persistent pleural effusions and bibasilar atelectasis. Electronically Signed   By: Marijo Sanes M.D.   On: 11/20/2019 05:58   DG Chest Port 1 View  Result Date: 11/18/2019 CLINICAL DATA:  Dyspnea EXAM: PORTABLE CHEST 1 VIEW COMPARISON:  09/29/2019 FINDINGS: Right-sided central venous catheter with tips over the SVC and cavoatrial region. Moderate bilateral pleural effusions. Dense basilar consolidations. Obscured cardiomediastinal silhouette. Vascular congestion with moderate to marked diffuse bilateral interstitial and ground-glass opacity, likely edema. Aortic atherosclerosis. No pneumothorax. IMPRESSION: 1. Moderate bilateral pleural effusions with bibasilar consolidations. 2. Vascular congestion with moderate to marked diffuse bilateral interstitial and ground-glass opacity, most suspicious for pulmonary edema. Electronically Signed   By: Donavan Foil M.D.   On: 11/18/2019 21:45   ECHOCARDIOGRAM  COMPLETE  Result Date: 11/19/2019    ECHOCARDIOGRAM REPORT  Patient Name:   Scott Oconnor Date of Exam: 11/19/2019 Medical Rec #:  [id number]         Height:       66.0 in Accession #:    [id number]        Weight:       129.9 lb Date of Birth:  [birth year]         BSA:          1.664 m Patient Age:    74 years          BP:           146/70 mmHg Patient Gender: M                 HR:           59 bpm. Exam Location:  [facility] Procedure: 2D Echo, Color Doppler and Cardiac Doppler Indications:     CHF- acute diastolic 671.24  History:         Patient has prior history of Echocardiogram examinations, most                  recent 09/25/2019. Risk Factors:Hypertension and Dyslipidemia.                  PVD.  Sonographer:     [name] RDCS (AE) Referring Phys:  [id number] DONALD BRESCIA Diagnosing Phys: Nelva Bush MD IMPRESSIONS  1. Left ventricular ejection fraction, by estimation, is 30 to 35%. The left ventricle has moderately decreased function. The left ventricle demonstrates global hypokinesis. There is mild left ventricular hypertrophy. Left ventricular diastolic parameters are consistent with Grade I diastolic dysfunction (impaired relaxation). Elevated left atrial pressure.  2. Right ventricular systolic function is normal. The right ventricular size is normal. There is moderately elevated pulmonary artery systolic pressure.  3. Moderate pleural effusion in both left and right lateral regions.  4. The mitral valve is normal in structure. Mild mitral valve regurgitation.  5. The aortic valve is tricuspid. Aortic valve regurgitation is trivial. No aortic stenosis is present.  6. The inferior vena cava is normal in size with <50% respiratory variability, suggesting right atrial pressure of 8 mmHg. Comparison(s): Prior images reviewed side by side. The left ventricular function is worsened. FINDINGS  Left Ventricle: Left ventricular ejection fraction, by estimation, is 30 to 35%. The left ventricle has moderately  decreased function. The left ventricle demonstrates global hypokinesis. The left ventricular internal cavity size was normal in size. There is mild left ventricular hypertrophy. Left ventricular diastolic parameters are consistent with Grade I diastolic dysfunction (impaired relaxation). Elevated left atrial pressure. Right Ventricle: The right ventricular size is normal. No increase in right ventricular wall thickness. Right ventricular systolic function is normal. There is moderately elevated pulmonary artery systolic pressure. The tricuspid regurgitant velocity is 3.52 m/s, and with an assumed right atrial pressure of 8 mmHg, the estimated right ventricular systolic pressure is 38.2 mmHg. Left Atrium: Left atrial size was normal in size. Right Atrium: Right atrial size was normal in size. Pericardium: Trivial pericardial effusion is present. Mitral Valve: The mitral valve is normal in structure. Mild mitral annular calcification. Mild mitral valve regurgitation. Tricuspid Valve: The tricuspid valve is grossly normal. Tricuspid valve regurgitation is mild. Aortic Valve: The aortic valve is tricuspid. Aortic valve regurgitation is trivial. No aortic stenosis is present. Aortic valve mean gradient measures 2.0 mmHg. Aortic valve peak gradient measures 3.8 mmHg. Aortic valve area, by VTI measures 1.68 cm. Pulmonic Valve: The  pulmonic valve was not well visualized. Pulmonic valve regurgitation is mild. No evidence of pulmonic stenosis. Aorta: The aortic root is normal in size and structure. Pulmonary Artery: The pulmonary artery is not well seen. Venous: The inferior vena cava is normal in size with less than 50% respiratory variability, suggesting right atrial pressure of 8 mmHg. IAS/Shunts: No atrial level shunt detected by color flow Doppler. Additional Comments: There is a moderate pleural effusion in both left and right lateral regions.  LEFT VENTRICLE PLAX 2D LVIDd:         4.72 cm  Diastology LVIDs:          3.33 cm  LV e' lateral:   4.68 cm/s LV PW:         1.18 cm  LV E/e' lateral: 11.9 LV IVS:        0.92 cm  LV e' medial:    2.72 cm/s LVOT diam:     2.00 cm  LV E/e' medial:  20.5 LV SV:         34 LV SV Index:   20 LVOT Area:     3.14 cm  RIGHT VENTRICLE RV Basal diam:  2.66 cm RV S prime:     14.10 cm/s TAPSE (M-mode): 3.5 cm LEFT ATRIUM           Index       RIGHT ATRIUM           Index LA diam:      3.60 cm 2.16 cm/m  RA Area:     13.10 cm LA Vol (A2C): 48.2 ml 28.96 ml/m RA Volume:   34.40 ml  20.67 ml/m LA Vol (A4C): 26.9 ml 16.16 ml/m  AORTIC VALVE                   PULMONIC VALVE AV Area (Vmax):    1.67 cm    PV Vmax:       0.69 m/s AV Area (Vmean):   1.58 cm    PV Peak grad:  1.9 mmHg AV Area (VTI):     1.68 cm AV Vmax:           97.17 cm/s AV Vmean:          68.200 cm/s AV VTI:            0.202 m AV Peak Grad:      3.8 mmHg AV Mean Grad:      2.0 mmHg LVOT Vmax:         51.60 cm/s LVOT Vmean:        34.400 cm/s LVOT VTI:          0.108 m LVOT/AV VTI ratio: 0.53  AORTA Ao Root diam: 3.00 cm MITRAL VALVE               TRICUSPID VALVE MV Area (PHT): 3.65 cm    TR Peak grad:   49.6 mmHg MV Decel Time: 208 msec    TR Vmax:        352.00 cm/s MV E velocity: 55.70 cm/s MV A velocity: 95.10 cm/s  SHUNTS MV E/A ratio:  0.59        Systemic VTI:  0.11 m                            Systemic Diam: 2.00 cm Nelva Bush MD Electronically signed by Nelva Bush MD Signature Date/Time: 11/19/2019/2:00:40 PM    Final  Assessment/Plan:  RESP DISTRESS FROM ACUTE SYSTOLIC CHF NEW ONSET WITH HTN EMERGENCY  WITH ESRD IN HD WITH UNDERLYING COPD  biPAP AS NEEDED OXYGEN AS NEEDED START INH THERAPY   ACUTE SYSTOLIC CARDIAC FAILURE- EF 30% -oxygen as needed -Lasix as tolerated -follow up cardiac enzymes as indicated -follow up cardiology recs  END STAGE KIDNEY INJURY/Renal Failure -continue Foley Catheter-assess need -Avoid nephrotoxic agents -Follow urine output, BMP -Ensure adequate renal  perfusion, optimize oxygenation -Renal dose medications HD as needed    DVT/GI PRX ordered and assessed TRANSFUSIONS AS NEEDED MONITOR FSBS I Assessed the need for Labs I Assessed the need for Foley I Assessed the need for Central Venous Line Family Discussion daily I Assessed the need for Mobilization I made an Assessment of medications to be adjusted accordingly Safety Risk assessment completed  Will follow along   Corrin Parker, M.D.  [facility]  Medical Director [place] Director [facility] Cardio-Pulmonary Department

## 2019-11-20 NOTE — Progress Notes (Signed)
OT Cancellation Note  Patient Details Name: Scott Oconnor MRN: [id number] DOB: 22-Jun-1945   Cancelled Treatment:    Reason Eval/Treat Not Completed: Patient at procedure or test/ unavailable  Pt off floor to HD at this time. Will f/u at later date/time as able for OT evaluation. Thank you.  Gerrianne Scale, [place], OTR/L ascom [phone] 11/20/19, 11:25 AM

## 2019-11-20 NOTE — Progress Notes (Signed)
Pt with increased work of breath with use of accessory muscles to breath respiratory rate upper 30's on 1L O2 via nasal canula; O2 sats in the upper 90's.  Upon assessment pt with inspiratory and expiratory wheezes throughout, denies chest pain. Placed pt back on Bipap, administered duoneb x1 dose, ordered EKG, and repeat CXR.  Pt currently resting comfortably on Bipap with resolution of tachypnea and states he feels better.  Will continue to monitor and assess pt. Pts daughter has arrived at bedside.  Marda Stalker, [place] Pager [phone] (please enter 7 digits) PCCM Consult Pager [phone] (please enter 7 digits)

## 2019-11-20 NOTE — Progress Notes (Signed)
PT Cancellation Note  Patient Details Name: Greogry Goodwyn MRN: [id number] DOB: Jun 20, 1945   Cancelled Treatment:    Reason Eval/Treat Not Completed: Patient at procedure or test/unavailable.  PT consult received.  Chart reviewed.  Pt currently at dialysis.  Will re-attempt PT evaluation at a later date/time as able.  Leitha Bleak, PT 11/20/19, 11:41 AM

## 2019-11-20 NOTE — Progress Notes (Signed)
Pharmacy Electrolyte Monitoring Consult:  Pharmacy consulted to assist in monitoring and replacing electrolytes in this 74 y.o. male admitted on 11/18/2019 with Respiratory Distress   Labs:  Sodium (mmol/L)  Date Value  11/20/2019 139   Potassium (mmol/L)  Date Value  11/20/2019 4.0   Magnesium (mg/dL)  Date Value  11/20/2019 2.0   Phosphorus (mg/dL)  Date Value  11/19/2019 4.2   Calcium (mg/dL)  Date Value  11/20/2019 8.6 (L)   Albumin (g/dL)  Date Value  11/20/2019 3.2 (L)    Assessment/Plan: 74 year old male admitted with respiratory distress following missed HD session. Also with significant hypertension on arrival. Patient recently started on HD, nephrology now planning to increase HD to three times weekly.  Goal replacement: K + 3.5 - 5.0 Mag > 2.0 Phos 2.5 - 4.5  Will follow along with Nephrology for any necessary replacement. Continue to monitor.  Dorena Bodo, PharmD Clinical Pharmacist 11/20/2019 3:48 PM

## 2019-11-20 NOTE — Progress Notes (Signed)
[facility]  ROUNDING NOTE   Subjective:   Daughter at bedside this morning. Patient with shortness of breath, anxiety and required BIPAP. Placed on extra hemodialysis treatment this morning.   Objective:  Vital signs in last 24 hours:  Temp:  [97.9 F (36.6 C)-98.4 F (36.9 C)] (P) 98.2 F (36.8 C) (06/30 0945) Pulse Rate:  [63-102] 68 (06/30 1315) Resp:  [13-29] 22 (06/30 1315) BP: (111-180)/(71-117) 124/93 (06/30 1315) SpO2:  [88 %-100 %] 97 % (06/30 1315) FiO2 (%):  [30 %-40 %] 30 % (06/30 0530)  Weight change:  Filed Weights   11/18/19 2127 11/19/19 0055  Weight: 59 kg 58.9 kg    Intake/Output: I/O last 3 completed shifts: In: -  Out: 3800 [Urine:300; Other:3500]   Intake/Output this shift:  No intake/output data recorded.  Physical Exam: General:  ill  Head: Normocephalic, atraumatic. Moist oral mucosal membranes  Eyes: Anicteric, PERRL  Neck: Supple, trachea midline  Lungs:  Bilateral crackles  Heart: Regular rate and rhythm  Abdomen:  Soft, nontender,   Extremities:  1+ peripheral edema.  Neurologic: Nonfocal, moving all four extremities  Skin: No lesions  Access: RIJ permcath    Basic Metabolic Panel: Recent Labs  Lab 11/18/19 2118 11/19/19 0129 11/19/19 0407 11/20/19 0424  NA 135  --  140 139  K 4.5  --  4.3 4.0  CL 99  --  100 100  CO2 22  --  26 26  GLUCOSE 254*  --  155* 123*  BUN 53*  --  37* 54*  CREATININE 7.13*  --  4.97* 6.97*  CALCIUM 8.3*  --  9.1 8.6*  MG  --  1.9  --  2.0  PHOS  --  4.2  --   --     Liver Function Tests: Recent Labs  Lab 11/18/19 2118 11/20/19 0424  AST 43* 38  ALT 59* 69*  ALKPHOS 97 92  BILITOT 0.7 0.7  PROT 7.5 6.5  ALBUMIN 3.5 3.2*   No results for input(s): LIPASE, AMYLASE in the last 168 hours. No results for input(s): AMMONIA in the last 168 hours.  CBC: Recent Labs  Lab 11/18/19 2118 11/20/19 0424  WBC 15.5* 9.0  NEUTROABS 6.2 4.8  HGB 11.0* 9.7*  HCT 34.2* 28.6*  MCV  102.4* 96.3  PLT 144* 132*    Cardiac Enzymes: No results for input(s): CKTOTAL, CKMB, CKMBINDEX, TROPONINI in the last 168 hours.  BNP: Invalid input(s): POCBNP  CBG: Recent Labs  Lab 11/18/19 2353  GLUCAP 153*    Microbiology: Results for orders placed or performed during the hospital encounter of 11/18/19  SARS Coronavirus 2 by RT PCR (hospital order, performed in [facility] hospital lab) Nasopharyngeal Nasopharyngeal Swab     Status: None   Collection Time: 11/18/19 10:59 PM   Specimen: Nasopharyngeal Swab  Result Value Ref Range Status   SARS Coronavirus 2 NEGATIVE NEGATIVE Final    Comment: (NOTE) SARS-CoV-2 target nucleic acids are NOT DETECTED.  The SARS-CoV-2 RNA is generally detectable in upper and lower respiratory specimens during the acute phase of infection. The lowest concentration of SARS-CoV-2 viral copies this assay can detect is 250 copies / mL. A negative result does not preclude SARS-CoV-2 infection and should not be used as the sole basis for treatment or other patient management decisions.  A negative result may occur with improper specimen collection / handling, submission of specimen other than nasopharyngeal swab, presence of viral mutation(s) within the areas targeted by  this assay, and inadequate number of viral copies (<250 copies / mL). A negative result must be combined with clinical observations, patient history, and epidemiological information.  Fact Sheet for Patients:   StrictlyIdeas.no  Fact Sheet for Healthcare Providers: BankingDealers.co.za  This test is not yet approved or  cleared by the [place] FDA and has been authorized for detection and/or diagnosis of SARS-CoV-2 by FDA under an Emergency Use Authorization (EUA).  This EUA will remain in effect (meaning this test can be used) for the duration of the COVID-19 declaration under Section 564(b)(1) of the Act, 21  U.S.C. section 360bbb-3(b)(1), unless the authorization is terminated or revoked sooner.  Performed at [facility], [address] [place], [place] [address]   MRSA PCR Screening     Status: None   Collection Time: 11/19/19 12:00 AM   Specimen: Nasopharyngeal  Result Value Ref Range Status   MRSA by PCR NEGATIVE NEGATIVE Final    Comment:        The GeneXpert MRSA Assay (FDA approved for NASAL specimens only), is one component of a comprehensive MRSA colonization surveillance program. It is not intended to diagnose MRSA infection nor to guide or monitor treatment for MRSA infections. Performed at [facility], [address] [place], [place] [address]     Coagulation Studies: No results for input(s): LABPROT, INR in the last 72 hours.  Urinalysis: No results for input(s): COLORURINE, LABSPEC, PHURINE, GLUCOSEU, HGBUR, BILIRUBINUR, KETONESUR, PROTEINUR, UROBILINOGEN, NITRITE, LEUKOCYTESUR in the last 72 hours.  Invalid input(s): APPERANCEUR    Imaging: CT HEAD WO CONTRAST  Result Date: 11/19/2019 CLINICAL DATA:  Ataxia, altered level of consciousness. EXAM: CT HEAD WITHOUT CONTRAST TECHNIQUE: Contiguous axial images were obtained from the base of the skull through the vertex without intravenous contrast. COMPARISON:  None. FINDINGS: Brain: Mild chronic ischemic white matter disease is noted. No mass effect or midline shift is noted. Ventricular size is within normal limits. There is no evidence of mass lesion, hemorrhage or acute infarction. Vascular: No hyperdense vessel or unexpected calcification. Skull: Normal. Negative for fracture or focal lesion. Sinuses/Orbits: No acute finding. Other: None. Electronically Signed   By: Marijo Conception M.D.   On: 11/19/2019 08:31   DG Chest Port 1 View  Result Date: 11/20/2019 CLINICAL DATA:  Acute on chronic respiratory failure. EXAM: PORTABLE CHEST 1 VIEW COMPARISON:  11/18/2019 FINDINGS: The right IJ dialysis  catheter is stable. The cardiac silhouette, mediastinal and hilar contours are unchanged. Stable tortuosity and calcification of the thoracic aorta. Persistent diffuse interstitial and airspace process in the lungs and persistent pleural effusions and bibasilar atelectasis. IMPRESSION: 1. Stable support apparatus. 2. Persistent diffuse interstitial and airspace process and persistent pleural effusions and bibasilar atelectasis. Electronically Signed   By: Marijo Sanes M.D.   On: 11/20/2019 05:58   DG Chest Port 1 View  Result Date: 11/18/2019 CLINICAL DATA:  Dyspnea EXAM: PORTABLE CHEST 1 VIEW COMPARISON:  09/29/2019 FINDINGS: Right-sided central venous catheter with tips over the SVC and cavoatrial region. Moderate bilateral pleural effusions. Dense basilar consolidations. Obscured cardiomediastinal silhouette. Vascular congestion with moderate to marked diffuse bilateral interstitial and ground-glass opacity, likely edema. Aortic atherosclerosis. No pneumothorax. IMPRESSION: 1. Moderate bilateral pleural effusions with bibasilar consolidations. 2. Vascular congestion with moderate to marked diffuse bilateral interstitial and ground-glass opacity, most suspicious for pulmonary edema. Electronically Signed   By: Donavan Foil M.D.   On: 11/18/2019 21:45   ECHOCARDIOGRAM COMPLETE  Result Date: 11/19/2019  ECHOCARDIOGRAM REPORT   Patient Name:   YUVIN BUSSIERE Date of Exam: 11/19/2019 Medical Rec #:  [id number]         Height:       66.0 in Accession #:    [id number]        Weight:       129.9 lb Date of Birth:  December 02, 1945         BSA:          1.664 m Patient Age:    74 years          BP:           146/70 mmHg Patient Gender: M                 HR:           59 bpm. Exam Location:  [facility] Procedure: 2D Echo, Color Doppler and Cardiac Doppler Indications:     CHF- acute diastolic 384.66  History:         Patient has prior history of Echocardiogram examinations, most                  recent 09/25/2019. Risk  Factors:Hypertension and Dyslipidemia.                  PVD.  Sonographer:     [name] RDCS (AE) Referring Phys:  [id number] DONALD BRESCIA Diagnosing Phys: Nelva Bush MD IMPRESSIONS  1. Left ventricular ejection fraction, by estimation, is 30 to 35%. The left ventricle has moderately decreased function. The left ventricle demonstrates global hypokinesis. There is mild left ventricular hypertrophy. Left ventricular diastolic parameters are consistent with Grade I diastolic dysfunction (impaired relaxation). Elevated left atrial pressure.  2. Right ventricular systolic function is normal. The right ventricular size is normal. There is moderately elevated pulmonary artery systolic pressure.  3. Moderate pleural effusion in both left and right lateral regions.  4. The mitral valve is normal in structure. Mild mitral valve regurgitation.  5. The aortic valve is tricuspid. Aortic valve regurgitation is trivial. No aortic stenosis is present.  6. The inferior vena cava is normal in size with <50% respiratory variability, suggesting right atrial pressure of 8 mmHg. Comparison(s): Prior images reviewed side by side. The left ventricular function is worsened. FINDINGS  Left Ventricle: Left ventricular ejection fraction, by estimation, is 30 to 35%. The left ventricle has moderately decreased function. The left ventricle demonstrates global hypokinesis. The left ventricular internal cavity size was normal in size. There is mild left ventricular hypertrophy. Left ventricular diastolic parameters are consistent with Grade I diastolic dysfunction (impaired relaxation). Elevated left atrial pressure. Right Ventricle: The right ventricular size is normal. No increase in right ventricular wall thickness. Right ventricular systolic function is normal. There is moderately elevated pulmonary artery systolic pressure. The tricuspid regurgitant velocity is 3.52 m/s, and with an assumed right atrial pressure of 8 mmHg, the estimated  right ventricular systolic pressure is 17.7 mmHg. Left Atrium: Left atrial size was normal in size. Right Atrium: Right atrial size was normal in size. Pericardium: Trivial pericardial effusion is present. Mitral Valve: The mitral valve is normal in structure. Mild mitral annular calcification. Mild mitral valve regurgitation. Tricuspid Valve: The tricuspid valve is grossly normal. Tricuspid valve regurgitation is mild. Aortic Valve: The aortic valve is tricuspid. Aortic valve regurgitation is trivial. No aortic stenosis is present. Aortic valve mean gradient measures 2.0 mmHg. Aortic valve peak gradient measures 3.8 mmHg. Aortic valve area, by VTI measures  1.68 cm. Pulmonic Valve: The pulmonic valve was not well visualized. Pulmonic valve regurgitation is mild. No evidence of pulmonic stenosis. Aorta: The aortic root is normal in size and structure. Pulmonary Artery: The pulmonary artery is not well seen. Venous: The inferior vena cava is normal in size with less than 50% respiratory variability, suggesting right atrial pressure of 8 mmHg. IAS/Shunts: No atrial level shunt detected by color flow Doppler. Additional Comments: There is a moderate pleural effusion in both left and right lateral regions.  LEFT VENTRICLE PLAX 2D LVIDd:         4.72 cm  Diastology LVIDs:         3.33 cm  LV e' lateral:   4.68 cm/s LV PW:         1.18 cm  LV E/e' lateral: 11.9 LV IVS:        0.92 cm  LV e' medial:    2.72 cm/s LVOT diam:     2.00 cm  LV E/e' medial:  20.5 LV SV:         34 LV SV Index:   20 LVOT Area:     3.14 cm  RIGHT VENTRICLE RV Basal diam:  2.66 cm RV S prime:     14.10 cm/s TAPSE (M-mode): 3.5 cm LEFT ATRIUM           Index       RIGHT ATRIUM           Index LA diam:      3.60 cm 2.16 cm/m  RA Area:     13.10 cm LA Vol (A2C): 48.2 ml 28.96 ml/m RA Volume:   34.40 ml  20.67 ml/m LA Vol (A4C): 26.9 ml 16.16 ml/m  AORTIC VALVE                   PULMONIC VALVE AV Area (Vmax):    1.67 cm    PV Vmax:       0.69  m/s AV Area (Vmean):   1.58 cm    PV Peak grad:  1.9 mmHg AV Area (VTI):     1.68 cm AV Vmax:           97.17 cm/s AV Vmean:          68.200 cm/s AV VTI:            0.202 m AV Peak Grad:      3.8 mmHg AV Mean Grad:      2.0 mmHg LVOT Vmax:         51.60 cm/s LVOT Vmean:        34.400 cm/s LVOT VTI:          0.108 m LVOT/AV VTI ratio: 0.53  AORTA Ao Root diam: 3.00 cm MITRAL VALVE               TRICUSPID VALVE MV Area (PHT): 3.65 cm    TR Peak grad:   49.6 mmHg MV Decel Time: 208 msec    TR Vmax:        352.00 cm/s MV E velocity: 55.70 cm/s MV A velocity: 95.10 cm/s  SHUNTS MV E/A ratio:  0.59        Systemic VTI:  0.11 m                            Systemic Diam: 2.00 cm Nelva Bush MD Electronically signed by Nelva Bush MD Signature Date/Time: 11/19/2019/2:00:40  PM    Final      Medications:   . niCARDipine Stopped (11/19/19 0100)  . nitroGLYCERIN Stopped (11/19/19 0200)   . aspirin EC  81 mg Oral Daily  . carvedilol  12.5 mg Oral BID WC  . chlorhexidine  15 mL Mouth Rinse BID  . Chlorhexidine Gluconate Cloth  6 each Topical Q0600  . famotidine  20 mg Oral Daily  . feeding supplement (NEPRO CARB STEADY)  237 mL Oral BID AC & HS  . furosemide  60 mg Intravenous BID  . heparin  5,000 Units Subcutaneous Q8H  . mouth rinse  15 mL Mouth Rinse q12n4p  . mirtazapine  15 mg Oral QHS  . multivitamin  1 tablet Oral QHS  . tiotropium  18 mcg Inhalation Daily     Assessment/ Plan:  Mr. Teshaun Olarte is a 74 y.o. [place] male with acute renal failure from cholesterol emboli requiring hemodialysis, hypertension, peripheral vascular disease, hyperlipidemia, who is admitted to [facility] on 11/18/2019 for Acute pulmonary edema ([facility]) [J81.0] End stage renal disease on dialysis ([place]) [N18.6, Z99.2] Acute respiratory failure with hypoxia ([facility]) [J96.01]   [facility] TTS [facility] RIJ permcath 52.5kg  1. Acute renal failure requiring hemodialysis: emergent hemodialysis treatment on  admission Extra dialysis treatment today for pulmonary edema/fluid overload.  Monitor daily for dialysis treatment Next planned treatment for tomorrow and then resume TTS schedule.   2. Hypertension: hypertensive emergency on admission. Home regimen of amlodipine, hydralazine, labetalol, and torsemide.  Weaned off nicardipine and NTG gtt - IV furosemide.   3. Anemia with renal failure: hemoglobin 9.7. Macrocytic. EPO as outpatient.   4. Secondary Hyperparathyroidism: with hyperphosphatemia. Not currently on binders.    LOS: 2 Alba Perillo 6/30/20212:11 PM

## 2019-11-20 NOTE — Progress Notes (Signed)
Pts daughter concerned pt may be anxious.  I explained due to the fact he is experiencing shortness of breath it is likely this is contributing to his anxiety.  Asked pts daughter has he ever received morphine, however she stated "I do not want him to receive medication like that."  Therefore, will not order 1x low dose of morphine for air hunger at this time. Will continue to monitor and assess pt.    Marda Stalker, [place] Pager [phone] (please enter 7 digits) PCCM Consult Pager [phone] (please enter 7 digits)

## 2019-11-20 NOTE — Progress Notes (Signed)
Cross Cover Brief Note Nurse called reports patient at times agitated and confused.  Tries to get out of bed without calling.  Daughter at bedside and reports her concern as this is not the normal for him and she is concerned that he keeps pulling at his dialysis catheter. Patient is alert and oriented to self but displays confusion and some delirium with current state, place and treatments.  Daughter also reports patient has not slept well since admit even with medication given for sleep.  Patient most recently medicated with haldol and small dose of ativan with no effect, however there was question regarding IV dislodgement  Prior to receiving med.   Nurse also concerned of MRI scheduled and patient ability to tolerate scan without becoming agitated.  Daughter reports, prior to this he has not needed anything prior to scans done but her father has been displaying anxiety over the last day.   Plan:  Patient currently with transfer and bed available on progressive unit.  Sitter has been ordered. Will medicate with low dose ativan for MRI tonight.

## 2019-11-20 NOTE — Progress Notes (Signed)
Hemodialysis patient known at [facility]) TTS 10:30am. Patient daughter very involved, usually transports. Please contact me with any dialysis concerns.  Elvera Bicker Dialysis Coordinator [phone]

## 2019-11-20 NOTE — Progress Notes (Signed)
Progress Note  Patient Name: Scott Oconnor Date of Encounter: 11/20/2019  Primary Cardiologist: [name]  Subjective   He had worsening dyspnea overnight requiring BiPAP with CXR showing persistent diffuse interstitial changes along with persistent pleural effusion. EKG showed sinus rhythm, 99 bpm, LAFB, nonspecific st/t changes. Family feels this was in the setting of anxiety/panic. BP stable. HGB 11.0-->9.7 (baseline appears to be around 9-10). Documented UOP of 3.8 L for the past 24 hours/admission. Now on supplemental oxygen via nasal cannula at 2 L.   Inpatient Medications    Scheduled Meds: . aspirin EC  81 mg Oral Daily  . carvedilol  12.5 mg Oral BID WC  . chlorhexidine  15 mL Mouth Rinse BID  . Chlorhexidine Gluconate Cloth  6 each Topical Q0600  . famotidine  20 mg Oral Daily  . feeding supplement (NEPRO CARB STEADY)  237 mL Oral BID AC & HS  . furosemide  60 mg Intravenous BID  . heparin  5,000 Units Subcutaneous Q8H  . mouth rinse  15 mL Mouth Rinse q12n4p  . mirtazapine  15 mg Oral QHS   Continuous Infusions: . niCARDipine Stopped (11/19/19 0100)  . nitroGLYCERIN Stopped (11/19/19 0200)   PRN Meds: acetaminophen, docusate sodium, ipratropium-albuterol, labetalol, polyethylene glycol   Vital Signs    Vitals:   11/20/19 0500 11/20/19 0600 11/20/19 0739 11/20/19 0743  BP: (!) 124/113     Pulse: 96 (!) 102    Resp: (!) 29 (!) 28    Temp:      TempSrc:      SpO2: 93% 96% 94% 96%  Weight:      Height:        Intake/Output Summary (Last 24 hours) at 11/20/2019 0745 Last data filed at 11/20/2019 0433 Gross per 24 hour  Intake --  Output 300 ml  Net -300 ml   Filed Weights   11/18/19 2127 11/19/19 0055  Weight: 59 kg 58.9 kg    Telemetry    SR with PACs - Personally Reviewed  ECG    sinus rhythm, 99 bpm, LAFB, nonspecific st/t changes - Personally Reviewed  Physical Exam   GEN: Elderly and frail appearing. No acute distress.   Neck: No  JVD. Cardiac: RRR, II/VI systolic murmur USB, no rubs, or gallops.  Respiratory: Diminished breath sounds along the bases bilaterally.  GI: Soft, nontender, non-distended.   MS: No edema; No deformity. Neuro:  Alert and oriented x 3; Nonfocal.  Psych: Normal affect.  Labs    Chemistry Recent Labs  Lab 11/18/19 2118 11/19/19 0407 11/20/19 0424  NA 135 140 139  K 4.5 4.3 4.0  CL 99 100 100  CO2 22 26 26   GLUCOSE 254* 155* 123*  BUN 53* 37* 54*  CREATININE 7.13* 4.97* 6.97*  CALCIUM 8.3* 9.1 8.6*  PROT 7.5  --  6.5  ALBUMIN 3.5  --  3.2*  AST 43*  --  38  ALT 59*  --  69*  ALKPHOS 97  --  92  BILITOT 0.7  --  0.7  GFRNONAA 7* 11* 7*  GFRAA 8* 12* 8*  ANIONGAP 14 14 13      Hematology Recent Labs  Lab 11/18/19 2118 11/20/19 0424  WBC 15.5* 9.0  RBC 3.34* 2.97*  HGB 11.0* 9.7*  HCT 34.2* 28.6*  MCV 102.4* 96.3  MCH 32.9 32.7  MCHC 32.2 33.9  RDW 16.2* 16.8*  PLT 144* 132*    Cardiac EnzymesNo results for input(s): TROPONINI in the last  168 hours. No results for input(s): TROPIPOC in the last 168 hours.   BNP Recent Labs  Lab 11/18/19 2118  BNP >4,500.0*     DDimer No results for input(s): DDIMER in the last 168 hours.   Radiology    CT HEAD WO CONTRAST  Result Date: 11/19/2019 FINDINGS: Brain: Mild chronic ischemic white matter disease is noted. No mass effect or midline shift is noted. Ventricular size is within normal limits. There is no evidence of mass lesion, hemorrhage or acute infarction. Vascular: No hyperdense vessel or unexpected calcification. Skull: Normal. Negative for fracture or focal lesion. Sinuses/Orbits: No acute finding. Other: None. Electronically Signed   By: Marijo Conception M.D.   On: 11/19/2019 08:31   DG Chest Port 1 View  Result Date: 11/20/2019 IMPRESSION: 1. Stable support apparatus. 2. Persistent diffuse interstitial and airspace process and persistent pleural effusions and bibasilar atelectasis. Electronically Signed   By:  Marijo Sanes M.D.   On: 11/20/2019 05:58   DG Chest Port 1 View  Result Date: 11/18/2019 IMPRESSION: 1. Moderate bilateral pleural effusions with bibasilar consolidations. 2. Vascular congestion with moderate to marked diffuse bilateral interstitial and ground-glass opacity, most suspicious for pulmonary edema. Electronically Signed   By: Donavan Foil M.D.   On: 11/18/2019 21:45    Cardiac Studies   2D echo 09/25/2019: 1. Left ventricular ejection fraction, by estimation, is 60 to 65%. The  left ventricle has normal function. The left ventricle has no regional  wall motion abnormalities. There is mild left ventricular hypertrophy.  Left ventricular diastolic parameters  are consistent with Grade II diastolic dysfunction (pseudonormalization).  2. Right ventricular systolic function is normal. The right ventricular  size is normal. There is mildly elevated pulmonary artery systolic  pressure. The estimated right ventricular systolic pressure is 08.6 mmHg.  3. Mild to moderate mitral valve regurgitation.  4. Tricuspid valve regurgitation is moderate. __________  2D echo 11/19/2019: 1. Left ventricular ejection fraction, by estimation, is 30 to 35%. The  left ventricle has moderately decreased function. The left ventricle  demonstrates global hypokinesis. There is mild left ventricular  hypertrophy. Left ventricular diastolic  parameters are consistent with Grade I diastolic dysfunction (impaired  relaxation). Elevated left atrial pressure.  2. Right ventricular systolic function is normal. The right ventricular  size is normal. There is moderately elevated pulmonary artery systolic  pressure.  3. Moderate pleural effusion in both left and right lateral regions.  4. The mitral valve is normal in structure. Mild mitral valve  regurgitation.  5. The aortic valve is tricuspid. Aortic valve regurgitation is trivial.  No aortic stenosis is present.  6. The inferior vena cava is  normal in size with <50% respiratory  variability, suggesting right atrial pressure of 8 mmHg.  Patient Profile     74 y.o. male with history of AAA status post endovascular repair in 11/6193 complicated by ARF requiring HD, Raynaud's disease, HTN, and HLD who is being seen today for the evaluation of acute systolic CHF at the request of Dr. Mortimer Fries.  Assessment & Plan    1. Acute HFrEF/acute pulmonary edema/acute hypoxic/hypercapnic respiratory failure: -Volume status is significantly improved following emergent hemodialysis upon presentation though he had recurrent dyspnea overnight which required BiPAP -Weaned off BiPAP gain and currently on room air -Possible HD today -IV Lasix -His cardiomyopathy is of uncertain etiology at this time -Initially, plans were for a Lexiscan MPI today, though given recurrent dyspnea overnight and further optimization needed, this has been  postponed for now -Family does not want LHC at this time given renal dysfunction  -They would like to proceed with Lexiscan MPI when able and reassess options/plans at that time -If patient is going to have HD today, we will defer Lexiscan MPI until 7/1 -If there are no plans for HD today, they would like to proceed with stress testing today -He will remain NPO for now -Continue Coreg -Add Imdur/hydralazine  -With his ARF he is currently not on ACE inhibitor/ARB/Entresto/MRA -Following maximally tolerated GDMT recommend repeating echo in approximately 6 to 8 weeks to evaluate for improvement in his cardiomyopathy -CHF education -Daily weights  2.  Elevated troponin: -Mildly elevated and relatively flat trending not consistent with ACS -Likely supply demand ischemia in the setting of volume overload with ARF -No indication for heparin drip at this time -Lexiscan MPI deferred at this time as above -Reassess after discussion with nephrology  -LDL 90 this admission -A1c 5.3 -ASA -Add atorvastatin as liver function  improves  3.  ARF: -His renal injury dates back to late 08/2019/early 09/2019 and has required hemodialysis since -The chronicity of his hemodialysis remains uncertain -Nephrology on board and monitoring daily for hemodialysis treatment -The clinical course is renal disease takes certainly may dictate cardiac ischemic evaluation options  4.  Hypertensive emergency: -BP much improved -Continue medications as outlined above  5.  AAA: -Follow-up with vascular surgery as directed -ASA  6.  Elevated LFT: -Likely in the setting of congestive hepatopathy -ALT improved  For questions or updates, please contact [place] Please consult [email or web address] for contact info under Cardiology/STEMI.    Signed, Christell Faith, PA-C [place] Pager: [phone] 11/20/2019, 7:45 AM

## 2019-11-21 ENCOUNTER — Other Ambulatory Visit: Payer: Self-pay

## 2019-11-21 ENCOUNTER — Other Ambulatory Visit: Payer: Medicare Other

## 2019-11-21 ENCOUNTER — Inpatient Hospital Stay: Payer: Medicare Other

## 2019-11-21 DIAGNOSIS — J9621 Acute and chronic respiratory failure with hypoxia: Secondary | ICD-10-CM

## 2019-11-21 DIAGNOSIS — I420 Dilated cardiomyopathy: Secondary | ICD-10-CM

## 2019-11-21 DIAGNOSIS — J9622 Acute and chronic respiratory failure with hypercapnia: Secondary | ICD-10-CM

## 2019-11-21 LAB — CBC WITH DIFFERENTIAL/PLATELET
Abs Immature Granulocytes: 0.04 10*3/uL (ref 0.00–0.07)
Basophils Absolute: 0.1 10*3/uL (ref 0.0–0.1)
Basophils Relative: 1 %
Eosinophils Absolute: 0.5 10*3/uL (ref 0.0–0.5)
Eosinophils Relative: 6 %
HCT: 32 % — ABNORMAL LOW (ref 39.0–52.0)
Hemoglobin: 10.8 g/dL — ABNORMAL LOW (ref 13.0–17.0)
Immature Granulocytes: 0 %
Lymphocytes Relative: 26 %
Lymphs Abs: 2.4 10*3/uL (ref 0.7–4.0)
MCH: 32.3 pg (ref 26.0–34.0)
MCHC: 33.8 g/dL (ref 30.0–36.0)
MCV: 95.8 fL (ref 80.0–100.0)
Monocytes Absolute: 1.2 10*3/uL — ABNORMAL HIGH (ref 0.1–1.0)
Monocytes Relative: 13 %
Neutro Abs: 4.8 10*3/uL (ref 1.7–7.7)
Neutrophils Relative %: 54 %
Platelets: 163 10*3/uL (ref 150–400)
RBC: 3.34 MIL/uL — ABNORMAL LOW (ref 4.22–5.81)
RDW: 16.4 % — ABNORMAL HIGH (ref 11.5–15.5)
WBC: 9 10*3/uL (ref 4.0–10.5)
nRBC: 0 % (ref 0.0–0.2)

## 2019-11-21 LAB — BASIC METABOLIC PANEL
Anion gap: 15 (ref 5–15)
BUN: 39 mg/dL — ABNORMAL HIGH (ref 8–23)
CO2: 26 mmol/L (ref 22–32)
Calcium: 9.4 mg/dL (ref 8.9–10.3)
Chloride: 100 mmol/L (ref 98–111)
Creatinine, Ser: 5.54 mg/dL — ABNORMAL HIGH (ref 0.61–1.24)
GFR calc Af Amer: 11 mL/min — ABNORMAL LOW (ref >60)
GFR calc non Af Amer: 9 mL/min — ABNORMAL LOW (ref >60)
Glucose, Bld: 111 mg/dL — ABNORMAL HIGH (ref 70–99)
Potassium: 4.6 mmol/L (ref 3.5–5.1)
Sodium: 141 mmol/L (ref 135–145)

## 2019-11-21 LAB — MAGNESIUM: Magnesium: 1.9 mg/dL (ref 1.7–2.4)

## 2019-11-21 MED ORDER — LORAZEPAM 2 MG/ML IJ SOLN
1.0000 mg | Freq: Once | INTRAMUSCULAR | Status: AC
  Administered 2019-11-21: 1 mg via INTRAVENOUS
  Filled 2019-11-21: qty 1

## 2019-11-21 NOTE — Progress Notes (Addendum)
Patient lethargic after receiving ativan last night, BP elevated, MD aware.  Patient being transferred to dialysis this morning so BP meds not given prior.  Will reassess after dialysis.

## 2019-11-21 NOTE — Progress Notes (Signed)
Pharmacy Electrolyte Monitoring Consult:  Pharmacy consulted to assist in monitoring and replacing electrolytes in this 74 y.o. male admitted on 11/18/2019 with Respiratory Distress   Labs:  Sodium (mmol/L)  Date Value  11/21/2019 141   Potassium (mmol/L)  Date Value  11/21/2019 4.6   Magnesium (mg/dL)  Date Value  11/21/2019 1.9   Phosphorus (mg/dL)  Date Value  11/19/2019 4.2   Calcium (mg/dL)  Date Value  11/21/2019 9.4   Albumin (g/dL)  Date Value  11/20/2019 3.2 (L)    Assessment/Plan: 74 year old male admitted with respiratory distress following missed HD session. Also with significant hypertension on arrival. Patient recently started on HD, nephrology now planning to increase HD to three times weekly.  Goal replacement: K + 3.5 - 5.0 Mag > 2.0 Phos 2.5 - 4.5  No replenishment warranted x 2 days.  Nephrology following.  Pharmacy will sign off.  Lu Duffel, PharmD, BCPS Clinical Pharmacist 11/21/2019 8:43 AM

## 2019-11-21 NOTE — Progress Notes (Signed)
PT Cancellation Note  Patient Details Name: Scott Oconnor MRN: [id number] DOB: Jun 09, 1945   Cancelled Treatment:    Reason Eval/Treat Not Completed: Patient at procedure or test/unavailable.  Pt currently at dialysis.  Will re-attempt PT evaluation at a later date/time.  Leitha Bleak, PT 11/21/19, 12:30 PM

## 2019-11-21 NOTE — Progress Notes (Signed)
Imaging called for patient's MRI. Patient just received Ativan for agitation ordered by NP Sharion Settler. Daughter has requested patient not have labs drawn nor MRI completed until in the morning when she is at bedside. Message sent to NP Sharion Settler about the situation.

## 2019-11-21 NOTE — Consult Note (Signed)
[facility] TeleNeurology Consult Services  Stat Consult  Date of Service:   11/21/2019 16:48:59  Impression:     .  I63.9 - Cerebrovascular accident (CVA), unspecified mechanism ([place])  Comments/Sign-Out: the MRI shows that he had what looks like cardio emboli in the last couple days last week or 2. This could be either from atheroma on the aortic arch in which case then antiplatelet agents will still be indicated, his echo showed no evidence of cardiac thrombus, I would however check carotid ultrasound anyway, and when he is discharged I would set him up for an outpatient 30 day heart monitor to make sure he was not in paroxysmal A. fib that was not detected during this hospital stay since that would change his medical management. For now I would leave him on an aspirin a day.  Metrics: [facility] Notification Time: 11/21/2019 16:32:41 Stamp Time: 11/21/2019 16:48:59 Callback Response Time: 11/21/2019 16:33:48  Our recommendations are outlined below.   Disposition: Neurology Follow Up Recommended  Sign Out:     .  Discussed with Primary Attending  ----------------------------------------------------------------------------------------------------  Chief Complaint: stroke  History of Present Illness: Patient is a 74 year old Male.  this is a 74 year old male who was admitted on June 28 with shortness of breath. He has had some confusion during this hospital stay. He has been on dialysis since earlier this year. He had an echocardiogram on June 21 that showed an ejection fraction of 30-35% with global hypokinesis. Cardiology consult was obtained he has acute heart failure. MRI was done of the brain which shows subacute to acute infarct in the left centrum semiovale, right thalamus, right cerebellum. He is on an aspirin a day.   Past Medical History:     . Hypertension   Antiplatelet use: asa    Examination: BP(177/121), Pulse(73), Blood Glucose(.) 1A: Level of  Consciousness - Arouses to minor stimulation + 1 1B: Ask Month and Age - 1 Question Right + 1 1C: Blink Eyes & Squeeze Hands - Performs Both Tasks + 0 2: Test Horizontal Extraocular Movements - Normal + 0 3: Test Visual Fields - No Visual Loss + 0 4: Test Facial Palsy (Use Grimace if Obtunded) - Normal symmetry + 0 5A: Test Left Arm Motor Drift - No Drift for 10 Seconds + 0 5B: Test Right Arm Motor Drift - No Drift for 10 Seconds + 0 6A: Test Left Leg Motor Drift - Drift, but doesn't hit bed + 1 6B: Test Right Leg Motor Drift - Drift, but doesn't hit bed + 1 7: Test Limb Ataxia (FNF/Heel-Shin) - No Ataxia + 0 8: Test Sensation - Normal; No sensory loss + 0 9: Test Language/Aphasia - Normal; No aphasia + 0 10: Test Dysarthria - Normal + 0 11: Test Extinction/Inattention - No abnormality + 0  NIHSS Score: 4    Patient is being evaluated for possible acute neurologic impairment and high probability of imminent or life-threatening deterioration. I spent total of 20 minutes providing care to this patient, including time for face to face visit via telemedicine, review of medical records, imaging studies and discussion of findings with providers, the patient and/or family.   Dr Janean Sark   [facility] [phone]  Case [id number]

## 2019-11-21 NOTE — Progress Notes (Signed)
Patient given 1mg  dose of ativan shortly after arriving on unit ordered by NP Sharion Settler. Sitter in room at bedside. Daughter, tanya, requested patient be placed on BiPap if stats drop. She also does not want patient to be disturbed for lab work or MRI early this morning. States he has not slept in two nights and wants him to rest before additional procedures are conducted. We can reach out to her by phone anytime she is not on [facility] for concerns or approval requests for her father's care. Will continue to monitor the patient's status and agitation level for the need of any additional interventions.

## 2019-11-21 NOTE — Progress Notes (Addendum)
PROGRESS NOTE  Scott Oconnor YSA:[id number] DOB: 1946-04-15 DOA: 11/18/2019 PCP: [name], MD   LOS: 3 days   Brief narrative: As per HPI on admission.   74 years old male with history of critical aortic atherosclerosis requiring endograft who has since suffered from kidney injury requiring dialysis in early May. The patient was seemingly well until the evening of presentation with any started noting acute shortness of breath which was progressive in nature.  Patient had missed dialysis for 1 day.  Patient was then brought into the hospital and was saturating 70% on room air and was initially hypertensive with systolic blood pressure in the 200s.   He was placed on CPAP with some improvement but his blood pressure remained very elevated in excess of 200. On arrival to the ED, he was indeed working to breath and so was switched to BiPAP from CPAP. His blood pressure was 230, and nitroglycerin drip was started.  CXR revealed bilateral infiltrates and effusions. His dry weight was 53.5kg by the record, and he apparently was 58.9kg on admittance to the ICU.  Patient was then subsequently transferred out of ICU.   Assessment/Plan:   Active Problems:   Acute pulmonary edema (HCC)   HFrEF (heart failure with reduced ejection fraction) (HCC)   Agitation, confusion likely hospital induced delirium, sleep deprivation.  Patient received Ativan overnight.  Patient was somnolent this morning.  Patient's daughter at bedside.  On one-to-one observation  Acute hypoxic and hypercapnic respiratory failure secondary to hypertensive emergency and pulmonary edema.  Patient was initially in the ICU.  Has been weaned to nasal cannula at 4 litres/min still feels dyspneic.  Continue hemodialysis and IV diuresis.  Patient does have a pleural effusion on the chest x-ray.  Acute systolic congestive heart failure.  Patient received BiPAP at admission.  Continue hemodialysis.  2D echocardiogram from 11/11/2019  with LV ejection fraction of 30 to 35% with grade 1 diastolic dysfunction.  Continue IV Lasix.  Continue hemodialysis.  Patient required nitroglycerin nicardipine drip in the ICU.  Currently on Coreg.  Patient is on labetalol at home.  Resumed ARB and hydralazine from home.  Cardiology on board and recommended myocardial perfusion scan due to somnolence and difficulty lying down it will be postponed until tomorrow  End-stage renal disease on hemodialysis.  Had missed hemodialysis.  Nephrology on board.  Continue hemodialysis as per nephrology.  mild leukocytosis.  Likely reactive.  WBC has normalized at this time.  Essential hypertension with hypertensive emergency on presentation.  Continue ARB, hydralazine Coreg and IV Lasix at this time.Marland Kitchen  Has been weaned off nicardipine and nitroglycerin drip.    Anemia secondary to chronic kidney disease.  Closely monitor.  No evidence of bleeding.    Debility, deconditioning.  Will get PT, OT evaluation pending.   DVT prophylaxis: heparin injection 5,000 Units Start: 11/19/19 0100   Addendum:  11/21/2019 4:29 PM  MRI of the brain performed today was motion degraded but showed areas of possible small acute to subacute infarcts of the left centrum semiovale, right thalamus and right cerebellum.  I spoke with telemetry neurology for consultation.   Code Status: Full code  Family Communication: Spoke with the patient's daughter at bedside at length including Dr. [name] cardiology.  Status is: Inpatient  Remains inpatient appropriate because:IV treatments appropriate due to intensity of illness or inability to take PO and Inpatient level of care appropriate due to severity of illness, further cardiac work-up, increasing agitation   Dispo: The patient is  from: Home              Anticipated d/c is to: Home home health/undetermined              Anticipated d/c date is: 2 to 3 days.              Patient currently is not medically stable to  d/c.   Consultants:  Nephrology,   PCCM  Cardiology  Procedures:  Hemodialysis  BiPAP  Antibiotics:  . None  Anti-infectives (From admission, onward)   None     Subjective: Today, patient was seen and examined at bedside.  Appears to be agitated at times but received 3 doses of Ativan this morning due to agitation overnight.  Surveyor, quantity at bedside.  Patient's daughter at bedside as well.  denies chest pain.  Objective: Vitals:   11/21/19 0930 11/21/19 1300  BP: (!) 177/121   Pulse: 73 77  Resp:    Temp: 98.1 F (36.7 C) (!) 97.4 F (36.3 C)  SpO2: 90%     Intake/Output Summary (Last 24 hours) at 11/21/2019 1326 Last data filed at 11/21/2019 0900 Gross per 24 hour  Intake 27.5 ml  Output 3000 ml  Net -2972.5 ml   Filed Weights   11/18/19 2127 11/19/19 0055 11/21/19 0500  Weight: 59 kg 58.9 kg 52.2 kg   Body mass index is 18.57 kg/m.   Physical Exam: GENERAL: Patient is somnolent at times, on nasal cannula oxygen, thinly built, agitated at times.  Elderly and frail-appearing male HENT: No scleral pallor or icterus. Pupils equally reactive to light. Oral mucosa is moist NECK: is supple, no gross swelling noted. CHEST: Diminished breath sounds bilaterally especially over the bases.  Right internal jugular permacath in place  CVS: S1 and S2 heard, systolic murmur noted, regular rate and rhythm.  ABDOMEN: Soft, non-tender, bowel sounds are present. EXTREMITIES: Trace peripheral edema noted. CNS: Somnolent.  Moving all extremities. SKIN: warm and dry without rashes.  Data Review: I have personally reviewed the following laboratory data and studies,  CBC: Recent Labs  Lab 11/18/19 2118 11/20/19 0424 11/21/19 0730  WBC 15.5* 9.0 9.0  NEUTROABS 6.2 4.8 4.8  HGB 11.0* 9.7* 10.8*  HCT 34.2* 28.6* 32.0*  MCV 102.4* 96.3 95.8  PLT 144* 132* 570   Basic Metabolic Panel: Recent Labs  Lab 11/18/19 2118 11/19/19 0129 11/19/19 0407 11/20/19 0424  11/21/19 0730  NA 135  --  140 139 141  K 4.5  --  4.3 4.0 4.6  CL 99  --  100 100 100  CO2 22  --  26 26 26   GLUCOSE 254*  --  155* 123* 111*  BUN 53*  --  37* 54* 39*  CREATININE 7.13*  --  4.97* 6.97* 5.54*  CALCIUM 8.3*  --  9.1 8.6* 9.4  MG  --  1.9  --  2.0 1.9  PHOS  --  4.2  --   --   --    Liver Function Tests: Recent Labs  Lab 11/18/19 2118 11/20/19 0424  AST 43* 38  ALT 59* 69*  ALKPHOS 97 92  BILITOT 0.7 0.7  PROT 7.5 6.5  ALBUMIN 3.5 3.2*   No results for input(s): LIPASE, AMYLASE in the last 168 hours. No results for input(s): AMMONIA in the last 168 hours. Cardiac Enzymes: No results for input(s): CKTOTAL, CKMB, CKMBINDEX, TROPONINI in the last 168 hours. BNP (last 3 results) Recent Labs    09/29/19 0642 11/18/19  2118  BNP 1,195.0* >4,500.0*    ProBNP (last 3 results) No results for input(s): PROBNP in the last 8760 hours.  CBG: Recent Labs  Lab 11/18/19 2353 11/20/19 1442  GLUCAP 153* 90   Recent Results (from the past 240 hour(s))  SARS Coronavirus 2 by RT PCR (hospital order, performed in [facility] hospital lab) Nasopharyngeal Nasopharyngeal Swab     Status: None   Collection Time: 11/18/19 10:59 PM   Specimen: Nasopharyngeal Swab  Result Value Ref Range Status   SARS Coronavirus 2 NEGATIVE NEGATIVE Final    Comment: (NOTE) SARS-CoV-2 target nucleic acids are NOT DETECTED.  The SARS-CoV-2 RNA is generally detectable in upper and lower respiratory specimens during the acute phase of infection. The lowest concentration of SARS-CoV-2 viral copies this assay can detect is 250 copies / mL. A negative result does not preclude SARS-CoV-2 infection and should not be used as the sole basis for treatment or other patient management decisions.  A negative result may occur with improper specimen collection / handling, submission of specimen other than nasopharyngeal swab, presence of viral mutation(s) within the areas targeted by this assay, and  inadequate number of viral copies (<250 copies / mL). A negative result must be combined with clinical observations, patient history, and epidemiological information.  Fact Sheet for Patients:   StrictlyIdeas.no  Fact Sheet for Healthcare Providers: BankingDealers.co.za  This test is not yet approved or  cleared by the [place] FDA and has been authorized for detection and/or diagnosis of SARS-CoV-2 by FDA under an Emergency Use Authorization (EUA).  This EUA will remain in effect (meaning this test can be used) for the duration of the COVID-19 declaration under Section 564(b)(1) of the Act, 21 U.S.C. section 360bbb-3(b)(1), unless the authorization is terminated or revoked sooner.  Performed at [facility], [address] [place], [place] [address]   MRSA PCR Screening     Status: None   Collection Time: 11/19/19 12:00 AM   Specimen: Nasopharyngeal  Result Value Ref Range Status   MRSA by PCR NEGATIVE NEGATIVE Final    Comment:        The GeneXpert MRSA Assay (FDA approved for NASAL specimens only), is one component of a comprehensive MRSA colonization surveillance program. It is not intended to diagnose MRSA infection nor to guide or monitor treatment for MRSA infections. Performed at [facility], [address], [place], [place] [address]      Studies: [facility] Chest [place] 1 View  Result Date: 11/20/2019 CLINICAL DATA:  Acute on chronic respiratory failure. EXAM: PORTABLE CHEST 1 VIEW COMPARISON:  11/18/2019 FINDINGS: The right IJ dialysis catheter is stable. The cardiac silhouette, mediastinal and hilar contours are unchanged. Stable tortuosity and calcification of the thoracic aorta. Persistent diffuse interstitial and airspace process in the lungs and persistent pleural effusions and bibasilar atelectasis. IMPRESSION: 1. Stable support apparatus. 2. Persistent diffuse interstitial and airspace process  and persistent pleural effusions and bibasilar atelectasis. Electronically Signed   By: Marijo Sanes M.D.   On: 11/20/2019 05:58      Flora Lipps, MD  [facility] 11/21/2019

## 2019-11-21 NOTE — Progress Notes (Signed)
Daughter will come this morning to be at patient bedside. Once arrives, MRI and labs should be able to be drawn. NP Sharion Settler came to floor and spoke with me about the situation and agrees that patient is calmer in daughters presence. I will call the daughter at change of shift to verify her time of arrival.

## 2019-11-21 NOTE — Progress Notes (Signed)
OT Cancellation Note  Patient Details Name: Scott Oconnor MRN: [id number] DOB: 02-03-1946   Cancelled Treatment:    Reason Eval/Treat Not Completed: Patient at procedure or test/ unavailable. Chart reviewed. Spoke with RN by phone. Pt being transferred to dialysis at this time and unavailable for OT evaluation. Will re-attempt at later date/time as pt is available and medically appropriate.   Jeni Salles, MPH, MS, OTR/L ascom [phone] 11/21/19, 9:16 AM

## 2019-11-21 NOTE — Progress Notes (Signed)
Progress Note  Patient Name: Scott Oconnor Date of Encounter: 11/21/2019  Primary Cardiologist: [name]  Subjective   Transferred out of the ICU to progressive care unit. Somnolent this morning. Has not slept much this admission. Has received 3 mg of Ativan overnight. Has not complained to chest pain or dyspnea to family. BP elevated this morning. Unable to complete MRI brain at this time per family request/movement.   Inpatient Medications    Scheduled Meds: . ALPRAZolam  0.25 mg Oral Once  . aspirin EC  81 mg Oral Daily  . carvedilol  12.5 mg Oral BID WC  . chlorhexidine  15 mL Mouth Rinse BID  . Chlorhexidine Gluconate Cloth  6 each Topical Q0600  . famotidine  20 mg Oral Daily  . feeding supplement (NEPRO CARB STEADY)  237 mL Oral BID AC & HS  . furosemide  60 mg Intravenous BID  . heparin  5,000 Units Subcutaneous Q8H  . irbesartan  150 mg Oral QPM  . mouth rinse  15 mL Mouth Rinse q12n4p  . mirtazapine  15 mg Oral QHS  . multivitamin  1 tablet Oral QHS  . tiotropium  18 mcg Inhalation Daily   Continuous Infusions:  PRN Meds: acetaminophen, albuterol, docusate sodium, hydrALAZINE, ipratropium-albuterol, labetalol, polyethylene glycol, zolpidem   Vital Signs    Vitals:   11/20/19 1956 11/20/19 2043 11/21/19 0500 11/21/19 0755  BP: 116/89 (!) 158/108  (!) 190/117  Pulse: 81 100  67  Resp: 13 16  20   Temp: 98.6 F (37 C) 98 F (36.7 C)  97.7 F (36.5 C)  TempSrc: Oral Oral  Axillary  SpO2: 100%   (!) 89%  Weight:   52.2 kg   Height:        Intake/Output Summary (Last 24 hours) at 11/21/2019 0854 Last data filed at 11/20/2019 1700 Gross per 24 hour  Intake 27.5 ml  Output 3000 ml  Net -2972.5 ml   Filed Weights   11/18/19 2127 11/19/19 0055 11/21/19 0500  Weight: 59 kg 58.9 kg 52.2 kg    Telemetry    SR with PACs - Personally Reviewed  ECG    No new tracings - Personally Reviewed  Physical Exam   GEN: Elderly and frail appearing. No acute  distress.   Neck: No JVD. Cardiac: RRR, II/VI systolic murmur USB, no rubs, or gallops.  Respiratory: Diminished breath sounds along the bases bilaterally.  GI: Soft, nontender, non-distended.   MS: No edema; No deformity. Neuro:  Somnolent.  Psych: Somnolent.  Labs    Chemistry Recent Labs  Lab 11/18/19 2118 11/18/19 2118 11/19/19 0407 11/20/19 0424 11/21/19 0730  NA 135   < > 140 139 141  K 4.5   < > 4.3 4.0 4.6  CL 99   < > 100 100 100  CO2 22   < > 26 26 26   GLUCOSE 254*   < > 155* 123* 111*  BUN 53*   < > 37* 54* 39*  CREATININE 7.13*   < > 4.97* 6.97* 5.54*  CALCIUM 8.3*   < > 9.1 8.6* 9.4  PROT 7.5  --   --  6.5  --   ALBUMIN 3.5  --   --  3.2*  --   AST 43*  --   --  38  --   ALT 59*  --   --  69*  --   ALKPHOS 97  --   --  92  --  BILITOT 0.7  --   --  0.7  --   GFRNONAA 7*   < > 11* 7* 9*  GFRAA 8*   < > 12* 8* 11*  ANIONGAP 14   < > 14 13 15    < > = values in this interval not displayed.     Hematology Recent Labs  Lab 11/18/19 2118 11/20/19 0424 11/21/19 0730  WBC 15.5* 9.0 9.0  RBC 3.34* 2.97* 3.34*  HGB 11.0* 9.7* 10.8*  HCT 34.2* 28.6* 32.0*  MCV 102.4* 96.3 95.8  MCH 32.9 32.7 32.3  MCHC 32.2 33.9 33.8  RDW 16.2* 16.8* 16.4*  PLT 144* 132* 163    Cardiac EnzymesNo results for input(s): TROPONINI in the last 168 hours. No results for input(s): TROPIPOC in the last 168 hours.   BNP Recent Labs  Lab 11/18/19 2118  BNP >4,500.0*     DDimer No results for input(s): DDIMER in the last 168 hours.   Radiology    CT HEAD WO CONTRAST  Result Date: 11/19/2019 FINDINGS: Brain: Mild chronic ischemic white matter disease is noted. No mass effect or midline shift is noted. Ventricular size is within normal limits. There is no evidence of mass lesion, hemorrhage or acute infarction. Vascular: No hyperdense vessel or unexpected calcification. Skull: Normal. Negative for fracture or focal lesion. Sinuses/Orbits: No acute finding. Other: None.  Electronically Signed   By: Marijo Conception M.D.   On: 11/19/2019 08:31   DG Chest Port 1 View  Result Date: 11/20/2019 IMPRESSION: 1. Stable support apparatus. 2. Persistent diffuse interstitial and airspace process and persistent pleural effusions and bibasilar atelectasis. Electronically Signed   By: Marijo Sanes M.D.   On: 11/20/2019 05:58   DG Chest Port 1 View  Result Date: 11/18/2019 IMPRESSION: 1. Moderate bilateral pleural effusions with bibasilar consolidations. 2. Vascular congestion with moderate to marked diffuse bilateral interstitial and ground-glass opacity, most suspicious for pulmonary edema. Electronically Signed   By: Donavan Foil M.D.   On: 11/18/2019 21:45    Cardiac Studies   2D echo 09/25/2019: 1. Left ventricular ejection fraction, by estimation, is 60 to 65%. The  left ventricle has normal function. The left ventricle has no regional  wall motion abnormalities. There is mild left ventricular hypertrophy.  Left ventricular diastolic parameters  are consistent with Grade II diastolic dysfunction (pseudonormalization).  2. Right ventricular systolic function is normal. The right ventricular  size is normal. There is mildly elevated pulmonary artery systolic  pressure. The estimated right ventricular systolic pressure is 38.1 mmHg.  3. Mild to moderate mitral valve regurgitation.  4. Tricuspid valve regurgitation is moderate. __________  2D echo 11/19/2019: 1. Left ventricular ejection fraction, by estimation, is 30 to 35%. The  left ventricle has moderately decreased function. The left ventricle  demonstrates global hypokinesis. There is mild left ventricular  hypertrophy. Left ventricular diastolic  parameters are consistent with Grade I diastolic dysfunction (impaired  relaxation). Elevated left atrial pressure.  2. Right ventricular systolic function is normal. The right ventricular  size is normal. There is moderately elevated pulmonary artery systolic   pressure.  3. Moderate pleural effusion in both left and right lateral regions.  4. The mitral valve is normal in structure. Mild mitral valve  regurgitation.  5. The aortic valve is tricuspid. Aortic valve regurgitation is trivial.  No aortic stenosis is present.  6. The inferior vena cava is normal in size with <50% respiratory  variability, suggesting right atrial pressure of 8 mmHg.  Patient Profile     75 y.o. male with history of AAA status post endovascular repair in 10/1681 complicated by ARF requiring HD, Raynaud's disease, HTN, and HLD who is being seen today for the evaluation of acute systolic CHF at the request of Dr. Mortimer Fries.  Assessment & Plan    1. Acute HFrEF/acute pulmonary edema/acute hypoxic/hypercapnic respiratory failure: -Volume status is significantly improved following emergent hemodialysis upon presentation  -He remains volume up -HD planned for this morning -IV Lasix -His cardiomyopathy is of uncertain etiology at this time -Initially, plans were for a Lexiscan MPI 6/30, though given recurrent dyspnea and further optimization needed, this was been postponed -With increased somnolent, volume overload, and patient movement without following commands there are concerns he may not be able to sit still for imaging, in this setting Myoview is deferred for today -Family does not want LHC at this time given renal dysfunction  -They would like to proceed with Lexiscan MPI when able and reassess options/plans at that time -With his somnolence, he may not be able to take oral medication, and therefore we may need to change Coreg for now -Continue Coreg --Irbesartan was added this morning, though he may not be able to take oral medications -With his ARF he is currently not on Entresto/MRA -Following maximally tolerated GDMT recommend repeating echo in approximately 6 to 8 weeks to evaluate for improvement in his cardiomyopathy -CHF education -Daily weights  2.   Elevated troponin: -Mildly elevated and relatively flat trending not consistent with ACS -Likely supply demand ischemia in the setting of volume overload with ARF -No indication for heparin drip at this time -Lexiscan MPI deferred at this time as above -Reassess after discussion with nephrology  -LDL 90 this admission -A1c 5.3 -ASA -Add atorvastatin as liver function improves  3.  ARF: -His renal injury dates back to late 08/2019/early 09/2019 and has required hemodialysis since -The chronicity of his hemodialysis remains uncertain -Nephrology on board and monitoring daily for hemodialysis treatment -The clinical course is renal disease takes certainly may dictate cardiac ischemic evaluation options  4.  Hypertensive emergency: -BP elevated this morning -HD this morning -May need to transition medications to IV given somnolence   5.  AAA: -Follow-up with vascular surgery as directed -ASA  6.  Elevated LFT: -Likely in the setting of congestive hepatopathy -ALT improved  For questions or updates, please contact [place] Please consult [email or web address] for contact info under Cardiology/STEMI.    Signed, Christell Faith, PA-C [place] Pager: [phone] 11/21/2019, 8:54 AM

## 2019-11-21 NOTE — Progress Notes (Signed)
Patient's daughter refusing all medications unless verified by Dr. [name] and Dr. Juleen China. Daughter states she does not want him on so many medications.

## 2019-11-21 NOTE — Progress Notes (Signed)
[facility]  ROUNDING NOTE   Subjective:   Seen and examined on hemodialysis treatment this morning. UF of 3 liters.   Patient is more confused today. MRI suggestive of multiple acute strokes.     HEMODIALYSIS FLOWSHEET:  Blood Flow Rate (mL/min): 400 mL/min Arterial Pressure (mmHg): -170 mmHg Venous Pressure (mmHg): 120 mmHg Transmembrane Pressure (mmHg): 60 mmHg Ultrafiltration Rate (mL/min): 0.87 mL/min Dialysate Flow Rate (mL/min): 600 ml/min Conductivity: Machine : 14.1 Conductivity: Machine : 14.1 Dialysis Fluid Bolus: Normal Saline Bolus Amount (mL):  (250)    Objective:  Vital signs in last 24 hours:  Temp:  [97.4 F (36.3 C)-98.6 F (37 C)] 97.4 F (36.3 C) (07/01 1300) Pulse Rate:  [67-100] 77 (07/01 1300) Resp:  [13-20] 20 (07/01 0755) BP: (116-190)/(60-121) 140/104 (07/01 1500) SpO2:  [89 %-100 %] 100 % (07/01 1338) Weight:  [49.4 kg-52.2 kg] 49.4 kg (07/01 1338)  Weight change:  Filed Weights   11/19/19 0055 11/21/19 0500 11/21/19 1338  Weight: 58.9 kg 52.2 kg 49.4 kg    Intake/Output: I/O last 3 completed shifts: In: 27.5 [I.V.:27.5] Out: 3300 [Urine:300; Other:3000]   Intake/Output this shift:  Total I/O In: 0  Out: -497   Physical Exam: General: Laying in bed  Head: Normocephalic, atraumatic. Moist oral mucosal membranes  Eyes: Anicteric, PERRL  Neck: Supple, trachea midline  Lungs:  clear  Heart: Regular rate and rhythm  Abdomen:  Soft, nontender,   Extremities:  trace peripheral edema.  Neurologic: Nonfocal, moving all four extremities  Skin: No lesions  Access: RIJ permcath    Basic Metabolic Panel: Recent Labs  Lab 11/18/19 2118 11/18/19 2118 11/19/19 0129 11/19/19 0407 11/20/19 0424 11/21/19 0730  NA 135  --   --  140 139 141  K 4.5  --   --  4.3 4.0 4.6  CL 99  --   --  100 100 100  CO2 22  --   --  26 26 26   GLUCOSE 254*  --   --  155* 123* 111*  BUN 53*  --   --  37* 54* 39*  CREATININE 7.13*  --    --  4.97* 6.97* 5.54*  CALCIUM 8.3*   < >  --  9.1 8.6* 9.4  MG  --   --  1.9  --  2.0 1.9  PHOS  --   --  4.2  --   --   --    < > = values in this interval not displayed.    Liver Function Tests: Recent Labs  Lab 11/18/19 2118 11/20/19 0424  AST 43* 38  ALT 59* 69*  ALKPHOS 97 92  BILITOT 0.7 0.7  PROT 7.5 6.5  ALBUMIN 3.5 3.2*   No results for input(s): LIPASE, AMYLASE in the last 168 hours. No results for input(s): AMMONIA in the last 168 hours.  CBC: Recent Labs  Lab 11/18/19 2118 11/20/19 0424 11/21/19 0730  WBC 15.5* 9.0 9.0  NEUTROABS 6.2 4.8 4.8  HGB 11.0* 9.7* 10.8*  HCT 34.2* 28.6* 32.0*  MCV 102.4* 96.3 95.8  PLT 144* 132* 163    Cardiac Enzymes: No results for input(s): CKTOTAL, CKMB, CKMBINDEX, TROPONINI in the last 168 hours.  BNP: Invalid input(s): POCBNP  CBG: Recent Labs  Lab 11/18/19 2353 11/20/19 1442  GLUCAP 153* 90    Microbiology: Results for orders placed or performed during the hospital encounter of 11/18/19  SARS Coronavirus 2 by RT PCR (hospital order, performed in [facility] hospital lab) Nasopharyngeal Nasopharyngeal Swab     Status: None   Collection Time: 11/18/19 10:59 PM   Specimen: Nasopharyngeal Swab  Result Value Ref Range Status   SARS Coronavirus 2 NEGATIVE NEGATIVE Final    Comment: (NOTE) SARS-CoV-2 target nucleic acids are NOT DETECTED.  The SARS-CoV-2 RNA is generally detectable in upper and lower respiratory specimens during the acute phase of infection. The lowest concentration of SARS-CoV-2 viral copies this assay can detect is 250 copies / mL. A negative result does not preclude SARS-CoV-2 infection and should not be used as the sole basis for treatment or other patient management decisions.  A negative result may occur with improper specimen collection / handling, submission of specimen other than nasopharyngeal swab, presence of viral mutation(s) within the areas targeted by this assay, and inadequate  number of viral copies (<250 copies / mL). A negative result must be combined with clinical observations, patient history, and epidemiological information.  Fact Sheet for Patients:   StrictlyIdeas.no  Fact Sheet for Healthcare Providers: BankingDealers.co.za  This test is not yet approved or  cleared by the [place] FDA and has been authorized for detection and/or diagnosis of SARS-CoV-2 by FDA under an Emergency Use Authorization (EUA).  This EUA will remain in effect (meaning this test can be used) for the duration of the COVID-19 declaration under Section 564(b)(1) of the Act, 21 U.S.C. section 360bbb-3(b)(1), unless the authorization is terminated or revoked sooner.  Performed at [facility], [address] [place], [place] [address]   MRSA PCR Screening     Status: None   Collection Time: 11/19/19 12:00 AM   Specimen: Nasopharyngeal  Result Value Ref Range Status   MRSA by PCR NEGATIVE NEGATIVE Final    Comment:        The GeneXpert MRSA Assay (FDA approved for NASAL specimens only), is one component of a comprehensive MRSA colonization surveillance program. It is not intended to diagnose MRSA infection nor to guide or monitor treatment for MRSA infections. Performed at [facility], [address] [place], [place] [address]     Coagulation Studies: No results for input(s): LABPROT, INR in the last 72 hours.  Urinalysis: No results for input(s): COLORURINE, LABSPEC, PHURINE, GLUCOSEU, HGBUR, BILIRUBINUR, KETONESUR, PROTEINUR, UROBILINOGEN, NITRITE, LEUKOCYTESUR in the last 72 hours.  Invalid input(s): APPERANCEUR    Imaging: MR BRAIN WO CONTRAST  Result Date: 11/21/2019 CLINICAL DATA:  Encephalopathy EXAM: MRI HEAD WITHOUT CONTRAST TECHNIQUE: Multiplanar, multiecho pulse sequences of the brain and surrounding structures were obtained without intravenous contrast. COMPARISON:  None.  FINDINGS: Motion artifact is present. Brain: Small foci reduced diffusion are present in the left centrum semiovale, right thalamus, and right cerebellum on axial DWI. Not all are confirmed on coronal DWI. There are chronic small vessel infarcts the basal ganglia and thalamus bilaterally. Additional few small chronic infarcts of cerebellum. Otherwise patchy and confluent areas of T2 hyperintensity in the supratentorial and pontine white matter are nonspecific but probably reflect moderate chronic microvascular ischemic changes. There is susceptibility in region of the lateral right precentral gyrus compatible with chronic blood products or less likely mineralization. Ventricles and sulci are within limits in size and configuration. There is no intracranial mass or significant mass effect. There is no hydrocephalus or extra-axial fluid collection. Vascular: Major vessel flow voids at the skull base are preserved. Skull and upper cervical spine: Normal marrow signal is preserved. Sinuses/Orbits: Posterior ethmoid mucosal thickening. Orbits are unremarkable. Other: Sella is unremarkable.  Mastoid air cells are clear. IMPRESSION: Motion degraded. Possible small acute to subacute infarcts of the left centrum semiovale, right thalamus, and right cerebellum. Moderate chronic microvascular ischemic changes. Small chronic infarcts as described. Electronically Signed   By: Macy Mis M.D.   On: 11/21/2019 15:05   DG Chest Port 1 View  Result Date: 11/20/2019 CLINICAL DATA:  Acute on chronic respiratory failure. EXAM: PORTABLE CHEST 1 VIEW COMPARISON:  11/18/2019 FINDINGS: The right IJ dialysis catheter is stable. The cardiac silhouette, mediastinal and hilar contours are unchanged. Stable tortuosity and calcification of the thoracic aorta. Persistent diffuse interstitial and airspace process in the lungs and persistent pleural effusions and bibasilar atelectasis. IMPRESSION: 1. Stable support apparatus. 2. Persistent  diffuse interstitial and airspace process and persistent pleural effusions and bibasilar atelectasis. Electronically Signed   By: Marijo Sanes M.D.   On: 11/20/2019 05:58     Medications:    . ALPRAZolam  0.25 mg Oral Once  . aspirin EC  81 mg Oral Daily  . carvedilol  12.5 mg Oral BID WC  . chlorhexidine  15 mL Mouth Rinse BID  . Chlorhexidine Gluconate Cloth  6 each Topical Q0600  . famotidine  20 mg Oral Daily  . feeding supplement (NEPRO CARB STEADY)  237 mL Oral BID AC & HS  . furosemide  60 mg Intravenous BID  . heparin  5,000 Units Subcutaneous Q8H  . irbesartan  150 mg Oral QPM  . mouth rinse  15 mL Mouth Rinse q12n4p  . mirtazapine  15 mg Oral QHS  . multivitamin  1 tablet Oral QHS  . tiotropium  18 mcg Inhalation Daily     Assessment/ Plan:  Scott Oconnor is a 74 y.o. [place] male with acute renal failure from cholesterol emboli requiring hemodialysis, hypertension, peripheral vascular disease, hyperlipidemia, who is admitted to [facility] on 11/18/2019 for Acute pulmonary edema ([place]) [J81.0] End stage renal disease on dialysis ([place]) [N18.6, Z99.2] Acute respiratory failure with hypoxia ([facility]) [J96.01]   [facility] T[facility] RIJ permcath 52.5kg  1. Acute renal failure requiring hemodialysis: emergent hemodialysis treatment on admission Seen and examined on hemodialysis treatment.  Monitor daily for dialysis need.   2. Hypertension: hypertensive emergency on admission. Home regimen of amlodipine, hydralazine, labetalol, and torsemide.  - IV furosemide.   3. Anemia with renal failure: hemoglobin 10.8. Macrocytic. Hold EPO due to ishemic changes.   4. Secondary Hyperparathyroidism: with hyperphosphatemia. Not currently on binders.    LOS: 3 Amalio Loe 7/1/20214:18 PM

## 2019-11-22 ENCOUNTER — Inpatient Hospital Stay (HOSPITAL_COMMUNITY): Payer: Medicare Other | Attending: Internal Medicine

## 2019-11-22 ENCOUNTER — Inpatient Hospital Stay (HOSPITAL_COMMUNITY): Payer: Medicare Other

## 2019-11-22 ENCOUNTER — Inpatient Hospital Stay: Payer: Medicare Other

## 2019-11-22 DIAGNOSIS — J9620 Acute and chronic respiratory failure, unspecified whether with hypoxia or hypercapnia: Secondary | ICD-10-CM

## 2019-11-22 DIAGNOSIS — I63413 Cerebral infarction due to embolism of bilateral middle cerebral arteries: Secondary | ICD-10-CM

## 2019-11-22 DIAGNOSIS — I5020 Unspecified systolic (congestive) heart failure: Secondary | ICD-10-CM

## 2019-11-22 DIAGNOSIS — I639 Cerebral infarction, unspecified: Secondary | ICD-10-CM

## 2019-11-22 LAB — BASIC METABOLIC PANEL
Anion gap: 16 — ABNORMAL HIGH (ref 5–15)
BUN: 42 mg/dL — ABNORMAL HIGH (ref 8–23)
CO2: 26 mmol/L (ref 22–32)
Calcium: 9 mg/dL (ref 8.9–10.3)
Chloride: 99 mmol/L (ref 98–111)
Creatinine, Ser: 5.07 mg/dL — ABNORMAL HIGH (ref 0.61–1.24)
GFR calc Af Amer: 12 mL/min — ABNORMAL LOW (ref >60)
GFR calc non Af Amer: 10 mL/min — ABNORMAL LOW (ref >60)
Glucose, Bld: 122 mg/dL — ABNORMAL HIGH (ref 70–99)
Potassium: 3.7 mmol/L (ref 3.5–5.1)
Sodium: 141 mmol/L (ref 135–145)

## 2019-11-22 LAB — NM MYOCAR MULTI W/SPECT W/WALL MOTION / EF
Estimated workload: 1 METS
Exercise duration (min): 0 min
Exercise duration (sec): 0 s
LV dias vol: 94 mL (ref 62–150)
LV sys vol: 59 mL
MPHR: 147 {beats}/min
Peak HR: 89 {beats}/min
Percent HR: 60 %
Rest HR: 65 {beats}/min
SDS: 0
SRS: 3
SSS: 4
TID: 0.86

## 2019-11-22 LAB — CBC WITH DIFFERENTIAL/PLATELET
Abs Immature Granulocytes: 0.01 10*3/uL (ref 0.00–0.07)
Basophils Absolute: 0.1 10*3/uL (ref 0.0–0.1)
Basophils Relative: 1 %
Eosinophils Absolute: 0.3 10*3/uL (ref 0.0–0.5)
Eosinophils Relative: 4 %
HCT: 32 % — ABNORMAL LOW (ref 39.0–52.0)
Hemoglobin: 10.5 g/dL — ABNORMAL LOW (ref 13.0–17.0)
Immature Granulocytes: 0 %
Lymphocytes Relative: 30 %
Lymphs Abs: 2.8 10*3/uL (ref 0.7–4.0)
MCH: 32.6 pg (ref 26.0–34.0)
MCHC: 32.8 g/dL (ref 30.0–36.0)
MCV: 99.4 fL (ref 80.0–100.0)
Monocytes Absolute: 1.3 10*3/uL — ABNORMAL HIGH (ref 0.1–1.0)
Monocytes Relative: 13 %
Neutro Abs: 4.9 10*3/uL (ref 1.7–7.7)
Neutrophils Relative %: 52 %
Platelets: 164 10*3/uL (ref 150–400)
RBC: 3.22 MIL/uL — ABNORMAL LOW (ref 4.22–5.81)
RDW: 16.1 % — ABNORMAL HIGH (ref 11.5–15.5)
WBC: 9.5 10*3/uL (ref 4.0–10.5)
nRBC: 0 % (ref 0.0–0.2)

## 2019-11-22 LAB — PROTIME-INR
INR: 1.1 (ref 0.8–1.2)
Prothrombin Time: 13.8 seconds (ref 11.4–15.2)

## 2019-11-22 LAB — APTT: aPTT: 36 seconds (ref 24–36)

## 2019-11-22 LAB — MAGNESIUM: Magnesium: 2.1 mg/dL (ref 1.7–2.4)

## 2019-11-22 LAB — PHOSPHORUS: Phosphorus: 8.6 mg/dL — ABNORMAL HIGH (ref 2.5–4.6)

## 2019-11-22 MED ORDER — CARVEDILOL 6.25 MG PO TABS
6.2500 mg | ORAL_TABLET | Freq: Two times a day (BID) | ORAL | Status: DC
  Administered 2019-11-22 (×2): 6.25 mg via ORAL
  Filled 2019-11-22 (×2): qty 1

## 2019-11-22 MED ORDER — REGADENOSON 0.4 MG/5ML IV SOLN
0.4000 mg | Freq: Once | INTRAVENOUS | Status: AC
  Administered 2019-11-22: 0.4 mg via INTRAVENOUS

## 2019-11-22 MED ORDER — DM-GUAIFENESIN ER 30-600 MG PO TB12
1.0000 | ORAL_TABLET | Freq: Two times a day (BID) | ORAL | Status: DC
  Administered 2019-11-22 – 2019-11-23 (×2): 1 via ORAL
  Filled 2019-11-22 (×2): qty 1

## 2019-11-22 MED ORDER — HEPARIN (PORCINE) 25000 UT/250ML-% IV SOLN
650.0000 [IU]/h | INTRAVENOUS | Status: DC
  Filled 2019-11-22: qty 250

## 2019-11-22 MED ORDER — HEPARIN SODIUM (PORCINE) 5000 UNIT/ML IJ SOLN
5000.0000 [IU] | Freq: Three times a day (TID) | INTRAMUSCULAR | Status: DC
  Administered 2019-11-22: 5000 [IU] via SUBCUTANEOUS
  Filled 2019-11-22: qty 1

## 2019-11-22 MED ORDER — ATORVASTATIN CALCIUM 20 MG PO TABS
40.0000 mg | ORAL_TABLET | Freq: Every day | ORAL | Status: DC
  Administered 2019-11-22 – 2019-11-23 (×2): 40 mg via ORAL
  Filled 2019-11-22 (×2): qty 2

## 2019-11-22 MED ORDER — HYDRALAZINE HCL 25 MG PO TABS
25.0000 mg | ORAL_TABLET | Freq: Three times a day (TID) | ORAL | Status: DC
  Administered 2019-11-22 (×2): 25 mg via ORAL
  Filled 2019-11-22 (×2): qty 1

## 2019-11-22 MED ORDER — HEPARIN BOLUS VIA INFUSION
2700.0000 [IU] | Freq: Once | INTRAVENOUS | Status: DC
  Filled 2019-11-22: qty 2700

## 2019-11-22 MED ORDER — TECHNETIUM TC 99M TETROFOSMIN IV KIT
32.8800 | PACK | Freq: Once | INTRAVENOUS | Status: AC | PRN
  Administered 2019-11-22: 32.88 via INTRAVENOUS

## 2019-11-22 MED ORDER — CLOPIDOGREL BISULFATE 75 MG PO TABS
75.0000 mg | ORAL_TABLET | Freq: Every day | ORAL | Status: DC
  Administered 2019-11-22: 75 mg via ORAL
  Filled 2019-11-22: qty 1

## 2019-11-22 MED ORDER — TECHNETIUM TC 99M TETROFOSMIN IV KIT
10.0900 | PACK | Freq: Once | INTRAVENOUS | Status: AC | PRN
  Administered 2019-11-22: 10.09 via INTRAVENOUS

## 2019-11-22 NOTE — Progress Notes (Addendum)
PROGRESS NOTE  Scott Oconnor ZOX:[id number] DOB: 1945/10/13 DOA: 11/18/2019 PCP: [name], MD   LOS: 4 days   Brief narrative: As per HPI on admission.   74 years old male with history of critical aortic atherosclerosis requiring endograft who has since suffered from kidney injury requiring dialysis in early May. The patient was seemingly well until the evening of presentation with any started noting acute shortness of breath which was progressive in nature.  Patient had missed dialysis for 1 day.  Patient was then brought into the hospital and was saturating 70% on room air and was initially hypertensive with systolic blood pressure in the 200s.   He was placed on CPAP with some improvement but his blood pressure remained very elevated in excess of 200. On arrival to the ED, he was indeed working to breath and so was switched to BiPAP from CPAP. His blood pressure was 230, and nitroglycerin drip was started.  CXR revealed bilateral infiltrates and effusions. His dry weight was 53.5kg by the record, and he apparently was 58.9kg on admittance to the ICU.  Patient was then subsequently transferred out of ICU.   Assessment/Plan:   Active Problems:   Acute pulmonary edema (HCC)   HFrEF (heart failure with reduced ejection fraction) (HCC)   Agitation, confusion likely hospital induced delirium, sleep deprivation. improved at this time.  Off Ativan.  We will try to avoid sedative-hypnotics.  Patient's daughter is concerned about sedation and would like to not have her father receive it.  Cerebral infarcts.  MRI of the brain showed areas of possible small acute to subacute infarcts of the left centrum semiovale, right thalamus and right cerebellum.  Teleneurology was called in yesterday.  Recommended aspirin and statins and outpatient monitor for atrial fibrillation.  Carotid duplex ultrasound was recommended which showed incidental finding of DVT in the right internal jugular vein with  less than 49% stenosis of the internal carotid arteries.  Acute hypoxic and hypercapnic respiratory failure secondary to hypertensive emergency and pulmonary edema.  Currently on 4 litres/min s by nasal cannula.  Patient does have a pleural effusion on the chest x-ray.  Fluid management per hemodialysis and IV Lasix   Acute systolic congestive heart failure.  Patient received BiPAP at admission.  Continue hemodialysis.  2D echocardiogram from 11/11/2019 with LV ejection fraction of 30 to 35% with grade 1 diastolic dysfunction.  Continue IV Lasix.  Continue hemodialysis.  Patient initially required nitroglycerin/ nicardipine drip in the ICU.  Currently on Coreg.  Continue ARB and hydralazine from home.  Underwent myocardial perfusion scan today with normal findings.  Internal jugular thrombus detected during carotid duplex ultrasound.  Incidental finding.  Seen by cardiology.  Heparin drip has been initiated.  End-stage renal disease on hemodialysis.   Continue hemodialysis as per nephrology.  Underwent dialysis 2 consecutive days for volume management.    mild leukocytosis.  Likely reactive.  WBC has normalized at this time.  Essential hypertension with hypertensive emergency on presentation.  Continue ARB, hydralazine Coreg and IV Lasix at this time.  Anemia secondary to chronic kidney disease.  Closely monitor.  No evidence of bleeding.    Debility, deconditioning.  PT, OT evaluation pending.  Severe protein calorie malnutrition. Present on admission. Dietitian on board. Continue nutritional supplements.  DVT prophylaxis: heparin bolus via infusion 2,700 Units Start: 11/22/19 1245    Code Status: Full code  Family Communication: Spoke with the patient's daughter at bedside   Status is: Inpatient  Remains inpatient appropriate  because:IV treatments appropriate due to intensity of illness or inability to take PO and Inpatient level of care appropriate due to severity of illness, new  internal jugular thrombus,   Dispo: The patient is from: Home              Anticipated d/c is to: Home home health/undetermined              Anticipated d/c date is: 2 to 3 days.              Patient currently is not medically stable to d/c.   Consultants: Nephrology,  PCCM Cardiology  Procedures: Hemodialysis BiPAP  Antibiotics:  None  Anti-infectives (From admission, onward)    None      Subjective: Today, patient was seen and examined at bedside.  Alert awake than yesterday responding to few questions.  Patient's daughter at bedside.  No mention of nausea vomiting fever chills or increasing shortness of breath.  Objective: Vitals:   11/22/19 0805 11/22/19 1017  BP: (!) 191/89 (!) 154/80  Pulse: 62 (!) 118  Resp:  18  Temp:  98.2 F (36.8 C)  SpO2:  100%    Intake/Output Summary (Last 24 hours) at 11/22/2019 1352 Last data filed at 11/22/2019 0851 Gross per 24 hour  Intake 290 ml  Output --  Net 290 ml   Filed Weights   11/21/19 0500 11/21/19 1338 11/22/19 0237  Weight: 52.2 kg 49.4 kg 42.2 kg   Body mass index is 15.01 kg/m.   Physical Exam: GENERAL: Patient is awake and communicative on arrival, on nasal cannula oxygen, thinly built, agitated at times.  Elderly and frail-appearing male HENT: No scleral pallor or icterus. Pupils equally reactive to light. Oral mucosa is moist NECK: is supple, no gross swelling noted. CHEST: Diminished breath sounds bilaterally especially over the bases.  Right internal jugular permacath in place  CVS: S1 and S2 heard, systolic murmur noted, regular rate and rhythm.  ABDOMEN: Soft, non-tender, bowel sounds are present. EXTREMITIES: Trace peripheral edema noted. CNS: Moves extremities, communicative  SKIN: warm and dry without rashes.  Data Review: I have personally reviewed the following laboratory data and studies,  CBC: Recent Labs  Lab 11/18/19 2118 11/20/19 0424 11/21/19 0730 11/22/19 0603  WBC 15.5* 9.0  9.0 9.5  NEUTROABS 6.2 4.8 4.8 4.9  HGB 11.0* 9.7* 10.8* 10.5*  HCT 34.2* 28.6* 32.0* 32.0*  MCV 102.4* 96.3 95.8 99.4  PLT 144* 132* 163 419   Basic Metabolic Panel: Recent Labs  Lab 11/18/19 2118 11/19/19 0129 11/19/19 0407 11/20/19 0424 11/21/19 0730 11/22/19 0603  NA 135  --  140 139 141 141  K 4.5  --  4.3 4.0 4.6 3.7  CL 99  --  100 100 100 99  CO2 22  --  26 26 26 26   GLUCOSE 254*  --  155* 123* 111* 122*  BUN 53*  --  37* 54* 39* 42*  CREATININE 7.13*  --  4.97* 6.97* 5.54* 5.07*  CALCIUM 8.3*  --  9.1 8.6* 9.4 9.0  MG  --  1.9  --  2.0 1.9 2.1  PHOS  --  4.2  --   --   --  8.6*   Liver Function Tests: Recent Labs  Lab 11/18/19 2118 11/20/19 0424  AST 43* 38  ALT 59* 69*  ALKPHOS 97 92  BILITOT 0.7 0.7  PROT 7.5 6.5  ALBUMIN 3.5 3.2*   No results for input(s): LIPASE, AMYLASE in the  last 168 hours. No results for input(s): AMMONIA in the last 168 hours. Cardiac Enzymes: No results for input(s): CKTOTAL, CKMB, CKMBINDEX, TROPONINI in the last 168 hours. BNP (last 3 results) Recent Labs    09/29/19 0642 11/18/19 2118  BNP 1,195.0* >4,500.0*    ProBNP (last 3 results) No results for input(s): PROBNP in the last 8760 hours.  CBG: Recent Labs  Lab 11/18/19 2353 11/20/19 1442  GLUCAP 153* 90   Recent Results (from the past 240 hour(s))  SARS Coronavirus 2 by RT PCR (hospital order, performed in [facility] hospital lab) Nasopharyngeal Nasopharyngeal Swab     Status: None   Collection Time: 11/18/19 10:59 PM   Specimen: Nasopharyngeal Swab  Result Value Ref Range Status   SARS Coronavirus 2 NEGATIVE NEGATIVE Final    Comment: (NOTE) SARS-CoV-2 target nucleic acids are NOT DETECTED.  The SARS-CoV-2 RNA is generally detectable in upper and lower respiratory specimens during the acute phase of infection. The lowest concentration of SARS-CoV-2 viral copies this assay can detect is 250 copies / mL. A negative result does not preclude SARS-CoV-2  infection and should not be used as the sole basis for treatment or other patient management decisions.  A negative result may occur with improper specimen collection / handling, submission of specimen other than nasopharyngeal swab, presence of viral mutation(s) within the areas targeted by this assay, and inadequate number of viral copies (<250 copies / mL). A negative result must be combined with clinical observations, patient history, and epidemiological information.  Fact Sheet for Patients:   StrictlyIdeas.no  Fact Sheet for Healthcare Providers: BankingDealers.co.za  This test is not yet approved or  cleared by the [place] FDA and has been authorized for detection and/or diagnosis of SARS-CoV-2 by FDA under an Emergency Use Authorization (EUA).  This EUA will remain in effect (meaning this test can be used) for the duration of the COVID-19 declaration under Section 564(b)(1) of the Act, 21 U.S.C. section 360bbb-3(b)(1), unless the authorization is terminated or revoked sooner.  Performed at [facility], [address] [place], [place] [address]   MRSA PCR Screening     Status: None   Collection Time: 11/19/19 12:00 AM   Specimen: Nasopharyngeal  Result Value Ref Range Status   MRSA by PCR NEGATIVE NEGATIVE Final    Comment:        The GeneXpert MRSA Assay (FDA approved for NASAL specimens only), is one component of a comprehensive MRSA colonization surveillance program. It is not intended to diagnose MRSA infection nor to guide or monitor treatment for MRSA infections. Performed at [facility], [address] [place], [place] [address]      Studies: MR BRAIN WO CONTRAST  Result Date: 11/21/2019 CLINICAL DATA:  Encephalopathy EXAM: MRI HEAD WITHOUT CONTRAST TECHNIQUE: Multiplanar, multiecho pulse sequences of the brain and surrounding structures were obtained without intravenous contrast.  COMPARISON:  None. FINDINGS: Motion artifact is present. Brain: Small foci reduced diffusion are present in the left centrum semiovale, right thalamus, and right cerebellum on axial DWI. Not all are confirmed on coronal DWI. There are chronic small vessel infarcts the basal ganglia and thalamus bilaterally. Additional few small chronic infarcts of cerebellum. Otherwise patchy and confluent areas of T2 hyperintensity in the supratentorial and pontine white matter are nonspecific but probably reflect moderate chronic microvascular ischemic changes. There is susceptibility in region of the lateral right precentral gyrus compatible with chronic blood products or less likely mineralization. Ventricles and sulci are within  limits in size and configuration. There is no intracranial mass or significant mass effect. There is no hydrocephalus or extra-axial fluid collection. Vascular: Major vessel flow voids at the skull base are preserved. Skull and upper cervical spine: Normal marrow signal is preserved. Sinuses/Orbits: Posterior ethmoid mucosal thickening. Orbits are unremarkable. Other: Sella is unremarkable.  Mastoid air cells are clear. IMPRESSION: Motion degraded. Possible small acute to subacute infarcts of the left centrum semiovale, right thalamus, and right cerebellum. Moderate chronic microvascular ischemic changes. Small chronic infarcts as described. Electronically Signed   By: Macy Mis M.D.   On: 11/21/2019 15:05   US Carotid Bilateral  Result Date: 11/22/2019 CLINICAL DATA:  74 year old male with stroke-like symptoms. EXAM: BILATERAL CAROTID DUPLEX ULTRASOUND TECHNIQUE: Pearline Cables scale imaging, color Doppler and duplex ultrasound were performed of bilateral carotid and vertebral arteries in the neck. COMPARISON:  None. FINDINGS: Criteria: Quantification of carotid stenosis is based on velocity parameters that correlate the residual internal carotid diameter with NASCET-based stenosis levels, using the  diameter of the distal internal carotid lumen as the denominator for stenosis measurement. The following velocity measurements were obtained: RIGHT ICA: 51/21 cm/sec CCA: 81/10 cm/sec SYSTOLIC ICA/CCA RATIO:  1.1 ECA:  44 cm/sec LEFT ICA: 54/19 cm/sec CCA: 31/59 cm/sec SYSTOLIC ICA/CCA RATIO:  0.9 ECA:  61 cm/sec RIGHT CAROTID ARTERY: Mild smooth heterogeneous atherosclerotic plaque in the proximal and mid internal carotid artery. By peak systolic velocity criteria, the estimated stenosis is less than 50%. RIGHT VERTEBRAL ARTERY:  Patent with normal antegrade flow. LEFT CAROTID ARTERY: Focal partially calcified atherosclerotic plaque in the distal common carotid artery. Minimal smooth heterogeneous plaque extends into the proximal internal carotid artery. By peak systolic velocity criteria, the estimated stenosis remains less than 50%. LEFT VERTEBRAL ARTERY:  Patent with normal antegrade flow. Other: Imaging of the internal jugular vein demonstrates and expanded, incompletely compressible vein with wall adherent internal echogenic material consistent with subocclusive thrombus. The size of the thrombus is fairly large. IMPRESSION: 1. Positive for incidental finding of large volume nearly occlusive deep venous thrombosis in the right internal jugular vein. 2. Mild (1-49%) stenosis proximal right internal carotid artery secondary to mild smooth heterogeneous atherosclerotic plaque. 3. Mild (1-49%) stenosis proximal left internal carotid artery secondary to trace smooth heterogeneous atherosclerotic plaque. 4. Vertebral arteries are patent with normal antegrade flow. These results will be called to the ordering clinician or representative by the Radiologist Assistant, and communication documented in the PACS or [employer]. Electronically Signed   By: Jacqulynn Cadet M.D.   On: 11/22/2019 09:53      Flora Lipps, MD  [facility] 11/22/2019

## 2019-11-22 NOTE — Progress Notes (Signed)
Progress Note  Patient Name: Scott Oconnor Date of Encounter: 11/22/2019  Primary Cardiologist: Ida Rogue, MD  Subjective   Patient resting quietly this morning.  Daughter at bedside.  Daughter reports patient slept reasonably well throughout the night and was conversant and oriented yesterday evening.  Patient is groggy this morning but follows commands and answers questions appropriately.  He is scheduled for stress testing.  Inpatient Medications    Scheduled Meds: . aspirin EC  81 mg Oral Daily  . carvedilol  6.25 mg Oral BID WC  . chlorhexidine  15 mL Mouth Rinse BID  . Chlorhexidine Gluconate Cloth  6 each Topical Q0600  . famotidine  20 mg Oral Daily  . feeding supplement (NEPRO CARB STEADY)  237 mL Oral BID AC & HS  . furosemide  60 mg Intravenous BID  . heparin  2,700 Units Intravenous Once  . irbesartan  150 mg Oral QPM  . mouth rinse  15 mL Mouth Rinse q12n4p  . multivitamin  1 tablet Oral QHS  . tiotropium  18 mcg Inhalation Daily   Continuous Infusions: . heparin     PRN Meds: acetaminophen, albuterol, docusate sodium, hydrALAZINE, ipratropium-albuterol, labetalol, polyethylene glycol   Vital Signs    Vitals:   11/22/19 0531 11/22/19 0738 11/22/19 0805 11/22/19 1017  BP: 131/71 (!) 187/79 (!) 191/89 (!) 154/80  Pulse: (!) 105 64 62 (!) 118  Resp: 20 16  18   Temp: 99 F (37.2 C) 98.4 F (36.9 C)  98.2 F (36.8 C)  TempSrc: Oral   Oral  SpO2: 100% 100%  100%  Weight:      Height:        Intake/Output Summary (Last 24 hours) at 11/22/2019 1358 Last data filed at 11/22/2019 0851 Gross per 24 hour  Intake 290 ml  Output --  Net 290 ml   Filed Weights   11/21/19 0500 11/21/19 1338 11/22/19 0237  Weight: 52.2 kg 49.4 kg 42.2 kg    Physical Exam   GEN: Thin, frail, in no acute distress.  HEENT: Grossly normal.  Neck: Supple, no JVD, carotid bruits, or masses. Cardiac: RRR, 2/6 systolic murmur at the upper sternal borders, no rubs, or  gallops. No clubbing, cyanosis, edema.  Radials/DP/PT 2+ and equal bilaterally.  Respiratory:  Respirations regular and unlabored, diminished breath sounds bilaterally.  Poor effort. GI: Soft, nontender, nondistended, BS + x 4. MS: no deformity or atrophy. Skin: warm and dry, no rash. Neuro:  Strength and sensation are intact. Psych: AAOx3.  Normal affect.  Labs    Chemistry Recent Labs  Lab 11/18/19 2118 11/19/19 0407 11/20/19 0424 11/21/19 0730 11/22/19 0603  NA 135   < > 139 141 141  K 4.5   < > 4.0 4.6 3.7  CL 99   < > 100 100 99  CO2 22   < > 26 26 26   GLUCOSE 254*   < > 123* 111* 122*  BUN 53*   < > 54* 39* 42*  CREATININE 7.13*   < > 6.97* 5.54* 5.07*  CALCIUM 8.3*   < > 8.6* 9.4 9.0  PROT 7.5  --  6.5  --   --   ALBUMIN 3.5  --  3.2*  --   --   AST 43*  --  38  --   --   ALT 59*  --  69*  --   --   ALKPHOS 97  --  92  --   --  BILITOT 0.7  --  0.7  --   --   GFRNONAA 7*   < > 7* 9* 10*  GFRAA 8*   < > 8* 11* 12*  ANIONGAP 14   < > 13 15 16*   < > = values in this interval not displayed.     Hematology Recent Labs  Lab 11/20/19 0424 11/21/19 0730 11/22/19 0603  WBC 9.0 9.0 9.5  RBC 2.97* 3.34* 3.22*  HGB 9.7* 10.8* 10.5*  HCT 28.6* 32.0* 32.0*  MCV 96.3 95.8 99.4  MCH 32.7 32.3 32.6  MCHC 33.9 33.8 32.8  RDW 16.8* 16.4* 16.1*  PLT 132* 163 164    Cardiac Enzymes  Recent Labs  Lab 11/18/19 2118 11/18/19 2302 11/19/19 0129 11/19/19 0407  TROPONINIHS 79* 192* 390* 348*      BNP Recent Labs  Lab 11/18/19 2118  BNP >4,500.0*     Radiology    CT HEAD WO CONTRAST  Result Date: 11/19/2019 CLINICAL DATA:  Ataxia, altered level of consciousness. EXAM: CT HEAD WITHOUT CONTRAST TECHNIQUE: Contiguous axial images were obtained from the base of the skull through the vertex without intravenous contrast. COMPARISON:  None. FINDINGS: Brain: Mild chronic ischemic white matter disease is noted. No mass effect or midline shift is noted. Ventricular size  is within normal limits. There is no evidence of mass lesion, hemorrhage or acute infarction. Vascular: No hyperdense vessel or unexpected calcification. Skull: Normal. Negative for fracture or focal lesion. Sinuses/Orbits: No acute finding. Other: None. Electronically Signed   By: Marijo Conception M.D.   On: 11/19/2019 08:31   MR BRAIN WO CONTRAST  Result Date: 11/21/2019 CLINICAL DATA:  Encephalopathy EXAM: MRI HEAD WITHOUT CONTRAST TECHNIQUE: Multiplanar, multiecho pulse sequences of the brain and surrounding structures were obtained without intravenous contrast. COMPARISON:  None. FINDINGS: Motion artifact is present. Brain: Small foci reduced diffusion are present in the left centrum semiovale, right thalamus, and right cerebellum on axial DWI. Not all are confirmed on coronal DWI. There are chronic small vessel infarcts the basal ganglia and thalamus bilaterally. Additional few small chronic infarcts of cerebellum. Otherwise patchy and confluent areas of T2 hyperintensity in the supratentorial and pontine white matter are nonspecific but probably reflect moderate chronic microvascular ischemic changes. There is susceptibility in region of the lateral right precentral gyrus compatible with chronic blood products or less likely mineralization. Ventricles and sulci are within limits in size and configuration. There is no intracranial mass or significant mass effect. There is no hydrocephalus or extra-axial fluid collection. Vascular: Major vessel flow voids at the skull base are preserved. Skull and upper cervical spine: Normal marrow signal is preserved. Sinuses/Orbits: Posterior ethmoid mucosal thickening. Orbits are unremarkable. Other: Sella is unremarkable.  Mastoid air cells are clear. IMPRESSION: Motion degraded. Possible small acute to subacute infarcts of the left centrum semiovale, right thalamus, and right cerebellum. Moderate chronic microvascular ischemic changes. Small chronic infarcts as  described. Electronically Signed   By: Macy Mis M.D.   On: 11/21/2019 15:05   DG Chest Port 1 View  Result Date: 11/20/2019 CLINICAL DATA:  Acute on chronic respiratory failure. EXAM: PORTABLE CHEST 1 VIEW COMPARISON:  11/18/2019 FINDINGS: The right IJ dialysis catheter is stable. The cardiac silhouette, mediastinal and hilar contours are unchanged. Stable tortuosity and calcification of the thoracic aorta. Persistent diffuse interstitial and airspace process in the lungs and persistent pleural effusions and bibasilar atelectasis. IMPRESSION: 1. Stable support apparatus. 2. Persistent diffuse interstitial and airspace process and persistent  pleural effusions and bibasilar atelectasis. Electronically Signed   By: Marijo Sanes M.D.   On: 11/20/2019 05:58   DG Chest Port 1 View  Result Date: 11/18/2019 CLINICAL DATA:  Dyspnea EXAM: PORTABLE CHEST 1 VIEW COMPARISON:  09/29/2019 FINDINGS: Right-sided central venous catheter with tips over the SVC and cavoatrial region. Moderate bilateral pleural effusions. Dense basilar consolidations. Obscured cardiomediastinal silhouette. Vascular congestion with moderate to marked diffuse bilateral interstitial and ground-glass opacity, likely edema. Aortic atherosclerosis. No pneumothorax. IMPRESSION: 1. Moderate bilateral pleural effusions with bibasilar consolidations. 2. Vascular congestion with moderate to marked diffuse bilateral interstitial and ground-glass opacity, most suspicious for pulmonary edema. Electronically Signed   By: Donavan Foil M.D.   On: 11/18/2019 21:45    Telemetry    Sinus rhythm/sinus bradycardia, PACs - Personally Reviewed  Cardiac Studies   2D Echocardiogram 5.5.2021  Left ventricular ejection fraction, by estimation, is 60 to 65%. The  left ventricle has normal function. The left ventricle has no regional  wall motion abnormalities. There is mild left ventricular hypertrophy.  Left ventricular diastolic parameters  are  consistent with Grade II diastolic dysfunction (pseudonormalization).   2. Right ventricular systolic function is normal. The right ventricular  size is normal. There is mildly elevated pulmonary artery systolic  pressure. The estimated right ventricular systolic pressure is 49.6 mmHg.   3. Mild to moderate mitral valve regurgitation.   4. Tricuspid valve regurgitation is moderate.  _____________   Patient Profile     74 y.o. male with history of AAA status post endovascular repair in 11/5914 complicated by ARF requiring HD, Raynaud's disease, HTN, and HLDwho is being seen today for the evaluation of acute systolic CHFat the request of Dr. Mortimer Fries.  Assessment & Plan    1.  Acute heart failure with reduced EF/acute pulmonary edema/acute hypoxic and hypercapnic respiratory failure: Patient admitted 6/28 with worsening dyspnea, wt gain, hypoxia (sats in the 70's), and hypertensive urgency with a blood pressure of 230 mmHg upon EMS arrival.  Initially required BiPAP.  Though echo in May of this year showed normal LV function, repeat echo on June 29 now shows LV dysfunction with EF of 30 to 35% and global hypokinesis.  Grade 1 diastolic dysfunction also noted.  High-sensitivity troponin also moderately elevated with a peak of 390.  He denies chest pain.  He remains on intravenous Lasix and also has undergone hemodialysis with significant volume improvement.  He remains on beta-blocker and ARB therapy.  2.  Elevated high-sensitivity troponin/demand ischemia: No chest pain but progressive heart failure and new finding of LV dysfunction with an EF of 30 to 35% concerning.  Plan for Lexiscan Myoview this morning to assess for ischemia.  With renal failure and patient/family's hope to avoid long-term dialysis, they would also prefer to avoid diagnostic catheterization.  Continue aspirin, beta-blocker, and ARB.  LDL of 90 and I will add a statin.  3.  Hypertensive urgency/essential hypertension: He did not  receive any p.o. medications yesterday and has been getting as needed labetalol.  Resuming oral medications today including beta-blocker and as needed hydralazine.  He will receive irbesartan later this evening.  I suspect he may require scheduled hydralazine.  4.  End-stage renal disease: Required urgent dialysis following admission.  Now back on Tuesday, Thursday, Saturday.  Per nephrology.  5.  Altered mental status/CVA: Per daughter, patient much more lucid last night and he is awake and oriented this morning.  He does seem somewhat groggy but responds appropriately and follows  commands.  MRI of the brain yesterday showed possible small acute to subacute infarcts of the left centrum semiovale, right thalamus, and right cerebellum.  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  He has not had any atrial fibrillation on telemetry.  Of note, carotid ultrasound did not show any carotid disease though an incidental right IJ DVT was noted.  He remains on aspirin therapy for the time being but in the setting of DVT, will need oral anticoagulation.  6.  Right IJ DVT: Associated with hemodialysis catheter.  Nephrology is consulting vascular surgery.  In the interim, I will add heparin.  7.  Anemia of chronic disease: Stable.  Murray Hodgkins, NP  11/22/2019, 1:58 PM    For questions or updates, please contact   Please consult [email or web address] for contact info under Cardiology/STEMI.

## 2019-11-22 NOTE — Consult Note (Signed)
[facility] Vascular Consult Note  MRN : [id number]  Scott Oconnor is a 74 y.o. (Apr 25, 1946) male who presents with chief complaint of  Chief Complaint  Patient presents with  . Respiratory Distress   History of Present Illness:  The patient is a 74 year old male well-known to our service who is s/p endovascular AAA repair a few months ago.  Patient has also had a PermCath insertion by our service.  Admitted to the [facility] for respiratory distress.  During his inpatient stay, the patient underwent a carotid duplex on November 22, 2019.  Mild 1 to 49% stenosis noted bilaterally.  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  This happens to be the same side the patient's PermCath is on.  Patient is asymptomatic at this time.  Vascular surgery was consulted by Dr. [name] via epic chat for possible management recommendations.  Current Facility-Administered Medications  Medication Dose Route Frequency Provider Last Rate Last Admin  . acetaminophen (TYLENOL) tablet 1,000 mg  1,000 mg Oral Q6H PRN Nelle Don, MD   1,000 mg at 11/21/19 2136  . albuterol (PROVENTIL) (2.5 MG/3ML) 0.083% nebulizer solution 2.5 mg  2.5 mg Nebulization Q6H PRN Flora Lipps, MD      . aspirin EC tablet 81 mg  81 mg Oral Daily Christell Faith M, PA-C   81 mg at 11/22/19 0843  . atorvastatin (LIPITOR) tablet 40 mg  40 mg Oral Daily Theora Gianotti, NP      . carvedilol (COREG) tablet 6.25 mg  6.25 mg Oral BID WC Theora Gianotti, NP   6.25 mg at 11/22/19 0844  . chlorhexidine (PERIDEX) 0.12 % solution 15 mL  15 mL Mouth Rinse BID Nelle Don, MD   15 mL at 11/20/19 0900  . Chlorhexidine Gluconate Cloth 2 % PADS 6 each  6 each Topical Q0600 Lavonia Dana, MD   6 each at 11/22/19 0537  . docusate sodium (COLACE) capsule 100 mg  100 mg Oral BID PRN Nelle Don, MD      . famotidine  (PEPCID) tablet 20 mg  20 mg Oral Daily Flora Lipps, MD   20 mg at 11/22/19 0844  . feeding supplement (NEPRO CARB STEADY) liquid 237 mL  237 mL Oral BID AC & HS Kasa, Kurian, MD      . furosemide (LASIX) injection 60 mg  60 mg Intravenous BID Kolluru, Sarath, MD   60 mg at 11/20/19 1636  . heparin bolus via infusion 2,700 Units  2,700 Units Intravenous Once Shanlever, Pierce Crane, RPH       Followed by  . heparin ADULT infusion 100 units/mL (25000 units/283mL sodium chloride 0.45%)  650 Units/hr Intravenous Continuous Shanlever, Pierce Crane, RPH      . hydrALAZINE (APRESOLINE) tablet 25 mg  25 mg Oral TID [name], MD      . ipratropium-albuterol (DUONEB) 0.5-2.5 (3) MG/3ML nebulizer solution 3 mL  3 mL Nebulization Q6H PRN Awilda Bill, NP   3 mL at 11/20/19 0528  . irbesartan (AVAPRO) tablet 150 mg  150 mg Oral QPM Pokhrel, Laxman, MD      . labetalol (NORMODYNE) injection 20 mg  20 mg Intravenous Q2H PRN Nelle Don, MD   20 mg at 11/22/19 0453  . MEDLINE mouth rinse  15 mL Mouth Rinse q12n4p Nelle Don, MD   15 mL at 11/20/19 1449  . multivitamin (RENA-VIT) tablet 1 tablet  1 tablet Oral QHS Pokhrel, Laxman, MD   1 tablet at 11/21/19 2114  . polyethylene glycol (MIRALAX / GLYCOLAX) packet 17 g  17 g Oral Daily PRN Nelle Don, MD      . tiotropium [facility]) inhalation capsule ([facility] use ONLY) 18 mcg  18 mcg Inhalation Daily Flora Lipps, MD   18 mcg at 11/22/19 4166   Past Medical History:  Diagnosis Date  . Hyperlipidemia   . Hypertension   . PVD (peripheral vascular disease) ([place])   . Reynolds syndrome [facility])    Past Surgical History:  Procedure Laterality Date  . DIALYSIS/PERMA CATHETER INSERTION N/A 09/26/2019   Procedure: DIALYSIS/PERMA CATHETER INSERTION;  Surgeon: Algernon Huxley, MD;  Location: [place] [facility];  Service: Cardiovascular;  Laterality: N/A;  . ENDOVASCULAR REPAIR/STENT GRAFT N/A 08/14/2019   Procedure: ENDOVASCULAR REPAIR/STENT GRAFT;   Surgeon: Katha Cabal, MD;  Location: [place] [facility];  Service: Cardiovascular;  Laterality: N/A;   Social History Social History   Tobacco Use  . Smoking status: Current Every Day Smoker    Packs/day: 0.33    Types: Cigarettes  . Smokeless tobacco: Never Used  . Tobacco comment: 6 cigarettes daily   Vaping Use  . Vaping Use: Never used  Substance Use Topics  . Alcohol use: No  . Drug use: No   Family History Family History  Problem Relation Age of Onset  . Heart disease Father   . Sudden Cardiac Death Father   . Heart disease Brother   . Hyperlipidemia Brother   . Heart disease Sister   . Heart disease Brother   . Hyperlipidemia Brother   . Hyperlipidemia Brother   . Hyperlipidemia Brother   . Hyperlipidemia Brother   . Hyperlipidemia Brother   . Heart disease Sister   Denies family history of peripheral artery disease, venous disease or renal disease.  Allergies  Allergen Reactions  . Ivp Dye [Iodinated Diagnostic Agents]     ESRD patient    REVIEW OF SYSTEMS (Negative unless checked)  Constitutional: [] Weight loss  [] Fever  [] Chills Cardiac: [] Chest pain   [] Chest pressure   [] Palpitations   [] Shortness of breath when laying flat   [] Shortness of breath at rest   [x] Shortness of breath with exertion. Vascular:  [] Pain in legs with walking   [] Pain in legs at rest   [] Pain in legs when laying flat   [] Claudication   [] Pain in feet when walking  [] Pain in feet at rest  [] Pain in feet when laying flat   [] History of DVT   [] Phlebitis   [] Swelling in legs   [] Varicose veins   [] Non-healing ulcers Pulmonary:   [] Uses home oxygen   [] Productive cough   [] Hemoptysis   [] Wheeze  [] COPD   [] Asthma Neurologic:  [] Dizziness  [] Blackouts   [] Seizures   [] History of stroke   [] History of TIA  [] Aphasia   [] Temporary blindness   [] Dysphagia   [] Weakness or numbness in arms   [] Weakness or numbness in legs Musculoskeletal:  [] Arthritis   [] Joint swelling   [] Joint pain    [] Low back pain Hematologic:  [] Easy bruising  [] Easy bleeding   [] Hypercoagulable state   [] Anemic  [] Hepatitis Gastrointestinal:  [] Blood in stool   [] Vomiting blood  [] Gastroesophageal reflux/heartburn   [] Difficulty swallowing. Genitourinary:  [x] Chronic kidney disease   [] Difficult urination  [] Frequent urination  [] Burning with urination   [] Blood in urine Skin:  [] Rashes   [] Ulcers   [] Wounds Psychological:  [] History of  anxiety   []  History of major depression.  Physical Examination  Vitals:   11/22/19 0738 11/22/19 0805 11/22/19 1017 11/22/19 1416  BP: (!) 187/79 (!) 191/89 (!) 154/80 (!) 149/91  Pulse: 64 62 (!) 118 84  Resp: 16  18   Temp: 98.4 F (36.9 C)  98.2 F (36.8 C) (!) 97.5 F (36.4 C)  TempSrc:   Oral Oral  SpO2: 100%  100% 97%  Weight:      Height:       Body mass index is 15.01 kg/m. Gen:  WD/WN, NAD Head: [place]/AT, No temporalis wasting. Prominent temp pulse not noted. Ear/Nose/Throat: Hearing grossly intact, nares w/o erythema or drainage, oropharynx w/o Erythema/Exudate Eyes: Sclera non-icteric, conjunctiva clear Neck: Trachea midline.  No JVD.  Pulmonary:  Good air movement, respirations not labored, equal bilaterally.  Cardiac: RRR, normal S1, S2. Vascular:  Vessel Right Left  Radial Palpable Palpable  Ulnar Palpable Palpable  Brachial Palpable Palpable  Carotid Palpable, without bruit Palpable, without bruit  Aorta Not palpable N/A  Femoral Palpable Palpable  Popliteal Palpable Palpable  PT Palpable Palpable  DP Palpable Palpable   Right IJ PermCath:  Intact.  No signs of infection.  Gastrointestinal: soft, non-tender/non-distended. No guarding/reflex.  Musculoskeletal: M/S 5/5 throughout.  Extremities without ischemic changes.  No deformity or atrophy. No edema. Neurologic: Sensation grossly intact in extremities.  Symmetrical.  Speech is fluent. Motor exam as listed above. Psychiatric: Judgment intact, Mood & affect appropriate for pt's  clinical situation. Dermatologic: No rashes or ulcers noted.  No cellulitis or open wounds. Lymph : No Cervical, Axillary, or Inguinal lymphadenopathy.  CBC Lab Results  Component Value Date   WBC 9.5 11/22/2019   HGB 10.5 (L) 11/22/2019   HCT 32.0 (L) 11/22/2019   MCV 99.4 11/22/2019   PLT 164 11/22/2019   BMET    Component Value Date/Time   NA 141 11/22/2019 0603   K 3.7 11/22/2019 0603   CL 99 11/22/2019 0603   CO2 26 11/22/2019 0603   GLUCOSE 122 (H) 11/22/2019 0603   BUN 42 (H) 11/22/2019 0603   CREATININE 5.07 (H) 11/22/2019 0603   CREATININE 4.69 (H) 09/19/2019 1130   CALCIUM 9.0 11/22/2019 0603   GFRNONAA 10 (L) 11/22/2019 0603   GFRNONAA 11 (L) 09/19/2019 1130   GFRAA 12 (L) 11/22/2019 0603   GFRAA 13 (L) 09/19/2019 1130   Estimated Creatinine Clearance: 7.7 mL/min (A) (by C-G formula based on SCr of 5.07 mg/dL (H)).  COAG Lab Results  Component Value Date   INR 1.1 11/22/2019   INR 1.1 09/24/2019   INR 1.1 08/28/2019   Radiology CT HEAD WO CONTRAST  Result Date: 11/19/2019 CLINICAL DATA:  Ataxia, altered level of consciousness. EXAM: CT HEAD WITHOUT CONTRAST TECHNIQUE: Contiguous axial images were obtained from the base of the skull through the vertex without intravenous contrast. COMPARISON:  None. FINDINGS: Brain: Mild chronic ischemic white matter disease is noted. No mass effect or midline shift is noted. Ventricular size is within normal limits. There is no evidence of mass lesion, hemorrhage or acute infarction. Vascular: No hyperdense vessel or unexpected calcification. Skull: Normal. Negative for fracture or focal lesion. Sinuses/Orbits: No acute finding. Other: None. Electronically Signed   By: Marijo Conception M.D.   On: 11/19/2019 08:31   MR BRAIN WO CONTRAST  Result Date: 11/21/2019 CLINICAL DATA:  Encephalopathy EXAM: MRI HEAD WITHOUT CONTRAST TECHNIQUE: Multiplanar, multiecho pulse sequences of the brain and surrounding structures were obtained  without intravenous  contrast. COMPARISON:  None. FINDINGS: Motion artifact is present. Brain: Small foci reduced diffusion are present in the left centrum semiovale, right thalamus, and right cerebellum on axial DWI. Not all are confirmed on coronal DWI. There are chronic small vessel infarcts the basal ganglia and thalamus bilaterally. Additional few small chronic infarcts of cerebellum. Otherwise patchy and confluent areas of T2 hyperintensity in the supratentorial and pontine white matter are nonspecific but probably reflect moderate chronic microvascular ischemic changes. There is susceptibility in region of the lateral right precentral gyrus compatible with chronic blood products or less likely mineralization. Ventricles and sulci are within limits in size and configuration. There is no intracranial mass or significant mass effect. There is no hydrocephalus or extra-axial fluid collection. Vascular: Major vessel flow voids at the skull base are preserved. Skull and upper cervical spine: Normal marrow signal is preserved. Sinuses/Orbits: Posterior ethmoid mucosal thickening. Orbits are unremarkable. Other: Sella is unremarkable.  Mastoid air cells are clear. IMPRESSION: Motion degraded. Possible small acute to subacute infarcts of the left centrum semiovale, right thalamus, and right cerebellum. Moderate chronic microvascular ischemic changes. Small chronic infarcts as described. Electronically Signed   By: Macy Mis M.D.   On: 11/21/2019 15:05   US Carotid Bilateral  Result Date: 11/22/2019 CLINICAL DATA:  74 year old male with stroke-like symptoms. EXAM: BILATERAL CAROTID DUPLEX ULTRASOUND TECHNIQUE: Pearline Cables scale imaging, color Doppler and duplex ultrasound were performed of bilateral carotid and vertebral arteries in the neck. COMPARISON:  None. FINDINGS: Criteria: Quantification of carotid stenosis is based on velocity parameters that correlate the residual internal carotid diameter with NASCET-based  stenosis levels, using the diameter of the distal internal carotid lumen as the denominator for stenosis measurement. The following velocity measurements were obtained: RIGHT ICA: 51/21 cm/sec CCA: 40/98 cm/sec SYSTOLIC ICA/CCA RATIO:  1.1 ECA:  44 cm/sec LEFT ICA: 54/19 cm/sec CCA: 11/91 cm/sec SYSTOLIC ICA/CCA RATIO:  0.9 ECA:  61 cm/sec RIGHT CAROTID ARTERY: Mild smooth heterogeneous atherosclerotic plaque in the proximal and mid internal carotid artery. By peak systolic velocity criteria, the estimated stenosis is less than 50%. RIGHT VERTEBRAL ARTERY:  Patent with normal antegrade flow. LEFT CAROTID ARTERY: Focal partially calcified atherosclerotic plaque in the distal common carotid artery. Minimal smooth heterogeneous plaque extends into the proximal internal carotid artery. By peak systolic velocity criteria, the estimated stenosis remains less than 50%. LEFT VERTEBRAL ARTERY:  Patent with normal antegrade flow. Other: Imaging of the internal jugular vein demonstrates and expanded, incompletely compressible vein with wall adherent internal echogenic material consistent with subocclusive thrombus. The size of the thrombus is fairly large. IMPRESSION: 1. Positive for incidental finding of large volume nearly occlusive deep venous thrombosis in the right internal jugular vein. 2. Mild (1-49%) stenosis proximal right internal carotid artery secondary to mild smooth heterogeneous atherosclerotic plaque. 3. Mild (1-49%) stenosis proximal left internal carotid artery secondary to trace smooth heterogeneous atherosclerotic plaque. 4. Vertebral arteries are patent with normal antegrade flow. These results will be called to the ordering clinician or representative by the Radiologist Assistant, and communication documented in the PACS or [employer]. Electronically Signed   By: Jacqulynn Cadet M.D.   On: 11/22/2019 09:53   DG Chest Port 1 View  Result Date: 11/20/2019 CLINICAL DATA:  Acute on chronic  respiratory failure. EXAM: PORTABLE CHEST 1 VIEW COMPARISON:  11/18/2019 FINDINGS: The right IJ dialysis catheter is stable. The cardiac silhouette, mediastinal and hilar contours are unchanged. Stable tortuosity and calcification of the thoracic aorta. Persistent diffuse interstitial and  airspace process in the lungs and persistent pleural effusions and bibasilar atelectasis. IMPRESSION: 1. Stable support apparatus. 2. Persistent diffuse interstitial and airspace process and persistent pleural effusions and bibasilar atelectasis. Electronically Signed   By: Marijo Sanes M.D.   On: 11/20/2019 05:58   DG Chest Port 1 View  Result Date: 11/18/2019 CLINICAL DATA:  Dyspnea EXAM: PORTABLE CHEST 1 VIEW COMPARISON:  09/29/2019 FINDINGS: Right-sided central venous catheter with tips over the SVC and cavoatrial region. Moderate bilateral pleural effusions. Dense basilar consolidations. Obscured cardiomediastinal silhouette. Vascular congestion with moderate to marked diffuse bilateral interstitial and ground-glass opacity, likely edema. Aortic atherosclerosis. No pneumothorax. IMPRESSION: 1. Moderate bilateral pleural effusions with bibasilar consolidations. 2. Vascular congestion with moderate to marked diffuse bilateral interstitial and ground-glass opacity, most suspicious for pulmonary edema. Electronically Signed   By: Donavan Foil M.D.   On: 11/18/2019 21:45   ECHOCARDIOGRAM COMPLETE  Result Date: 11/19/2019    ECHOCARDIOGRAM REPORT   Patient Name:   Scott Oconnor Date of Exam: 11/19/2019 Medical Rec #:  [id number]         Height:       66.0 in Accession #:    [id number]        Weight:       129.9 lb Date of Birth:  01-25-1946         BSA:          1.664 m Patient Age:    46 years          BP:           146/70 mmHg Patient Gender: M                 HR:           59 bpm. Exam Location:  [facility] Procedure: 2D Echo, Color Doppler and Cardiac Doppler Indications:     CHF- acute diastolic 527.78  History:          Patient has prior history of Echocardiogram examinations, most                  recent 09/25/2019. Risk Factors:Hypertension and Dyslipidemia.                  PVD.  Sonographer:     [name] RDCS (AE) Referring Phys:  [id number] DONALD BRESCIA Diagnosing Phys: Nelva Bush MD IMPRESSIONS  1. Left ventricular ejection fraction, by estimation, is 30 to 35%. The left ventricle has moderately decreased function. The left ventricle demonstrates global hypokinesis. There is mild left ventricular hypertrophy. Left ventricular diastolic parameters are consistent with Grade I diastolic dysfunction (impaired relaxation). Elevated left atrial pressure.  2. Right ventricular systolic function is normal. The right ventricular size is normal. There is moderately elevated pulmonary artery systolic pressure.  3. Moderate pleural effusion in both left and right lateral regions.  4. The mitral valve is normal in structure. Mild mitral valve regurgitation.  5. The aortic valve is tricuspid. Aortic valve regurgitation is trivial. No aortic stenosis is present.  6. The inferior vena cava is normal in size with <50% respiratory variability, suggesting right atrial pressure of 8 mmHg. Comparison(s): Prior images reviewed side by side. The left ventricular function is worsened. FINDINGS  Left Ventricle: Left ventricular ejection fraction, by estimation, is 30 to 35%. The left ventricle has moderately decreased function. The left ventricle demonstrates global hypokinesis. The left ventricular internal cavity size was normal in size. There is mild left ventricular hypertrophy. Left ventricular diastolic  parameters are consistent with Grade I diastolic dysfunction (impaired relaxation). Elevated left atrial pressure. Right Ventricle: The right ventricular size is normal. No increase in right ventricular wall thickness. Right ventricular systolic function is normal. There is moderately elevated pulmonary artery systolic pressure. The  tricuspid regurgitant velocity is 3.52 m/s, and with an assumed right atrial pressure of 8 mmHg, the estimated right ventricular systolic pressure is 36.4 mmHg. Left Atrium: Left atrial size was normal in size. Right Atrium: Right atrial size was normal in size. Pericardium: Trivial pericardial effusion is present. Mitral Valve: The mitral valve is normal in structure. Mild mitral annular calcification. Mild mitral valve regurgitation. Tricuspid Valve: The tricuspid valve is grossly normal. Tricuspid valve regurgitation is mild. Aortic Valve: The aortic valve is tricuspid. Aortic valve regurgitation is trivial. No aortic stenosis is present. Aortic valve mean gradient measures 2.0 mmHg. Aortic valve peak gradient measures 3.8 mmHg. Aortic valve area, by VTI measures 1.68 cm. Pulmonic Valve: The pulmonic valve was not well visualized. Pulmonic valve regurgitation is mild. No evidence of pulmonic stenosis. Aorta: The aortic root is normal in size and structure. Pulmonary Artery: The pulmonary artery is not well seen. Venous: The inferior vena cava is normal in size with less than 50% respiratory variability, suggesting right atrial pressure of 8 mmHg. IAS/Shunts: No atrial level shunt detected by color flow Doppler. Additional Comments: There is a moderate pleural effusion in both left and right lateral regions.  LEFT VENTRICLE PLAX 2D LVIDd:         4.72 cm  Diastology LVIDs:         3.33 cm  LV e' lateral:   4.68 cm/s LV PW:         1.18 cm  LV E/e' lateral: 11.9 LV IVS:        0.92 cm  LV e' medial:    2.72 cm/s LVOT diam:     2.00 cm  LV E/e' medial:  20.5 LV SV:         34 LV SV Index:   20 LVOT Area:     3.14 cm  RIGHT VENTRICLE RV Basal diam:  2.66 cm RV S prime:     14.10 cm/s TAPSE (M-mode): 3.5 cm LEFT ATRIUM           Index       RIGHT ATRIUM           Index LA diam:      3.60 cm 2.16 cm/m  RA Area:     13.10 cm LA Vol (A2C): 48.2 ml 28.96 ml/m RA Volume:   34.40 ml  20.67 ml/m LA Vol (A4C): 26.9 ml  16.16 ml/m  AORTIC VALVE                   PULMONIC VALVE AV Area (Vmax):    1.67 cm    PV Vmax:       0.69 m/s AV Area (Vmean):   1.58 cm    PV Peak grad:  1.9 mmHg AV Area (VTI):     1.68 cm AV Vmax:           97.17 cm/s AV Vmean:          68.200 cm/s AV VTI:            0.202 m AV Peak Grad:      3.8 mmHg AV Mean Grad:      2.0 mmHg LVOT Vmax:  51.60 cm/s LVOT Vmean:        34.400 cm/s LVOT VTI:          0.108 m LVOT/AV VTI ratio: 0.53  AORTA Ao Root diam: 3.00 cm MITRAL VALVE               TRICUSPID VALVE MV Area (PHT): 3.65 cm    TR Peak grad:   49.6 mmHg MV Decel Time: 208 msec    TR Vmax:        352.00 cm/s MV E velocity: 55.70 cm/s MV A velocity: 95.10 cm/s  SHUNTS MV E/A ratio:  0.59        Systemic VTI:  0.11 m                            Systemic Diam: 2.00 cm Nelva Bush MD Electronically signed by Nelva Bush MD Signature Date/Time: 11/19/2019/2:00:40 PM    Final    Assessment/Plan The patient is a 74 year old male well-known to our service who is s/p endovascular AAA repair a few months ago.  Patient has also had a PermCath insertion by our service.  1.  Right internal jugular vein thrombosis: Incidental finding of large volume nearly occlusive deep vein thrombosis in the right internal jugular vein on carotid duplex performed November 22, 2019.  Patient also has a right IJ PermCath.  There is no indication for endovascular intervention at this time.  Recommend conservative treatment with aspirin Plavix ASA 81mg  daily Plavix 75mg  daily  2.  End-stage renal disease: Patient is currently maintained by a right IJ PermCath which seems to be functioning without issue.  On physical exam, his PermCath does not look to be infected.  After speaking with the patient and his daughter who is at the bedside it seems as leg the patient would like to proceed with peritoneal dialysis.  We are more than happy to place the peritoneal dialysis catheter when the patient is ready.  3. AAA: Patient  is status post endovascular AAA repair. Patient and his daughter who was at the bedside deny any issues since his repair. Patient should keep his current follow-up to continue to be monitored.  Discussed with Dr. Mayme Genta, PA-C  11/22/2019 2:42 PM  This note was created with Dragon medical transcription system.  Any error is purely unintentional

## 2019-11-22 NOTE — Progress Notes (Signed)
OT Cancellation Note  Patient Details Name: Scott Oconnor MRN: [id number] DOB: [birth year]   Cancelled Treatment:    Reason Eval/Treat Not Completed: Patient at procedure or test/ unavailable. Upon arrival pt found to be off floor for stress test. Will continue to follow pt acutely and initiate services as able.   Dessie Coma, M.S. OTR/L  11/22/19, 12:18 PM

## 2019-11-22 NOTE — Progress Notes (Signed)
[facility]  ROUNDING NOTE   Subjective:   Daughter at bedside. Reports patient was more alert and oriented yesterday evening.  However lethargic this morning.   Nuclear stress test for today.   Found to have a right internal jugular thrombus incidentally on carotid dopplers.   Objective:  Vital signs in last 24 hours:  Temp:  [97.4 F (36.3 C)-99 F (37.2 C)] 98.2 F (36.8 C) (07/02 1017) Pulse Rate:  [62-118] 118 (07/02 1017) Resp:  [16-20] 18 (07/02 1017) BP: (128-192)/(71-110) 154/80 (07/02 1017) SpO2:  [96 %-100 %] 100 % (07/02 1017) Weight:  [42.2 kg-49.4 kg] 42.2 kg (07/02 0237)  Weight change: -2.8 kg Filed Weights   11/21/19 0500 11/21/19 1338 11/22/19 0237  Weight: 52.2 kg 49.4 kg 42.2 kg    Intake/Output: I/O last 3 completed shifts: In: 260 [P.O.:260] Out: -497    Intake/Output this shift:  Total I/O In: 30 [P.O.:30] Out: -   Physical Exam: General: Laying in bed  Head: Normocephalic, atraumatic. Moist oral mucosal membranes  Eyes: Anicteric, PERRL  Neck: Supple, trachea midline  Lungs:  clear  Heart: Regular rate and rhythm  Abdomen:  Soft, nontender,   Extremities:  no peripheral edema.  Neurologic: Nonfocal, moving all four extremities  Skin: No lesions  Access: RIJ permcath    Basic Metabolic Panel: Recent Labs  Lab 11/18/19 2118 11/18/19 2118 11/19/19 0129 11/19/19 0407 11/19/19 0407 11/20/19 0424 11/21/19 0730 11/22/19 0603  NA 135  --   --  140  --  139 141 141  K 4.5  --   --  4.3  --  4.0 4.6 3.7  CL 99  --   --  100  --  100 100 99  CO2 22  --   --  26  --  26 26 26   GLUCOSE 254*  --   --  155*  --  123* 111* 122*  BUN 53*  --   --  37*  --  54* 39* 42*  CREATININE 7.13*  --   --  4.97*  --  6.97* 5.54* 5.07*  CALCIUM 8.3*   < >  --  9.1   < > 8.6* 9.4 9.0  MG  --   --  1.9  --   --  2.0 1.9 2.1  PHOS  --   --  4.2  --   --   --   --  8.6*   < > = values in this interval not displayed.    Liver Function  Tests: Recent Labs  Lab 11/18/19 2118 11/20/19 0424  AST 43* 38  ALT 59* 69*  ALKPHOS 97 92  BILITOT 0.7 0.7  PROT 7.5 6.5  ALBUMIN 3.5 3.2*   No results for input(s): LIPASE, AMYLASE in the last 168 hours. No results for input(s): AMMONIA in the last 168 hours.  CBC: Recent Labs  Lab 11/18/19 2118 11/20/19 0424 11/21/19 0730 11/22/19 0603  WBC 15.5* 9.0 9.0 9.5  NEUTROABS 6.2 4.8 4.8 4.9  HGB 11.0* 9.7* 10.8* 10.5*  HCT 34.2* 28.6* 32.0* 32.0*  MCV 102.4* 96.3 95.8 99.4  PLT 144* 132* 163 164    Cardiac Enzymes: No results for input(s): CKTOTAL, CKMB, CKMBINDEX, TROPONINI in the last 168 hours.  BNP: Invalid input(s): POCBNP  CBG: Recent Labs  Lab 11/18/19 2353 11/20/19 1442  GLUCAP 153* 90    Microbiology: Results for orders placed or performed during the hospital encounter of 11/18/19  SARS Coronavirus 2  by RT PCR (hospital order, performed in [facility] hospital lab) Nasopharyngeal Nasopharyngeal Swab     Status: None   Collection Time: 11/18/19 10:59 PM   Specimen: Nasopharyngeal Swab  Result Value Ref Range Status   SARS Coronavirus 2 NEGATIVE NEGATIVE Final    Comment: (NOTE) SARS-CoV-2 target nucleic acids are NOT DETECTED.  The SARS-CoV-2 RNA is generally detectable in upper and lower respiratory specimens during the acute phase of infection. The lowest concentration of SARS-CoV-2 viral copies this assay can detect is 250 copies / mL. A negative result does not preclude SARS-CoV-2 infection and should not be used as the sole basis for treatment or other patient management decisions.  A negative result may occur with improper specimen collection / handling, submission of specimen other than nasopharyngeal swab, presence of viral mutation(s) within the areas targeted by this assay, and inadequate number of viral copies (<250 copies / mL). A negative result must be combined with clinical observations, patient history, and epidemiological  information.  Fact Sheet for Patients:   StrictlyIdeas.no  Fact Sheet for Healthcare Providers: BankingDealers.co.za  This test is not yet approved or  cleared by the [place] FDA and has been authorized for detection and/or diagnosis of SARS-CoV-2 by FDA under an Emergency Use Authorization (EUA).  This EUA will remain in effect (meaning this test can be used) for the duration of the COVID-19 declaration under Section 564(b)(1) of the Act, 21 U.S.C. section 360bbb-3(b)(1), unless the authorization is terminated or revoked sooner.  Performed at [facility], [address] [place], [place] [address]   MRSA PCR Screening     Status: None   Collection Time: 11/19/19 12:00 AM   Specimen: Nasopharyngeal  Result Value Ref Range Status   MRSA by PCR NEGATIVE NEGATIVE Final    Comment:        The GeneXpert MRSA Assay (FDA approved for NASAL specimens only), is one component of a comprehensive MRSA colonization surveillance program. It is not intended to diagnose MRSA infection nor to guide or monitor treatment for MRSA infections. Performed at [facility], [address] [place], [place] [address]     Coagulation Studies: No results for input(s): LABPROT, INR in the last 72 hours.  Urinalysis: No results for input(s): COLORURINE, LABSPEC, PHURINE, GLUCOSEU, HGBUR, BILIRUBINUR, KETONESUR, PROTEINUR, UROBILINOGEN, NITRITE, LEUKOCYTESUR in the last 72 hours.  Invalid input(s): APPERANCEUR    Imaging: MR BRAIN WO CONTRAST  Result Date: 11/21/2019 CLINICAL DATA:  Encephalopathy EXAM: MRI HEAD WITHOUT CONTRAST TECHNIQUE: Multiplanar, multiecho pulse sequences of the brain and surrounding structures were obtained without intravenous contrast. COMPARISON:  None. FINDINGS: Motion artifact is present. Brain: Small foci reduced diffusion are present in the left centrum semiovale, right thalamus, and right  cerebellum on axial DWI. Not all are confirmed on coronal DWI. There are chronic small vessel infarcts the basal ganglia and thalamus bilaterally. Additional few small chronic infarcts of cerebellum. Otherwise patchy and confluent areas of T2 hyperintensity in the supratentorial and pontine white matter are nonspecific but probably reflect moderate chronic microvascular ischemic changes. There is susceptibility in region of the lateral right precentral gyrus compatible with chronic blood products or less likely mineralization. Ventricles and sulci are within limits in size and configuration. There is no intracranial mass or significant mass effect. There is no hydrocephalus or extra-axial fluid collection. Vascular: Major vessel flow voids at the skull base are preserved. Skull and upper cervical spine: Normal marrow signal is preserved. Sinuses/Orbits: Posterior ethmoid mucosal thickening.  Orbits are unremarkable. Other: Sella is unremarkable.  Mastoid air cells are clear. IMPRESSION: Motion degraded. Possible small acute to subacute infarcts of the left centrum semiovale, right thalamus, and right cerebellum. Moderate chronic microvascular ischemic changes. Small chronic infarcts as described. Electronically Signed   By: Macy Mis M.D.   On: 11/21/2019 15:05   US Carotid Bilateral  Result Date: 11/22/2019 CLINICAL DATA:  74 year old male with stroke-like symptoms. EXAM: BILATERAL CAROTID DUPLEX ULTRASOUND TECHNIQUE: Pearline Cables scale imaging, color Doppler and duplex ultrasound were performed of bilateral carotid and vertebral arteries in the neck. COMPARISON:  None. FINDINGS: Criteria: Quantification of carotid stenosis is based on velocity parameters that correlate the residual internal carotid diameter with NASCET-based stenosis levels, using the diameter of the distal internal carotid lumen as the denominator for stenosis measurement. The following velocity measurements were obtained: RIGHT ICA: 51/21 cm/sec  CCA: 16/07 cm/sec SYSTOLIC ICA/CCA RATIO:  1.1 ECA:  44 cm/sec LEFT ICA: 54/19 cm/sec CCA: 37/10 cm/sec SYSTOLIC ICA/CCA RATIO:  0.9 ECA:  61 cm/sec RIGHT CAROTID ARTERY: Mild smooth heterogeneous atherosclerotic plaque in the proximal and mid internal carotid artery. By peak systolic velocity criteria, the estimated stenosis is less than 50%. RIGHT VERTEBRAL ARTERY:  Patent with normal antegrade flow. LEFT CAROTID ARTERY: Focal partially calcified atherosclerotic plaque in the distal common carotid artery. Minimal smooth heterogeneous plaque extends into the proximal internal carotid artery. By peak systolic velocity criteria, the estimated stenosis remains less than 50%. LEFT VERTEBRAL ARTERY:  Patent with normal antegrade flow. Other: Imaging of the internal jugular vein demonstrates and expanded, incompletely compressible vein with wall adherent internal echogenic material consistent with subocclusive thrombus. The size of the thrombus is fairly large. IMPRESSION: 1. Positive for incidental finding of large volume nearly occlusive deep venous thrombosis in the right internal jugular vein. 2. Mild (1-49%) stenosis proximal right internal carotid artery secondary to mild smooth heterogeneous atherosclerotic plaque. 3. Mild (1-49%) stenosis proximal left internal carotid artery secondary to trace smooth heterogeneous atherosclerotic plaque. 4. Vertebral arteries are patent with normal antegrade flow. These results will be called to the ordering clinician or representative by the Radiologist Assistant, and communication documented in the PACS or [employer]. Electronically Signed   By: Jacqulynn Cadet M.D.   On: 11/22/2019 09:53     Medications:    . ALPRAZolam  0.25 mg Oral Once  . aspirin EC  81 mg Oral Daily  . carvedilol  6.25 mg Oral BID WC  . chlorhexidine  15 mL Mouth Rinse BID  . Chlorhexidine Gluconate Cloth  6 each Topical Q0600  . famotidine  20 mg Oral Daily  . feeding supplement  (NEPRO CARB STEADY)  237 mL Oral BID AC & HS  . furosemide  60 mg Intravenous BID  . heparin  5,000 Units Subcutaneous Q8H  . irbesartan  150 mg Oral QPM  . mouth rinse  15 mL Mouth Rinse q12n4p  . mirtazapine  15 mg Oral QHS  . multivitamin  1 tablet Oral QHS  . tiotropium  18 mcg Inhalation Daily     Assessment/ Plan:  Mr. Susumu Hackler is a 74 y.o. [place] male with acute renal failure from cholesterol emboli requiring hemodialysis, hypertension, peripheral vascular disease, hyperlipidemia, who is admitted to [facility] on 11/18/2019 for Acute pulmonary edema ([place]) [J81.0] End stage renal disease on dialysis ([place]) [N18.6, Z99.2] Acute respiratory failure with hypoxia ([facility]) [J96.01]   [facility] TTS [facility] RIJ permcath 52.5kg  1. End stage renal disease requiring  hemodialysis: emergent hemodialysis treatment on admission. Required dialysis for two consecutive days. Total UF of over 6 liters.  Monitor daily for dialysis need.  Next dialysis treatment scheduled for tomorrow and resume TTS schedule.   2. Hypertension: hypertensive emergency on admission. Home regimen of amlodipine, hydralazine, labetalol, and torsemide.  - IV furosemide.   3. Anemia with renal failure: hemoglobin 10.5. Hold EPO due to ishemic changes.   4. Secondary Hyperparathyroidism: with hyperphosphatemia. Not currently on binders. Discussed binders with patient and daughter.   5. Acute right internal jugular thrombus: evidence for treating with anti-coagulation is limited. - Consult vascular  6. Acute cerebrovascular accident: with altered mental status. Was previously on Aspirin 81mg  at home.  Appreciate neurology input.   7. 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 Appreciate cardiology input.    LOS: 4 Aurie Harroun 7/2/202111:37 AM

## 2019-11-22 NOTE — Progress Notes (Signed)
PT Cancellation Note  Patient Details Name: Blayde Bacigalupi MRN: [id number] DOB: 03-25-1946   Cancelled Treatment:    Reason Eval/Treat Not Completed: Other (comment).  Chart reviewed.  Upon PT arrival to pt's room, pt was eating; daughter present.  [name] reporting pt ambulated with staff last night (no AD use) to bathroom and back.  Daughter discussed having therapist return later after pt finished eating but unfortunately this therapist would not be able to return d/t schedule.  Pt then appearing to be finishing with eating and pt's daughter asked pt if he would participate in physical therapy evaluation but pt declined.  Will re-attempt PT evaluation at a later date/time.  Leitha Bleak, PT 11/22/19, 4:48 PM

## 2019-11-22 NOTE — Progress Notes (Signed)
Check patient O2 Sats. on RA-  Patient is at 96% RA at this time.  Patient sounds congested while coughing.   Paged MD   MD ordered mucinex and gave ok to order flutter valve.

## 2019-11-22 NOTE — Progress Notes (Signed)
PT Cancellation Note  Patient Details Name: Scott Oconnor MRN: [id number] DOB: 1946/03/03   Cancelled Treatment:    Reason Eval/Treat Not Completed: Patient at procedure or test/unavailable.  Chart reviewed.  Pt not in room.  Nurse reports pt currently off floor for testing.  Will re-attempt PT evaluation at a later date/time as able.  Leitha Bleak, PT 11/22/19, 11:45 AM

## 2019-11-22 NOTE — Care Management Important Message (Signed)
Important Message  Patient Details  Name: Scott Oconnor MRN: [id number] Date of Birth: 10/31/1945   Medicare Important Message Given:  Yes     Dannette Barbara 11/22/2019, 10:44 AM

## 2019-11-22 NOTE — Consult Note (Signed)
ANTICOAGULATION CONSULT NOTE - Initial Consult  Pharmacy Consult for Heparin Drip Indication: DVT (VTE treatment)  Allergies  Allergen Reactions   Ivp Dye [Iodinated Diagnostic Agents]     ESRD patient     Patient Measurements: Height: 5\' 6"  (167.6 cm) Weight: 42.2 kg (93 lb) IBW/kg (Calculated) : 63.8 Heparin Dosing Weight: 42.2kg  Vital Signs: Temp: 98.2 F (36.8 C) (07/02 1017) Temp Source: Oral (07/02 1017) BP: 154/80 (07/02 1017) Pulse Rate: 118 (07/02 1017)  Labs: Recent Labs    11/20/19 0424 11/20/19 0424 11/21/19 0730 11/22/19 0603  HGB 9.7*   < > 10.8* 10.5*  HCT 28.6*  --  32.0* 32.0*  PLT 132*  --  163 164  CREATININE 6.97*  --  5.54* 5.07*   < > = values in this interval not displayed.    Estimated Creatinine Clearance: 7.7 mL/min (A) (by C-G formula based on SCr of 5.07 mg/dL (H)).   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  Goal of Therapy:  Heparin level 0.3-0.7 units/ml Monitor platelets by anticoagulation protocol: Yes   Plan:  Will obtain baseline APTT and INR labs Give 2700 units bolus x 1 Start heparin infusion at 650 units/hr Check anti-Xa level in 8 hours and daily while on heparin Continue to monitor H&H and platelets  Lu Duffel, PharmD, BCPS Clinical Pharmacist 11/22/2019 12:49 PM;

## 2019-11-22 NOTE — Progress Notes (Signed)
Patient unable to sign consent in procedural area.   Daughter went home for a bit.   Called Tanya over phone to get verbal consent.   Respiratory Therapist assisting with procedure was my 2nd witness for a verbal consent obtained by phone.

## 2019-11-23 ENCOUNTER — Encounter: Payer: Self-pay | Admitting: Nephrology

## 2019-11-23 DIAGNOSIS — I82C11 Acute embolism and thrombosis of right internal jugular vein: Secondary | ICD-10-CM

## 2019-11-23 DIAGNOSIS — G9341 Metabolic encephalopathy: Secondary | ICD-10-CM

## 2019-11-23 LAB — BASIC METABOLIC PANEL
Anion gap: 17 — ABNORMAL HIGH (ref 5–15)
BUN: 61 mg/dL — ABNORMAL HIGH (ref 8–23)
CO2: 24 mmol/L (ref 22–32)
Calcium: 8.8 mg/dL — ABNORMAL LOW (ref 8.9–10.3)
Chloride: 97 mmol/L — ABNORMAL LOW (ref 98–111)
Creatinine, Ser: 7.3 mg/dL — ABNORMAL HIGH (ref 0.61–1.24)
GFR calc Af Amer: 8 mL/min — ABNORMAL LOW (ref >60)
GFR calc non Af Amer: 7 mL/min — ABNORMAL LOW (ref >60)
Glucose, Bld: 137 mg/dL — ABNORMAL HIGH (ref 70–99)
Potassium: 3.6 mmol/L (ref 3.5–5.1)
Sodium: 138 mmol/L (ref 135–145)

## 2019-11-23 LAB — RENAL FUNCTION PANEL
Albumin: 3.4 g/dL — ABNORMAL LOW (ref 3.5–5.0)
Anion gap: 17 — ABNORMAL HIGH (ref 5–15)
BUN: 62 mg/dL — ABNORMAL HIGH (ref 8–23)
CO2: 24 mmol/L (ref 22–32)
Calcium: 8.8 mg/dL — ABNORMAL LOW (ref 8.9–10.3)
Chloride: 97 mmol/L — ABNORMAL LOW (ref 98–111)
Creatinine, Ser: 7.55 mg/dL — ABNORMAL HIGH (ref 0.61–1.24)
GFR calc Af Amer: 7 mL/min — ABNORMAL LOW (ref >60)
GFR calc non Af Amer: 6 mL/min — ABNORMAL LOW (ref >60)
Glucose, Bld: 136 mg/dL — ABNORMAL HIGH (ref 70–99)
Phosphorus: 8.2 mg/dL — ABNORMAL HIGH (ref 2.5–4.6)
Potassium: 3.6 mmol/L (ref 3.5–5.1)
Sodium: 138 mmol/L (ref 135–145)

## 2019-11-23 LAB — CBC WITH DIFFERENTIAL/PLATELET
Abs Immature Granulocytes: 0.04 10*3/uL (ref 0.00–0.07)
Basophils Absolute: 0.1 10*3/uL (ref 0.0–0.1)
Basophils Relative: 1 %
Eosinophils Absolute: 0.4 10*3/uL (ref 0.0–0.5)
Eosinophils Relative: 4 %
HCT: 32 % — ABNORMAL LOW (ref 39.0–52.0)
Hemoglobin: 10.6 g/dL — ABNORMAL LOW (ref 13.0–17.0)
Immature Granulocytes: 0 %
Lymphocytes Relative: 24 %
Lymphs Abs: 2.4 10*3/uL (ref 0.7–4.0)
MCH: 32.8 pg (ref 26.0–34.0)
MCHC: 33.1 g/dL (ref 30.0–36.0)
MCV: 99.1 fL (ref 80.0–100.0)
Monocytes Absolute: 1.1 10*3/uL — ABNORMAL HIGH (ref 0.1–1.0)
Monocytes Relative: 11 %
Neutro Abs: 6.2 10*3/uL (ref 1.7–7.7)
Neutrophils Relative %: 60 %
Platelets: 161 10*3/uL (ref 150–400)
RBC: 3.23 MIL/uL — ABNORMAL LOW (ref 4.22–5.81)
RDW: 16.4 % — ABNORMAL HIGH (ref 11.5–15.5)
WBC: 10.2 10*3/uL (ref 4.0–10.5)
nRBC: 0 % (ref 0.0–0.2)

## 2019-11-23 LAB — MAGNESIUM: Magnesium: 2.1 mg/dL (ref 1.7–2.4)

## 2019-11-23 LAB — PHOSPHORUS: Phosphorus: 8.2 mg/dL — ABNORMAL HIGH (ref 2.5–4.6)

## 2019-11-23 MED ORDER — ALBUTEROL SULFATE HFA 108 (90 BASE) MCG/ACT IN AERS
2.0000 | INHALATION_SPRAY | Freq: Four times a day (QID) | RESPIRATORY_TRACT | 0 refills | Status: DC | PRN
Start: 2019-11-23 — End: 2020-05-06

## 2019-11-23 MED ORDER — NEPRO/CARBSTEADY PO LIQD: 237.0000 mL | Freq: Two times a day (BID) | ORAL | 0 refills | Status: DC

## 2019-11-23 MED ORDER — MUCINEX 600 MG PO TB12
600.0000 mg | ORAL_TABLET | Freq: Two times a day (BID) | ORAL | 0 refills | Status: DC
Start: 2019-11-23 — End: 2020-04-10

## 2019-11-23 MED ORDER — CARVEDILOL 6.25 MG PO TABS: 6.2500 mg | ORAL_TABLET | Freq: Two times a day (BID) | ORAL | 2 refills | Status: DC

## 2019-11-23 MED ORDER — APIXABAN 5 MG PO TABS
10.0000 mg | ORAL_TABLET | Freq: Two times a day (BID) | ORAL | Status: DC
  Administered 2019-11-23: 10 mg via ORAL
  Filled 2019-11-23: qty 2

## 2019-11-23 MED ORDER — MUSCLE RUB 10-15 % EX CREA
TOPICAL_CREAM | CUTANEOUS | Status: DC | PRN
  Filled 2019-11-23: qty 85

## 2019-11-23 MED ORDER — TORSEMIDE 20 MG PO TABS: 40.0000 mg | ORAL_TABLET | Freq: Every day | ORAL | Status: DC

## 2019-11-23 MED ORDER — APIXABAN (ELIQUIS) VTE STARTER PACK (10MG AND 5MG)
ORAL_TABLET | ORAL | 2 refills | Status: DC
Start: 2019-11-23 — End: 2019-12-03

## 2019-11-23 MED ORDER — ATORVASTATIN CALCIUM 40 MG PO TABS: 40.0000 mg | ORAL_TABLET | Freq: Every day | ORAL | 2 refills | Status: DC

## 2019-11-23 MED ORDER — DOCUSATE SODIUM 100 MG PO CAPS: 100.0000 mg | ORAL_CAPSULE | Freq: Two times a day (BID) | ORAL | 0 refills | Status: DC | PRN

## 2019-11-23 MED ORDER — APIXABAN 5 MG PO TABS: 5.0000 mg | ORAL_TABLET | Freq: Two times a day (BID) | ORAL | Status: DC

## 2019-11-23 MED ORDER — TORSEMIDE 20 MG PO TABS: 40.0000 mg | ORAL_TABLET | Freq: Every day | ORAL | 1 refills | Status: DC

## 2019-11-23 NOTE — Progress Notes (Signed)
250 ml bolus given to improve bp and perfusion per MD Harmeet Singh by verbal order at bedside

## 2019-11-23 NOTE — Progress Notes (Signed)
Progress Note  Patient Name: Scott Oconnor Date of Encounter: 11/23/2019  [facility] Cardiologist: Ida Rogue, MD   Subjective   Patient feels well today.  He denies chest pain, shortness of breath, palpitations, lightheadedness, and focal weakness/numbness.  He was able to walk last night and this morning without difficulty.  He did not require supplemental oxygen overnight.  Inpatient Medications    Scheduled Meds: . aspirin EC  81 mg Oral Daily  . atorvastatin  40 mg Oral Daily  . carvedilol  6.25 mg Oral BID WC  . chlorhexidine  15 mL Mouth Rinse BID  . Chlorhexidine Gluconate Cloth  6 each Topical Q0600  . clopidogrel  75 mg Oral Daily  . dextromethorphan-guaiFENesin  1 tablet Oral BID  . famotidine  20 mg Oral Daily  . feeding supplement (NEPRO CARB STEADY)  237 mL Oral BID AC & HS  . furosemide  60 mg Intravenous BID  . heparin injection (subcutaneous)  5,000 Units Subcutaneous Q8H  . hydrALAZINE  25 mg Oral TID  . irbesartan  150 mg Oral QPM  . mouth rinse  15 mL Mouth Rinse q12n4p  . multivitamin  1 tablet Oral QHS  . tiotropium  18 mcg Inhalation Daily   Continuous Infusions:  PRN Meds: acetaminophen, albuterol, docusate sodium, ipratropium-albuterol, labetalol, Muscle Rub, polyethylene glycol   Vital Signs    Vitals:   11/22/19 1925 11/23/19 0022 11/23/19 0354 11/23/19 0726  BP: (!) 156/77 116/66 115/68 (!) 141/80  Pulse: 80 79 69 65  Resp: 19  19 18   Temp: 98.8 F (37.1 C)   98.1 F (36.7 C)  TempSrc: Oral   Oral  SpO2: 100%  97% 94%  Weight:   44.8 kg   Height:        Intake/Output Summary (Last 24 hours) at 11/23/2019 0814 Last data filed at 11/23/2019 3810 Gross per 24 hour  Intake 30 ml  Output 0 ml  Net 30 ml   Last 3 Weights 11/23/2019 11/22/2019 11/21/2019  Weight (lbs) 98 lb 12.8 oz 93 lb 108 lb 14.5 oz  Weight (kg) 44.815 kg 42.185 kg 49.4 kg      Telemetry    Sinus rhythm with PACs - Personally Reviewed  ECG    No new  tracing  Physical Exam   GEN: No acute distress.   Neck: No JVD.  Tunneled right IJ catheter in place. Cardiac:  Regular rate and rhythm with occasional extrasystoles.  No murmurs, rubs, or gallops. Respiratory:  Coarse breath sounds, diminished at the lung bases. GI: Soft, nontender, non-distended  MS: No edema; No deformity. Neuro:  Nonfocal  Psych: Normal affect   Labs    High Sensitivity Troponin:   Recent Labs  Lab 11/18/19 2118 11/18/19 2302 11/19/19 0129 11/19/19 0407  TROPONINIHS 79* 192* 390* 348*      Chemistry Recent Labs  Lab 11/18/19 2118 11/19/19 0407 11/20/19 0424 11/21/19 0730 11/22/19 0603  NA 135   < > 139 141 141  K 4.5   < > 4.0 4.6 3.7  CL 99   < > 100 100 99  CO2 22   < > 26 26 26   GLUCOSE 254*   < > 123* 111* 122*  BUN 53*   < > 54* 39* 42*  CREATININE 7.13*   < > 6.97* 5.54* 5.07*  CALCIUM 8.3*   < > 8.6* 9.4 9.0  PROT 7.5  --  6.5  --   --   ALBUMIN  3.5  --  3.2*  --   --   AST 43*  --  38  --   --   ALT 59*  --  69*  --   --   ALKPHOS 97  --  92  --   --   BILITOT 0.7  --  0.7  --   --   GFRNONAA 7*   < > 7* 9* 10*  GFRAA 8*   < > 8* 11* 12*  ANIONGAP 14   < > 13 15 16*   < > = values in this interval not displayed.     Hematology Recent Labs  Lab 11/21/19 0730 11/22/19 0603 11/23/19 0648  WBC 9.0 9.5 10.2  RBC 3.34* 3.22* 3.23*  HGB 10.8* 10.5* 10.6*  HCT 32.0* 32.0* 32.0*  MCV 95.8 99.4 99.1  MCH 32.3 32.6 32.8  MCHC 33.8 32.8 33.1  RDW 16.4* 16.1* 16.4*  PLT 163 164 161    BNP Recent Labs  Lab 11/18/19 2118  BNP >4,500.0*     DDimer No results for input(s): DDIMER in the last 168 hours.   Radiology    MR BRAIN WO CONTRAST  Result Date: 11/21/2019 CLINICAL DATA:  Encephalopathy EXAM: MRI HEAD WITHOUT CONTRAST TECHNIQUE: Multiplanar, multiecho pulse sequences of the brain and surrounding structures were obtained without intravenous contrast. COMPARISON:  None. FINDINGS: Motion artifact is present. Brain:  Small foci reduced diffusion are present in the left centrum semiovale, right thalamus, and right cerebellum on axial DWI. Not all are confirmed on coronal DWI. There are chronic small vessel infarcts the basal ganglia and thalamus bilaterally. Additional few small chronic infarcts of cerebellum. Otherwise patchy and confluent areas of T2 hyperintensity in the supratentorial and pontine white matter are nonspecific but probably reflect moderate chronic microvascular ischemic changes. There is susceptibility in region of the lateral right precentral gyrus compatible with chronic blood products or less likely mineralization. Ventricles and sulci are within limits in size and configuration. There is no intracranial mass or significant mass effect. There is no hydrocephalus or extra-axial fluid collection. Vascular: Major vessel flow voids at the skull base are preserved. Skull and upper cervical spine: Normal marrow signal is preserved. Sinuses/Orbits: Posterior ethmoid mucosal thickening. Orbits are unremarkable. Other: Sella is unremarkable.  Mastoid air cells are clear. IMPRESSION: Motion degraded. Possible small acute to subacute infarcts of the left centrum semiovale, right thalamus, and right cerebellum. Moderate chronic microvascular ischemic changes. Small chronic infarcts as described. Electronically Signed   By: Macy Mis M.D.   On: 11/21/2019 15:05   US Carotid Bilateral  Result Date: 11/22/2019 CLINICAL DATA:  74 year old male with stroke-like symptoms. EXAM: BILATERAL CAROTID DUPLEX ULTRASOUND TECHNIQUE: Pearline Cables scale imaging, color Doppler and duplex ultrasound were performed of bilateral carotid and vertebral arteries in the neck. COMPARISON:  None. FINDINGS: Criteria: Quantification of carotid stenosis is based on velocity parameters that correlate the residual internal carotid diameter with NASCET-based stenosis levels, using the diameter of the distal internal carotid lumen as the denominator for  stenosis measurement. The following velocity measurements were obtained: RIGHT ICA: 51/21 cm/sec CCA: 16/10 cm/sec SYSTOLIC ICA/CCA RATIO:  1.1 ECA:  44 cm/sec LEFT ICA: 54/19 cm/sec CCA: 96/04 cm/sec SYSTOLIC ICA/CCA RATIO:  0.9 ECA:  61 cm/sec RIGHT CAROTID ARTERY: Mild smooth heterogeneous atherosclerotic plaque in the proximal and mid internal carotid artery. By peak systolic velocity criteria, the estimated stenosis is less than 50%. RIGHT VERTEBRAL ARTERY:  Patent with normal antegrade flow. LEFT CAROTID  ARTERY: Focal partially calcified atherosclerotic plaque in the distal common carotid artery. Minimal smooth heterogeneous plaque extends into the proximal internal carotid artery. By peak systolic velocity criteria, the estimated stenosis remains less than 50%. LEFT VERTEBRAL ARTERY:  Patent with normal antegrade flow. Other: Imaging of the internal jugular vein demonstrates and expanded, incompletely compressible vein with wall adherent internal echogenic material consistent with subocclusive thrombus. The size of the thrombus is fairly large. IMPRESSION: 1. Positive for incidental finding of large volume nearly occlusive deep venous thrombosis in the right internal jugular vein. 2. Mild (1-49%) stenosis proximal right internal carotid artery secondary to mild smooth heterogeneous atherosclerotic plaque. 3. Mild (1-49%) stenosis proximal left internal carotid artery secondary to trace smooth heterogeneous atherosclerotic plaque. 4. Vertebral arteries are patent with normal antegrade flow. These results will be called to the ordering clinician or representative by the Radiologist Assistant, and communication documented in the PACS or [employer]. Electronically Signed   By: Jacqulynn Cadet M.D.   On: 11/22/2019 09:53   NM Myocar Multi W/Spect W/Wall Motion / EF  Result Date: 11/22/2019 Pharmacological myocardial perfusion imaging study with no significant  Ischemia GI uptake artifact noted Normal  wall motion, EF estimated at 44% No EKG changes concerning for ischemia at peak stress or in recovery. Baseline EKG with LVH, strain pattern, nonspecific ST abnormality CT attenuation correction images with mild three-vessel coronary calcification, mild aortic atherosclerosis,  right pleural effusion, AAA endograft stent noted Low to moderate risk scan Signed, Esmond Plants, MD, Ph.D [facility]    Cardiac Studies   TTE (11/19/19): 1. Left ventricular ejection fraction, by estimation, is 30 to 35%. The  left ventricle has moderately decreased function. The left ventricle  demonstrates global hypokinesis. There is mild left ventricular  hypertrophy. Left ventricular diastolic  parameters are consistent with Grade I diastolic dysfunction (impaired  relaxation). Elevated left atrial pressure.  2. Right ventricular systolic function is normal. The right ventricular  size is normal. There is moderately elevated pulmonary artery systolic  pressure.  3. Moderate pleural effusion in both left and right lateral regions.  4. The mitral valve is normal in structure. Mild mitral valve  regurgitation.  5. The aortic valve is tricuspid. Aortic valve regurgitation is trivial.  No aortic stenosis is present.  6. The inferior vena cava is normal in size with <50% respiratory  variability, suggesting right atrial pressure of 8 mmHg.   Pharmacologic myocardial perfusion stress test (11/22/19): Low to moderate risk scan without evidence of ischemia or significant scar.  LVEF 44%.  Coronary artery calcification, aortic atherosclerosis, and right pleural effusion noted on attenuation correction CT.  Patient Profile     74 y.o. male with history of AAA status post endovascular repair in 07/2669 complicated by acute kidney injury and initiation of hemodialysis, Raynaud's disease, hypertension, and hyperlipidemia, whom we are following due to acute systolic heart failure and hypertensive  emergency.  Assessment & Plan    Acute systolic heart failure: Mr. Grattan appears euvolemic on exam, though CT portion of stress test yesterday was notable for a small right pleural effusion.  LVEF by echo on admission was 30-35%.  Stress test yesterday showed 37 to 44% LVEF based on calculation method.  No significant ischemia or scar was noted, suggestive of nonischemic cardiomyopathy.  Continue carvedilol 6.25 mg twice daily.  Patient now on irbesartan 150 mg nightly.  If nephrology thinks it is reasonable to continue with this in the setting of advanced renal disease (patient hopes  that kidneys will recover to allow for cessation of HD), it would be beneficial from a cardiomyopathy standpoint.  Of note, he was taking irbesartan prior to admission.  Continue furosemide 60 mg IV twice daily.  Transition back to torsemide prior to discharge, though patient may need more than the 20 mg daily he was receiving prior to admission.  Ongoing hemodialysis with fluid removal per nephrology.  Given nonischemic myocardial perfusion stress test in the patient/family's desire to avoid IV contrast that could permanently injure kidneys, we will defer catheterization.  Hypertensive emergency: Overall, blood pressure much improved though some elevated readings persist.  We will tolerate a degree of permissive hypertension in the setting of acute stroke.  Continue current medications.  Will need to clarify with nephrology regarding continued ARB use, though it was a home medication prior to admission.  Stroke: Multifocal infarcts noted.  Findings concerning for embolic phenomenon.  Telemetry thus far without evidence of atrial fibrillation.  Continue aspirin and clopidogrel, though may need to consider antithrombotic therapy instead with right IJ thrombus.  Continue high intensity statin therapy.  Consider long-term ambulatory monitoring when patient is seen for follow-up by Dr. [name] +/-  TEE.  Right internal jugular thrombus: Incidentally noted in the setting of tunneled dialysis catheter on that side.  Per internal medicine, nephrology, and vascular surgery.  Patient currently on aspirin and clopidogrel.  I am uncertain if this is adequate anticoagulation in the setting, and antithrombotic therapy may need to be considered.  For questions or updates, please contact [place] Please consult [email or web address] for contact info under [facility].     Signed, Nelva Bush, MD  11/23/2019, 8:14 AM

## 2019-11-23 NOTE — Progress Notes (Signed)
Goal reduced to 1628ml per md verbal order from Dr. Murlean Iba

## 2019-11-23 NOTE — Progress Notes (Signed)
This note also relates to the following rows which could not be included: Pulse Rate - Cannot attach notes to unvalidated device data Resp - Cannot attach notes to unvalidated device data BP - Cannot attach notes to unvalidated device data  MD Harmeet Singh ordered pt to be increased to 2.5, 4K bath by verbal order

## 2019-11-23 NOTE — Consult Note (Signed)
[facility] for Apixaban Indication: DVT  Allergies  Allergen Reactions  . Ivp Dye [Iodinated Diagnostic Agents]     ESRD patient     Patient Measurements: Height: 5\' 6"  (167.6 cm) Weight: 44.8 kg (98 lb 12.8 oz) IBW/kg (Calculated) : 63.8  Vital Signs: Temp: 97.8 F (36.6 C) (07/03 1340) Temp Source: Oral (07/03 1340) BP: 117/62 (07/03 1340) Pulse Rate: 70 (07/03 1340)  Labs: Recent Labs    11/21/19 0730 11/21/19 0730 11/22/19 0603 11/22/19 1320 11/23/19 0648  HGB 10.8*   < > 10.5*  --  10.6*  HCT 32.0*  --  32.0*  --  32.0*  PLT 163  --  164  --  161  APTT  --   --   --  36  --   LABPROT  --   --   --  13.8  --   INR  --   --   --  1.1  --   CREATININE 5.54*  --  5.07*  --  7.55*  7.30*   < > = values in this interval not displayed.    Estimated Creatinine Clearance: 5.7 mL/min (A) (by C-G formula based on SCr of 7.3 mg/dL (H)).   Medical History: Past Medical History:  Diagnosis Date  . Hyperlipidemia   . Hypertension   . PVD (peripheral vascular disease) ([place])   . Reynolds syndrome [facility])     Medications:  Medications Prior to Admission  Medication Sig Dispense Refill Last Dose  . acetaminophen (TYLENOL) 500 MG tablet Take 500 mg by mouth every 8 (eight) hours as needed for moderate pain.    prn at prn  . [EXPIRED] benzonatate (TESSALON) 100 MG capsule Take by mouth.     . hydrALAZINE (APRESOLINE) 25 MG tablet Take 1 tablet (25 mg total) by mouth every 8 (eight) hours as needed (for BP >170). (Patient taking differently: Take 25-50 mg by mouth 3 (three) times daily as needed (for BP >170). ) 30 tablet 0 prn at prn  . irbesartan (AVAPRO) 150 MG tablet Take 150 mg by mouth every evening.    11/18/2019 at 2000  . labetalol (NORMODYNE) 100 MG tablet Take 1 tablet (100 mg total) by mouth 2 (two) times daily. 60 tablet 1 11/18/2019 at 1030  . megestrol (MEGACE) 40 MG/ML suspension Take by mouth.     . multivitamin  (RENA-VIT) TABS tablet Take 1 tablet by mouth at bedtime. 30 tablet 2 11/18/2019 at 2000  . torsemide (DEMADEX) 20 MG tablet Take 1 tablet (20 mg total) by mouth daily. 30 tablet 1 11/18/2019 at 1030  . amLODipine (NORVASC) 10 MG tablet Take 1 tablet (10 mg total) by mouth daily. (Patient not taking: Reported on 11/18/2019) 30 tablet 2 Not Taking  . amLODipine (NORVASC) 5 MG tablet Take 5 mg by mouth daily. (Patient not taking: Reported on 11/19/2019)   Not Taking at Unknown time  . aspirin EC 81 MG tablet Take 81 mg by mouth at bedtime.      . gabapentin (NEURONTIN) 100 MG capsule Take 100 mg by mouth at bedtime.   11/18/2019 at 2000  . nicotine (NICODERM CQ - DOSED IN MG/24 HOURS) 21 mg/24hr patch Place 1 patch (21 mg total) onto the skin daily. 28 patch 0    Scheduled:  . aspirin EC  81 mg Oral Daily  . atorvastatin  40 mg Oral Daily  . carvedilol  6.25 mg Oral BID WC  . chlorhexidine  15 mL Mouth Rinse BID  . Chlorhexidine Gluconate Cloth  6 each Topical Q0600  . dextromethorphan-guaiFENesin  1 tablet Oral BID  . famotidine  20 mg Oral Daily  . feeding supplement (NEPRO CARB STEADY)  237 mL Oral BID AC & HS  . hydrALAZINE  25 mg Oral TID  . irbesartan  150 mg Oral QPM  . mouth rinse  15 mL Mouth Rinse q12n4p  . multivitamin  1 tablet Oral QHS  . tiotropium  18 mcg Inhalation Daily  . [START ON 11/24/2019] torsemide  40 mg Oral Daily   Infusions:   PRN: acetaminophen, albuterol, docusate sodium, ipratropium-albuterol, labetalol, Muscle Rub, polyethylene glycol Anti-infectives (From admission, onward)   None      Assessment: Pharmacy consulted to start apixaban for DVT with ESRD.   Goal of Therapy:  Monitor platelets by anticoagulation protocol: Yes   Plan:  Apixaban 10 mg BID for 7 days followed by 5 mg BID.   Oswald Hillock 11/23/2019,2:06 PM

## 2019-11-23 NOTE — Progress Notes (Signed)
This note also relates to the following rows which could not be included: Pulse Rate - Cannot attach notes to unvalidated device data Resp - Cannot attach notes to unvalidated device data BP - Cannot attach notes to unvalidated device data  Called assigned floor nurse and informed them that the patient is having left leg pain and nurse stated she would report to Caryl Pina that the patient needed his medication

## 2019-11-23 NOTE — Progress Notes (Signed)
[facility]  ROUNDING NOTE   Subjective:      HEMODIALYSIS FLOWSHEET:  Blood Flow Rate (mL/min): 400 mL/min Arterial Pressure (mmHg): -170 mmHg Venous Pressure (mmHg): 140 mmHg Transmembrane Pressure (mmHg): 60 mmHg Ultrafiltration Rate (mL/min): 20 mL/min Dialysate Flow Rate (mL/min): 600 ml/min Conductivity: Machine : 14 Conductivity: Machine : 14 Dialysis Fluid Bolus: Normal Saline Bolus Amount (mL): 300 mL  Complained of cramping in the right leg   Objective:  Vital signs in last 24 hours:  Temp:  [97.5 F (36.4 C)-98.8 F (37.1 C)] 98 F (36.7 C) (07/03 0902) Pulse Rate:  [38-84] 78 (07/03 1200) Resp:  [13-26] 18 (07/03 1200) BP: (94-156)/(63-91) 105/74 (07/03 1200) SpO2:  [94 %-100 %] 98 % (07/03 0902) Weight:  [44.8 kg] 44.8 kg (07/03 0354)  Weight change: -4.585 kg Filed Weights   11/21/19 1338 11/22/19 0237 11/23/19 0354  Weight: 49.4 kg 42.2 kg 44.8 kg    Intake/Output: I/O last 3 completed shifts: In: 30 [P.O.:30] Out: 0    Intake/Output this shift:  No intake/output data recorded. Physical Exam: General:  No acute distress, laying in the bed, thin, cachectic  HEENT  anicteric, moist oral mucous membrane  Pulm/lungs  normal breathing effort, lungs are clear to auscultation, [place] O2  CVS/Heart  regular rhythm, no rub or gallop  Abdomen:   Soft, nontender  Extremities:  No peripheral edema  Neurologic:  Alert, able to follow commands  Skin:  No acute rashes  Right IJ PermCath     Basic Metabolic Panel: Recent Labs  Lab 11/18/19 2118 11/19/19 0129 11/19/19 0407 11/19/19 0407 11/20/19 0424 11/20/19 0424 11/21/19 0730 11/22/19 0603 11/23/19 0648  NA   < >  --  140  --  139  --  141 141 138   138  K   < >  --  4.3  --  4.0  --  4.6 3.7 3.6   3.6  CL   < >  --  100  --  100  --  100 99 97*   97*  CO2   < >  --  26  --  26  --  26 26 24   24   GLUCOSE   < >  --  155*  --  123*  --  111* 122* 136*   137*  BUN   < >  --  37*   --  54*  --  39* 42* 62*   61*  CREATININE   < >  --  4.97*  --  6.97*  --  5.54* 5.07* 7.55*   7.30*  CALCIUM   < >  --  9.1   < > 8.6*   < > 9.4 9.0 8.8*   8.8*  MG  --  1.9  --   --  2.0  --  1.9 2.1 2.1  PHOS  --  4.2  --   --   --   --   --  8.6* 8.2*   8.2*   < > = values in this interval not displayed.    Liver Function Tests: Recent Labs  Lab 11/18/19 2118 11/20/19 0424 11/23/19 0648  AST 43* 38  --   ALT 59* 69*  --   ALKPHOS 97 92  --   BILITOT 0.7 0.7  --   PROT 7.5 6.5  --   ALBUMIN 3.5 3.2* 3.4*   No results for input(s): LIPASE, AMYLASE in the last 168 hours. No results  for input(s): AMMONIA in the last 168 hours.  CBC: Recent Labs  Lab 11/18/19 2118 11/20/19 0424 11/21/19 0730 11/22/19 0603 11/23/19 0648  WBC 15.5* 9.0 9.0 9.5 10.2  NEUTROABS 6.2 4.8 4.8 4.9 6.2  HGB 11.0* 9.7* 10.8* 10.5* 10.6*  HCT 34.2* 28.6* 32.0* 32.0* 32.0*  MCV 102.4* 96.3 95.8 99.4 99.1  PLT 144* 132* 163 164 161    Cardiac Enzymes: No results for input(s): CKTOTAL, CKMB, CKMBINDEX, TROPONINI in the last 168 hours.  BNP: Invalid input(s): POCBNP  CBG: Recent Labs  Lab 11/18/19 2353 11/20/19 1442  GLUCAP 153* 90    Microbiology: Results for orders placed or performed during the hospital encounter of 11/18/19  SARS Coronavirus 2 by RT PCR (hospital order, performed in [facility] hospital lab) Nasopharyngeal Nasopharyngeal Swab     Status: None   Collection Time: 11/18/19 10:59 PM   Specimen: Nasopharyngeal Swab  Result Value Ref Range Status   SARS Coronavirus 2 NEGATIVE NEGATIVE Final    Comment: (NOTE) SARS-CoV-2 target nucleic acids are NOT DETECTED.  The SARS-CoV-2 RNA is generally detectable in upper and lower respiratory specimens during the acute phase of infection. The lowest concentration of SARS-CoV-2 viral copies this assay can detect is 250 copies / mL. A negative result does not preclude SARS-CoV-2 infection and should not be used as the sole basis  for treatment or other patient management decisions.  A negative result may occur with improper specimen collection / handling, submission of specimen other than nasopharyngeal swab, presence of viral mutation(s) within the areas targeted by this assay, and inadequate number of viral copies (<250 copies / mL). A negative result must be combined with clinical observations, patient history, and epidemiological information.  Fact Sheet for Patients:   StrictlyIdeas.no  Fact Sheet for Healthcare Providers: BankingDealers.co.za  This test is not yet approved or  cleared by the [place] FDA and has been authorized for detection and/or diagnosis of SARS-CoV-2 by FDA under an Emergency Use Authorization (EUA).  This EUA will remain in effect (meaning this test can be used) for the duration of the COVID-19 declaration under Section 564(b)(1) of the Act, 21 U.S.C. section 360bbb-3(b)(1), unless the authorization is terminated or revoked sooner.  Performed at [facility], [address] [place], [place] [address]   MRSA PCR Screening     Status: None   Collection Time: 11/19/19 12:00 AM   Specimen: Nasopharyngeal  Result Value Ref Range Status   MRSA by PCR NEGATIVE NEGATIVE Final    Comment:        The GeneXpert MRSA Assay (FDA approved for NASAL specimens only), is one component of a comprehensive MRSA colonization surveillance program. It is not intended to diagnose MRSA infection nor to guide or monitor treatment for MRSA infections. Performed at [facility], [address] [place], [place] [address]     Coagulation Studies: Recent Labs    11/22/19 1320  LABPROT 13.8  INR 1.1    Urinalysis: No results for input(s): COLORURINE, LABSPEC, PHURINE, GLUCOSEU, HGBUR, BILIRUBINUR, KETONESUR, PROTEINUR, UROBILINOGEN, NITRITE, LEUKOCYTESUR in the last 72 hours.  Invalid input(s): APPERANCEUR     Imaging: MR BRAIN WO CONTRAST  Result Date: 11/21/2019 CLINICAL DATA:  Encephalopathy EXAM: MRI HEAD WITHOUT CONTRAST TECHNIQUE: Multiplanar, multiecho pulse sequences of the brain and surrounding structures were obtained without intravenous contrast. COMPARISON:  None. FINDINGS: Motion artifact is present. Brain: Small foci reduced diffusion are present in the left centrum semiovale, right thalamus, and right cerebellum  on axial DWI. Not all are confirmed on coronal DWI. There are chronic small vessel infarcts the basal ganglia and thalamus bilaterally. Additional few small chronic infarcts of cerebellum. Otherwise patchy and confluent areas of T2 hyperintensity in the supratentorial and pontine white matter are nonspecific but probably reflect moderate chronic microvascular ischemic changes. There is susceptibility in region of the lateral right precentral gyrus compatible with chronic blood products or less likely mineralization. Ventricles and sulci are within limits in size and configuration. There is no intracranial mass or significant mass effect. There is no hydrocephalus or extra-axial fluid collection. Vascular: Major vessel flow voids at the skull base are preserved. Skull and upper cervical spine: Normal marrow signal is preserved. Sinuses/Orbits: Posterior ethmoid mucosal thickening. Orbits are unremarkable. Other: Sella is unremarkable.  Mastoid air cells are clear. IMPRESSION: Motion degraded. Possible small acute to subacute infarcts of the left centrum semiovale, right thalamus, and right cerebellum. Moderate chronic microvascular ischemic changes. Small chronic infarcts as described. Electronically Signed   By: Macy Mis M.D.   On: 11/21/2019 15:05   US Carotid Bilateral  Result Date: 11/22/2019 CLINICAL DATA:  74 year old male with stroke-like symptoms. EXAM: BILATERAL CAROTID DUPLEX ULTRASOUND TECHNIQUE: Pearline Cables scale imaging, color Doppler and duplex ultrasound were performed of  bilateral carotid and vertebral arteries in the neck. COMPARISON:  None. FINDINGS: Criteria: Quantification of carotid stenosis is based on velocity parameters that correlate the residual internal carotid diameter with NASCET-based stenosis levels, using the diameter of the distal internal carotid lumen as the denominator for stenosis measurement. The following velocity measurements were obtained: RIGHT ICA: 51/21 cm/sec CCA: 67/12 cm/sec SYSTOLIC ICA/CCA RATIO:  1.1 ECA:  44 cm/sec LEFT ICA: 54/19 cm/sec CCA: 45/80 cm/sec SYSTOLIC ICA/CCA RATIO:  0.9 ECA:  61 cm/sec RIGHT CAROTID ARTERY: Mild smooth heterogeneous atherosclerotic plaque in the proximal and mid internal carotid artery. By peak systolic velocity criteria, the estimated stenosis is less than 50%. RIGHT VERTEBRAL ARTERY:  Patent with normal antegrade flow. LEFT CAROTID ARTERY: Focal partially calcified atherosclerotic plaque in the distal common carotid artery. Minimal smooth heterogeneous plaque extends into the proximal internal carotid artery. By peak systolic velocity criteria, the estimated stenosis remains less than 50%. LEFT VERTEBRAL ARTERY:  Patent with normal antegrade flow. Other: Imaging of the internal jugular vein demonstrates and expanded, incompletely compressible vein with wall adherent internal echogenic material consistent with subocclusive thrombus. The size of the thrombus is fairly large. IMPRESSION: 1. Positive for incidental finding of large volume nearly occlusive deep venous thrombosis in the right internal jugular vein. 2. Mild (1-49%) stenosis proximal right internal carotid artery secondary to mild smooth heterogeneous atherosclerotic plaque. 3. Mild (1-49%) stenosis proximal left internal carotid artery secondary to trace smooth heterogeneous atherosclerotic plaque. 4. Vertebral arteries are patent with normal antegrade flow. These results will be called to the ordering clinician or representative by the Radiologist  Assistant, and communication documented in the PACS or [employer]. Electronically Signed   By: Jacqulynn Cadet M.D.   On: 11/22/2019 09:53   NM Myocar Multi W/Spect W/Wall Motion / EF  Result Date: 11/22/2019 Pharmacological myocardial perfusion imaging study with no significant  Ischemia GI uptake artifact noted Normal wall motion, EF estimated at 44% No EKG changes concerning for ischemia at peak stress or in recovery. Baseline EKG with LVH, strain pattern, nonspecific ST abnormality CT attenuation correction images with mild three-vessel coronary calcification, mild aortic atherosclerosis,  right pleural effusion, AAA endograft stent noted Low to moderate risk scan Signed,  Esmond Plants, MD, Ph.D [facility]     Medications:     aspirin EC  81 mg Oral Daily   atorvastatin  40 mg Oral Daily   carvedilol  6.25 mg Oral BID WC   chlorhexidine  15 mL Mouth Rinse BID   Chlorhexidine Gluconate Cloth  6 each Topical Q0600   clopidogrel  75 mg Oral Daily   dextromethorphan-guaiFENesin  1 tablet Oral BID   famotidine  20 mg Oral Daily   feeding supplement (NEPRO CARB STEADY)  237 mL Oral BID AC & HS   heparin injection (subcutaneous)  5,000 Units Subcutaneous Q8H   hydrALAZINE  25 mg Oral TID   irbesartan  150 mg Oral QPM   mouth rinse  15 mL Mouth Rinse q12n4p   multivitamin  1 tablet Oral QHS   tiotropium  18 mcg Inhalation Daily   [START ON 11/24/2019] torsemide  40 mg Oral Daily     Assessment/ Plan:  [name] is a 74 y.o. [place] male with acute renal failure from cholesterol emboli requiring hemodialysis, hypertension, peripheral vascular disease, hyperlipidemia, who is admitted to [facility] on 11/18/2019 for Acute pulmonary edema ([place]) [J81.0] End stage renal disease on dialysis ([place]) [N18.6, Z99.2] Acute respiratory failure with hypoxia ([facility]) [J96.01]   [facility] TTS [facility] RIJ permcath 52.5kg  1. End stage renal disease requiring hemodialysis:  With hypokalemia emergent hemodialysis treatment on admission. Required dialysis for two consecutive days. Total UF of over 6 liters.  Patient seen during dialysis today Volume appears to have optimized now Blood pressure is staying low therefore ultrafiltration decreased Change to 4K bath during dialysis  2. Hypertension: Blood pressure is now staying low Continue irbesartan at night Can consider holding hydralazine for systolic blood pressure less than 140 Change diuretic to oral  3. Anemia with renal failure:  Hold EPO for few days due to acute thrombus Lab Results  Component Value Date   HGB 10.6 (L) 11/23/2019     4. Secondary Hyperparathyroidism: with hyperphosphatemia. Not currently on binders.  Lab Results  Component Value Date   CALCIUM 8.8 (L) 11/23/2019   CALCIUM 8.8 (L) 11/23/2019   PHOS 8.2 (H) 11/23/2019   PHOS 8.2 (H) 11/23/2019      5. Acute right internal jugular thrombus:  -Agree with low-dose Eliquis for 3 months  6. Acute cerebrovascular accident: with altered mental status. Was previously on Aspirin 81mg  at home.  Appreciate neurology input.   7. 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 Appreciate cardiology input.    LOS: 5 Jeneane Pieczynski 7/3/20211:11 PM

## 2019-11-23 NOTE — Progress Notes (Signed)
Hemodialysis initiated without error or difficulty

## 2019-11-23 NOTE — Evaluation (Signed)
Occupational Therapy Evaluation Patient Details Name: Scott Oconnor MRN: [id number] DOB: [birth year] Today's Date: 11/23/2019    History of Present Illness [name] is a 74 y.o. [place] male with acute renal failure from cholesterol emboli requiring hemodialysis, hypertension, peripheral vascular disease, hyperlipidemia, who is admitted to [facility] on 11/18/2019 for Acute pulmonary edema    Clinical Impression   [name] was seen for OT evaluation this date. Prior to hospital admission, pt was Independent c mobility and ADLs. Pt lives c his wife, daughter, and grandson in two story home - wife and pt live on first floor. Pt presents to acute OT demonstrating impaired ADL performance and functional mobility 2/2 decreased activity tolerance, functional balance deficits, and decreased safety awareness. Pt currently requires SETUP self-feeding/grooming at bed level. Per PT report, pt requires CGA for ADL t/fs. [name] reports 24/7 assist at home including video surveillance. Pt would benefit from skilled OT to address noted impairments and functional limitations (see below for any additional details) in order to maximize safety and independence while minimizing falls risk and caregiver burden. Upon hospital discharge, recommend HHOT to maximize pt safety and return to functional independence during meaningful occupations of daily life.     Follow Up Recommendations  Home health OT    Equipment Recommendations  None recommended by OT    Recommendations for Other Services       Precautions / Restrictions Precautions Precautions: None Restrictions Weight Bearing Restrictions: No      Mobility Bed Mobility               General bed mobility comments: Deferred 2/2 pt fatigue   Transfers                 General transfer comment: Deferred 2/2 pt fatigue. Per PT, pt is CGA for t/f     Balance                                            ADL either performed or assessed with clinical judgement   ADL Overall ADL's : Needs assistance/impaired                                       General ADL Comments: SETUP self-feeding/grooming at bed level. Per PT report, CGA for ADL t/fs     Vision         Perception     Praxis      Pertinent Vitals/Pain Pain Assessment: No/denies pain     Hand Dominance     Extremity/Trunk Assessment Upper Extremity Assessment Upper Extremity Assessment: Overall WFL for tasks assessed (BUE 4+/5 grossly )   Lower Extremity Assessment Lower Extremity Assessment: Defer to PT evaluation       Communication Communication Communication: No difficulties (soft spoken )   Cognition Arousal/Alertness:  (Fatigued following HD ) Behavior During Therapy: WFL for tasks assessed/performed Overall Cognitive Status: Within Functional Limits for tasks assessed                                     General Comments  SpO2 95% on 0.5L [place]     Exercises Exercises: Other exercises Other Exercises Other Exercises: Pt and daughter educated  re: OT role, DME recs, d/c recs, ECS, IS (frequency and use), importance of mobility for functional strengthening Other Exercises: Meal setup, IS, simulated UBD   Shoulder Instructions      Home Living Family/patient expects to be discharged to:: Private residence Living Arrangements: Children;Spouse/significant other Available Help at Discharge: Family;Available 24 hours/day Type of Home: House Home Access: Stairs to enter [employer] of Steps: 1 Entrance Stairs-Rails: Can reach both Home Layout: Two level;Able to live on main level with bedroom/bathroom     Bathroom Shower/Tub: Walk-in shower         Home Equipment: Environmental consultant - 2 wheels;Shower seat - built in          Prior Functioning/Environment Level of Independence: Independent        Comments: Independent c I/ADLs. Uses 0.5L O2 at baseline on HD  days         OT Problem List: Decreased activity tolerance;Impaired balance (sitting and/or standing);Decreased knowledge of use of DME or AE      OT Treatment/Interventions: Self-care/ADL training;Therapeutic exercise;Energy conservation;DME and/or AE instruction;Therapeutic activities;Patient/family education;Balance training    OT Goals(Current goals can be found in the care plan section) Acute Rehab OT Goals Patient Stated Goal: To return home  OT Goal Formulation: With patient/family Time For Goal Achievement: 12/07/19 Potential to Achieve Goals: Good ADL Goals Pt Will Perform Grooming: with supervision;sitting Pt Will Perform Lower Body Dressing: with min assist;sit to/from stand (c LRAD PRN) Pt Will Transfer to Toilet: with supervision;ambulating;regular height toilet (c LRAD PRN) Additional ADL Goal #1: Pt will Independently verbalize plan to implement x3 ECS  OT Frequency: Min 1X/week   Barriers to D/C:            Co-evaluation              AM-PAC OT "6 Clicks" Daily Activity     Outcome Measure Help from another person eating meals?: None Help from another person taking care of personal grooming?: None Help from another person toileting, which includes using toliet, bedpan, or urinal?: A Little Help from another person bathing (including washing, rinsing, drying)?: A Little Help from another person to put on and taking off regular upper body clothing?: None Help from another person to put on and taking off regular lower body clothing?: A Little 6 Click Score: 21   End of Session    Activity Tolerance: Patient limited by fatigue Patient left: in bed;with call bell/phone within reach;with nursing/sitter in room;with family/visitor present  OT Visit Diagnosis: Unsteadiness on feet (R26.81)                Time: 1338-1400 OT Time Calculation (min): 22 min Charges:  OT General Charges $OT Visit: 1 Visit OT Evaluation $OT Eval Low Complexity: 1 Low OT  Treatments $Self Care/Home Management : 8-22 mins  Dessie Coma, M.S. OTR/L  11/23/19, 3:18 PM

## 2019-11-23 NOTE — Progress Notes (Signed)
Physical Therapy Evaluation Patient Details Name: Scott Oconnor MRN: [id number] DOB: 27-Dec-1945 Today's Date: 11/23/2019   History of Present Illness  Scott Oconnor is a 74 y.o. Scott Oconnor male with acute renal failure from cholesterol emboli requiring hemodialysis, hypertension, peripheral vascular disease, hyperlipidemia, who is admitted to [facility] on 11/18/2019 for Acute pulmonary edema. In addition pt with acute systolic CHF as well as acute/subacute cerebral infarcts.  Clinical Impression  Pt admitted with above diagnosis. Pt currently with functional limitations due to the deficits listed below (see PT Problem List).  Pt moves slowly but is able to perform bed mobility with use of bed rails but no external assist from therapist. During transfers he continues to move slowly but doesn't require external assist or support from therapist. Steady in standing without UE support. Pt is able to ambulate from room partially around RN station and back to bed. He demonstrates some occasional cross-over stepping and staggers slightly but is able to self-correct without external assist from therapist. Gait speed is slow but consistent. SpO2 remains at or greater than 95% on room air during ambulation. Finger to nose normal and pronator drift negative. Negative romberg but pt only able to maintain single leg balance for 3-4s on each side. No focal weakness or N/T. Pt is safe to return home with family at discharge. Encouraged supervision when ambulating. Also encouraged use of single point cane vs RW for added safety after dc and especially on dialysis days. He would benefit from OP PT for LE conditioning and balance. Pt will benefit from PT services to address deficits in strength, balance, and mobility in order to return to full function at home.      Follow Up Recommendations Outpatient PT;Other (comment) (For LE strengthening and balance)    Equipment Recommendations  None recommended by PT;Other  (comment) (encouraged use of spc vs RW after dc and on dialysis days)    Recommendations for Other Services       Precautions / Restrictions Precautions Precautions: None Restrictions Weight Bearing Restrictions: No      Mobility  Bed Mobility Overal bed mobility: Modified Independent             General bed mobility comments: Pt moves slowly but is able to perform bed mobility with use of bed rails but no external assist from therapist.   Transfers Overall transfer level: Needs assistance Equipment used: None Transfers: Sit to/from Stand Sit to Stand: Min guard         General transfer comment: Pt moves slowly but doesn't require external assist or support from therapist. Steady in standing without UE support  Ambulation/Gait Ambulation/Gait assistance: Min guard Gait Distance (Feet): 80 Feet Assistive device: None       General Gait Details: Pt is able to ambulate from room partially around RN station and back to bed. He demonstrates some occasional cross-over stepping or staggers but is able to self-correct without external assist from therapist. Gait speed is slow but consistent. SpO2 remains at or greater than 95% on room air during ambulation  Stairs            Wheelchair Mobility    Modified Rankin (Stroke Patients Only)       Balance Overall balance assessment: Needs assistance Sitting-balance support: No upper extremity supported Sitting balance-Leahy Scale: Good     Standing balance support: No upper extremity supported Standing balance-Leahy Scale: Fair Standing balance comment: Negative romberg. Single leg balance is approximately 3-4s on each LE. No history  of falls in the last 12 months per patient and family                             Pertinent Vitals/Pain Pain Assessment: No/denies pain    Home Living Family/patient expects to be discharged to:: Private residence Living Arrangements: Children;Spouse/significant  other Available Help at Discharge: Family;Available 24 hours/day Type of Home: House Home Access: Stairs to enter Entrance Stairs-Rails: Can reach both Entrance Stairs-Number of Steps: 1 Home Layout: Two level;Able to live on main level with bedroom/bathroom Home Equipment: Gilford Rile - 2 wheels;Shower seat - built in (Home O2 for prn use)      Prior Function Level of Independence: Needs assistance   Gait / Transfers Assistance Needed: Ambualtes around the hosue without assistive device. No falls in the last 12 months     Comments: Uses 0.5L O2 at baseline on HD days prn     Hand Dominance   Dominant Hand: Right    Extremity/Trunk Assessment   Upper Extremity Assessment Upper Extremity Assessment: Overall WFL for tasks assessed;Defer to OT evaluation    Lower Extremity Assessment Lower Extremity Assessment: Overall WFL for tasks assessed       Communication   Communication: No difficulties (soft spoken )  Cognition Arousal/Alertness: Awake/alert (Fatigued following HD ) Behavior During Therapy: WFL for tasks assessed/performed Overall Cognitive Status: Within Functional Limits for tasks assessed                                        General Comments General comments (skin integrity, edema, etc.): SpO2 95% on 0.5L [place]     Exercises Other Exercises Other Exercises: Pt and daughter educated re: OT role, DME recs, d/c recs, ECS, IS (frequency and use), importance of mobility for functional strengthening Other Exercises: Meal setup, IS, simulated UBD   Assessment/Plan    PT Assessment Patient needs continued PT services  PT Problem List Decreased strength;Decreased activity tolerance;Decreased balance       PT Treatment Interventions DME instruction;Gait training;Functional mobility training;Therapeutic activities;Therapeutic exercise;Balance training;Neuromuscular re-education    PT Goals (Current goals can be found in the Care Plan section)  Acute  Rehab PT Goals Patient Stated Goal: To return home  PT Goal Formulation: With patient/family Time For Goal Achievement: 12/07/19 Potential to Achieve Goals: Good    Frequency Min 2X/week   Barriers to discharge        Co-evaluation               AM-PAC PT "6 Clicks" Mobility  Outcome Measure Help needed turning from your back to your side while in a flat bed without using bedrails?: None Help needed moving from lying on your back to sitting on the side of a flat bed without using bedrails?: None Help needed moving to and from a bed to a chair (including a wheelchair)?: None Help needed standing up from a chair using your arms (e.g., wheelchair or bedside chair)?: None Help needed to walk in hospital room?: A Little Help needed climbing 3-5 steps with a railing? : A Little 6 Click Score: 22    End of Session Equipment Utilized During Treatment: Gait belt Activity Tolerance: Patient tolerated treatment well Patient left: in bed;with call bell/phone within reach;with family/visitor present Nurse Communication: Other (comment) (Message sent to MD regarding DC recommendation) PT Visit Diagnosis: Unsteadiness on feet (  R26.81);Muscle weakness (generalized) (M62.81)    Time: 6384-6659 PT Time Calculation (min) (ACUTE ONLY): 20 min   Charges:   PT Evaluation $PT Eval Moderate Complexity: 1 Mod          Keagan Oconnor D Victoriano Campion PT, DPT, GCS   Reyna Lorenzi 11/23/2019, 3:37 PM

## 2019-11-23 NOTE — Discharge Summary (Addendum)
Physician Discharge Summary  Scott Oconnor VQQ:[id number] DOB: 1946-03-27 DOA: 11/18/2019  PCP: [name], MD  Admit date: 11/18/2019   Discharge date: 11/23/2019  Admitted From: Home  Discharge disposition:  Home.    Recommendations for Outpatient Follow-Up:    Follow-up with your primary care provider in 1 week for general checkup and blood work.    Please perform CBC, BMP, LFT, magnesium, phosphorus in the next visit. . Follow-up with cardiology Dr. [name] in 1 to 2 weeks to discuss about placing a monitor as outpatient and cardiology follow up. . Patient was diagnosed of having incidental line associated thrombus.  Has been started on Eliquis and the plan is to continue for 3 months.  Discharge Diagnosis:   Active Problems:   Acute pulmonary edema (HCC)   HFrEF (heart failure with reduced ejection fraction) [facility]) Hospital induced delirium Cerebral infarction Acute hypoxic respiratory failure Acute systolic congestive heart failure Internal jugular thrombus End-stage renal disease on hemodialysis Anemia of chronic disease Severe protein calorie malnutrition  Discharge Condition: Improved.  Diet recommendation: Low sodium, heart healthy.     Wound care: None.  Code status: Full.  History of Present Illness:   74 years old male with history of critical aortic atherosclerosis requiring endograft who has since suffered from kidney injury requiring dialysis in early May. The patient was seemingly well until the evening of presentation when he started noting acute shortness of breath which was progressive in nature.  Patient had missed dialysis for 1 day.  Patient was then brought into the hospital and was saturating 70% on room air and was initially hypertensive with systolic blood pressure in the 200s.   He was placed on CPAP with some improvement but his blood pressure remained very elevated in excess of 200. On arrival to the ED, he had increased work of  breathing so he was switched to BiPAP from CPAP. His blood pressure was 230, and nitroglycerin drip was started.  CXR revealed bilateral infiltrates and effusions. His dry weight was 53.5kg by the record, and he apparently was 58.9kg on admittance to the ICU.  Patient improved some and was then subsequently transferred out of ICU.  Hospital Course:   Following conditions were addressed during hospitalization as listed below,  Hospital induced delirium, metabolic encephalopathy.  improved at this time.    Cerebral infarcts.  MRI of the brain showed areas of possible small acute to subacute infarcts of the left centrum semiovale, right thalamus and right cerebellum. Neurology on board. Currently on dual antiplatelets and statins. Recommended aspirin and statins and outpatient monitor for atrial fibrillation.  Carotid duplex ultrasound was recommended which showed incidental finding of DVT in the right internal jugular vein with less than 49% stenosis of the internal carotid arteries.  Communicated with cardiology, neurology vascular surgery and the plan at this time is to start on Eliquis and aspirin.  Communicated with the family about it.  Family was advised to seek medical attention for worsening symptoms including bleeding.   Acute hypoxic and hypercapnic respiratory failure secondary to hypertensive emergency and pulmonary edema.    Improved after hemodialysis.    Acute systolic congestive heart failure.  Patient received BiPAP at admission.  Continue hemodialysis.  2D echocardiogram from 11/11/2019 with LV ejection fraction of 30 to 35% with grade 1 diastolic dysfunction.    Patient initially required nitroglycerin/ nicardipine drip in the ICU.  Currently on Coreg, ARB and hydralazine.  Patient received hemodialysis and IV diuretics improvement in his symptoms.Marland Kitchen  Underwent myocardial perfusion scan on 11/22/2019 with no findings of ischemia.  Cardiology recommend outpatient follow-up after discharge.     Internal jugular thrombus detected during carotid duplex ultrasound.  Incidental finding.  Seen by cardiology and vascular surgery.  On discussion with cardiology, neurology, nephrology, vascular surgery today the plan is to continue aspirin low-dose, and add Eliquisx 3 months for provoked DVT.   End-stage renal disease on hemodialysis.     Received hemodialysis during hospitalization.  Continue hemodialysis for fluid management as outpatient..    mild leukocytosis.  Reactive in nature. Has normalized at this time.   Essential hypertension with hypertensive emergency on presentation.  Continue ARB, hydralazine Coreg and torsemide on discharge.  Dose of Coreg has been increased. Blood pressure has overall improved   Anemia secondary to chronic kidney disease.   No evidence of bleeding. Latest hemoglobin of 10.6.  Will need outpatient follow-up.    Debility, deconditioning.  PT evaluation appreciated.  Patient performed well.  Advised supervision walker/cane at home. Will add outpatient referral for physical therapy.   Severe protein calorie malnutrition. Present on admission. Dietitian on board. Continue nutritional supplements.  Disposition.  At this time, patient is stable for disposition home.  Spoke with the patient daughter at length prior to discharge.  I also communicated with consultants prior to discharge as well.  Medical Consultants:    Cardiology  Neurology  Nephrology  Vascular surgery  Procedures:    Hemodialysis BiPAP Stress test  Subjective:   Today, patient feels better.  More alert awake and communicative.  No mention of nausea vomiting fever chills or increasing shortness of breath.  Complains of mild leg pain.    Discharge Exam:   Vitals:   11/23/19 1200 11/23/19 1340  BP: 105/74 117/62  Pulse: 78 70  Resp: 18   Temp:  97.8 F (36.6 C)  SpO2:  100%   Vitals:   11/23/19 1130 11/23/19 1145 11/23/19 1200 11/23/19 1340  BP: 121/68 100/74 105/74  117/62  Pulse: 78 80 78 70  Resp: (!) 21 (!) 21 18   Temp:    97.8 F (36.6 C)  TempSrc:    Oral  SpO2:    100%  Weight:      Height:        General: Alert awake, not in obvious distress, thinly built, elderly male. HENT: pupils equally reacting to light,  No scleral pallor or icterus noted. Oral mucosa is moist.  Chest:  .  Diminished breath sounds bilaterally.  Right internal jugular permacath in place. CVS: S1 &S2 heard.  Systolic murmur noted.  Regular rate and rhythm. Abdomen: Soft, nontender, nondistended.  Bowel sounds are heard.   Extremities: No cyanosis, clubbing or edema.  Peripheral pulses are palpable. Psych: Alert, awake and communicative. CNS:  No cranial nerve deficits.  Moving all extremities. Skin: Warm and dry.  No rashes noted.  The results of significant diagnostics from this hospitalization (including imaging, microbiology, ancillary and laboratory) are listed below for reference.     Diagnostic Studies:   CT HEAD WO CONTRAST  Result Date: 11/19/2019 CLINICAL DATA:  Ataxia, altered level of consciousness. EXAM: CT HEAD WITHOUT CONTRAST TECHNIQUE: Contiguous axial images were obtained from the base of the skull through the vertex without intravenous contrast. COMPARISON:  None. FINDINGS: Brain: Mild chronic ischemic white matter disease is noted. No mass effect or midline shift is noted. Ventricular size is within normal limits. There is no evidence of mass lesion, hemorrhage or acute infarction.  Vascular: No hyperdense vessel or unexpected calcification. Skull: Normal. Negative for fracture or focal lesion. Sinuses/Orbits: No acute finding. Other: None. Electronically Signed   By: Marijo Conception M.D.   On: 11/19/2019 08:31   DG Chest Port 1 View  Result Date: 11/20/2019 CLINICAL DATA:  Acute on chronic respiratory failure. EXAM: PORTABLE CHEST 1 VIEW COMPARISON:  11/18/2019 FINDINGS: The right IJ dialysis catheter is stable. The cardiac silhouette, mediastinal  and hilar contours are unchanged. Stable tortuosity and calcification of the thoracic aorta. Persistent diffuse interstitial and airspace process in the lungs and persistent pleural effusions and bibasilar atelectasis. IMPRESSION: 1. Stable support apparatus. 2. Persistent diffuse interstitial and airspace process and persistent pleural effusions and bibasilar atelectasis. Electronically Signed   By: Marijo Sanes M.D.   On: 11/20/2019 05:58   ECHOCARDIOGRAM COMPLETE  Result Date: 11/19/2019    ECHOCARDIOGRAM REPORT   Patient Name:   JAMICAH ANSTEAD Date of Exam: 11/19/2019 Medical Rec #:  [id number]         Height:       66.0 in Accession #:    [id number]        Weight:       129.9 lb Date of Birth:  05/19/1946         BSA:          1.664 m Patient Age:    50 years          BP:           146/70 mmHg Patient Gender: M                 HR:           59 bpm. Exam Location:  [facility] Procedure: 2D Echo, Color Doppler and Cardiac Doppler Indications:     CHF- acute diastolic 644.03  History:         Patient has prior history of Echocardiogram examinations, most                  recent 09/25/2019. Risk Factors:Hypertension and Dyslipidemia.                  PVD.  Sonographer:     [name] RDCS (AE) Referring Phys:  [id number] DONALD BRESCIA Diagnosing Phys: Nelva Bush MD IMPRESSIONS  1. Left ventricular ejection fraction, by estimation, is 30 to 35%. The left ventricle has moderately decreased function. The left ventricle demonstrates global hypokinesis. There is mild left ventricular hypertrophy. Left ventricular diastolic parameters are consistent with Grade I diastolic dysfunction (impaired relaxation). Elevated left atrial pressure.  2. Right ventricular systolic function is normal. The right ventricular size is normal. There is moderately elevated pulmonary artery systolic pressure.  3. Moderate pleural effusion in both left and right lateral regions.  4. The mitral valve is normal in structure. Mild mitral valve  regurgitation.  5. The aortic valve is tricuspid. Aortic valve regurgitation is trivial. No aortic stenosis is present.  6. The inferior vena cava is normal in size with <50% respiratory variability, suggesting right atrial pressure of 8 mmHg. Comparison(s): Prior images reviewed side by side. The left ventricular function is worsened. FINDINGS  Left Ventricle: Left ventricular ejection fraction, by estimation, is 30 to 35%. The left ventricle has moderately decreased function. The left ventricle demonstrates global hypokinesis. The left ventricular internal cavity size was normal in size. There is mild left ventricular hypertrophy. Left ventricular diastolic parameters are consistent with Grade I diastolic dysfunction (impaired relaxation). Elevated left  atrial pressure. Right Ventricle: The right ventricular size is normal. No increase in right ventricular wall thickness. Right ventricular systolic function is normal. There is moderately elevated pulmonary artery systolic pressure. The tricuspid regurgitant velocity is 3.52 m/s, and with an assumed right atrial pressure of 8 mmHg, the estimated right ventricular systolic pressure is 12.4 mmHg. Left Atrium: Left atrial size was normal in size. Right Atrium: Right atrial size was normal in size. Pericardium: Trivial pericardial effusion is present. Mitral Valve: The mitral valve is normal in structure. Mild mitral annular calcification. Mild mitral valve regurgitation. Tricuspid Valve: The tricuspid valve is grossly normal. Tricuspid valve regurgitation is mild. Aortic Valve: The aortic valve is tricuspid. Aortic valve regurgitation is trivial. No aortic stenosis is present. Aortic valve mean gradient measures 2.0 mmHg. Aortic valve peak gradient measures 3.8 mmHg. Aortic valve area, by VTI measures 1.68 cm. Pulmonic Valve: The pulmonic valve was not well visualized. Pulmonic valve regurgitation is mild. No evidence of pulmonic stenosis. Aorta: The aortic root is  normal in size and structure. Pulmonary Artery: The pulmonary artery is not well seen. Venous: The inferior vena cava is normal in size with less than 50% respiratory variability, suggesting right atrial pressure of 8 mmHg. IAS/Shunts: No atrial level shunt detected by color flow Doppler. Additional Comments: There is a moderate pleural effusion in both left and right lateral regions.  LEFT VENTRICLE PLAX 2D LVIDd:         4.72 cm  Diastology LVIDs:         3.33 cm  LV e' lateral:   4.68 cm/s LV PW:         1.18 cm  LV E/e' lateral: 11.9 LV IVS:        0.92 cm  LV e' medial:    2.72 cm/s LVOT diam:     2.00 cm  LV E/e' medial:  20.5 LV SV:         34 LV SV Index:   20 LVOT Area:     3.14 cm  RIGHT VENTRICLE RV Basal diam:  2.66 cm RV S prime:     14.10 cm/s TAPSE (M-mode): 3.5 cm LEFT ATRIUM           Index       RIGHT ATRIUM           Index LA diam:      3.60 cm 2.16 cm/m  RA Area:     13.10 cm LA Vol (A2C): 48.2 ml 28.96 ml/m RA Volume:   34.40 ml  20.67 ml/m LA Vol (A4C): 26.9 ml 16.16 ml/m  AORTIC VALVE                   PULMONIC VALVE AV Area (Vmax):    1.67 cm    PV Vmax:       0.69 m/s AV Area (Vmean):   1.58 cm    PV Peak grad:  1.9 mmHg AV Area (VTI):     1.68 cm AV Vmax:           97.17 cm/s AV Vmean:          68.200 cm/s AV VTI:            0.202 m AV Peak Grad:      3.8 mmHg AV Mean Grad:      2.0 mmHg LVOT Vmax:         51.60 cm/s LVOT Vmean:        34.400 cm/s  LVOT VTI:          0.108 m LVOT/AV VTI ratio: 0.53  AORTA Ao Root diam: 3.00 cm MITRAL VALVE               TRICUSPID VALVE MV Area (PHT): 3.65 cm    TR Peak grad:   49.6 mmHg MV Decel Time: 208 msec    TR Vmax:        352.00 cm/s MV E velocity: 55.70 cm/s MV A velocity: 95.10 cm/s  SHUNTS MV E/A ratio:  0.59        Systemic VTI:  0.11 m                            Systemic Diam: 2.00 cm Nelva Bush MD Electronically signed by Nelva Bush MD Signature Date/Time: 11/19/2019/2:00:40 PM    Final      Labs:   Basic Metabolic  Panel: Recent Labs  Lab 11/18/19 2118 11/19/19 0129 11/19/19 0407 11/19/19 0407 11/20/19 0424 11/20/19 0424 11/21/19 0730 11/21/19 0730 11/22/19 0603 11/23/19 0648  NA   < >  --  140  --  139  --  141  --  141 138  138  K   < >  --  4.3   < > 4.0   < > 4.6   < > 3.7 3.6  3.6  CL   < >  --  100  --  100  --  100  --  99 97*  97*  CO2   < >  --  26  --  26  --  26  --  26 24  24   GLUCOSE   < >  --  155*  --  123*  --  111*  --  122* 136*  137*  BUN   < >  --  37*  --  54*  --  39*  --  42* 62*  61*  CREATININE   < >  --  4.97*  --  6.97*  --  5.54*  --  5.07* 7.55*  7.30*  CALCIUM   < >  --  9.1  --  8.6*  --  9.4  --  9.0 8.8*  8.8*  MG  --  1.9  --   --  2.0  --  1.9  --  2.1 2.1  PHOS  --  4.2  --   --   --   --   --   --  8.6* 8.2*  8.2*   < > = values in this interval not displayed.   GFR Estimated Creatinine Clearance: 5.7 mL/min (A) (by C-G formula based on SCr of 7.3 mg/dL (H)). Liver Function Tests: Recent Labs  Lab 11/18/19 2118 11/20/19 0424 11/23/19 0648  AST 43* 38  --   ALT 59* 69*  --   ALKPHOS 97 92  --   BILITOT 0.7 0.7  --   PROT 7.5 6.5  --   ALBUMIN 3.5 3.2* 3.4*   No results for input(s): LIPASE, AMYLASE in the last 168 hours. No results for input(s): AMMONIA in the last 168 hours. Coagulation profile Recent Labs  Lab 11/22/19 1320  INR 1.1    CBC: Recent Labs  Lab 11/18/19 2118 11/20/19 0424 11/21/19 0730 11/22/19 0603 11/23/19 0648  WBC 15.5* 9.0 9.0 9.5 10.2  NEUTROABS 6.2 4.8 4.8 4.9 6.2  HGB 11.0* 9.7* 10.8* 10.5* 10.6*  HCT  34.2* 28.6* 32.0* 32.0* 32.0*  MCV 102.4* 96.3 95.8 99.4 99.1  PLT 144* 132* 163 164 161   Cardiac Enzymes: No results for input(s): CKTOTAL, CKMB, CKMBINDEX, TROPONINI in the last 168 hours. BNP: Invalid input(s): POCBNP CBG: Recent Labs  Lab 11/18/19 2353 11/20/19 1442  GLUCAP 153* 90   D-Dimer No results for input(s): DDIMER in the last 72 hours. Hgb A1c No results for input(s):  HGBA1C in the last 72 hours. Lipid Profile No results for input(s): CHOL, HDL, LDLCALC, TRIG, CHOLHDL, LDLDIRECT in the last 72 hours. Thyroid function studies No results for input(s): TSH, T4TOTAL, T3FREE, THYROIDAB in the last 72 hours.  Invalid input(s): FREET3 Anemia work up No results for input(s): VITAMINB12, FOLATE, FERRITIN, TIBC, IRON, RETICCTPCT in the last 72 hours. Microbiology Recent Results (from the past 240 hour(s))  SARS Coronavirus 2 by RT PCR (hospital order, performed in [facility] hospital lab) Nasopharyngeal Nasopharyngeal Swab     Status: None   Collection Time: 11/18/19 10:59 PM   Specimen: Nasopharyngeal Swab  Result Value Ref Range Status   SARS Coronavirus 2 NEGATIVE NEGATIVE Final    Comment: (NOTE) SARS-CoV-2 target nucleic acids are NOT DETECTED.  The SARS-CoV-2 RNA is generally detectable in upper and lower respiratory specimens during the acute phase of infection. The lowest concentration of SARS-CoV-2 viral copies this assay can detect is 250 copies / mL. A negative result does not preclude SARS-CoV-2 infection and should not be used as the sole basis for treatment or other patient management decisions.  A negative result may occur with improper specimen collection / handling, submission of specimen other than nasopharyngeal swab, presence of viral mutation(s) within the areas targeted by this assay, and inadequate number of viral copies (<250 copies / mL). A negative result must be combined with clinical observations, patient history, and epidemiological information.  Fact Sheet for Patients:   StrictlyIdeas.no  Fact Sheet for Healthcare Providers: BankingDealers.co.za  This test is not yet approved or  cleared by the [place] FDA and has been authorized for detection and/or diagnosis of SARS-CoV-2 by FDA under an Emergency Use Authorization (EUA).  This EUA will remain in effect (meaning  this test can be used) for the duration of the COVID-19 declaration under Section 564(b)(1) of the Act, 21 U.S.C. section 360bbb-3(b)(1), unless the authorization is terminated or revoked sooner.  Performed at [facility], [address] [place], [place] [address]   MRSA PCR Screening     Status: None   Collection Time: 11/19/19 12:00 AM   Specimen: Nasopharyngeal  Result Value Ref Range Status   MRSA by PCR NEGATIVE NEGATIVE Final    Comment:        The GeneXpert MRSA Assay (FDA approved for NASAL specimens only), is one component of a comprehensive MRSA colonization surveillance program. It is not intended to diagnose MRSA infection nor to guide or monitor treatment for MRSA infections. Performed at [facility], [address] [place], [place] [address]      Discharge Instructions:   Discharge Instructions     Call MD for:   Complete by: As directed    Worsening shortness of breath, difficulty breathing, worsening mental capacity, any bleeding   Diet - low sodium heart healthy   Complete by: As directed    Fluid restriction 1500 mL/day.   Discharge instructions   Complete by: As directed    Follow-up with your primary care provider in 1 week for general checkup and blood work.  Follow-up with cardiology Dr. [name] in 1 to 2 weeks to discuss about placing a monitor as outpatient and cardiology followup.  Continue aspirin and Eliquis for blood thinner.  If you experience any bleeding, please do not take Eliquis and seek medical attention immediately.  Continue hemodialysis as per your schedule.  Please note that your dose of diuretic has been increased.   Increase activity slowly   Complete by: As directed       Allergies as of 11/23/2019       Reactions   Ivp Dye [iodinated Diagnostic Agents]    ESRD patient         Medication List     STOP taking these medications    amLODipine 10 MG tablet Commonly known as: NORVASC   amLODipine  5 MG tablet Commonly known as: NORVASC   benzonatate 100 MG capsule Commonly known as: TESSALON   labetalol 100 MG tablet Commonly known as: NORMODYNE   megestrol 40 MG/ML suspension Commonly known as: MEGACE   nicotine 21 mg/24hr patch Commonly known as: NICODERM CQ - dosed in mg/24 hours       TAKE these medications    acetaminophen 500 MG tablet Commonly known as: TYLENOL Take 500 mg by mouth every 8 (eight) hours as needed for moderate pain.   albuterol 108 (90 Base) MCG/ACT inhaler Commonly known as: VENTOLIN HFA Inhale 2 puffs into the lungs every 6 (six) hours as needed for wheezing or shortness of breath.   Apixaban Starter Pack (10mg  and 5mg ) Commonly known as: ELIQUIS STARTER PACK Take as directed on package: start with two-5mg  tablets twice daily for 7 days. On day 8, switch to one-5mg  tablet twice daily.   aspirin EC 81 MG tablet Take 81 mg by mouth at bedtime.   atorvastatin 40 MG tablet Commonly known as: LIPITOR Take 1 tablet (40 mg total) by mouth daily. Start taking on: November 24, 2019   carvedilol 6.25 MG tablet Commonly known as: COREG Take 1 tablet (6.25 mg total) by mouth 2 (two) times daily with a meal.   docusate sodium 100 MG capsule Commonly known as: COLACE Take 1 capsule (100 mg total) by mouth 2 (two) times daily as needed for mild constipation.   feeding supplement (NEPRO CARB STEADY) Liqd Take 237 mLs by mouth 2 (two) times daily at 8 am and 10 pm.   gabapentin 100 MG capsule Commonly known as: NEURONTIN Take 100 mg by mouth at bedtime.   hydrALAZINE 25 MG tablet Commonly known as: APRESOLINE Take 1 tablet (25 mg total) by mouth every 8 (eight) hours as needed (for BP >170). What changed:   how much to take  when to take this   irbesartan 150 MG tablet Commonly known as: AVAPRO Take 150 mg by mouth every evening.   Mucinex 600 MG 12 hr tablet Generic drug: guaiFENesin Take 1 tablet (600 mg total) by mouth 2 (two) times  daily.   multivitamin Tabs tablet Take 1 tablet by mouth at bedtime.   torsemide 20 MG tablet Commonly known as: DEMADEX Take 2 tablets (40 mg total) by mouth daily. What changed: how much to take        Follow-up Information     Gollan, Kathlene November, MD. Schedule an appointment as soon as possible for a visit in 1 week(s).   Specialty: Cardiology Why: for ambulatory monitor. Contact information: [place] [address] [phone]         Tressia Miners,  Hart Rochester, MD. Schedule an appointment as soon as possible for a visit in 1 week(s).   Specialty: Internal Medicine Why: for regular follow up and blood work  Contact information: [place] [address] [phone]                 Time coordinating discharge: 39 minutes  Signed:  Ezrie Bunyan  [facility] 11/23/2019, 3:01 PM

## 2019-12-02 ENCOUNTER — Encounter: Payer: Self-pay | Admitting: Family

## 2019-12-02 ENCOUNTER — Ambulatory Visit (INDEPENDENT_AMBULATORY_CARE_PROVIDER_SITE_OTHER): Payer: Medicare Other | Admitting: Family

## 2019-12-02 ENCOUNTER — Other Ambulatory Visit: Payer: Self-pay

## 2019-12-02 VITALS — BP 124/70 | HR 64 | Ht 70.5 in | Wt 110.2 lb

## 2019-12-02 DIAGNOSIS — J90 Pleural effusion, not elsewhere classified: Secondary | ICD-10-CM

## 2019-12-02 DIAGNOSIS — R7989 Other specified abnormal findings of blood chemistry: Secondary | ICD-10-CM

## 2019-12-02 DIAGNOSIS — F17200 Nicotine dependence, unspecified, uncomplicated: Secondary | ICD-10-CM | POA: Diagnosis not present

## 2019-12-02 DIAGNOSIS — Z720 Tobacco use: Secondary | ICD-10-CM

## 2019-12-02 DIAGNOSIS — I10 Essential (primary) hypertension: Secondary | ICD-10-CM

## 2019-12-02 DIAGNOSIS — D631 Anemia in chronic kidney disease: Secondary | ICD-10-CM

## 2019-12-02 DIAGNOSIS — N185 Chronic kidney disease, stage 5: Secondary | ICD-10-CM

## 2019-12-02 DIAGNOSIS — I714 Abdominal aortic aneurysm, without rupture, unspecified: Secondary | ICD-10-CM

## 2019-12-02 DIAGNOSIS — I8290 Acute embolism and thrombosis of unspecified vein: Secondary | ICD-10-CM

## 2019-12-02 DIAGNOSIS — I5020 Unspecified systolic (congestive) heart failure: Secondary | ICD-10-CM | POA: Diagnosis not present

## 2019-12-02 NOTE — Patient Instructions (Addendum)
Medication Instructions:  Your physician has recommended you make the following change in your medication:   START Eliquis 2.5mg  twice daily. Samples have been provided today.  STOP Aspirin  Please complete the Eliquis patient assistance application provided today. You can attached the required documentation and bring it back to our office.  *If you need a refill on your cardiac medications before your next appointment, please call your pharmacy*   Lab Work: No lab work today.  If you have labs (blood work) drawn today and your tests are completely normal, you will receive your results only by: Marland Kitchen MyChart Message (if you have MyChart) OR . A paper copy in the mail If you have any lab test that is abnormal or we need to change your treatment, we will call you to review the results.   Testing/Procedures: Please wear ZIO monitor for 2 weeks. After 2 weeks you will receive a second monitor in the mail.   Your physician has requested that you have an echocardiogram in 8 weeks. Echocardiography is a painless test that uses sound waves to create images of your heart. It provides your doctor with information about the size and shape of your heart and how well your heart's chambers and valves are working. This procedure takes approximately one hour. There are no restrictions for this procedure.  Follow-Up: At [facility], you and your health needs are our priority.  As part of our continuing mission to provide you with exceptional heart care, we have created designated Provider Care Teams.  These Care Teams include your primary Cardiologist (physician) and Advanced Practice Providers (APPs -  Physician Assistants and Nurse Practitioners) who all work together to provide you with the care you need, when you need it.  We recommend signing up for the patient portal called "MyChart".  Sign up information is provided on this After Visit Summary.  MyChart is used to connect with patients for Virtual  Visits (Telemedicine).  Patients are able to view lab/test results, encounter notes, upcoming appointments, etc.  Non-urgent messages can be sent to your provider as well.   To learn more about what you can do with MyChart, go to [email or web address].    Your next appointment:   2 month(s)  The format for your next appointment:   In Person  Provider:    You may see Ida Rogue, MD or one of the following Advanced Practice Providers on your designated Care Team:    Murray Hodgkins, NP  Christell Faith, PA-C  Marrianne Mood, PA-C   Other Instructions

## 2019-12-02 NOTE — Progress Notes (Signed)
Office Visit    Patient Name: Scott Oconnor Date of Encounter: 12/02/2019  Primary Care Provider:  [name], MD Primary Cardiologist:  Ida Rogue, MD Electrophysiologist:  None   Chief Complaint    Scott Oconnor is a 74 y.o. 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 Hyperlipidemia   . Hypertension   . PVD (peripheral vascular disease) ([place])   . Reynolds syndrome [facility])    Past Surgical History:  Procedure Laterality Date  . DIALYSIS/PERMA CATHETER INSERTION N/A 09/26/2019   Procedure: DIALYSIS/PERMA CATHETER INSERTION;  Surgeon: Algernon Huxley, MD;  Location: [place] [facility];  Service: Cardiovascular;  Laterality: N/A;  . ENDOVASCULAR REPAIR/STENT GRAFT N/A 08/14/2019   Procedure: ENDOVASCULAR REPAIR/STENT GRAFT;  Surgeon: Katha Cabal, MD;  Location: [place] [facility];  Service: Cardiovascular;  Laterality: N/A;    Allergies  Allergies  Allergen Reactions  . Ivp Dye [Iodinated Diagnostic Agents]     ESRD patient     History of Present Illness    Scott Oconnor is a 74 y.o. 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  He was admitted 11/18/19  to [facility] with shortness of breath, malaise, palpitations.  Echocardiogram 11/11/2019 showed very newly reduced LV systolic function with EF 30 to 35%, global hypokinesis, grade 1 diastolic dysfunction, elevated left atrial pressure, RV normal size and function, moderately elevated PASP, moderate bilateral pleural effusion, mild MR, trivial AI.  He underwent pharmacologic myocardial perfusion stress test 11/22/2019 with low to moderate risk scan without evidence of ischemia or scar in LVEF 44%.  Coronary artery calcification, aortic  atherosclerosis, right pleural effusion noted on attenuation correction CT.  Given nonischemic myocardial perfusion stress test and desire to avoid IV contrast, catheterization was deferred.  He was also noted to have stroke with multi focal infarcts concerning for embolic phenomenon.  Carotid duplex ordered with incidental finding of DVT of the right internal jugular vein with less than 49% stenosis bilaterally.  Telemetry was without evidence of atrial fibrillation.  He was started on Eliquis for 3 months.  Presents today with his daughter.  He has been able to wean off of oxygen since hospital discharge.  He is in the process of getting a peritoneal catheter so he can do home PD.  He had HD on Tuesday and will continue 3 times per week per nephrology.  They are following his labs at each dialysis visit.  He is compliant with a low-sodium diet and fluid restriction.  He was unable to afford the Eliquis as it was $1800 per month as he does not have a Medicare part D supplement.  He has been taking aspirin at home.  Reports fatigue and dyspnea on exertion. Denies palpitations, lightheadedness, dizziness, near syncope.   EKGs/Labs/Other Studies Reviewed:   The following studies were reviewed today:  2D echo 09/25/2019: 1. Left ventricular ejection fraction, by estimation, is 60 to 65%. The  left ventricle has normal function. The left ventricle has no regional  wall motion abnormalities. There is mild left ventricular hypertrophy.  Left ventricular diastolic parameters  are consistent with Grade II diastolic dysfunction (pseudonormalization).   2. Right ventricular systolic function is normal. The right ventricular  size is normal. There is mildly elevated  pulmonary artery systolic  pressure. The estimated right ventricular systolic pressure is 11.9 mmHg.   3. Mild to moderate mitral valve regurgitation.   4. Tricuspid valve regurgitation is moderate. __________   2D echo 11/19/2019: 1. Left  ventricular ejection fraction, by estimation, is 30 to 35%. The  left ventricle has moderately decreased function. The left ventricle  demonstrates global hypokinesis. There is mild left ventricular  hypertrophy. Left ventricular diastolic  parameters are consistent with Grade I diastolic dysfunction (impaired  relaxation). Elevated left atrial pressure.   2. Right ventricular systolic function is normal. The right ventricular  size is normal. There is moderately elevated pulmonary artery systolic  pressure.   3. Moderate pleural effusion in both left and right lateral regions.   4. The mitral valve is normal in structure. Mild mitral valve  regurgitation.   5. The aortic valve is tricuspid. Aortic valve regurgitation is trivial.  No aortic stenosis is present.   6. The inferior vena cava is normal in size with <50% respiratory  variability, suggesting right atrial pressure of 8 mmHg.  TTE (11/19/19):  1. Left ventricular ejection fraction, by estimation, is 30 to 35%. The  left ventricle has moderately decreased function. The left ventricle  demonstrates global hypokinesis. There is mild left ventricular  hypertrophy. Left ventricular diastolic  parameters are consistent with Grade I diastolic dysfunction (impaired  relaxation). Elevated left atrial pressure.   2. Right ventricular systolic function is normal. The right ventricular  size is normal. There is moderately elevated pulmonary artery systolic  pressure.   3. Moderate pleural effusion in both left and right lateral regions.   4. The mitral valve is normal in structure. Mild mitral valve  regurgitation.   5. The aortic valve is tricuspid. Aortic valve regurgitation is trivial.  No aortic stenosis is present.   6. The inferior vena cava is normal in size with <50% respiratory  variability, suggesting right atrial pressure of 8 mmHg.    Pharmacologic myocardial perfusion stress test (11/22/19): Low to moderate risk scan  without evidence of ischemia or significant scar.  LVEF 44%.  Coronary artery calcification, aortic atherosclerosis, and right pleural effusion noted on attenuation correction CT.  EKG:  EKG is ordered today.  The ekg ordered today demonstrates NSR 64 bpm with PAC, stable TWI V5/V6, poor R wave progression. No acute ST/T wave changes.   Recent Labs: 09/19/2019: TSH CANCELED 11/18/2019: B Natriuretic Peptide >4,500.0 11/20/2019: ALT 69 11/23/2019: BUN 61; BUN 62; Creatinine, Ser 7.30; Creatinine, Ser 7.55; Hemoglobin 10.6; Magnesium 2.1; Platelets 161; Potassium 3.6; Potassium 3.6; Sodium 138; Sodium 138  Recent Lipid Panel    Component Value Date/Time   CHOL 163 11/20/2019 0424   TRIG 126 11/20/2019 0424   HDL 48 11/20/2019 0424   CHOLHDL 3.4 11/20/2019 0424   VLDL 25 11/20/2019 0424   LDLCALC 90 11/20/2019 0424   LDLCALC 74 06/19/2018 0941    Home Medications   Current Meds  Medication Sig  . acetaminophen (TYLENOL) 500 MG tablet Take 500 mg by mouth every 8 (eight) hours as needed for moderate pain.   Marland Kitchen albuterol (VENTOLIN HFA) 108 (90 Base) MCG/ACT inhaler Inhale 2 puffs into the lungs every 6 (six) hours as needed for wheezing or shortness of breath.  . APIXABAN (ELIQUIS) VTE STARTER PACK (10MG  AND 5MG ) Take as directed on package: start with two-5mg  tablets twice daily for 7 days. On day 8, switch to one-5mg  tablet twice daily.  Marland Kitchen atorvastatin (LIPITOR) 40 MG tablet  Take 1 tablet (40 mg total) by mouth daily.  . carvedilol (COREG) 6.25 MG tablet Take 1 tablet (6.25 mg total) by mouth 2 (two) times daily with a meal.  . docusate sodium (COLACE) 100 MG capsule Take 1 capsule (100 mg total) by mouth 2 (two) times daily as needed for mild constipation.  . gabapentin (NEURONTIN) 100 MG capsule Take 100 mg by mouth at bedtime.  Marland Kitchen guaiFENesin (MUCINEX) 600 MG 12 hr tablet Take 1 tablet (600 mg total) by mouth 2 (two) times daily.  . hydrALAZINE (APRESOLINE) 25 MG tablet Take 1 tablet (25 mg  total) by mouth every 8 (eight) hours as needed (for BP >170). (Patient taking differently: Take 25-50 mg by mouth 3 (three) times daily as needed (for BP >170). )  . irbesartan (AVAPRO) 150 MG tablet Take 150 mg by mouth every evening.   . multivitamin (RENA-VIT) TABS tablet Take 1 tablet by mouth at bedtime.  . torsemide (DEMADEX) 20 MG tablet Take 2 tablets (40 mg total) by mouth daily.  . [DISCONTINUED] aspirin EC 81 MG tablet Take 81 mg by mouth at bedtime.       Review of Systems    Review of Systems  Constitutional: Positive for malaise/fatigue. Negative for chills and fever.  Cardiovascular: Positive for dyspnea on exertion. Negative for chest pain, irregular heartbeat, leg swelling, near-syncope, orthopnea, palpitations and syncope.  Respiratory: Negative for cough, shortness of breath and wheezing.   Gastrointestinal: Negative for melena, nausea and vomiting.  Genitourinary: Negative for hematuria.  Neurological: Positive for weakness. Negative for dizziness and light-headedness.   All other systems reviewed and are otherwise negative except as noted above.  Physical Exam    VS:  BP 124/70 (BP Location: Left Arm, Patient Position: Sitting, Cuff Size: Normal)   Pulse 64   Ht 5' 10.5" (1.791 m)   Wt 110 lb 4 oz (50 kg)   BMI 15.60 kg/m  , BMI Body mass index is 15.6 kg/m. GEN: Thin, well developed, in no acute distress. HEENT: normal. Neck: Supple, no JVD, carotid bruits, or masses. Cardiac: RRR, no rubs, or gallops. 2/6 systolic murmur. No clubbing, cyanosis, edema.  Radials/DP/PT 2+ and equal bilaterally.  Respiratory:  Respirations regular and unlabored, clear to auscultation bilaterally. GI: Soft, nontender, nondistended, BS + x 4. MS: No deformity or atrophy. Skin: Warm and dry, no rash. Neuro:  Strength and sensation are intact. Psych: Normal affect.  Assessment & Plan    1. HFrEF -Echo 11/19/2019 LVEF 30-35%, mild LVH, grade 1 diastolic function.  Lexiscan  during admission low risk study with no evidence of ischemia.  Cardiac cath deferred due to renal function.  Euvolemic on exam.  Endorses fatigue, dyspnea on exertion but these are likely multifactorial due to his multiple comorbidities. NYHA II-III. GDMT limited by ESRD and HD.  Volume management per HD.  He is compliant with low-sodium diet and fluid restriction.  GDMT includes Coreg, irbesartan, torsemide.  Okay to continue irbesartan per nephrology.  He uses hydralazine as needed for elevated blood pressure.  Plan for repeat echo in 8 weeks.  2. CVA / DVT of R IJ / ?atrial fibrillation - Concern for thrombotic etiology.  He was unable to afford Eliquis and has not been on it since discharge.  Start Eliquis 2.5 mg twice daily (reduced dose secondary to body habitus, renal function).  Stop aspirin.  He was provided with 1 month worth of sample of Eliquis today and we will submit patient assistance paperwork-he  was asked to bring back his Social Security statement or 1040 as well as statement from pharmacy detailing cost.  He does not have Medicare part D. 14 day live telemetry monitor placed today to assess for atrial fibrillation as etiology. After 13 days will check monitor results, if atrial fib noted will not repeat monitor. If no atrial fib noted, consider repeat monitor for 1 month data vs referral to EP for ILR.   3. Acute kidney injury / ESRD on HD-renal injury dates back to late 08/2019 in early 09/2019 and has required hemodialysis.  Chronicity of HD remains uncertain.  Plans to transition to home PD.  Continue to follow nephrology.  4. Moderate pleural effusion by echo 11/19/19 - Continue diuresis and HD per nephrology. Plan for repeat echo in 8 weeks, as above.   5. HTN  - BP well controlled. Continue current antihypertensive regimen.   6. AAA s/p endograft repair -continue to follow with [facility].  7. Elevated LFT -noted during hospitalization likely in setting of congestive hepatopathy.   Nephrology monitoring during dialysis labs.  8. Anemia secondary to CKD - Recommend dietary sources of iron. Could be contributory to fatigue and dyspnea.   9. Tobacco use - Smoking cessation encouraged. Recommend utilization of [id number].   Disposition: 2 week live telemetry monitor. Consider second monitor if first is non-revealing. Echocardiogram in 8 weeks. Follow up in 2 month(s) with Dr. [name] or APP.    Loel Dubonnet, NP 12/02/2019, 4:56 PM

## 2019-12-03 ENCOUNTER — Other Ambulatory Visit: Payer: Self-pay | Admitting: *Deleted

## 2019-12-03 MED ORDER — APIXABAN 2.5 MG PO TABS: 2.5000 mg | ORAL_TABLET | Freq: Two times a day (BID) | ORAL | 2 refills | Status: DC

## 2019-12-03 MED ORDER — APIXABAN 2.5 MG PO TABS: 2.5000 mg | ORAL_TABLET | Freq: Two times a day (BID) | ORAL | 3 refills | Status: DC

## 2019-12-03 NOTE — Addendum Note (Signed)
Addended by: Loel Dubonnet on: 12/03/2019 08:04 AM   Modules accepted: Orders

## 2019-12-04 ENCOUNTER — Telehealth: Payer: Self-pay | Admitting: Cardiovascular Disease

## 2019-12-04 NOTE — Telephone Encounter (Signed)
lmov to schedule  °

## 2020-01-06 ENCOUNTER — Telehealth: Payer: Self-pay | Admitting: Family

## 2020-01-06 NOTE — Telephone Encounter (Signed)
  1. Is this related to a heart monitor you are wearing?  (If the patient says no, please ask     if they are caling about ICD/pacemaker.) yes   2. What is your issue??  (If the patient is calling for results of the heart monitor this     message should be sent to nurse.)  Patient monitor alarmed and company could not provide information   Please call to discuss    Please route to covering RN/CMA/RMA for results. Route to monitor technicians or your monitor tech representative for your site for any technical concerns

## 2020-01-06 NOTE — Telephone Encounter (Signed)
Spoke with patients daughter [name], and it sounds like the zio monitor light was on because of loose contact with skin. She removed and is sending into [employer] today.  [name] wanted me to inform Dr. [name] that the patients procedure (PERITONEAL DIALYSIS CATHETER INSERT and OMENTOPEXY) was cancelled today for a systolid BP of 212, and will be rescheduled for 2-3 weeks.  Will route to Dr. [name].

## 2020-01-07 NOTE — Telephone Encounter (Signed)
No changes at this time. Recommend monitoring BP once per day at home. If BP consistently >130/80 when checked at home, recommend they call us & we can reevaluate appointment timing if needed.   His BP at recent clinic visit was <130/80. He may use PRN Hydralazine as previously directed for systolic blood pressure >112.   Will defer any additional recommendations to Dr. Fransisca Kaufmann, [place]

## 2020-01-07 NOTE — Telephone Encounter (Signed)
Patient daughter returning call  °

## 2020-01-07 NOTE — Telephone Encounter (Signed)
Left voicemail message for patient to call back for instructions.

## 2020-01-07 NOTE — Telephone Encounter (Signed)
Called and left a message to call us back tomorrow when the office reopens.

## 2020-01-07 NOTE — Telephone Encounter (Addendum)
Spoke with patients daughter per release form and reviewed provider recommendations to stay the course. She states that they have rescheduled procedure to September which is after his echo is scheduled. Reviewed that we would certainly call with results and then determine if all stays the same or if changes need to be made once he sees Dr. [name]. She verbalized understanding of our conversation, agreement with plan, and had no further questions at this time.

## 2020-01-17 ENCOUNTER — Telehealth: Payer: Self-pay | Admitting: Cardiovascular Disease

## 2020-01-17 NOTE — Telephone Encounter (Addendum)
Spoke with the patients daughter [name]. Adv her that samples of Eliquis will be left at our front desk to be picked up. Our office closes at 5pm.  Patients daughter verbalized understanding and voiced appreciation for the assistance.  Samples of this drug were given to the patient,  Eliquis 2.5mg   quantity 3 boxes,  Lot Number [id number] [id number] Jul 2022

## 2020-01-17 NOTE — Telephone Encounter (Signed)
Patient calling the office for samples of medication:   1.  What medication and dosage are you requesting samples for? Eliquis 2.5 mg  2.  Are you currently out of this medication? yes   They are awaiting social security information to get insurance to help with medications.  Would need sample while waiting.

## 2020-01-22 DIAGNOSIS — N186 End stage renal disease: Secondary | ICD-10-CM | POA: Diagnosis not present

## 2020-01-22 DIAGNOSIS — Z992 Dependence on renal dialysis: Secondary | ICD-10-CM | POA: Diagnosis not present

## 2020-01-24 DIAGNOSIS — N186 End stage renal disease: Secondary | ICD-10-CM | POA: Diagnosis not present

## 2020-01-24 DIAGNOSIS — Z992 Dependence on renal dialysis: Secondary | ICD-10-CM | POA: Diagnosis not present

## 2020-01-27 DIAGNOSIS — N186 End stage renal disease: Secondary | ICD-10-CM | POA: Diagnosis not present

## 2020-01-27 DIAGNOSIS — Z992 Dependence on renal dialysis: Secondary | ICD-10-CM | POA: Diagnosis not present

## 2020-01-28 DIAGNOSIS — R200 Anesthesia of skin: Secondary | ICD-10-CM | POA: Diagnosis not present

## 2020-01-28 DIAGNOSIS — I7300 Raynaud's syndrome without gangrene: Secondary | ICD-10-CM | POA: Diagnosis not present

## 2020-01-28 DIAGNOSIS — I739 Peripheral vascular disease, unspecified: Secondary | ICD-10-CM | POA: Diagnosis not present

## 2020-01-28 DIAGNOSIS — E519 Thiamine deficiency, unspecified: Secondary | ICD-10-CM | POA: Diagnosis not present

## 2020-01-28 DIAGNOSIS — I714 Abdominal aortic aneurysm, without rupture: Secondary | ICD-10-CM | POA: Diagnosis not present

## 2020-01-29 ENCOUNTER — Ambulatory Visit (INDEPENDENT_AMBULATORY_CARE_PROVIDER_SITE_OTHER): Payer: Medicare Other

## 2020-01-29 ENCOUNTER — Other Ambulatory Visit: Payer: Self-pay

## 2020-01-29 DIAGNOSIS — I5020 Unspecified systolic (congestive) heart failure: Secondary | ICD-10-CM | POA: Diagnosis not present

## 2020-01-29 DIAGNOSIS — R252 Cramp and spasm: Secondary | ICD-10-CM | POA: Diagnosis not present

## 2020-01-29 DIAGNOSIS — N170 Acute kidney failure with tubular necrosis: Secondary | ICD-10-CM | POA: Diagnosis not present

## 2020-01-29 DIAGNOSIS — E519 Thiamine deficiency, unspecified: Secondary | ICD-10-CM | POA: Diagnosis not present

## 2020-01-29 DIAGNOSIS — R739 Hyperglycemia, unspecified: Secondary | ICD-10-CM | POA: Diagnosis not present

## 2020-01-29 DIAGNOSIS — Z992 Dependence on renal dialysis: Secondary | ICD-10-CM | POA: Diagnosis not present

## 2020-01-29 DIAGNOSIS — E538 Deficiency of other specified B group vitamins: Secondary | ICD-10-CM | POA: Diagnosis not present

## 2020-01-29 DIAGNOSIS — N2581 Secondary hyperparathyroidism of renal origin: Secondary | ICD-10-CM | POA: Diagnosis not present

## 2020-01-29 DIAGNOSIS — R200 Anesthesia of skin: Secondary | ICD-10-CM | POA: Diagnosis not present

## 2020-01-29 DIAGNOSIS — E531 Pyridoxine deficiency: Secondary | ICD-10-CM | POA: Diagnosis not present

## 2020-01-29 DIAGNOSIS — E559 Vitamin D deficiency, unspecified: Secondary | ICD-10-CM | POA: Diagnosis not present

## 2020-01-29 DIAGNOSIS — R202 Paresthesia of skin: Secondary | ICD-10-CM | POA: Diagnosis not present

## 2020-01-29 DIAGNOSIS — N186 End stage renal disease: Secondary | ICD-10-CM | POA: Diagnosis not present

## 2020-01-29 LAB — ECHOCARDIOGRAM COMPLETE
AR max vel: 2.79 cm2
AV Area VTI: 2.94 cm2
AV Area mean vel: 3.19 cm2
AV Mean grad: 2 mmHg
AV Peak grad: 4.3 mmHg
Ao pk vel: 1.04 m/s
Area-P 1/2: 4.15 cm2
Calc EF: 38.8 %
S' Lateral: 3.6 cm
Single Plane A2C EF: 35.4 %
Single Plane A4C EF: 45.9 %

## 2020-01-31 DIAGNOSIS — Z992 Dependence on renal dialysis: Secondary | ICD-10-CM | POA: Diagnosis not present

## 2020-01-31 DIAGNOSIS — N186 End stage renal disease: Secondary | ICD-10-CM | POA: Diagnosis not present

## 2020-02-01 DIAGNOSIS — J449 Chronic obstructive pulmonary disease, unspecified: Secondary | ICD-10-CM | POA: Diagnosis not present

## 2020-02-03 DIAGNOSIS — N2581 Secondary hyperparathyroidism of renal origin: Secondary | ICD-10-CM | POA: Diagnosis not present

## 2020-02-03 DIAGNOSIS — N186 End stage renal disease: Secondary | ICD-10-CM | POA: Diagnosis not present

## 2020-02-03 DIAGNOSIS — Z992 Dependence on renal dialysis: Secondary | ICD-10-CM | POA: Diagnosis not present

## 2020-02-05 DIAGNOSIS — Z992 Dependence on renal dialysis: Secondary | ICD-10-CM | POA: Diagnosis not present

## 2020-02-05 DIAGNOSIS — N186 End stage renal disease: Secondary | ICD-10-CM | POA: Diagnosis not present

## 2020-02-07 DIAGNOSIS — Z992 Dependence on renal dialysis: Secondary | ICD-10-CM | POA: Diagnosis not present

## 2020-02-07 DIAGNOSIS — N186 End stage renal disease: Secondary | ICD-10-CM | POA: Diagnosis not present

## 2020-02-10 DIAGNOSIS — N186 End stage renal disease: Secondary | ICD-10-CM | POA: Diagnosis not present

## 2020-02-10 DIAGNOSIS — Z992 Dependence on renal dialysis: Secondary | ICD-10-CM | POA: Diagnosis not present

## 2020-02-10 DIAGNOSIS — J449 Chronic obstructive pulmonary disease, unspecified: Secondary | ICD-10-CM | POA: Diagnosis not present

## 2020-02-11 ENCOUNTER — Ambulatory Visit: Payer: Medicare Other | Admitting: Physical Therapy

## 2020-02-12 DIAGNOSIS — N186 End stage renal disease: Secondary | ICD-10-CM | POA: Diagnosis not present

## 2020-02-12 DIAGNOSIS — Z992 Dependence on renal dialysis: Secondary | ICD-10-CM | POA: Diagnosis not present

## 2020-02-13 ENCOUNTER — Ambulatory Visit: Payer: Medicare Other | Attending: Neurology | Admitting: Physical Therapy

## 2020-02-14 DIAGNOSIS — N186 End stage renal disease: Secondary | ICD-10-CM | POA: Diagnosis not present

## 2020-02-14 DIAGNOSIS — Z992 Dependence on renal dialysis: Secondary | ICD-10-CM | POA: Diagnosis not present

## 2020-02-17 DIAGNOSIS — N2581 Secondary hyperparathyroidism of renal origin: Secondary | ICD-10-CM | POA: Diagnosis not present

## 2020-02-17 DIAGNOSIS — D649 Anemia, unspecified: Secondary | ICD-10-CM | POA: Diagnosis not present

## 2020-02-17 DIAGNOSIS — Z992 Dependence on renal dialysis: Secondary | ICD-10-CM | POA: Diagnosis not present

## 2020-02-17 DIAGNOSIS — Z79899 Other long term (current) drug therapy: Secondary | ICD-10-CM | POA: Diagnosis not present

## 2020-02-17 DIAGNOSIS — N186 End stage renal disease: Secondary | ICD-10-CM | POA: Diagnosis not present

## 2020-02-18 ENCOUNTER — Ambulatory Visit: Payer: Medicare Other

## 2020-02-18 NOTE — Progress Notes (Signed)
Cardiology Office Note  Date:  02/19/2020   ID:  Scott, Oconnor 1946-02-02, MRN [id number]  PCP:  Scott Lighter, MD   Chief Complaint  Patient presents with   office visit    2 month F/U after cardiac testing; Meds verbally reviewed with patient's daughter, Scott Oconnor.    HPI:  Scott Oconnor is a 74 y.o. ([birth year]) male with past medical history of 5 cm AAA Hyperlipidemia, hypertension Smoker Raynaud's disease without gangrene Smoker Borderline diabetes ESRD on HD M?W?F Who presents  For f/u of his  5 cm AAA, stent  Recent studies reviewed stress test 11/2019 Pharmacological myocardial perfusion imaging study with no significant  Ischemia GI uptake artifact noted Normal wall motion, EF estimated at 44% No EKG changes concerning for ischemia at peak stress or in recovery. Baseline EKG with LVH, strain pattern, nonspecific ST abnormality CT attenuation correction images with mild three-vessel coronary calcification, mild aortic atherosclerosis,  right pleural effusion, AAA endograft stent noted Low to moderate risk scan  Carotid u/s Mild (1-49%) stenosis proximal b/l internal carotid artery large volume nearly occlusive deep venous thrombosis in the right internal jugular vein.  admitted 11/18/19  to [facility] with shortness of breath, malaise, palpitations.    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  PSH:    Past Surgical History:  Procedure Laterality Date   DIALYSIS/PERMA CATHETER INSERTION N/A 09/26/2019   Procedure: DIALYSIS/PERMA CATHETER INSERTION;  Surgeon: Algernon Huxley, MD;  Location: [place] [facility];  Service: Cardiovascular;  Laterality: N/A;   ENDOVASCULAR REPAIR/STENT GRAFT N/A 08/14/2019   Procedure: ENDOVASCULAR REPAIR/STENT GRAFT;  Surgeon: Katha Cabal, MD;  Location: [place] [facility];  Service: Cardiovascular;  Laterality: N/A;    Current Outpatient Medications  Medication Sig Dispense Refill   acetaminophen (TYLENOL) 500 MG tablet Take 500 mg by mouth every 8 (eight) hours as needed for moderate pain.      atorvastatin (LIPITOR) 40 MG tablet Take 1 tablet (40 mg total) by mouth daily. 30 tablet 2   carvedilol (COREG) 6.25 MG tablet Take 1 tablet (6.25 mg total) by mouth 2 (two) times daily with a meal. 60 tablet 2   gabapentin (NEURONTIN) 100 MG capsule Take 100 mg by mouth at bedtime.     hydrALAZINE (APRESOLINE) 50 MG tablet Take 50 mg by mouth 3 (three) times daily.     irbesartan (AVAPRO) 150 MG tablet Take 150 mg by mouth every evening.      multivitamin (RENA-VIT) TABS tablet Take 1 tablet by mouth at bedtime. 30 tablet 2   torsemide (DEMADEX) 20 MG tablet Take 2 tablets (40 mg total) by mouth daily. 30 tablet 1  albuterol (VENTOLIN HFA) 108 (90 Base) MCG/ACT inhaler Inhale 2 puffs into the lungs every 6 (six) hours as needed for wheezing or shortness of breath. 18 g 0   apixaban (ELIQUIS) 2.5 MG TABS tablet Take 1 tablet (2.5 mg total) by mouth 2 (two) times daily. (Patient not taking: Reported on 02/19/2020) 180 tablet 3   docusate sodium (COLACE) 100 MG capsule Take 1 capsule (100 mg  total) by mouth 2 (two) times daily as needed for mild constipation. 10 capsule 0   guaiFENesin (MUCINEX) 600 MG 12 hr tablet Take 1 tablet (600 mg total) by mouth 2 (two) times daily. 30 tablet 0   hydrALAZINE (APRESOLINE) 25 MG tablet Take 1 tablet (25 mg total) by mouth every 8 (eight) hours as needed (for BP >170). (Patient taking differently: Take 50 mg by mouth 3 (three) times daily as needed (for BP >170). ) 30 tablet 0   No current facility-administered medications for this visit.    Allergies:   Ivp dye [iodinated diagnostic agents]   Social History:  The patient  reports that he has quit smoking. His smoking use included cigarettes. He smoked 0.33 packs per day. He has never used smokeless tobacco. He reports that he does not drink alcohol and does not use drugs.   Family History:   family history includes Heart disease in his brother, brother, father, sister, and sister; Hyperlipidemia in his brother, brother, brother, brother, brother, and brother; Sudden Cardiac Death in his father.    Review of Systems: Review of Systems  Constitutional: Negative.   HENT: Negative.   Respiratory: Negative.   Cardiovascular: Negative.   Gastrointestinal: Negative.   Musculoskeletal: Negative.   Neurological: Negative.   Psychiatric/Behavioral: Negative.   All other systems reviewed and are negative.   PHYSICAL EXAM: VS:  BP 134/84 (BP Location: Left Arm, Patient Position: Sitting, Cuff Size: Normal)    Pulse 60    Ht 5' 10.5" (1.791 m)    Wt 122 lb 2 oz (55.4 kg)    SpO2 95%    BMI 17.28 kg/m  , BMI Body mass index is 17.28 kg/m. Constitutional:  oriented to person, place, and time. No distress.  HENT:  Head: Grossly normal Eyes:  no discharge. No scleral icterus.  Neck: No JVD, no carotid bruits  Cardiovascular: Regular rate and rhythm, no murmurs appreciated Pulmonary/Chest: Clear to auscultation bilaterally, no wheezes or rails Abdominal: Soft.  no distension.  no tenderness.   Musculoskeletal: Normal range of motion Neurological:  normal muscle tone. Coordination normal. No atrophy Skin: Skin warm and dry Psychiatric: normal affect, pleasant   Recent Labs: 09/19/2019: TSH CANCELED 11/18/2019: B Natriuretic Peptide >4,500.0 11/20/2019: ALT 69 11/23/2019: BUN 61; BUN 62; Creatinine, Ser 7.30; Creatinine, Ser 7.55; Hemoglobin 10.6; Magnesium 2.1; Platelets 161; Potassium 3.6; Potassium 3.6; Sodium 138; Sodium 138    Lipid Panel Lab Results  Component Value Date   CHOL 163 11/20/2019   HDL 48 11/20/2019   LDLCALC 90 11/20/2019   TRIG 126 11/20/2019      Wt Readings from Last 3 Encounters:  02/19/20 122 lb 2 oz (55.4 kg)  12/02/19 110 lb 4 oz (50 kg)  11/23/19 98 lb 12.8 oz (44.8 kg)     ASSESSMENT AND PLAN:  Problem List Items Addressed This Visit      Cardiology Problems   HFrEF (heart failure with reduced ejection fraction) (HCC) - Primary   Relevant Medications   hydrALAZINE (APRESOLINE) 50 MG tablet   Essential hypertension  Relevant Medications   hydrALAZINE (APRESOLINE) 50 MG tablet   AAA (abdominal aortic aneurysm) without rupture (HCC)   Relevant Medications   hydrALAZINE (APRESOLINE) 50 MG tablet     Other   Anemia    Other Visit Diagnoses    PAD (peripheral artery disease) (HCC)       Relevant Medications   hydrALAZINE (APRESOLINE) 50 MG tablet   Smoker          AAA/PAD Followed by vascular, endograft placed Stressed importance of aggressive lipid control  Hyperlipidemia He prefers to have lab work done through hemodialysis, nephrology Goal LDL less than 70, total cholesterol less than 150 He will call us when lab work is available for review  DVT subclavian vein Will remain on Eliquis at this time until after port is removed  Smoker Cessation recommended  End-stage renal disease on hemodialysis Note indicating he will move towards peritoneal dialysis   Total encounter time more than 25 minutes  Greater than  50% was spent in counseling and coordination of care with the patient   Signed, Esmond Plants, M.D., Ph.D. [place], [place]

## 2020-02-19 ENCOUNTER — Encounter: Payer: Self-pay | Admitting: Cardiovascular Disease

## 2020-02-19 ENCOUNTER — Ambulatory Visit (INDEPENDENT_AMBULATORY_CARE_PROVIDER_SITE_OTHER): Payer: Medicare Other | Admitting: Cardiovascular Disease

## 2020-02-19 ENCOUNTER — Other Ambulatory Visit: Payer: Self-pay

## 2020-02-19 VITALS — BP 134/84 | HR 60 | Ht 70.5 in | Wt 122.1 lb

## 2020-02-19 DIAGNOSIS — I739 Peripheral vascular disease, unspecified: Secondary | ICD-10-CM | POA: Diagnosis not present

## 2020-02-19 DIAGNOSIS — Z992 Dependence on renal dialysis: Secondary | ICD-10-CM | POA: Diagnosis not present

## 2020-02-19 DIAGNOSIS — I714 Abdominal aortic aneurysm, without rupture, unspecified: Secondary | ICD-10-CM

## 2020-02-19 DIAGNOSIS — D631 Anemia in chronic kidney disease: Secondary | ICD-10-CM

## 2020-02-19 DIAGNOSIS — I10 Essential (primary) hypertension: Secondary | ICD-10-CM

## 2020-02-19 DIAGNOSIS — N185 Chronic kidney disease, stage 5: Secondary | ICD-10-CM | POA: Diagnosis not present

## 2020-02-19 DIAGNOSIS — F17200 Nicotine dependence, unspecified, uncomplicated: Secondary | ICD-10-CM

## 2020-02-19 DIAGNOSIS — N186 End stage renal disease: Secondary | ICD-10-CM | POA: Diagnosis not present

## 2020-02-19 DIAGNOSIS — I5020 Unspecified systolic (congestive) heart failure: Secondary | ICD-10-CM

## 2020-02-19 NOTE — Patient Instructions (Addendum)
Goal total chol <150, LDL <70  Medication Instructions:  eliquis 2.5 twice a day, samples for a few weeks    If you need a refill on your cardiac medications before your next appointment, please call your pharmacy.    Lab work: No new labs needed   If you have labs (blood work) drawn today and your tests are completely normal, you will receive your results only by: Marland Kitchen MyChart Message (if you have MyChart) OR . A paper copy in the mail If you have any lab test that is abnormal or we need to change your treatment, we will call you to review the results.   Testing/Procedures: No new testing needed   Follow-Up: At [facility], you and your health needs are our priority.  As part of our continuing mission to provide you with exceptional heart care, we have created designated Provider Care Teams.  These Care Teams include your primary Cardiologist (physician) and Advanced Practice Providers (APPs -  Physician Assistants and Nurse Practitioners) who all work together to provide you with the care you need, when you need it.  . You will need a follow up appointment in 3 months  . Providers on your designated Care Team:   . Murray Hodgkins, NP . Christell Faith, PA-C . Marrianne Mood, PA-C  Any Other Special Instructions Will Be Listed Below (If Applicable).  COVID-19 Vaccine Information can be found at: ShippingScam.co.uk For questions related to vaccine distribution or appointments, please email [email or web address] or call [phone].    Medication Samples have been provided to the patient.  Drug name: Eliquis        Strength: 2.5 mg         Qty: 4 boxes   LOT: [id number]   Exp.Date: 7/22

## 2020-02-20 ENCOUNTER — Ambulatory Visit: Payer: Medicare Other

## 2020-02-20 DIAGNOSIS — Z992 Dependence on renal dialysis: Secondary | ICD-10-CM | POA: Diagnosis not present

## 2020-02-20 DIAGNOSIS — N186 End stage renal disease: Secondary | ICD-10-CM | POA: Diagnosis not present

## 2020-02-21 DIAGNOSIS — Z992 Dependence on renal dialysis: Secondary | ICD-10-CM | POA: Diagnosis not present

## 2020-02-21 DIAGNOSIS — N186 End stage renal disease: Secondary | ICD-10-CM | POA: Diagnosis not present

## 2020-02-24 DIAGNOSIS — N186 End stage renal disease: Secondary | ICD-10-CM | POA: Diagnosis not present

## 2020-02-24 DIAGNOSIS — Z992 Dependence on renal dialysis: Secondary | ICD-10-CM | POA: Diagnosis not present

## 2020-02-25 ENCOUNTER — Ambulatory Visit: Payer: Medicare Other | Admitting: Physical Therapy

## 2020-02-26 DIAGNOSIS — N186 End stage renal disease: Secondary | ICD-10-CM | POA: Diagnosis not present

## 2020-02-26 DIAGNOSIS — Z992 Dependence on renal dialysis: Secondary | ICD-10-CM | POA: Diagnosis not present

## 2020-02-27 ENCOUNTER — Ambulatory Visit: Payer: Medicare Other | Admitting: Physical Therapy

## 2020-02-27 DIAGNOSIS — M79675 Pain in left toe(s): Secondary | ICD-10-CM | POA: Diagnosis not present

## 2020-02-27 DIAGNOSIS — M79674 Pain in right toe(s): Secondary | ICD-10-CM | POA: Diagnosis not present

## 2020-02-27 DIAGNOSIS — L97522 Non-pressure chronic ulcer of other part of left foot with fat layer exposed: Secondary | ICD-10-CM | POA: Diagnosis not present

## 2020-02-27 DIAGNOSIS — I7300 Raynaud's syndrome without gangrene: Secondary | ICD-10-CM | POA: Diagnosis not present

## 2020-02-27 DIAGNOSIS — B351 Tinea unguium: Secondary | ICD-10-CM | POA: Diagnosis not present

## 2020-02-28 DIAGNOSIS — N186 End stage renal disease: Secondary | ICD-10-CM | POA: Diagnosis not present

## 2020-02-28 DIAGNOSIS — Z992 Dependence on renal dialysis: Secondary | ICD-10-CM | POA: Diagnosis not present

## 2020-03-02 DIAGNOSIS — J449 Chronic obstructive pulmonary disease, unspecified: Secondary | ICD-10-CM | POA: Diagnosis not present

## 2020-03-02 DIAGNOSIS — N186 End stage renal disease: Secondary | ICD-10-CM | POA: Diagnosis not present

## 2020-03-02 DIAGNOSIS — Z992 Dependence on renal dialysis: Secondary | ICD-10-CM | POA: Diagnosis not present

## 2020-03-02 DIAGNOSIS — N2581 Secondary hyperparathyroidism of renal origin: Secondary | ICD-10-CM | POA: Diagnosis not present

## 2020-03-03 ENCOUNTER — Ambulatory Visit: Payer: Medicare Other | Admitting: Physical Therapy

## 2020-03-04 DIAGNOSIS — N186 End stage renal disease: Secondary | ICD-10-CM | POA: Diagnosis not present

## 2020-03-04 DIAGNOSIS — Z992 Dependence on renal dialysis: Secondary | ICD-10-CM | POA: Diagnosis not present

## 2020-03-05 ENCOUNTER — Ambulatory Visit: Payer: Medicare Other

## 2020-03-06 DIAGNOSIS — Z992 Dependence on renal dialysis: Secondary | ICD-10-CM | POA: Diagnosis not present

## 2020-03-06 DIAGNOSIS — N186 End stage renal disease: Secondary | ICD-10-CM | POA: Diagnosis not present

## 2020-03-09 DIAGNOSIS — N186 End stage renal disease: Secondary | ICD-10-CM | POA: Diagnosis not present

## 2020-03-09 DIAGNOSIS — Z992 Dependence on renal dialysis: Secondary | ICD-10-CM | POA: Diagnosis not present

## 2020-03-10 ENCOUNTER — Ambulatory Visit: Payer: Medicare Other

## 2020-03-11 DIAGNOSIS — N186 End stage renal disease: Secondary | ICD-10-CM | POA: Diagnosis not present

## 2020-03-11 DIAGNOSIS — Z992 Dependence on renal dialysis: Secondary | ICD-10-CM | POA: Diagnosis not present

## 2020-03-11 DIAGNOSIS — J449 Chronic obstructive pulmonary disease, unspecified: Secondary | ICD-10-CM | POA: Diagnosis not present

## 2020-03-12 ENCOUNTER — Other Ambulatory Visit: Payer: Self-pay

## 2020-03-12 ENCOUNTER — Ambulatory Visit: Payer: Medicare Other

## 2020-03-12 ENCOUNTER — Ambulatory Visit: Payer: Medicare Other | Attending: Neurology | Admitting: Physical Therapy

## 2020-03-12 ENCOUNTER — Encounter: Payer: Self-pay | Admitting: Physical Therapy

## 2020-03-12 DIAGNOSIS — R262 Difficulty in walking, not elsewhere classified: Secondary | ICD-10-CM | POA: Diagnosis not present

## 2020-03-12 DIAGNOSIS — R2689 Other abnormalities of gait and mobility: Secondary | ICD-10-CM

## 2020-03-12 DIAGNOSIS — M6281 Muscle weakness (generalized): Secondary | ICD-10-CM | POA: Insufficient documentation

## 2020-03-12 NOTE — Therapy (Signed)
[place] MAIN [facility] SERVICES [address] [place], [place], [address] Phone: [phone]   Fax:  [phone]  Physical Therapy Evaluation  Patient Details  Name: Scott Oconnor MRN: [id number] Date of Birth: Aug 14, 1945 Referring Provider (PT): shah, [place]   Encounter Date: 03/12/2020   PT End of Session - 03/12/20 1654    Visit Number 1    Number of Visits 17    Date for PT Re-Evaluation 05/07/20    PT Start Time 0445    PT Stop Time 0545    PT Time Calculation (min) 60 min    Equipment Utilized During Treatment Gait belt    Activity Tolerance Patient tolerated treatment well    Behavior During Therapy [facility] for tasks assessed/performed           Past Medical History:  Diagnosis Date  . Hyperlipidemia   . Hypertension   . PVD (peripheral vascular disease) ([place])   . Reynolds syndrome [facility])     Past Surgical History:  Procedure Laterality Date  . DIALYSIS/PERMA CATHETER INSERTION N/A 09/26/2019   Procedure: DIALYSIS/PERMA CATHETER INSERTION;  Surgeon: Algernon Huxley, MD;  Location: [place] [facility];  Service: Cardiovascular;  Laterality: N/A;  . ENDOVASCULAR REPAIR/STENT GRAFT N/A 08/14/2019   Procedure: ENDOVASCULAR REPAIR/STENT GRAFT;  Surgeon: Katha Cabal, MD;  Location: [place] [facility];  Service: Cardiovascular;  Laterality: N/A;    There were no vitals filed for this visit.    Subjective Assessment - 03/12/20 1650    Subjective Patient reportst that he is fearful of doing activity. He is not as active as he was.    Pertinent History Abdominal aortic aneurysm with aortic stenosis causing Peripheral Vascular Disease - status post bilateral iliac stents and endograft 08/14/2019 - following vascular surgery with Dr. Delana Meyer Patient has Imbalance - likely secondary to peripheral neuropathy  Patient has Raynaud's Phenomenon - bluish discoloration to finger tips and toes  and End stage renal disease - receiving  hemodialysis with port in right subclavicular region Patient reports that getting up is getting difficult. He used to work out on a stationary bike and walk.He used to go to [employer] every day. Now he not going due to fear.    Currently in Pain? Yes    Pain Score 2     Pain Location Toe (Comment which one)    Pain Descriptors / Indicators Aching    Pain Type Acute pain    Pain Onset 1 to 4 weeks ago    Pain Frequency Occasional    Multiple Pain Sites No            PAIN: Toe pain bilaterally  POSTURE:WNL   PROM/AROM:WFL BUE, BLE   STRENGTH:  Graded on a 0-5 scale Muscle Group Left Right                          Hip Flex 3+/5 3+/5  Hip Abd 3/5 3/5  Hip Add -3/5 -3/5  Hip Ext -3/5 -3/5      Knee Flex 5/5 5/5  Knee Ext 5/5 5/5  Ankle DF 5/5 5/5  Ankle PF 4/5 4/5   SENSATION:  BUE : WNL BLE : WNL     SOMATOSENSORY:  Any N & T in extremities or weakness: reports :         Sensation           Intact      Diminished  Absent  Light touch LEs                                   FUNCTIONAL MOBILITY: MI due to slow mobility   BALANCE: Static Standing Balance  Normal Able to maintain standing balance against maximal resistance   Good Able to maintain standing balance against moderate resistance   Good-/Fair+ Able to maintain standing balance against minimal resistance x  Fair Able to stand unsupported without UE support and without LOB for 1-2 min   Fair- Requires Min A and UE support to maintain standing without loss of balance   Poor+ Requires mod A and UE support to maintain standing without loss of balance   Poor Requires max A and UE support to maintain standing balance without loss    Standing Dynamic Balance  Normal Stand independently unsupported, able to weight shift and cross midline maximally   Good Stand independently unsupported, able to weight shift and cross midline moderately   Good-/Fair+ Stand independently unsupported, able to  weight shift across midline minimally x  Fair Stand independently unsupported, weight shift, and reach ipsilaterally, loss of balance when crossing midline   Poor+ Able to stand with Min A and reach ipsilaterally, unable to weight shift   Poor Able to stand with Mod A and minimally reach ipsilaterally, unable to cross midline.       GAIT: Decreased gait speed without AD   OUTCOME MEASURES: TEST Outcome Interpretation  5 times sit<>stand 31.34sec >73 yo, >15 sec indicates increased risk for falls  10 meter walk test      .61           m/s <1.0 m/s indicates increased risk for falls; limited community ambulator  Timed up and Go      17.77           sec <14 sec indicates increased risk for falls      Berg Balance Assessment 47/56 <36/56 (100% risk for falls), 37-45 (80% risk for falls); 46-51 (>50% risk for falls); 52-55 (lower risk <25% of falls)         [facility] PT Assessment - 03/12/20 0001      Assessment   Medical Diagnosis imbalance    Referring Provider (PT) shah, hemang    Onset Date/Surgical Date 01/30/20    Prior Therapy no      Precautions   Precautions None      Restrictions   Weight Bearing Restrictions No      Balance Screen   Has the patient fallen in the past 6 months No    Has the patient had a decrease in activity level because of a fear of falling?  Yes    Is the patient reluctant to leave their home because of a fear of falling?  No      Home Social worker Private residence    Living Arrangements Spouse/significant other;Children;Other relatives    Available Help at Discharge Family    Type of [place] to enter    Entrance Stairs-Number of Steps 3    Entrance Stairs-Rails Right    Home Layout Two level    [place] None      Prior Function   Level of Independence Independent    Vocation Retired    Leisure garadening, grand children      Cognition   Overall Cognitive  Status Within Functional Limits for  tasks assessed      Balance   Balance Assessed Yes      Standardized Balance Assessment   Standardized Balance Assessment Berg Balance Test      Berg Balance Test   Sit to Stand Able to stand  independently using hands    Standing Unsupported Able to stand safely 2 minutes    Sitting with Back Unsupported but Feet Supported on Floor or Stool Able to sit safely and securely 2 minutes    Stand to Sit Controls descent by using hands    Transfers Able to transfer safely, definite need of hands    Standing Unsupported with Eyes Closed Able to stand 10 seconds safely    Standing Unsupported with Feet Together Able to place feet together independently and stand 1 minute safely    From Standing, Reach Forward with Outstretched Arm Can reach forward >5 cm safely (2")    From Standing Position, Pick up Object from Floor Able to pick up shoe, needs supervision    From Standing Position, Turn to Look Behind Over each Shoulder Looks behind from both sides and weight shifts well    Turn 360 Degrees Able to turn 360 degrees safely in 4 seconds or less    Standing Unsupported, Alternately Place Feet on Step/Stool Able to stand independently and complete 8 steps >20 seconds    Standing Unsupported, One Foot in Front Able to take small step independently and hold 30 seconds    Standing on One Leg Able to lift leg independently and hold > 10 seconds    Total Score 47                      Objective measurements completed on examination: See above findings.               PT Education - 03/12/20 1653    Education Details plan of care    Person(s) Educated Patient    Methods Explanation    Comprehension Verbalized understanding            PT Short Term Goals - 03/12/20 1731      PT SHORT TERM GOAL #1   Title Patient will be independent in home exercise program to improve strength/mobility for better functional independence with ADLs.    Time 4    Period Weeks    Status  New    Target Date 04/09/20      PT SHORT TERM GOAL #2   Title Patient (> 27 years old) will complete five times sit to stand test in < 15 seconds indicating an increased LE strength and improved balance.    Time 4    Period Weeks    Status New    Target Date 04/09/20             PT Long Term Goals - 03/12/20 1733      PT LONG TERM GOAL #1   Title Patient will increase Berg Balance score by > 6 points to demonstrate decreased fall risk during functional activities.    Time 8    Period Weeks    Status New    Target Date 05/05/20      PT LONG TERM GOAL #2   Title Patient will increase 10 meter walk test to >1.25m/s as to improve gait speed for better community ambulation and to reduce fall risk.    Time 8    Period Weeks  Status New    Target Date 05/05/20      PT LONG TERM GOAL #3   Title Patient will reduce timed up and go to <11 seconds to reduce fall risk and demonstrate improved transfer/gait ability.    Time 8    Period Weeks    Status New    Target Date 05/05/20      PT LONG TERM GOAL #4   Title Patient will ascend/descend 4 stairs without rail assist independently without loss of balance to improve ability to get in/out of home.    Time 8    Period Weeks    Status New    Target Date 05/05/20                  Plan - 03/12/20 1724    Clinical Impression Statement Patient presents with decreased gait speed, decreased balance, and decreased BLE strength. Patient's main complaint is BLE weakness and inability to participate in desired activities. Patient wants to improve his balance and ability to ambulate on inclines and outdoor surfaces safely. Patient will benefit from skilled PT in order to increase gait speed, increase BLE strength, and improve dynamic standing balance to decrease risk for falls and enable patient to participate in desired activities.   Personal Factors and Comorbidities Age    Stability/Clinical Decision Making Stable/Uncomplicated     Clinical Decision Making Low    Rehab Potential Good    PT Frequency 2x / week    PT Duration 8 weeks    PT Treatment/Interventions Stair training;Therapeutic activities;Therapeutic exercise;Patient/family education;Manual techniques;Passive range of motion;Neuromuscular re-education;Balance training    PT Home Exercise Plan balance and strengthening    Consulted and Agree with Plan of Care Patient;Family member/caregiver           Patient will benefit from skilled therapeutic intervention in order to improve the following deficits and impairments:  Abnormal gait, Decreased activity tolerance, Decreased endurance, Difficulty walking, Decreased balance, Decreased mobility  Visit Diagnosis: Muscle weakness (generalized)  Difficulty in walking, not elsewhere classified  Other abnormalities of gait and mobility     Problem List Patient Active Problem List   Diagnosis Date Noted  . HFrEF (heart failure with reduced ejection fraction) ([place])   . Acute pulmonary edema ([place]) 11/18/2019  . Protein-calorie malnutrition, severe 09/27/2019  . Acute kidney failure, unspecified ([place])   . History of AAA (abdominal aortic aneurysm) repair   . Anemia   . Thrombocytopenia ([place])   . Acute kidney injury superimposed on CKD ([place])   . Essential hypertension   . Hyperlipidemia   . Fatigue 09/19/2019  . AAA (abdominal aortic aneurysm) ([place]) 08/14/2019  . AAA (abdominal aortic aneurysm) without rupture ([place]) 08/01/2019  . PVD (peripheral vascular disease) ([place]) 07/24/2019  . Tobacco abuse 07/24/2019  . Raynaud's disease without gangrene 06/21/2019  . Mixed dyslipidemia 06/15/2018  . Family history of heart attack 06/15/2018  . Smokers' cough ([place]) 06/16/2017    Alanson Puls, PT DPT 03/12/2020, 5:51 PM  [place] MAIN [facility] SERVICES [address] [place], [place], [address] Phone: [phone]   Fax:  [phone]  Name: Damarious Holtsclaw MRN: [id number] Date of Birth: 09-03-1945

## 2020-03-13 DIAGNOSIS — N186 End stage renal disease: Secondary | ICD-10-CM | POA: Diagnosis not present

## 2020-03-13 DIAGNOSIS — Z992 Dependence on renal dialysis: Secondary | ICD-10-CM | POA: Diagnosis not present

## 2020-03-16 DIAGNOSIS — D649 Anemia, unspecified: Secondary | ICD-10-CM | POA: Diagnosis not present

## 2020-03-16 DIAGNOSIS — N2581 Secondary hyperparathyroidism of renal origin: Secondary | ICD-10-CM | POA: Diagnosis not present

## 2020-03-16 DIAGNOSIS — Z992 Dependence on renal dialysis: Secondary | ICD-10-CM | POA: Diagnosis not present

## 2020-03-16 DIAGNOSIS — Z79899 Other long term (current) drug therapy: Secondary | ICD-10-CM | POA: Diagnosis not present

## 2020-03-16 DIAGNOSIS — D509 Iron deficiency anemia, unspecified: Secondary | ICD-10-CM | POA: Diagnosis not present

## 2020-03-16 DIAGNOSIS — N186 End stage renal disease: Secondary | ICD-10-CM | POA: Diagnosis not present

## 2020-03-17 ENCOUNTER — Encounter: Payer: Self-pay | Admitting: Physical Therapy

## 2020-03-17 ENCOUNTER — Ambulatory Visit: Payer: Medicare Other | Admitting: Physical Therapy

## 2020-03-17 ENCOUNTER — Other Ambulatory Visit: Payer: Self-pay

## 2020-03-17 DIAGNOSIS — R262 Difficulty in walking, not elsewhere classified: Secondary | ICD-10-CM

## 2020-03-17 DIAGNOSIS — R2689 Other abnormalities of gait and mobility: Secondary | ICD-10-CM

## 2020-03-17 DIAGNOSIS — M6281 Muscle weakness (generalized): Secondary | ICD-10-CM | POA: Diagnosis not present

## 2020-03-17 NOTE — Therapy (Signed)
[place] MAIN [facility] SERVICES [address] [place], [place], [address] Phone: [phone]   Fax:  [phone]  Physical Therapy Treatment  Patient Details  Name: Scott Oconnor MRN: [id number] Date of Birth: 1945-11-13 Referring Provider (PT): shah, [place]   Encounter Date: 03/17/2020   PT End of Session - 03/17/20 1504    Visit Number 2    Number of Visits 17    Date for PT Re-Evaluation 05/07/20    PT Start Time 0315    PT Stop Time 0400    PT Time Calculation (min) 45 min    Equipment Utilized During Treatment Gait belt    Activity Tolerance Patient tolerated treatment well    Behavior During Therapy WFL for tasks assessed/performed           Past Medical History:  Diagnosis Date  . Hyperlipidemia   . Hypertension   . PVD (peripheral vascular disease) ([place])   . Reynolds syndrome [facility])     Past Surgical History:  Procedure Laterality Date  . DIALYSIS/PERMA CATHETER INSERTION N/A 09/26/2019   Procedure: DIALYSIS/PERMA CATHETER INSERTION;  Surgeon: Algernon Huxley, MD;  Location: [place] [facility];  Service: Cardiovascular;  Laterality: N/A;  . ENDOVASCULAR REPAIR/STENT GRAFT N/A 08/14/2019   Procedure: ENDOVASCULAR REPAIR/STENT GRAFT;  Surgeon: Katha Cabal, MD;  Location: [place] [facility];  Service: Cardiovascular;  Laterality: N/A;    There were no vitals filed for this visit.   Subjective Assessment - 03/17/20 1526    Pertinent History Abdominal aortic aneurysm with aortic stenosis causing Peripheral Vascular Disease - status post bilateral iliac stents and endograft 08/14/2019 - following vascular surgery with Dr. Delana Meyer Patient has Imbalance - likely secondary to peripheral neuropathy  Patient has Raynaud's Phenomenon - bluish discoloration to finger tips and toes  and End stage renal disease - receiving hemodialysis with port in right subclavicular region Patient reports that getting up is getting difficult. He  used to work out on a stationary bike and walk.He used to go to [employer] every day. Now he not going due to fear.    Currently in Pain? No/denies    Pain Score 0-No pain    Pain Onset 1 to 4 weeks ago          Treatment: Octane fitness x 3 mins L4 Leg press 40 lbs x 20  Standing stepping over 2 hurdle's x 10  Step ups to 6 inch stool x 10  Tapping From foam to 6 inch stool x 10  High marching x 10 BLE Heel raises with 2 sec hold x 10  1/2 foam flat side up and heel toe rock x 20   Patient has low stamina and needs frequent rest periods. Patient performed with instruction, verbal cues, tactile cues of therapist: goal: increase tissue extensibility, promote proper posture, improve mobility                           PT Education - 03/17/20 1504    Education Details HEP    Person(s) Educated Patient    Methods Explanation    Comprehension Verbalized understanding;Tactile cues required;Returned demonstration;Need further instruction            PT Short Term Goals - 03/12/20 1731      PT SHORT TERM GOAL #1   Title Patient will be independent in home exercise program to improve strength/mobility for better functional independence with ADLs.  Time 4    Period Weeks    Status New    Target Date 04/09/20      PT SHORT TERM GOAL #2   Title Patient (> 30 years old) will complete five times sit to stand test in < 15 seconds indicating an increased LE strength and improved balance.    Time 4    Period Weeks    Status New    Target Date 04/09/20             PT Long Term Goals - 03/12/20 1733      PT LONG TERM GOAL #1   Title Patient will increase Berg Balance score by > 6 points to demonstrate decreased fall risk during functional activities.    Time 8    Period Weeks    Status New    Target Date 05/05/20      PT LONG TERM GOAL #2   Title Patient will increase 10 meter walk test to >1.70m/s as to improve gait speed for better community  ambulation and to reduce fall risk.    Time 8    Period Weeks    Status New    Target Date 05/05/20      PT LONG TERM GOAL #3   Title Patient will reduce timed up and go to <11 seconds to reduce fall risk and demonstrate improved transfer/gait ability.    Time 8    Period Weeks    Status New    Target Date 05/05/20      PT LONG TERM GOAL #4   Title Patient will ascend/descend 4 stairs without rail assist independently without loss of balance to improve ability to get in/out of home.    Time 8    Period Weeks    Status New    Target Date 05/05/20                 Plan - 03/17/20 1505    Clinical Impression Statement Patient presents with weakness and slow gait speed. He is able to perform beginning standing exercises in open and closed chain with frequent rest periods that are long in duration due to fatigue.He has muscle fatigue with eccentric control.  He will continue to benefit from skilled PT to improve mobility and strength.    Personal Factors and Comorbidities Age    Stability/Clinical Decision Making Stable/Uncomplicated    Rehab Potential Good    PT Frequency 2x / week    PT Duration 8 weeks    PT Treatment/Interventions Stair training;Therapeutic activities;Therapeutic exercise;Patient/family education;Manual techniques;Passive range of motion;Neuromuscular re-education;Balance training    PT Home Exercise Plan balance and strengthening    Consulted and Agree with Plan of Care Patient;Family member/caregiver           Patient will benefit from skilled therapeutic intervention in order to improve the following deficits and impairments:  Abnormal gait, Decreased activity tolerance, Decreased endurance, Difficulty walking, Decreased balance, Decreased mobility  Visit Diagnosis: Muscle weakness (generalized)  Difficulty in walking, not elsewhere classified  Other abnormalities of gait and mobility     Problem List Patient Active Problem List   Diagnosis  Date Noted  . HFrEF (heart failure with reduced ejection fraction) ([place])   . Acute pulmonary edema ([place]) 11/18/2019  . Protein-calorie malnutrition, severe 09/27/2019  . Acute kidney failure, unspecified ([place])   . History of AAA (abdominal aortic aneurysm) repair   . Anemia   . Thrombocytopenia ([place])   . Acute kidney injury superimposed  on CKD ([place])   . Essential hypertension   . Hyperlipidemia   . Fatigue 09/19/2019  . AAA (abdominal aortic aneurysm) ([place]) 08/14/2019  . AAA (abdominal aortic aneurysm) without rupture ([place]) 08/01/2019  . PVD (peripheral vascular disease) ([place]) 07/24/2019  . Tobacco abuse 07/24/2019  . Raynaud's disease without gangrene 06/21/2019  . Mixed dyslipidemia 06/15/2018  . Family history of heart attack 06/15/2018  . Smokers' cough ([place]) 06/16/2017    Alanson Puls, PT DPT 03/17/2020, 3:30 PM  [place] MAIN [facility] SERVICES [address] [place], [place], [address] Phone: [phone]   Fax:  [phone]  Name: Philemon Riedesel MRN: [id number] Date of Birth: 1945/08/08

## 2020-03-18 DIAGNOSIS — N186 End stage renal disease: Secondary | ICD-10-CM | POA: Diagnosis not present

## 2020-03-18 DIAGNOSIS — Z992 Dependence on renal dialysis: Secondary | ICD-10-CM | POA: Diagnosis not present

## 2020-03-19 ENCOUNTER — Ambulatory Visit: Payer: Medicare Other | Admitting: Physical Therapy

## 2020-03-20 DIAGNOSIS — Z992 Dependence on renal dialysis: Secondary | ICD-10-CM | POA: Diagnosis not present

## 2020-03-20 DIAGNOSIS — N186 End stage renal disease: Secondary | ICD-10-CM | POA: Diagnosis not present

## 2020-03-22 DIAGNOSIS — N186 End stage renal disease: Secondary | ICD-10-CM | POA: Diagnosis not present

## 2020-03-22 DIAGNOSIS — Z992 Dependence on renal dialysis: Secondary | ICD-10-CM | POA: Diagnosis not present

## 2020-03-23 DIAGNOSIS — Z992 Dependence on renal dialysis: Secondary | ICD-10-CM | POA: Diagnosis not present

## 2020-03-23 DIAGNOSIS — N186 End stage renal disease: Secondary | ICD-10-CM | POA: Diagnosis not present

## 2020-03-24 ENCOUNTER — Ambulatory Visit: Payer: Medicare Other

## 2020-03-24 DIAGNOSIS — I739 Peripheral vascular disease, unspecified: Secondary | ICD-10-CM | POA: Diagnosis not present

## 2020-03-24 DIAGNOSIS — I714 Abdominal aortic aneurysm, without rupture: Secondary | ICD-10-CM | POA: Diagnosis not present

## 2020-03-24 DIAGNOSIS — Z9889 Other specified postprocedural states: Secondary | ICD-10-CM | POA: Diagnosis not present

## 2020-03-24 DIAGNOSIS — N186 End stage renal disease: Secondary | ICD-10-CM | POA: Diagnosis not present

## 2020-03-24 DIAGNOSIS — I5020 Unspecified systolic (congestive) heart failure: Secondary | ICD-10-CM | POA: Diagnosis not present

## 2020-03-25 DIAGNOSIS — Z992 Dependence on renal dialysis: Secondary | ICD-10-CM | POA: Diagnosis not present

## 2020-03-25 DIAGNOSIS — N186 End stage renal disease: Secondary | ICD-10-CM | POA: Diagnosis not present

## 2020-03-26 ENCOUNTER — Ambulatory Visit: Payer: Medicare Other

## 2020-03-27 DIAGNOSIS — N186 End stage renal disease: Secondary | ICD-10-CM | POA: Diagnosis not present

## 2020-03-27 DIAGNOSIS — Z992 Dependence on renal dialysis: Secondary | ICD-10-CM | POA: Diagnosis not present

## 2020-03-30 DIAGNOSIS — N186 End stage renal disease: Secondary | ICD-10-CM | POA: Diagnosis not present

## 2020-03-30 DIAGNOSIS — Z992 Dependence on renal dialysis: Secondary | ICD-10-CM | POA: Diagnosis not present

## 2020-03-30 DIAGNOSIS — N2581 Secondary hyperparathyroidism of renal origin: Secondary | ICD-10-CM | POA: Diagnosis not present

## 2020-03-31 ENCOUNTER — Ambulatory Visit: Payer: Medicare Other | Attending: Neurology

## 2020-04-01 DIAGNOSIS — N186 End stage renal disease: Secondary | ICD-10-CM | POA: Diagnosis not present

## 2020-04-01 DIAGNOSIS — Z992 Dependence on renal dialysis: Secondary | ICD-10-CM | POA: Diagnosis not present

## 2020-04-02 ENCOUNTER — Ambulatory Visit: Payer: Medicare Other

## 2020-04-02 DIAGNOSIS — J449 Chronic obstructive pulmonary disease, unspecified: Secondary | ICD-10-CM | POA: Diagnosis not present

## 2020-04-03 DIAGNOSIS — Z992 Dependence on renal dialysis: Secondary | ICD-10-CM | POA: Diagnosis not present

## 2020-04-03 DIAGNOSIS — N186 End stage renal disease: Secondary | ICD-10-CM | POA: Diagnosis not present

## 2020-04-06 DIAGNOSIS — Z992 Dependence on renal dialysis: Secondary | ICD-10-CM | POA: Diagnosis not present

## 2020-04-06 DIAGNOSIS — N186 End stage renal disease: Secondary | ICD-10-CM | POA: Diagnosis not present

## 2020-04-07 ENCOUNTER — Ambulatory Visit: Payer: Medicare Other

## 2020-04-07 DIAGNOSIS — Z87891 Personal history of nicotine dependence: Secondary | ICD-10-CM | POA: Diagnosis not present

## 2020-04-07 DIAGNOSIS — I5030 Unspecified diastolic (congestive) heart failure: Secondary | ICD-10-CM | POA: Diagnosis not present

## 2020-04-07 DIAGNOSIS — K4020 Bilateral inguinal hernia, without obstruction or gangrene, not specified as recurrent: Secondary | ICD-10-CM | POA: Diagnosis not present

## 2020-04-07 DIAGNOSIS — I2510 Atherosclerotic heart disease of native coronary artery without angina pectoris: Secondary | ICD-10-CM | POA: Diagnosis not present

## 2020-04-07 DIAGNOSIS — I132 Hypertensive heart and chronic kidney disease with heart failure and with stage 5 chronic kidney disease, or end stage renal disease: Secondary | ICD-10-CM | POA: Diagnosis not present

## 2020-04-07 DIAGNOSIS — Z7901 Long term (current) use of anticoagulants: Secondary | ICD-10-CM | POA: Diagnosis not present

## 2020-04-07 DIAGNOSIS — Z9889 Other specified postprocedural states: Secondary | ICD-10-CM | POA: Diagnosis not present

## 2020-04-07 DIAGNOSIS — Z8673 Personal history of transient ischemic attack (TIA), and cerebral infarction without residual deficits: Secondary | ICD-10-CM | POA: Diagnosis not present

## 2020-04-07 DIAGNOSIS — N186 End stage renal disease: Secondary | ICD-10-CM | POA: Diagnosis not present

## 2020-04-07 DIAGNOSIS — J810 Acute pulmonary edema: Secondary | ICD-10-CM | POA: Diagnosis not present

## 2020-04-07 DIAGNOSIS — I509 Heart failure, unspecified: Secondary | ICD-10-CM | POA: Diagnosis not present

## 2020-04-07 DIAGNOSIS — Z992 Dependence on renal dialysis: Secondary | ICD-10-CM | POA: Diagnosis not present

## 2020-04-09 ENCOUNTER — Ambulatory Visit: Payer: Medicare Other

## 2020-04-09 DIAGNOSIS — I120 Hypertensive chronic kidney disease with stage 5 chronic kidney disease or end stage renal disease: Secondary | ICD-10-CM | POA: Diagnosis not present

## 2020-04-09 DIAGNOSIS — N186 End stage renal disease: Secondary | ICD-10-CM | POA: Diagnosis not present

## 2020-04-09 DIAGNOSIS — Z992 Dependence on renal dialysis: Secondary | ICD-10-CM | POA: Diagnosis not present

## 2020-04-09 DIAGNOSIS — J410 Simple chronic bronchitis: Secondary | ICD-10-CM | POA: Diagnosis not present

## 2020-04-10 ENCOUNTER — Encounter: Payer: Self-pay | Admitting: Emergency Medicine

## 2020-04-10 ENCOUNTER — Observation Stay: Payer: Medicare Other

## 2020-04-10 ENCOUNTER — Emergency Department: Payer: Medicare Other

## 2020-04-10 ENCOUNTER — Observation Stay
Admission: EM | Admit: 2020-04-10 | Discharge: 2020-04-11 | Disposition: A | Discharge status: Home / Self Care | Payer: Medicare Other | Attending: Internal Medicine | Admitting: Internal Medicine

## 2020-04-10 ENCOUNTER — Other Ambulatory Visit: Payer: Self-pay

## 2020-04-10 DIAGNOSIS — R4182 Altered mental status, unspecified: Secondary | ICD-10-CM | POA: Diagnosis not present

## 2020-04-10 DIAGNOSIS — I10 Essential (primary) hypertension: Secondary | ICD-10-CM | POA: Diagnosis not present

## 2020-04-10 DIAGNOSIS — I132 Hypertensive heart and chronic kidney disease with heart failure and with stage 5 chronic kidney disease, or end stage renal disease: Secondary | ICD-10-CM | POA: Diagnosis not present

## 2020-04-10 DIAGNOSIS — R410 Disorientation, unspecified: Secondary | ICD-10-CM

## 2020-04-10 DIAGNOSIS — Z79899 Other long term (current) drug therapy: Secondary | ICD-10-CM | POA: Insufficient documentation

## 2020-04-10 DIAGNOSIS — Z720 Tobacco use: Secondary | ICD-10-CM | POA: Diagnosis present

## 2020-04-10 DIAGNOSIS — I714 Abdominal aortic aneurysm, without rupture, unspecified: Secondary | ICD-10-CM | POA: Diagnosis present

## 2020-04-10 DIAGNOSIS — R531 Weakness: Secondary | ICD-10-CM | POA: Diagnosis not present

## 2020-04-10 DIAGNOSIS — N186 End stage renal disease: Secondary | ICD-10-CM | POA: Diagnosis not present

## 2020-04-10 DIAGNOSIS — Z87891 Personal history of nicotine dependence: Secondary | ICD-10-CM | POA: Insufficient documentation

## 2020-04-10 DIAGNOSIS — E785 Hyperlipidemia, unspecified: Secondary | ICD-10-CM | POA: Diagnosis present

## 2020-04-10 DIAGNOSIS — Z743 Need for continuous supervision: Secondary | ICD-10-CM | POA: Diagnosis not present

## 2020-04-10 DIAGNOSIS — I5020 Unspecified systolic (congestive) heart failure: Secondary | ICD-10-CM | POA: Diagnosis not present

## 2020-04-10 DIAGNOSIS — E782 Mixed hyperlipidemia: Secondary | ICD-10-CM

## 2020-04-10 DIAGNOSIS — Z7901 Long term (current) use of anticoagulants: Secondary | ICD-10-CM | POA: Diagnosis not present

## 2020-04-10 DIAGNOSIS — Z20822 Contact with and (suspected) exposure to covid-19: Secondary | ICD-10-CM | POA: Diagnosis not present

## 2020-04-10 DIAGNOSIS — R55 Syncope and collapse: Secondary | ICD-10-CM | POA: Diagnosis not present

## 2020-04-10 DIAGNOSIS — I120 Hypertensive chronic kidney disease with stage 5 chronic kidney disease or end stage renal disease: Secondary | ICD-10-CM | POA: Diagnosis not present

## 2020-04-10 DIAGNOSIS — Z992 Dependence on renal dialysis: Secondary | ICD-10-CM | POA: Insufficient documentation

## 2020-04-10 DIAGNOSIS — R0600 Dyspnea, unspecified: Secondary | ICD-10-CM

## 2020-04-10 DIAGNOSIS — J3489 Other specified disorders of nose and nasal sinuses: Secondary | ICD-10-CM | POA: Diagnosis not present

## 2020-04-10 DIAGNOSIS — E43 Unspecified severe protein-calorie malnutrition: Secondary | ICD-10-CM | POA: Diagnosis present

## 2020-04-10 DIAGNOSIS — I5042 Chronic combined systolic (congestive) and diastolic (congestive) heart failure: Secondary | ICD-10-CM

## 2020-04-10 DIAGNOSIS — R404 Transient alteration of awareness: Secondary | ICD-10-CM | POA: Diagnosis not present

## 2020-04-10 DIAGNOSIS — R0902 Hypoxemia: Secondary | ICD-10-CM | POA: Diagnosis not present

## 2020-04-10 LAB — TROPONIN I (HIGH SENSITIVITY)
Troponin I (High Sensitivity): 37 ng/L — ABNORMAL HIGH (ref <18)
Troponin I (High Sensitivity): 34 ng/L — ABNORMAL HIGH (ref <18)

## 2020-04-10 LAB — BASIC METABOLIC PANEL
Anion gap: 13 (ref 5–15)
BUN: 63 mg/dL — ABNORMAL HIGH (ref 8–23)
CO2: 24 mmol/L (ref 22–32)
Calcium: 8.7 mg/dL — ABNORMAL LOW (ref 8.9–10.3)
Chloride: 101 mmol/L (ref 98–111)
Creatinine, Ser: 9.97 mg/dL — ABNORMAL HIGH (ref 0.61–1.24)
GFR, Estimated: 5 mL/min — ABNORMAL LOW (ref >60)
Glucose, Bld: 126 mg/dL — ABNORMAL HIGH (ref 70–99)
Potassium: 5.6 mmol/L — ABNORMAL HIGH (ref 3.5–5.1)
Sodium: 138 mmol/L (ref 135–145)

## 2020-04-10 LAB — RESP PANEL BY RT-PCR (FLU A&B, COVID) ARPGX2
Influenza A by PCR: NEGATIVE
Influenza B by PCR: NEGATIVE
SARS Coronavirus 2 by RT PCR: NEGATIVE

## 2020-04-10 LAB — CBC
HCT: 33.2 % — ABNORMAL LOW (ref 39.0–52.0)
Hemoglobin: 10.7 g/dL — ABNORMAL LOW (ref 13.0–17.0)
MCH: 33.6 pg (ref 26.0–34.0)
MCHC: 32.2 g/dL (ref 30.0–36.0)
MCV: 104.4 fL — ABNORMAL HIGH (ref 80.0–100.0)
Platelets: 103 10*3/uL — ABNORMAL LOW (ref 150–400)
RBC: 3.18 MIL/uL — ABNORMAL LOW (ref 4.22–5.81)
RDW: 15.2 % (ref 11.5–15.5)
WBC: 10.6 10*3/uL — ABNORMAL HIGH (ref 4.0–10.5)
nRBC: 0 % (ref 0.0–0.2)

## 2020-04-10 LAB — FIBRIN DERIVATIVES D-DIMER (ARMC ONLY): Fibrin derivatives D-dimer (ARMC): 3203.49 ng/mL (FEU) — ABNORMAL HIGH (ref 0.00–499.00)

## 2020-04-10 MED ORDER — RENA-VITE PO TABS
1.0000 | ORAL_TABLET | Freq: Every day | ORAL | Status: DC
  Administered 2020-04-10: 1 via ORAL
  Filled 2020-04-10 (×2): qty 1

## 2020-04-10 MED ORDER — IRBESARTAN 150 MG PO TABS
150.0000 mg | ORAL_TABLET | Freq: Every evening | ORAL | Status: DC
  Administered 2020-04-10: 150 mg via ORAL
  Filled 2020-04-10 (×2): qty 1

## 2020-04-10 MED ORDER — ALBUTEROL SULFATE HFA 108 (90 BASE) MCG/ACT IN AERS
2.0000 | INHALATION_SPRAY | Freq: Four times a day (QID) | RESPIRATORY_TRACT | Status: DC | PRN
  Filled 2020-04-10: qty 6.7

## 2020-04-10 MED ORDER — HYDRALAZINE HCL 50 MG PO TABS
50.0000 mg | ORAL_TABLET | Freq: Three times a day (TID) | ORAL | Status: DC
  Administered 2020-04-10 – 2020-04-11 (×2): 50 mg via ORAL
  Filled 2020-04-10 (×3): qty 1

## 2020-04-10 MED ORDER — CARVEDILOL 6.25 MG PO TABS
6.2500 mg | ORAL_TABLET | Freq: Two times a day (BID) | ORAL | Status: DC
  Administered 2020-04-11: 6.25 mg via ORAL
  Filled 2020-04-10: qty 1

## 2020-04-10 MED ORDER — HEPARIN SODIUM (PORCINE) 5000 UNIT/ML IJ SOLN
5000.0000 [IU] | Freq: Three times a day (TID) | INTRAMUSCULAR | Status: DC
  Administered 2020-04-10: 5000 [IU] via SUBCUTANEOUS
  Filled 2020-04-10: qty 1

## 2020-04-10 MED ORDER — ACETAMINOPHEN 500 MG PO TABS
500.0000 mg | ORAL_TABLET | Freq: Three times a day (TID) | ORAL | Status: DC | PRN
  Administered 2020-04-10 – 2020-04-11 (×3): 500 mg via ORAL
  Filled 2020-04-10 (×3): qty 1

## 2020-04-10 MED ORDER — BENZONATATE 100 MG PO CAPS: 100.0000 mg | ORAL_CAPSULE | Freq: Every evening | ORAL | Status: DC | PRN

## 2020-04-10 MED ORDER — TORSEMIDE 20 MG PO TABS
40.0000 mg | ORAL_TABLET | Freq: Every day | ORAL | Status: DC
  Administered 2020-04-11: 40 mg via ORAL
  Filled 2020-04-10: qty 2

## 2020-04-10 MED ORDER — ATORVASTATIN CALCIUM 20 MG PO TABS
40.0000 mg | ORAL_TABLET | Freq: Every day | ORAL | Status: DC
  Administered 2020-04-10 – 2020-04-11 (×2): 40 mg via ORAL
  Filled 2020-04-10: qty 2

## 2020-04-10 MED ORDER — GUAIFENESIN ER 600 MG PO TB12
600.0000 mg | ORAL_TABLET | Freq: Two times a day (BID) | ORAL | Status: DC | PRN
  Administered 2020-04-10 – 2020-04-11 (×2): 600 mg via ORAL
  Filled 2020-04-10 (×2): qty 1

## 2020-04-10 NOTE — H&P (Addendum)
History and Physical   Scott Oconnor IWP:[id number] DOB: Jun 14, 1945 DOA: 04/10/2020  PCP: [name], MD  Outpatient Specialists: Dr. Raul Del, [place] [facility] Patient coming from: HD center  I have personally briefly reviewed patient's old medical records in [place].  Chief Concern: mental status change   HPI: Scott Oconnor is a 74 y.o. male with medical history significant for combined heart failure, history of AAA without dissection status post endovascular repair, developed end-stage renal disease requiring hemodialysis after endovascular repair, developed thrombus in permacatheter, presented to the emergency department from hemodialysis center for chief concerns of mental status change/rreported syncopal.  Her daughter at bedside, patient did not pass out.  She states that while at hemodialysis center, patient fell asleep and it was noted that his SBP was 86. Does not know DBP. She does not know the heart rate at HD.  Patient reports that he was aware that nursing staff was gathered around him when he woke up.  She reports worsening productive cough since after surgery.  Review of system was negative for headache, vision changes, nausea, vomiting, dysuria, diarrhea, hematuria, blood in stool, fever, chills.  Patient endorses mild sore throat.  Procedure on Tuesday was for PD and hernias were seen and corrected.   HD started June 2021.   Chart review: March 2021 graft repair for AAA, then Cr increased and HD started in June 2021. June 2021 had flash pulmonary edema, bradycardia. EKG was fine. Stress test was normal. No blockage. Dr. [name] did echo which showed improved EF from June 2021.   Social history: lives at home. Endorses history of chronic tobacco abuse quit prior to AAA graft surgery, denies alcohol use, denies recreational drug use.  ED Course: Discussed with ED provider, admit for altered mental status  observation  Review of Systems: As per HPI otherwise 10 point review of systems negative.  Assessment/Plan  Principal Problem:   Altered mental status Active Problems:   Tobacco abuse   AAA (abdominal aortic aneurysm) ([facility])   Essential hypertension   Hyperlipidemia   Protein-calorie malnutrition, severe   HFrEF (heart failure with reduced ejection fraction) ([facility])   End stage renal disease on dialysis [facility])   Altered mental status-differential diagnosis work-up includes cardiology versus pulmonary versus dehydration secondary to GI loss due to GI prep prior to surgery resulting in multiple watery bowel movements and no p.o. intake -Trend troponins -Cardiology has been consulted and is aware, Dr Harrell Gave has added patient to their list -Checking D-dimer and found to be elevated, VQ scan has been ordered -A.m. team to consider possibility of discussing with nephrology for CTA to assess for PE -Patient has been on dialysis for less than 6 months, every effort is given to give the kidneys the best chance of recovery -Checking B12, TSH  Right IJ clot secondary to HD permcath -Patient was placed on Eliquis until removal of PermCath after PD cath placement has been placed and used -Daughter endorses patient was asked to stop Eliquis in anticipation of PD placement surgery on 11/14, 11/15, 11/16, and for day of surgery 11/17 -Per daughter patient resumed Eliquis on 11/18 and has been compliant even on day of presentation -Check D-dimer -VQ scan  Hyperkalemia-suspect secondary to missing hemodialysis session on 11/17 due to PD placement surgery and not completing full session on day of presentation, 04/10/2020  History of long-term tobacco abuse-quit prior to endovascular surgery to repair AAA  History of AAA without dissection status post endovascular repair  End-stage renal disease on hemodialysis-nephrology has been consulted - Currently using PermCath - Patient has PD catheter  and pending training on use   CAD-resumed home atorvastatin 40 mg nightly, carvedilol twice daily, lovastatin, torsemide  Smoker's cough with post laparoscopic surgery cough -Incentive spirometry every 1 hours while awake -Flutter valve every 2 hours while awake -Guaifenesin twice daily cough during the day as needed -Tessalon 100 mg p.o. as needed for cough at night  Protein malnutrition-severe, dietary has been consulted Diarrhea secondary to GI prep prior to surgery and immediately after surgery on 04/08/2020  DVT prophylaxis: on eliquis Code Status: Full code Diet: Renal Family Communication: Updated daughter at bedside Disposition Plan: Ending clinical course Consults called: Cardiology nephrology Admission status: Observation with telemetry  Past Medical History:  Diagnosis Date  . Hyperlipidemia   . Hypertension   . PVD (peripheral vascular disease) ([place])   . Reynolds syndrome [facility])    Past Surgical History:  Procedure Laterality Date  . DIALYSIS/PERMA CATHETER INSERTION N/A 09/26/2019   Procedure: DIALYSIS/PERMA CATHETER INSERTION;  Surgeon: Algernon Huxley, MD;  Location: [place] [facility] LAB;  Service: Cardiovascular;  Laterality: N/A;  . ENDOVASCULAR REPAIR/STENT GRAFT N/A 08/14/2019   Procedure: ENDOVASCULAR REPAIR/STENT GRAFT;  Surgeon: Katha Cabal, MD;  Location: [place] [facility];  Service: Cardiovascular;  Laterality: N/A;   Social History:  reports that he has quit smoking. His smoking use included cigarettes. He smoked 0.33 packs per day. He has never used smokeless tobacco. He reports that he does not drink alcohol and does not use drugs.  Allergies  Allergen Reactions  . Ivp Dye [Iodinated Diagnostic Agents]     ESRD patient    Family History  Problem Relation Age of Onset  . Heart disease Father   . Sudden Cardiac Death Father   . Heart disease Brother   . Hyperlipidemia Brother   . Heart disease Sister   . Heart disease Brother   .  Hyperlipidemia Brother   . Hyperlipidemia Brother   . Hyperlipidemia Brother   . Hyperlipidemia Brother   . Hyperlipidemia Brother   . Heart disease Sister    Family history: Family history reviewed and not pertinent for end-stage renal disease however sudden cardiac death in father  Prior to Admission medications   Medication Sig Start Date End Date Taking? Authorizing Provider  acetaminophen (TYLENOL) 500 MG tablet Take 500 mg by mouth every 8 (eight) hours as needed for moderate pain.     [provider]  albuterol (VENTOLIN HFA) 108 (90 Base) MCG/ACT inhaler Inhale 2 puffs into the lungs every 6 (six) hours as needed for wheezing or shortness of breath. 11/23/19   Pokhrel, Corrie Mckusick, MD  apixaban (ELIQUIS) 2.5 MG TABS tablet Take 1 tablet (2.5 mg total) by mouth 2 (two) times daily. Patient not taking: Reported on 02/19/2020 12/03/19   Loel Dubonnet, NP  atorvastatin (LIPITOR) 40 MG tablet Take 1 tablet (40 mg total) by mouth daily. 11/24/19   Pokhrel, Corrie Mckusick, MD  carvedilol (COREG) 6.25 MG tablet Take 1 tablet (6.25 mg total) by mouth 2 (two) times daily with a meal. 11/23/19   Pokhrel, [place], MD  docusate sodium (COLACE) 100 MG capsule Take 1 capsule (100 mg total) by mouth 2 (two) times daily as needed for mild constipation. 11/23/19   Pokhrel, Corrie Mckusick, MD  gabapentin (NEURONTIN) 100 MG capsule Take 100 mg by mouth at bedtime. 11/09/19   [provider]  guaiFENesin (MUCINEX) 600 MG 12 hr tablet  Take 1 tablet (600 mg total) by mouth 2 (two) times daily. 11/23/19 11/22/20  Pokhrel, Corrie Mckusick, MD  hydrALAZINE (APRESOLINE) 50 MG tablet Take 50 mg by mouth 3 (three) times daily.    [provider]  irbesartan (AVAPRO) 150 MG tablet Take 150 mg by mouth every evening.  10/22/19   [provider]  multivitamin (RENA-VIT) TABS tablet Take 1 tablet by mouth at bedtime. 09/30/19   Fritzi Mandes, MD  torsemide (DEMADEX) 20 MG tablet Take 2 tablets (40 mg total) by mouth daily.  11/23/19   Flora Lipps, MD   Physical Exam: Vitals:   04/10/20 1800 04/10/20 1900 04/10/20 2033 04/10/20 2346  BP: (!) 161/79 (!) 166/71 (!) 159/109 (!) 195/86  Pulse: (!) 59 61 67 76  Resp: 20 14 20 14   Temp:   98.4 F (36.9 C) 98 F (36.7 C)  TempSrc:   Oral Oral  SpO2: 100% 90% 100% 99%  Weight:      Height:       Constitutional: appears frail and cachectic, NAD, calm, comfortable Eyes: PERRL, lids and conjunctivae normal ENMT: Mucous membranes are moist. Posterior pharynx clear of any exudate or lesions. Age-appropriate dentition. Hearing appropriate Neck: normal, supple, no masses, no thyromegaly Respiratory: clear to auscultation bilaterally, no wheezing, no crackles. Normal respiratory effort. No accessory muscle use.  Cardiovascular: Regular rate and rhythm, no murmurs / rubs / gallops. No extremity edema. 2+ pedal pulses. No carotid bruits.  Right upper anterior chest wall PermCath present Abdomen: Mild tenderness with deep palpation of abdomen, no masses palpated, no hepatosplenomegaly. Bowel sounds positive.  Multiple suture sites consistent with with recent laparoscopic procedure, present of PD catheter appears clean Musculoskeletal: no clubbing / cyanosis. No joint deformity upper and lower extremities. Good ROM, no contractures, no atrophy. Normal muscle tone.  Skin: no rashes, lesions, ulcers. No induration Neurologic: CN 2-12 grossly intact. Sensation intact. Strength 5/5 in all 4.  Psychiatric: Normal judgment and insight. Alert and oriented x 3.  Depressed mood. Flat affect  EKG: Independently reviewed, showing irregular rate, rate of 67, LVH, QTC 481  Chest x-ray on Admission: Personally reviewed and I agree with radiologist reading as below.  CT HEAD WO CONTRAST  Result Date: 04/10/2020 CLINICAL DATA:  74 year old male with syncope. EXAM: CT HEAD WITHOUT CONTRAST TECHNIQUE: Contiguous axial images were obtained from the base of the skull through the vertex  without intravenous contrast. COMPARISON:  Head CT dated 11/19/2019. FINDINGS: Brain: Mild age-related atrophy and chronic microvascular ischemic changes. There is no acute intracranial hemorrhage. No mass effect or midline shift no extra-axial fluid collection. Vascular: No hyperdense vessel or unexpected calcification. Skull: Normal. Negative for fracture or focal lesion. Sinuses/Orbits: There is opacification of several ethmoid air cells. No air-fluid level. The mastoid air cells are clear. Other: None IMPRESSION: 1. No acute intracranial pathology. 2. Mild age-related atrophy and chronic microvascular ischemic changes. Electronically Signed   By: Anner Crete M.D.   On: 04/10/2020 20:28   Labs on Admission: I have personally reviewed following labs  CBC: Recent Labs  Lab 04/10/20 1555  WBC 10.6*  HGB 10.7*  HCT 33.2*  MCV 104.4*  PLT 272*   Basic Metabolic Panel: Recent Labs  Lab 04/10/20 1555  NA 138  K 5.6*  CL 101  CO2 24  GLUCOSE 126*  BUN 63*  CREATININE 9.97*  CALCIUM 8.7*   Urine analysis:    Component Value Date/Time   COLORURINE YELLOW (A) 09/20/2019 1418   APPEARANCEUR  CLEAR (A) 09/20/2019 1418   LABSPEC 1.009 09/20/2019 1418   PHURINE 6.0 09/20/2019 1418   GLUCOSEU NEGATIVE 09/20/2019 1418   HGBUR NEGATIVE 09/20/2019 [place] 09/20/2019 1418   BILIRUBINUR negative 09/19/2019 [place] 09/20/2019 1418   PROTEINUR >=300 (A) 09/20/2019 1418   UROBILINOGEN 0.2 09/19/2019 1029   NITRITE NEGATIVE 09/20/2019 1418   LEUKOCYTESUR NEGATIVE 09/20/2019 1418   Dallana Mavity N Shaquila Sigman D.O. [facility]  If 12AM-7AM, please contact overnight-coverage provider If 7AM-7PM, please contact day coverage provider [email or web address]  04/11/2020, 12:37 AM

## 2020-04-10 NOTE — ED Triage Notes (Signed)
Daughter with patient at this time states pt had PD catheter placed and 2 hernias repaired on Tuesday and has required more oxygen since that time.  Pt states during syncopal episode he remembers people talking to him and what was being said, but just that he was very tired.

## 2020-04-10 NOTE — ED Triage Notes (Signed)
First RN note: Pt to ED via [facility] from dialysis, per EMS pt was approx 1/2 way through dialysis when he had a syncopal episode, per EMS staff reports pt was not responding to staff, upon EMS arrival pt was alert and oriented to EMS.    155/74 60Hr 98% 4L (chronic 2L) CBG 134

## 2020-04-10 NOTE — ED Provider Notes (Signed)
[facility] Emergency Department Provider Note ____________________________________________   First MD Initiated Contact with Patient 04/10/20 1619     (approximate)  I have reviewed the triage vital signs and the nursing notes.  HISTORY  Chief Complaint Near Syncope   HPI Scott Oconnor is a 74 y.o. malewho presents to the ED for evaluation of a near syncopal episode.  Chart review indicates 11/16 PD catheter placed at the same time as multiple intra-abdominal hernias. Currently using a right chest permacath for hemodialysis. He was hypertensive during his admission and medications were uptitrated. On Eliquis due to right IJ clot from previous dialysis catheter. Follows with Dr. [name], cardiology. Known 5 cm AAA. Previous smoker. EF of 30%.  Patient presents to the ED with his daughter due to possible syncopal episode that occurred today during dialysis.  Daughter provides the majority of history and patient lives with her.  Daughter reports that ever since patient returned from his recent procedures, he has been more intermittently sleepy that she attributed to tramadol.  She denies any known fevers, emesis, diarrhea, increased coughing or known syncopal episodes.  She reports that he does occasionally fall asleep rather quickly throughout the day.  Patient reports he felt fine at dialysis, reports feeling "sleepy very quickly" and denies a discrete syncopal episode.  Denies any associated palpitations, chest pain, shortness of breath, headache, emesis or diaphoresis.  Patient reports he feels fine right now has no complaints.   Past Medical History:  Diagnosis Date  . Hyperlipidemia   . Hypertension   . PVD (peripheral vascular disease) ([place])   . Reynolds syndrome [facility])     Patient Active Problem List   Diagnosis Date Noted  . HFrEF (heart failure with reduced ejection fraction) ([place])   . Acute pulmonary edema ([place]) 11/18/2019  . Protein-calorie  malnutrition, severe 09/27/2019  . Acute kidney failure, unspecified ([place])   . History of AAA (abdominal aortic aneurysm) repair   . Anemia   . Thrombocytopenia ([place])   . Acute kidney injury superimposed on CKD ([place])   . Essential hypertension   . Hyperlipidemia   . Fatigue 09/19/2019  . AAA (abdominal aortic aneurysm) ([place]) 08/14/2019  . AAA (abdominal aortic aneurysm) without rupture ([place]) 08/01/2019  . PVD (peripheral vascular disease) ([place]) 07/24/2019  . Tobacco abuse 07/24/2019  . Raynaud's disease without gangrene 06/21/2019  . Mixed dyslipidemia 06/15/2018  . Family history of heart attack 06/15/2018  . Smokers' cough ([place]) 06/16/2017    Past Surgical History:  Procedure Laterality Date  . DIALYSIS/PERMA CATHETER INSERTION N/A 09/26/2019   Procedure: DIALYSIS/PERMA CATHETER INSERTION;  Surgeon: Algernon Huxley, MD;  Location: [place] CV LAB;  Service: Cardiovascular;  Laterality: N/A;  . ENDOVASCULAR REPAIR/STENT GRAFT N/A 08/14/2019   Procedure: ENDOVASCULAR REPAIR/STENT GRAFT;  Surgeon: Katha Cabal, MD;  Location: [place] CV LAB;  Service: Cardiovascular;  Laterality: N/A;    Prior to Admission medications   Medication Sig Start Date End Date Taking? Authorizing Provider  acetaminophen (TYLENOL) 500 MG tablet Take 500 mg by mouth every 8 (eight) hours as needed for moderate pain.     [provider]  albuterol (VENTOLIN HFA) 108 (90 Base) MCG/ACT inhaler Inhale 2 puffs into the lungs every 6 (six) hours as needed for wheezing or shortness of breath. 11/23/19   Pokhrel, Corrie Mckusick, MD  apixaban (ELIQUIS) 2.5 MG TABS tablet Take 1 tablet (2.5 mg total) by mouth 2 (two) times daily. Patient not taking: Reported on 02/19/2020 12/03/19  Loel Dubonnet, NP  atorvastatin (LIPITOR) 40 MG tablet Take 1 tablet (40 mg total) by mouth daily. 11/24/19   Pokhrel, Corrie Mckusick, MD  carvedilol (COREG) 6.25 MG tablet Take 1 tablet (6.25 mg total) by mouth 2 (two) times daily with  a meal. 11/23/19   Pokhrel, L[place], MD  docusate sodium (COLACE) 100 MG capsule Take 1 capsule (100 mg total) by mouth 2 (two) times daily as needed for mild constipation. 11/23/19   Pokhrel, Corrie Mckusick, MD  gabapentin (NEURONTIN) 100 MG capsule Take 100 mg by mouth at bedtime. 11/09/19   [provider]  guaiFENesin (MUCINEX) 600 MG 12 hr tablet Take 1 tablet (600 mg total) by mouth 2 (two) times daily. 11/23/19 11/22/20  Pokhrel, Corrie Mckusick, MD  hydrALAZINE (APRESOLINE) 50 MG tablet Take 50 mg by mouth 3 (three) times daily.    [provider]  irbesartan (AVAPRO) 150 MG tablet Take 150 mg by mouth every evening.  10/22/19   [provider]  multivitamin (RENA-VIT) TABS tablet Take 1 tablet by mouth at bedtime. 09/30/19   Fritzi Mandes, MD  torsemide (DEMADEX) 20 MG tablet Take 2 tablets (40 mg total) by mouth daily. 11/23/19   Pokhrel, Corrie Mckusick, MD    Allergies Ivp dye [iodinated diagnostic agents]  Family History  Problem Relation Age of Onset  . Heart disease Father   . Sudden Cardiac Death Father   . Heart disease Brother   . Hyperlipidemia Brother   . Heart disease Sister   . Heart disease Brother   . Hyperlipidemia Brother   . Hyperlipidemia Brother   . Hyperlipidemia Brother   . Hyperlipidemia Brother   . Hyperlipidemia Brother   . Heart disease Sister     Social History Social History   Tobacco Use  . Smoking status: Former Smoker    Packs/day: 0.33    Types: Cigarettes  . Smokeless tobacco: Never Used  . Tobacco comment: 6 cigarettes daily   Vaping Use  . Vaping Use: Never used  Substance Use Topics  . Alcohol use: No  . Drug use: No    Review of Systems  Constitutional: No fever/chills.  Positive generalized weakness and possible syncopal episode. Eyes: No visual changes. ENT: No sore throat. Cardiovascular: Denies chest pain. Respiratory: Denies shortness of breath. Gastrointestinal: No abdominal pain.  No nausea, no vomiting.  No diarrhea.  No  constipation. Genitourinary: Negative for dysuria. Musculoskeletal: Negative for back pain. Skin: Negative for rash. Neurological: Negative for headaches, focal weakness or numbness.  ____________________________________________   PHYSICAL EXAM:  VITAL SIGNS: Vitals:   04/10/20 1645 04/10/20 1700  BP:  (!) 160/70  Pulse: (!) 58 (!) 59  Resp: (!) 24 15  Temp:    SpO2: 100% 99%     Constitutional: Alert and oriented. Well appearing and in no acute distress.  Soft-spoken, but conversational in full sentences. Eyes: Conjunctivae are normal. PERRL. EOMI. Head: Atraumatic. Nose: No congestion/rhinnorhea. Mouth/Throat: Mucous membranes are moist.  Oropharynx non-erythematous. Neck: No stridor. No cervical spine tenderness to palpation. Cardiovascular: Normal rate, regular rhythm. Grossly normal heart sounds.  Good peripheral circulation. Respiratory: Normal respiratory effort.  No retractions. Lungs CTAB. Gastrointestinal: Soft , nondistended. No CVA tenderness. PD catheter in place to the right mid abdomen.  No erythema, induration or purulence. Musculoskeletal: No lower extremity tenderness nor edema.  No joint effusions. No signs of acute trauma. Neurologic:  Normal speech and language. No gross focal neurologic deficits are appreciated.  Skin:  Skin is warm, dry and  intact. No rash noted. Psychiatric: Mood and affect are normal. Speech and behavior are normal.  ____________________________________________   LABS (all labs ordered are listed, but only abnormal results are displayed)  Labs Reviewed  BASIC METABOLIC PANEL - Abnormal; Notable for the following components:      Result Value   Potassium 5.6 (*)    Glucose, Bld 126 (*)    BUN 63 (*)    Creatinine, Ser 9.97 (*)    Calcium 8.7 (*)    GFR, Estimated 5 (*)    All other components within normal limits  CBC - Abnormal; Notable for the following components:   WBC 10.6 (*)    RBC 3.18 (*)    Hemoglobin 10.7 (*)      HCT 33.2 (*)    MCV 104.4 (*)    Platelets 103 (*)    All other components within normal limits  FIBRIN DERIVATIVES D-DIMER ([facility] ONLY) - Abnormal; Notable for the following components:   Fibrin derivatives D-dimer ([facility]) 3,203.49 (*)    All other components within normal limits  TROPONIN I (HIGH SENSITIVITY) - Abnormal; Notable for the following components:   Troponin I (High Sensitivity) 37 (*)    All other components within normal limits  URINALYSIS, COMPLETE (UACMP) WITH MICROSCOPIC  CBG MONITORING, ED  TROPONIN I (HIGH SENSITIVITY)   ____________________________________________  12 Lead EKG  Sinus rhythm with sinus arrhythmia. Leftward axis. QTC 481. Upsloping ST elevations to the septal leads with chronic T wave inversions to lateral leads. New inferior inversions.  EKG from July reviewed with a normal sinus rhythm, chronic T wave inversions to lateral leads. Poor R wave progression. ____________________________________________  RADIOLOGY  ED MD interpretation: CTA chest pending at the time of admission.  Official radiology report(s): No results found.  ____________________________________________   PROCEDURES and INTERVENTIONS  Procedure(s) performed (including Critical Care):  .1-3 Lead EKG Interpretation Performed by: Vladimir Crofts, MD Authorized by: Vladimir Crofts, MD     Interpretation: abnormal     ECG rate:  90   ECG rate assessment: normal     Rhythm: sinus rhythm     Rhythm comment:  With sinus arrhythmia   Ectopy: PAC     Conduction: normal      Medications - No data to display  ____________________________________________   MDM / ED COURSE    74 year old dialysis patient presents to the ED after syncopal episode at dialysis, with EKG changes with a new multifocal atrial rhythm, requiring medical observation admission.  Hemodynamically stable without hypoxia.  Exam does not demonstrate any evidence of acute pathology.  He has a newly placed  PD catheter and a benign abdomen without evidence of acute infectious pathology around his recent procedural sites.  He reports being asymptomatic here in the ED, and only reports rapid onset sleepiness while at dialysis today.  With his new EKG changes, it is concerning for possible cardiac etiology of his syncope.  His troponin is low and has no evidence of ACS or NSTEMI at this time.  Elevated D-dimer in the setting of a syncopal episode concerning for possible PE, and so CTA chest was ordered and pending at the time of this writing and admission.  Admitting hospitalist agrees to follow-up in the study.  Discussed the case with cardiology, who recommends medical observation admission on telemetry.  Will admit to hospitalist medicine for further work-up and management.  Clinical Course as of Apr 10 1799  Fri Apr 10, 2020  1718 Speaking with Dr Harrell Gave.  No high grade block At least two atrial beat sources  Wandering atrial pacer?    [DS]  Fritz.Loose Spoke with admitting hospitalist, Dr. [name] who will see the patient for admission.  We discussed the possibility of acute PE with his elevated D-dimer.  I order CTA chest.  He has a documented "allergy" to iodinated contrast, this is not an allergy it is documented this because he is an ESRD patient.  He will get dialyzed during this admission, likely by tomorrow, no contraindications to contrast.   [DS]    Clinical Course User Index [DS] Vladimir Crofts, MD    ____________________________________________   FINAL CLINICAL IMPRESSION(S) / ED DIAGNOSES  Final diagnoses:  ESRD (end stage renal disease) on dialysis [facility])  Syncope, unspecified syncope type     ED Discharge Orders    None       Amariyah Bazar   Note:  This document was prepared using Dragon voice recognition software and may include unintentional dictation errors.   Vladimir Crofts, MD 04/10/20 [phone]

## 2020-04-10 NOTE — ED Notes (Signed)
Medication Reconciliation Report  For Home History Technicians  HIGHLIGHTS:  1. The patient WAS NOT personally interviewed 2. If not, what was the main source used: FAMILY/CAREGIVER TESTIMONY/DOCS 3. Does the patient appear to take any anti-coagulation agents (e.g. warfarin, Eliquis or Xarelto): YES 4. Does the patient appear to take any anti-convulsant agents (e.g. divalproex, levetiracetam or phenytoin): NO 5. Does the patient appear to use any insulin products (e.g. Lantus, Novolin or Humalog): NO 6. Does the patient appear to take any "beta-blockers" (e.g. metoprolol, carvedilol or bisoprolol: YES  BARRIERS:  1. Were there any barriers that prevented or complicated the medication reconciliation process: NO 2. If yes, what was the primary barrier encountered: None 3. Does the patient appear compliant with prescribed medications: YES 4. Does the patient express any barriers with compliance: NO 5. What is the primary barrier the patient reports: None   NOTES:[Include any concerns, remarks or complaints the patient expresses regarding medication therapy. Any observations or other information that might be useful to the treatment team can also be included. Immediate needs or concerns should be referred to the RN or appropriate member of the treatment team.]  Patient's family at bedside provided home medication information. [place] that patient NOT be ordered anything for sleep, pain (aside from acetaminophen) or cough (aside from guaifenesin).    Colen Darling, CPhT  at [facility]. [place], [place] [address] [phone]  ** The above is intended solely for informational and/or communicative purposes. It should in no way be considered an endorsement of any specific treatment, therapy or action. **

## 2020-04-11 ENCOUNTER — Other Ambulatory Visit: Payer: Medicare Other

## 2020-04-11 ENCOUNTER — Observation Stay: Payer: Medicare Other

## 2020-04-11 DIAGNOSIS — J449 Chronic obstructive pulmonary disease, unspecified: Secondary | ICD-10-CM | POA: Diagnosis not present

## 2020-04-11 DIAGNOSIS — N2581 Secondary hyperparathyroidism of renal origin: Secondary | ICD-10-CM | POA: Diagnosis not present

## 2020-04-11 DIAGNOSIS — J90 Pleural effusion, not elsewhere classified: Secondary | ICD-10-CM | POA: Diagnosis not present

## 2020-04-11 DIAGNOSIS — Z992 Dependence on renal dialysis: Secondary | ICD-10-CM | POA: Diagnosis not present

## 2020-04-11 DIAGNOSIS — R410 Disorientation, unspecified: Secondary | ICD-10-CM | POA: Diagnosis not present

## 2020-04-11 DIAGNOSIS — D631 Anemia in chronic kidney disease: Secondary | ICD-10-CM | POA: Diagnosis not present

## 2020-04-11 DIAGNOSIS — J811 Chronic pulmonary edema: Secondary | ICD-10-CM | POA: Diagnosis not present

## 2020-04-11 DIAGNOSIS — N186 End stage renal disease: Secondary | ICD-10-CM | POA: Diagnosis not present

## 2020-04-11 DIAGNOSIS — R0600 Dyspnea, unspecified: Secondary | ICD-10-CM | POA: Diagnosis not present

## 2020-04-11 DIAGNOSIS — I10 Essential (primary) hypertension: Secondary | ICD-10-CM | POA: Diagnosis not present

## 2020-04-11 DIAGNOSIS — E875 Hyperkalemia: Secondary | ICD-10-CM | POA: Diagnosis not present

## 2020-04-11 LAB — CBC
HCT: 30.6 % — ABNORMAL LOW (ref 39.0–52.0)
Hemoglobin: 10.2 g/dL — ABNORMAL LOW (ref 13.0–17.0)
MCH: 34.1 pg — ABNORMAL HIGH (ref 26.0–34.0)
MCHC: 33.3 g/dL (ref 30.0–36.0)
MCV: 102.3 fL — ABNORMAL HIGH (ref 80.0–100.0)
Platelets: 105 10*3/uL — ABNORMAL LOW (ref 150–400)
RBC: 2.99 MIL/uL — ABNORMAL LOW (ref 4.22–5.81)
RDW: 15.1 % (ref 11.5–15.5)
WBC: 10.6 10*3/uL — ABNORMAL HIGH (ref 4.0–10.5)
nRBC: 0 % (ref 0.0–0.2)

## 2020-04-11 LAB — BASIC METABOLIC PANEL
Anion gap: 14 (ref 5–15)
BUN: 73 mg/dL — ABNORMAL HIGH (ref 8–23)
CO2: 23 mmol/L (ref 22–32)
Calcium: 8.3 mg/dL — ABNORMAL LOW (ref 8.9–10.3)
Chloride: 102 mmol/L (ref 98–111)
Creatinine, Ser: 10.67 mg/dL — ABNORMAL HIGH (ref 0.61–1.24)
GFR, Estimated: 5 mL/min — ABNORMAL LOW (ref >60)
Glucose, Bld: 102 mg/dL — ABNORMAL HIGH (ref 70–99)
Potassium: 5.5 mmol/L — ABNORMAL HIGH (ref 3.5–5.1)
Sodium: 139 mmol/L (ref 135–145)

## 2020-04-11 LAB — TSH: TSH: 0.993 u[IU]/mL (ref 0.350–4.500)

## 2020-04-11 LAB — MRSA PCR SCREENING: MRSA by PCR: NEGATIVE

## 2020-04-11 LAB — VITAMIN B12: Vitamin B-12: 1108 pg/mL — ABNORMAL HIGH (ref 180–914)

## 2020-04-11 MED ORDER — SODIUM CHLORIDE 0.9 % IV SOLN: 100.0000 mL | INTRAVENOUS | Status: DC | PRN

## 2020-04-11 MED ORDER — LIDOCAINE HCL (PF) 1 % IJ SOLN
5.0000 mL | INTRAMUSCULAR | Status: DC | PRN
  Filled 2020-04-11: qty 5

## 2020-04-11 MED ORDER — CHLORHEXIDINE GLUCONATE CLOTH 2 % EX PADS: 6.0000 | MEDICATED_PAD | Freq: Every day | CUTANEOUS | Status: DC

## 2020-04-11 MED ORDER — EPOETIN ALFA 4000 UNIT/ML IJ SOLN
2000.0000 [IU] | INTRAMUSCULAR | Status: DC
  Administered 2020-04-11: 2000 [IU] via SUBCUTANEOUS
  Filled 2020-04-11: qty 0.5

## 2020-04-11 MED ORDER — SODIUM ZIRCONIUM CYCLOSILICATE 10 G PO PACK
10.0000 g | PACK | Freq: Once | ORAL | Status: DC
  Filled 2020-04-11: qty 1

## 2020-04-11 MED ORDER — HEPARIN SODIUM (PORCINE) 1000 UNIT/ML DIALYSIS: 20.0000 [IU]/kg | INTRAMUSCULAR | Status: DC | PRN

## 2020-04-11 MED ORDER — HEPARIN SODIUM (PORCINE) 1000 UNIT/ML DIALYSIS: 1000.0000 [IU] | INTRAMUSCULAR | Status: DC | PRN

## 2020-04-11 MED ORDER — ALTEPLASE 2 MG IJ SOLR: 2.0000 mg | Freq: Once | INTRAMUSCULAR | Status: DC | PRN

## 2020-04-11 MED ORDER — APIXABAN 2.5 MG PO TABS
2.5000 mg | ORAL_TABLET | Freq: Two times a day (BID) | ORAL | Status: DC
  Administered 2020-04-11 (×2): 2.5 mg via ORAL
  Filled 2020-04-11 (×2): qty 1

## 2020-04-11 MED ORDER — LIDOCAINE-PRILOCAINE 2.5-2.5 % EX CREA
1.0000 "application " | TOPICAL_CREAM | CUTANEOUS | Status: DC | PRN
  Filled 2020-04-11: qty 5

## 2020-04-11 MED ORDER — PENTAFLUOROPROP-TETRAFLUOROETH EX AERO
1.0000 "application " | INHALATION_SPRAY | CUTANEOUS | Status: DC | PRN
  Filled 2020-04-11: qty 30

## 2020-04-11 NOTE — Progress Notes (Signed)
Daughter did not bring O2 for patient to transport. Per daughter patient does not need O2. Only uses PRN at night at home.   Fuller Mandril, RN

## 2020-04-11 NOTE — Progress Notes (Signed)
Nigel Ericsson  MRN: [id number]  DOB/AGE: 73-Apr-1947 73 y.o.  Primary Care Physician:Kalisetti, Hart [place], MD  Admit date: 04/10/2020  Chief Complaint:  Chief Complaint  Patient presents with  . Near Syncope    S-Pt presented on  04/10/2020 with  Chief Complaint  Patient presents with  . Near Syncope  . Patient is a74 year old male with a past medical history of abdominal aortic aneurysm without dissection, s/p endovascular repair, developed ATN with cholesterol emboli requiring hemodialysis now ESRD who came to the ER with chief complaint of altered mental status.  History of present illness date back to yesterday when as per the patient daughter patient fell asleep but as per the data from the dialysis unit patient was in altered mental status and patient was sent to the ER. Upon evaluation in the ER patient was admitted for further work-up Patient seen today on second floor Patient offers no specific complaint. No complaint of chest pain/shortness of breath No complaint of fever cough or chills No complaint of recent Covid exposure No complaint of nausea vomiting diarrhea   Medications . apixaban  2.5 mg Oral BID  . atorvastatin  40 mg Oral Daily  . carvedilol  6.25 mg Oral BID WC  . Chlorhexidine Gluconate Cloth  6 each Topical Daily  . Chlorhexidine Gluconate Cloth  6 each Topical Q0600  . epoetin (EPOGEN/PROCRIT) injection  2,000 Units Subcutaneous Q M,W,F-HD  . hydrALAZINE  50 mg Oral TID  . irbesartan  150 mg Oral QPM  . multivitamin  1 tablet Oral QHS  . torsemide  40 mg Oral Daily         WNU:UVOZD from the symptoms mentioned above,there are no other symptoms referable to all systems reviewed.  Physical Exam: Vital signs in last 24 hours: Temp:  [97.2 F (36.2 C)-98.6 F (37 C)] 97.9 F (36.6 C) (11/20 1415) Pulse Rate:  [45-87] 87 (11/20 1415) Resp:  [11-24] 16 (11/20 1415) BP: (128-195)/(60-125) 185/91 (11/20 1415) SpO2:  [90 %-100 %] 90 %  (11/20 1415) Weight:  [55.3 kg] 55.3 kg (11/19 1530) Weight change:  Last BM Date: 04/10/20  Intake/Output from previous day: No intake/output data recorded. No intake/output data recorded.   Physical Exam:   General- pt is awake,alert, oriented to time place and person  Resp- No acute REsp distress, CTA B/L NO Rhonchi  CVS- S1S2 regular in rate and rhythm  GIT- BS+, soft, Non tender , Non distended  EXT- NO LE Edema, NO Cyanosis  Access-PD catheter and tunneled cath   Lab Results:  CBC  Recent Labs    04/10/20 1555 04/11/20 0508  WBC 10.6* 10.6*  HGB 10.7* 10.2*  HCT 33.2* 30.6*  PLT 103* 105*    BMET  Recent Labs    04/10/20 1555 04/11/20 0508  NA 138 139  K 5.6* 5.5*  CL 101 102  CO2 24 23  GLUCOSE 126* 102*  BUN 63* 73*  CREATININE 9.97* 10.67*  CALCIUM 8.7* 8.3*      Most recent Creatinine trend  Lab Results  Component Value Date   CREATININE 10.67 (H) 04/11/2020   CREATININE 9.97 (H) 04/10/2020   CREATININE 7.30 (H) 11/23/2019   CREATININE 7.55 (H) 11/23/2019      MICRO   Recent Results (from the past 240 hour(s))  Resp Panel by RT-PCR (Flu A&B, Covid) Nasopharyngeal Swab     Status: None   Collection Time: 04/10/20  6:50 PM   Specimen: Nasopharyngeal Swab; Nasopharyngeal(NP) swabs in vial transport medium  Result Value Ref Range Status   SARS Coronavirus 2 by RT PCR NEGATIVE NEGATIVE Final    Comment: (NOTE) SARS-CoV-2 target nucleic acids are NOT DETECTED.  The SARS-CoV-2 RNA is generally detectable in upper respiratory specimens during the acute phase of infection. The lowest concentration of SARS-CoV-2 viral copies this assay can detect is 138 copies/mL. A negative result does not preclude SARS-Cov-2 infection and should not be used as the sole basis for treatment or other patient management decisions. A negative result may occur with  improper specimen collection/handling, submission of specimen other than nasopharyngeal  swab, presence of viral mutation(s) within the areas targeted by this assay, and inadequate number of viral copies(<138 copies/mL). A negative result must be combined with clinical observations, patient history, and epidemiological information. The expected result is Negative.  Fact Sheet for Patients:  EntrepreneurPulse.com.au  Fact Sheet for Healthcare Providers:  IncredibleEmployment.be  This test is no t yet approved or cleared by the [place] FDA and  has been authorized for detection and/or diagnosis of SARS-CoV-2 by FDA under an Emergency Use Authorization (EUA). This EUA will remain  in effect (meaning this test can be used) for the duration of the COVID-19 declaration under Section 564(b)(1) of the Act, 21 U.S.C.section 360bbb-3(b)(1), unless the authorization is terminated  or revoked sooner.       Influenza A by PCR NEGATIVE NEGATIVE Final   Influenza B by PCR NEGATIVE NEGATIVE Final    Comment: (NOTE) The Xpert Xpress SARS-CoV-2/FLU/RSV plus assay is intended as an aid in the diagnosis of influenza from Nasopharyngeal swab specimens and should not be used as a sole basis for treatment. Nasal washings and aspirates are unacceptable for Xpert Xpress SARS-CoV-2/FLU/RSV testing.  Fact Sheet for Patients: EntrepreneurPulse.com.au  Fact Sheet for Healthcare Providers: IncredibleEmployment.be  This test is not yet approved or cleared by the [place] FDA and has been authorized for detection and/or diagnosis of SARS-CoV-2 by FDA under an Emergency Use Authorization (EUA). This EUA will remain in effect (meaning this test can be used) for the duration of the COVID-19 declaration under Section 564(b)(1) of the Act, 21 U.S.C. section 360bbb-3(b)(1), unless the authorization is terminated or revoked.  Performed at [facility], [address] [place], [place] [address]   MRSA PCR  Screening     Status: None   Collection Time: 04/11/20  5:43 AM   Specimen: Nasopharyngeal  Result Value Ref Range Status   MRSA by PCR NEGATIVE NEGATIVE Final    Comment:        The GeneXpert MRSA Assay (FDA approved for NASAL specimens only), is one component of a comprehensive MRSA colonization surveillance program. It is not intended to diagnose MRSA infection nor to guide or monitor treatment for MRSA infections. Performed at [facility], [address], [place], [place] [address]          Impression:   1)Renal    End-stage renal disease Patient is on hemodialysis. Patient is on Monday Wednesday Friday schedule Patient is now s/p PD catheter placement Patient has not transition to peritoneal dialysis yet Patient did receive dialysis yesterday Patient is currently hyperkalemic We will dialyze patient today  2)HTN    Blood pressure is stable    3)Anemia of chronic disease  CBC Latest Ref Rng & Units 04/11/2020 04/10/2020 11/23/2019  WBC 4.0 - 10.5 K/uL 10.6(H) 10.6(H) 10.2  Hemoglobin 13.0 - 17.0 g/dL 10.2(L) 10.7(L) 10.6(L)  Hematocrit 39 - 52 % 30.6(L) 33.2(L) 32.0(L)  Platelets  150 - 400 K/uL 105(L) 103(L) 161       HGb at goal (9--11)   4) Secondary hyperparathyroidism -CKD Mineral-Bone Disorder    Lab Results  Component Value Date   CALCIUM 8.3 (L) 04/11/2020   PHOS 8.2 (H) 11/23/2019   PHOS 8.2 (H) 11/23/2019    Secondary Hyperparathyroidism present Phosphorus is not at goal.   5) Altered mental status Primary team is following Now much better   6) Electrolytes   BMP Latest Ref Rng & Units 04/11/2020 04/10/2020 11/23/2019  Glucose 70 - 99 mg/dL 102(H) 126(H) 136(H)  BUN 8 - 23 mg/dL 73(H) 63(H) 62(H)  Creatinine 0.61 - 1.24 mg/dL 10.67(H) 9.97(H) 7.55(H)  BUN/Creat Ratio 6 - 22 (calc) - - -  Sodium 135 - 145 mmol/L 139 138 138  Potassium 3.5 - 5.1 mmol/L 5.5(H) 5.6(H) 3.6  Chloride 98 - 111 mmol/L 102 101 97(L)  CO2  22 - 32 mmol/L 23 24 24   Calcium 8.9 - 10.3 mg/dL 8.3(L) 8.7(L) 8.8(L)     Sodium Normonatremic   Potassium Hyperkalemia We will dialyze patient today We will use low K bath    7)Acid base  Co2 at goal      Plan:   We will dialyze patient today We will use low K bath   Addendum  Pt seen on HD Pt tolerating tx well.      Ansleigh Safer s Theador Hawthorne 04/11/2020, 2:52 PM

## 2020-04-11 NOTE — TOC Initial Note (Signed)
Transition of Care [facility]) - Initial/Assessment Note    Patient Details  Name: Scott Oconnor MRN: [id number] Date of Birth: 05/20/1946  Transition of Care [facility]) CM/SW Contact:    Harriet Masson, RN Phone [phone] 04/11/2020, 4:21 PM  Clinical Narrative:                  Receive a requested to assist pt with Eliquis. Pt no longer able to received from his provider's office. TOC RN able to provided Eliquis 30 days packet for assistance with this medication at bedside to the pt's daughter. Pt discharging today in the care of her daughter.  No additional needs.        Patient Goals and CMS Choice        Expected Discharge Plan and Services           Expected Discharge Date: 04/11/20                                    Prior Living Arrangements/Services                       Activities of Daily Living Home Assistive Devices/Equipment: Eyeglasses ADL Screening (condition at time of admission) Patient's cognitive ability adequate to safely complete daily activities?: Yes Is the patient deaf or have difficulty hearing?: No Does the patient have difficulty seeing, even when wearing glasses/contacts?: No Does the patient have difficulty concentrating, remembering, or making decisions?: No Patient able to express need for assistance with ADLs?: Yes Does the patient have difficulty dressing or bathing?: Yes Independently performs ADLs?: Yes (appropriate for developmental age) Does the patient have difficulty walking or climbing stairs?: No Weakness of Legs: Both Weakness of Arms/Hands: None  Permission Sought/Granted                  Emotional Assessment              Admission diagnosis:  Altered mental status [R41.82] ESRD (end stage renal disease) on dialysis ([place]) [N18.6, Z99.2] Syncope, unspecified syncope type [R55] Patient Active Problem List   Diagnosis Date Noted  . End stage renal disease on dialysis ([place])  04/11/2020  . Altered mental status 04/10/2020  . HFrEF (heart failure with reduced ejection fraction) ([place])   . Acute pulmonary edema ([place]) 11/18/2019  . Protein-calorie malnutrition, severe 09/27/2019  . Acute kidney failure, unspecified ([place])   . History of AAA (abdominal aortic aneurysm) repair   . Anemia   . Thrombocytopenia ([place])   . Acute kidney injury superimposed on CKD ([place])   . Essential hypertension   . Hyperlipidemia   . Fatigue 09/19/2019  . AAA (abdominal aortic aneurysm) ([place]) 08/14/2019  . AAA (abdominal aortic aneurysm) without rupture ([place]) 08/01/2019  . PVD (peripheral vascular disease) ([place]) 07/24/2019  . Tobacco abuse 07/24/2019  . Raynaud's disease without gangrene 06/21/2019  . Mixed dyslipidemia 06/15/2018  . Family history of heart attack 06/15/2018  . Smokers' cough ([place]) 06/16/2017   PCP:  [name], MD Pharmacy:   [facility] [phone] [address], [address] [place] [address] Phone: [phone] Fax: [phone]  [facility] #[id number] [address], [address] [place] [address] Phone: [phone] Fax: [phone]  Social Determinants of Health (SDOH) Interventions    Readmission Risk Interventions No flowsheet data found.

## 2020-04-11 NOTE — Discharge Summary (Signed)
Physician Discharge Summary  Scott Oconnor IHW:[id number] DOB: 01/29/1946 DOA: 04/10/2020  PCP: [name], MD  Admit date: 04/10/2020 Discharge date: 04/11/2020  Admitted From: home  Disposition:  Home   Recommendations for Outpatient Follow-up:  1. Follow up with PCP in 1-2 weeks 2. F/u w/ nephro in 1-2 weeks   Home Health: no  Equipment/Devices:   Discharge Condition: stable  CODE STATUS: full  Diet recommendation: Heart Healthy / renal   Brief/Interim Summary: HPI was taken from Dr. [name]: Scott Oconnor is a 74 y.o. male with medical history significant for combined heart failure, history of AAA without dissection status post endovascular repair, developed end-stage renal disease requiring hemodialysis after endovascular repair, developed thrombus in permacatheter, presented to the emergency department from hemodialysis center for chief concerns of mental status change/rreported syncopal.  Her daughter at bedside, patient did not pass out.  She states that while at hemodialysis center, patient fell asleep and it was noted that his SBP was 86. Does not know DBP. She does not know the heart rate at HD.  Patient reports that he was aware that nursing staff was gathered around him when he woke up.  She reports worsening productive cough since after surgery.  Review of system was negative for headache, vision changes, nausea, vomiting, dysuria, diarrhea, hematuria, blood in stool, fever, chills.  Patient endorses mild sore throat.  Procedure on Tuesday was for PD and hernias were seen and corrected.   HD started June 2021.   Chart review: March 2021 graft repair for AAA, then Cr increased and HD started in June 2021. June 2021 had flash pulmonary edema, bradycardia. EKG was fine. Stress test was normal. No blockage. Dr. [name] did echo which showed improved EF from June 2021.   Social history: lives at home. Endorses history of chronic tobacco abuse quit  prior to AAA graft surgery, denies alcohol use, denies recreational drug use.  ED Course: Discussed with ED provider, admit for altered mental status observation  Hospital course from Dr. [name] 04/11/20: Pt evidently presented w/ AMS likely secondary to taking narcotics at home. Pt does not usually take any narcotics at all but recently had surgery as per pt's daughter. Pt's mental status had returned to baseline when I saw the pt. Of note, pt was found to have elevated D-dimer but pt's daughter did not want the pt to have contrast so CTA chest was not possible. V/Q scan would not be helpful as pt's CXR is abnormal. Pt will continue on his home dose of eliquis. Pt received HD and was d/c home w/ family care after.   Discharge Diagnoses:  Principal Problem:   Altered mental status Active Problems:   Tobacco abuse   AAA (abdominal aortic aneurysm) (H[facility])   Essential hypertension   Hyperlipidemia   Protein-calorie malnutrition, severe   HFrEF (heart failure with reduced ejection fraction) (H[facility])   End stage renal disease on dialysis ([place]) Altered mental status: likely secondary to narcotic use. Pt does not usually take narcotics but recently had surgery as per pt's daughter. Mental status is back to baseline   Elevated d-dimer: VQ scan ordered but not a good test for pt's CXR is abnormal. [name] does not want the pt to have contrast so CTA chest not possible. Continue on eliquis   Right IJ clot: secondary to HD permcath. Continue eliquis. Daughter endorses patient was asked to stop eliquis in anticipation of PD placement surgery on 11/14, 11/15, 11/16, and for day of surgery 11/17 &  resumed Eliquis on 11/18 and has been compliant even on day of presentation  Hyperkalemia: suspect secondary to missing hemodialysis session on 11/17 due to PD placement surgery and not completing full session on day of presentation, 04/10/2020. Will be managed w/ HD today.   ESRD: on dialysis.  Management per nephro   Elevated troponins: minimally elevated and trending down. Likely secondary to demand ischemia  Thrombocytopenia: etiology unclear. Will continue to monitor   ACD: likely secondary to ESRD. No need for a transfusion at this time   Hx of smoking: quit prior to endovascular surgery to repair AAA  Hx of AAA: without dissection. S/p endovascular repair  CAD: continue on statin, carvedilol, torsemide   Protein malnutrition: severe. Nutrition was consulted   Diarrhea: secondary to GI prep. None today so far    Discharge Instructions  Discharge Instructions    Diet - low sodium heart healthy   Complete by: As directed    Renal diet as well   Discharge instructions   Complete by: As directed    F/u PCP in 1-2 weeks. F/u nephro, Dr. [name], in 1-2 weeks   Increase activity slowly   Complete by: As directed      Allergies as of 04/11/2020      Reactions   Ivp Dye [iodinated Diagnostic Agents]    ESRD patient       Medication List    TAKE these medications   acetaminophen 500 MG tablet Commonly known as: TYLENOL Take 500-1,000 mg by mouth every 8 (eight) hours as needed for mild pain or moderate pain.   albuterol 108 (90 Base) MCG/ACT inhaler Commonly known as: VENTOLIN HFA Inhale 2 puffs into the lungs every 6 (six) hours as needed for wheezing or shortness of breath.   apixaban 2.5 MG Tabs tablet Commonly known as: Eliquis Take 1 tablet (2.5 mg total) by mouth 2 (two) times daily.   atorvastatin 40 MG tablet Commonly known as: LIPITOR Take 1 tablet (40 mg total) by mouth daily. What changed: when to take this   calcium carbonate 500 MG chewable tablet Commonly known as: TUMS - dosed in mg elemental calcium Chew 1 tablet by mouth 3 (three) times daily after meals.   carvedilol 6.25 MG tablet Commonly known as: COREG Take 1 tablet (6.25 mg total) by mouth 2 (two) times daily with a meal.   docusate sodium 100 MG capsule Commonly known  as: COLACE Take 1 capsule (100 mg total) by mouth 2 (two) times daily as needed for mild constipation.   gabapentin 300 MG capsule Commonly known as: NEURONTIN Take 300 mg by mouth at bedtime.   guaiFENesin 600 MG 12 hr tablet Commonly known as: MUCINEX Take 600 mg by mouth 2 (two) times daily as needed for cough or to loosen phlegm.   hydrALAZINE 25 MG tablet Commonly known as: APRESOLINE Take 25 mg by mouth 3 (three) times daily.   irbesartan 150 MG tablet Commonly known as: AVAPRO Take 150 mg by mouth every evening.   multivitamin Tabs tablet Take 1 tablet by mouth at bedtime.   torsemide 20 MG tablet Commonly known as: DEMADEX Take 2 tablets (40 mg total) by mouth daily.       Allergies  Allergen Reactions  . Ivp Dye [Iodinated Diagnostic Agents]     ESRD patient     Consultations:  nephro    Procedures/Studies: CT HEAD WO CONTRAST  Result Date: 04/10/2020 CLINICAL DATA:  74 year old male with syncope. EXAM: CT HEAD WITHOUT  CONTRAST TECHNIQUE: Contiguous axial images were obtained from the base of the skull through the vertex without intravenous contrast. COMPARISON:  Head CT dated 11/19/2019. FINDINGS: Brain: Mild age-related atrophy and chronic microvascular ischemic changes. There is no acute intracranial hemorrhage. No mass effect or midline shift no extra-axial fluid collection. Vascular: No hyperdense vessel or unexpected calcification. Skull: Normal. Negative for fracture or focal lesion. Sinuses/Orbits: There is opacification of several ethmoid air cells. No air-fluid level. The mastoid air cells are clear. Other: None IMPRESSION: 1. No acute intracranial pathology. 2. Mild age-related atrophy and chronic microvascular ischemic changes. Electronically Signed   By: Anner Crete M.D.   On: 04/10/2020 20:28   DG Chest Port 1 View  Result Date: 04/11/2020 CLINICAL DATA:  Dyspnea.  Planned V/Q scan EXAM: PORTABLE CHEST 1 VIEW COMPARISON:  12/20/2019  FINDINGS: Partially obscured heart, normal size were covered. Dialysis catheter with tips at the SVC. Diffuse interstitial opacity with cephalized blood flow. Pleural effusion on the right more than left. There is a catheter over the abdomen which could be for peritoneal dialysis. Aortic stent graft which is minimally covered. IMPRESSION: Pulmonary edema and pleural effusions. Electronically Signed   By: Monte Fantasia M.D.   On: 04/11/2020 08:39      Subjective: Pt is AA&Ox4. Pt c/o fatigue otherwise pt denies any complaints    Discharge Exam: Vitals:   04/11/20 1345 04/11/20 1415  BP: (!) 164/86 (!) 185/91  Pulse: 79 87  Resp: 14 16  Temp:  97.9 F (36.6 C)  SpO2:  90%   Vitals:   04/11/20 1315 04/11/20 1330 04/11/20 1345 04/11/20 1415  BP: (!) 171/89 (!) 161/80 (!) 164/86 (!) 185/91  Pulse: 78 75 79 87  Resp: 14 20 14 16   Temp:    97.9 F (36.6 C)  TempSrc:    Oral  SpO2:    90%  Weight:      Height:        General: Pt is alert, awake, not in acute distress Cardiovascular: S1/S2 +, no rubs, no gallops Respiratory: CTA bilaterally, no wheezing, no rhonchi Abdominal: Soft, NT, ND, bowel sounds + Extremities:  no cyanosis    The results of significant diagnostics from this hospitalization (including imaging, microbiology, ancillary and laboratory) are listed below for reference.     Microbiology: Recent Results (from the past 240 hour(s))  Resp Panel by RT-PCR (Flu A&B, Covid) Nasopharyngeal Swab     Status: None   Collection Time: 04/10/20  6:50 PM   Specimen: Nasopharyngeal Swab; Nasopharyngeal(NP) swabs in vial transport medium  Result Value Ref Range Status   SARS Coronavirus 2 by RT PCR NEGATIVE NEGATIVE Final    Comment: (NOTE) SARS-CoV-2 target nucleic acids are NOT DETECTED.  The SARS-CoV-2 RNA is generally detectable in upper respiratory specimens during the acute phase of infection. The lowest concentration of SARS-CoV-2 viral copies this assay can  detect is 138 copies/mL. A negative result does not preclude SARS-Cov-2 infection and should not be used as the sole basis for treatment or other patient management decisions. A negative result may occur with  improper specimen collection/handling, submission of specimen other than nasopharyngeal swab, presence of viral mutation(s) within the areas targeted by this assay, and inadequate number of viral copies(<138 copies/mL). A negative result must be combined with clinical observations, patient history, and epidemiological information. The expected result is Negative.  Fact Sheet for Patients:  EntrepreneurPulse.com.au  Fact Sheet for Healthcare Providers:  IncredibleEmployment.be  This test is no  t yet approved or cleared by the [place] and  has been authorized for detection and/or diagnosis of SARS-CoV-2 by FDA under an Emergency Use Authorization (EUA). This EUA will remain  in effect (meaning this test can be used) for the duration of the COVID-19 declaration under Section 564(b)(1) of the Act, 21 U.S.C.section 360bbb-3(b)(1), unless the authorization is terminated  or revoked sooner.       Influenza A by PCR NEGATIVE NEGATIVE Final   Influenza B by PCR NEGATIVE NEGATIVE Final    Comment: (NOTE) The Xpert Xpress SARS-CoV-2/FLU/RSV plus assay is intended as an aid in the diagnosis of influenza from Nasopharyngeal swab specimens and should not be used as a sole basis for treatment. Nasal washings and aspirates are unacceptable for Xpert Xpress SARS-CoV-2/FLU/RSV testing.  Fact Sheet for Patients: EntrepreneurPulse.com.au  Fact Sheet for Healthcare Providers: IncredibleEmployment.be  This test is not yet approved or cleared by the [place] FDA and has been authorized for detection and/or diagnosis of SARS-CoV-2 by FDA under an Emergency Use Authorization (EUA). This EUA will remain in  effect (meaning this test can be used) for the duration of the COVID-19 declaration under Section 564(b)(1) of the Act, 21 U.S.C. section 360bbb-3(b)(1), unless the authorization is terminated or revoked.  Performed at [facility], [address] [place], [place] [address]   MRSA PCR Screening     Status: None   Collection Time: 04/11/20  5:43 AM   Specimen: Nasopharyngeal  Result Value Ref Range Status   MRSA by PCR NEGATIVE NEGATIVE Final    Comment:        The GeneXpert MRSA Assay (FDA approved for NASAL specimens only), is one component of a comprehensive MRSA colonization surveillance program. It is not intended to diagnose MRSA infection nor to guide or monitor treatment for MRSA infections. Performed at [facility], [address] [place], [place] [address]      Labs: BNP (last 3 results) Recent Labs    09/29/19 0642 11/18/19 2118  BNP 1,195.0* >1,540.0*   Basic Metabolic Panel: Recent Labs  Lab 04/10/20 1555 04/11/20 0508  NA 138 139  K 5.6* 5.5*  CL 101 102  CO2 24 23  GLUCOSE 126* 102*  BUN 63* 73*  CREATININE 9.97* 10.67*  CALCIUM 8.7* 8.3*   Liver Function Tests: No results for input(s): AST, ALT, ALKPHOS, BILITOT, PROT, ALBUMIN in the last 168 hours. No results for input(s): LIPASE, AMYLASE in the last 168 hours. No results for input(s): AMMONIA in the last 168 hours. CBC: Recent Labs  Lab 04/10/20 1555 04/11/20 0508  WBC 10.6* 10.6*  HGB 10.7* 10.2*  HCT 33.2* 30.6*  MCV 104.4* 102.3*  PLT 103* 105*   Cardiac Enzymes: No results for input(s): CKTOTAL, CKMB, CKMBINDEX, TROPONINI in the last 168 hours. BNP: Invalid input(s): POCBNP CBG: No results for input(s): GLUCAP in the last 168 hours. D-Dimer No results for input(s): DDIMER in the last 72 hours. Hgb A1c No results for input(s): HGBA1C in the last 72 hours. Lipid Profile No results for input(s): CHOL, HDL, LDLCALC, TRIG, CHOLHDL, LDLDIRECT in the last  72 hours. Thyroid function studies Recent Labs    04/11/20 0508  TSH 0.993   Anemia work up Recent Labs    04/11/20 0508  VITAMINB12 1,108*   Urinalysis    Component Value Date/Time   COLORURINE YELLOW (A) 09/20/2019 1418   APPEARANCEUR CLEAR (A) 09/20/2019 1418   LABSPEC 1.009 09/20/2019 1418   PHURINE 6.0  09/20/2019 [place] 09/20/2019 1418   HGBUR NEGATIVE 09/20/2019 [place] 09/20/2019 1418   BILIRUBINUR negative 09/19/2019 [place] 09/20/2019 1418   PROTEINUR >=300 (A) 09/20/2019 1418   UROBILINOGEN 0.2 09/19/2019 1029   NITRITE NEGATIVE 09/20/2019 1418   LEUKOCYTESUR NEGATIVE 09/20/2019 1418   Sepsis Labs Invalid input(s): PROCALCITONIN,  WBC,  LACTICIDVEN Microbiology Recent Results (from the past 240 hour(s))  Resp Panel by RT-PCR (Flu A&B, Covid) Nasopharyngeal Swab     Status: None   Collection Time: 04/10/20  6:50 PM   Specimen: Nasopharyngeal Swab; Nasopharyngeal(NP) swabs in vial transport medium  Result Value Ref Range Status   SARS Coronavirus 2 by RT PCR NEGATIVE NEGATIVE Final    Comment: (NOTE) SARS-CoV-2 target nucleic acids are NOT DETECTED.  The SARS-CoV-2 RNA is generally detectable in upper respiratory specimens during the acute phase of infection. The lowest concentration of SARS-CoV-2 viral copies this assay can detect is 138 copies/mL. A negative result does not preclude SARS-Cov-2 infection and should not be used as the sole basis for treatment or other patient management decisions. A negative result may occur with  improper specimen collection/handling, submission of specimen other than nasopharyngeal swab, presence of viral mutation(s) within the areas targeted by this assay, and inadequate number of viral copies(<138 copies/mL). A negative result must be combined with clinical observations, patient history, and epidemiological information. The expected result is Negative.  Fact  Sheet for Patients:  EntrepreneurPulse.com.au  Fact Sheet for Healthcare Providers:  IncredibleEmployment.be  This test is no t yet approved or cleared by the [place] FDA and  has been authorized for detection and/or diagnosis of SARS-CoV-2 by FDA under an Emergency Use Authorization (EUA). This EUA will remain  in effect (meaning this test can be used) for the duration of the COVID-19 declaration under Section 564(b)(1) of the Act, 21 U.S.C.section 360bbb-3(b)(1), unless the authorization is terminated  or revoked sooner.       Influenza A by PCR NEGATIVE NEGATIVE Final   Influenza B by PCR NEGATIVE NEGATIVE Final    Comment: (NOTE) The Xpert Xpress SARS-CoV-2/FLU/RSV plus assay is intended as an aid in the diagnosis of influenza from Nasopharyngeal swab specimens and should not be used as a sole basis for treatment. Nasal washings and aspirates are unacceptable for Xpert Xpress SARS-CoV-2/FLU/RSV testing.  Fact Sheet for Patients: EntrepreneurPulse.com.au  Fact Sheet for Healthcare Providers: IncredibleEmployment.be  This test is not yet approved or cleared by the [place] FDA and has been authorized for detection and/or diagnosis of SARS-CoV-2 by FDA under an Emergency Use Authorization (EUA). This EUA will remain in effect (meaning this test can be used) for the duration of the COVID-19 declaration under Section 564(b)(1) of the Act, 21 U.S.C. section 360bbb-3(b)(1), unless the authorization is terminated or revoked.  Performed at [facility], [address] [place], [place] [address]   MRSA PCR Screening     Status: None   Collection Time: 04/11/20  5:43 AM   Specimen: Nasopharyngeal  Result Value Ref Range Status   MRSA by PCR NEGATIVE NEGATIVE Final    Comment:        The GeneXpert MRSA Assay (FDA approved for NASAL specimens only), is one component of a comprehensive  MRSA colonization surveillance program. It is not intended to diagnose MRSA infection nor to guide or monitor treatment for MRSA infections. Performed at [facility], [address], [place], [place] [address]  Time coordinating discharge: Over 30 minutes  SIGNED:   Wyvonnia Dusky, MD  [facility] 04/11/2020, 2:29 PM Pager   If 7PM-7AM, please contact night-coverage

## 2020-04-11 NOTE — Progress Notes (Signed)
Scott Oconnor to be D/C'd Home per MD order.  Discussed prescriptions and follow up appointments with the patient. Prescriptions given to patient, medication list explained in detail. Pt verbalized understanding.  Allergies as of 04/11/2020      Reactions   Ivp Dye [iodinated Diagnostic Agents]    ESRD patient       Medication List    TAKE these medications   acetaminophen 500 MG tablet Commonly known as: TYLENOL Take 500-1,000 mg by mouth every 8 (eight) hours as needed for mild pain or moderate pain.   albuterol 108 (90 Base) MCG/ACT inhaler Commonly known as: VENTOLIN HFA Inhale 2 puffs into the lungs every 6 (six) hours as needed for wheezing or shortness of breath.   apixaban 2.5 MG Tabs tablet Commonly known as: Eliquis Take 1 tablet (2.5 mg total) by mouth 2 (two) times daily.   atorvastatin 40 MG tablet Commonly known as: LIPITOR Take 1 tablet (40 mg total) by mouth daily. What changed: when to take this   calcium carbonate 500 MG chewable tablet Commonly known as: TUMS - dosed in mg elemental calcium Chew 1 tablet by mouth 3 (three) times daily after meals.   carvedilol 6.25 MG tablet Commonly known as: COREG Take 1 tablet (6.25 mg total) by mouth 2 (two) times daily with a meal.   docusate sodium 100 MG capsule Commonly known as: COLACE Take 1 capsule (100 mg total) by mouth 2 (two) times daily as needed for mild constipation.   gabapentin 300 MG capsule Commonly known as: NEURONTIN Take 300 mg by mouth at bedtime.   guaiFENesin 600 MG 12 hr tablet Commonly known as: MUCINEX Take 600 mg by mouth 2 (two) times daily as needed for cough or to loosen phlegm.   hydrALAZINE 25 MG tablet Commonly known as: APRESOLINE Take 25 mg by mouth 3 (three) times daily.   irbesartan 150 MG tablet Commonly known as: AVAPRO Take 150 mg by mouth every evening.   multivitamin Tabs tablet Take 1 tablet by mouth at bedtime.   torsemide 20 MG tablet Commonly known  as: DEMADEX Take 2 tablets (40 mg total) by mouth daily.       Vitals:   04/11/20 1415 04/11/20 1542  BP: (!) 185/91 (!) 156/79  Pulse: 87   Resp: 16   Temp: 97.9 F (36.6 C)   SpO2: 90%     Skin clean, dry and intact without evidence of skin break down, no evidence of skin tears noted. IV catheter discontinued intact. Site without signs and symptoms of complications. Dressing and pressure applied. Pt denies pain at this time. No complaints noted.  An After Visit Summary was printed and given to the patient. Patient escorted via [place], and D/C home via private auto.  Fuller Mandril, RN

## 2020-04-13 DIAGNOSIS — N186 End stage renal disease: Secondary | ICD-10-CM | POA: Diagnosis not present

## 2020-04-13 DIAGNOSIS — Z992 Dependence on renal dialysis: Secondary | ICD-10-CM | POA: Diagnosis not present

## 2020-04-13 DIAGNOSIS — D649 Anemia, unspecified: Secondary | ICD-10-CM | POA: Diagnosis not present

## 2020-04-13 DIAGNOSIS — Z79899 Other long term (current) drug therapy: Secondary | ICD-10-CM | POA: Diagnosis not present

## 2020-04-14 ENCOUNTER — Ambulatory Visit: Payer: Medicare Other

## 2020-04-15 DIAGNOSIS — N186 End stage renal disease: Secondary | ICD-10-CM | POA: Diagnosis not present

## 2020-04-15 DIAGNOSIS — Z992 Dependence on renal dialysis: Secondary | ICD-10-CM | POA: Diagnosis not present

## 2020-04-16 LAB — CULTURE, BLOOD (ROUTINE X 2)
Culture: NO GROWTH
Culture: NO GROWTH
Special Requests: ADEQUATE

## 2020-04-17 DIAGNOSIS — Z992 Dependence on renal dialysis: Secondary | ICD-10-CM | POA: Diagnosis not present

## 2020-04-17 DIAGNOSIS — N186 End stage renal disease: Secondary | ICD-10-CM | POA: Diagnosis not present

## 2020-04-20 DIAGNOSIS — Z992 Dependence on renal dialysis: Secondary | ICD-10-CM | POA: Diagnosis not present

## 2020-04-20 DIAGNOSIS — N186 End stage renal disease: Secondary | ICD-10-CM | POA: Diagnosis not present

## 2020-04-21 ENCOUNTER — Ambulatory Visit: Payer: Medicare Other

## 2020-04-21 DIAGNOSIS — Z992 Dependence on renal dialysis: Secondary | ICD-10-CM | POA: Diagnosis not present

## 2020-04-21 DIAGNOSIS — N186 End stage renal disease: Secondary | ICD-10-CM | POA: Diagnosis not present

## 2020-04-22 DIAGNOSIS — N186 End stage renal disease: Secondary | ICD-10-CM | POA: Diagnosis not present

## 2020-04-22 DIAGNOSIS — Z992 Dependence on renal dialysis: Secondary | ICD-10-CM | POA: Diagnosis not present

## 2020-04-23 ENCOUNTER — Ambulatory Visit: Payer: Medicare Other

## 2020-04-24 DIAGNOSIS — N186 End stage renal disease: Secondary | ICD-10-CM | POA: Diagnosis not present

## 2020-04-24 DIAGNOSIS — Z992 Dependence on renal dialysis: Secondary | ICD-10-CM | POA: Diagnosis not present

## 2020-04-27 DIAGNOSIS — N186 End stage renal disease: Secondary | ICD-10-CM | POA: Diagnosis not present

## 2020-04-27 DIAGNOSIS — Z992 Dependence on renal dialysis: Secondary | ICD-10-CM | POA: Diagnosis not present

## 2020-04-28 ENCOUNTER — Ambulatory Visit: Payer: Medicare Other

## 2020-04-29 DIAGNOSIS — N186 End stage renal disease: Secondary | ICD-10-CM | POA: Diagnosis not present

## 2020-04-29 DIAGNOSIS — Z992 Dependence on renal dialysis: Secondary | ICD-10-CM | POA: Diagnosis not present

## 2020-04-30 ENCOUNTER — Ambulatory Visit: Payer: Medicare Other

## 2020-05-01 DIAGNOSIS — Z992 Dependence on renal dialysis: Secondary | ICD-10-CM | POA: Diagnosis not present

## 2020-05-01 DIAGNOSIS — N186 End stage renal disease: Secondary | ICD-10-CM | POA: Diagnosis not present

## 2020-05-02 DIAGNOSIS — J449 Chronic obstructive pulmonary disease, unspecified: Secondary | ICD-10-CM | POA: Diagnosis not present

## 2020-05-04 DIAGNOSIS — N186 End stage renal disease: Secondary | ICD-10-CM | POA: Diagnosis not present

## 2020-05-04 DIAGNOSIS — Z992 Dependence on renal dialysis: Secondary | ICD-10-CM | POA: Diagnosis not present

## 2020-05-04 DIAGNOSIS — N2581 Secondary hyperparathyroidism of renal origin: Secondary | ICD-10-CM | POA: Diagnosis not present

## 2020-05-05 ENCOUNTER — Ambulatory Visit: Payer: Medicare Other

## 2020-05-06 ENCOUNTER — Other Ambulatory Visit: Payer: Self-pay

## 2020-05-06 ENCOUNTER — Emergency Department: Payer: Medicare Other

## 2020-05-06 ENCOUNTER — Inpatient Hospital Stay
Admission: EM | Admit: 2020-05-06 | Discharge: 2020-05-06 | DRG: 291 | Disposition: A | Discharge status: Home / Self Care | Payer: Medicare Other | Attending: Internal Medicine | Admitting: Internal Medicine

## 2020-05-06 DIAGNOSIS — E785 Hyperlipidemia, unspecified: Secondary | ICD-10-CM | POA: Diagnosis present

## 2020-05-06 DIAGNOSIS — I132 Hypertensive heart and chronic kidney disease with heart failure and with stage 5 chronic kidney disease, or end stage renal disease: Secondary | ICD-10-CM | POA: Diagnosis not present

## 2020-05-06 DIAGNOSIS — D72829 Elevated white blood cell count, unspecified: Secondary | ICD-10-CM | POA: Diagnosis present

## 2020-05-06 DIAGNOSIS — J811 Chronic pulmonary edema: Secondary | ICD-10-CM | POA: Diagnosis not present

## 2020-05-06 DIAGNOSIS — J9600 Acute respiratory failure, unspecified whether with hypoxia or hypercapnia: Secondary | ICD-10-CM | POA: Diagnosis not present

## 2020-05-06 DIAGNOSIS — I10 Essential (primary) hypertension: Secondary | ICD-10-CM | POA: Diagnosis present

## 2020-05-06 DIAGNOSIS — Z87891 Personal history of nicotine dependence: Secondary | ICD-10-CM

## 2020-05-06 DIAGNOSIS — Z8249 Family history of ischemic heart disease and other diseases of the circulatory system: Secondary | ICD-10-CM | POA: Diagnosis not present

## 2020-05-06 DIAGNOSIS — I248 Other forms of acute ischemic heart disease: Secondary | ICD-10-CM | POA: Diagnosis present

## 2020-05-06 DIAGNOSIS — R0602 Shortness of breath: Secondary | ICD-10-CM | POA: Diagnosis not present

## 2020-05-06 DIAGNOSIS — Z992 Dependence on renal dialysis: Secondary | ICD-10-CM | POA: Diagnosis not present

## 2020-05-06 DIAGNOSIS — Z83438 Family history of other disorder of lipoprotein metabolism and other lipidemia: Secondary | ICD-10-CM | POA: Diagnosis not present

## 2020-05-06 DIAGNOSIS — Z8679 Personal history of other diseases of the circulatory system: Secondary | ICD-10-CM | POA: Diagnosis not present

## 2020-05-06 DIAGNOSIS — I120 Hypertensive chronic kidney disease with stage 5 chronic kidney disease or end stage renal disease: Secondary | ICD-10-CM | POA: Diagnosis not present

## 2020-05-06 DIAGNOSIS — R000 Tachycardia, unspecified: Secondary | ICD-10-CM | POA: Diagnosis not present

## 2020-05-06 DIAGNOSIS — N186 End stage renal disease: Secondary | ICD-10-CM | POA: Diagnosis not present

## 2020-05-06 DIAGNOSIS — Z9889 Other specified postprocedural states: Secondary | ICD-10-CM

## 2020-05-06 DIAGNOSIS — I161 Hypertensive emergency: Secondary | ICD-10-CM | POA: Diagnosis not present

## 2020-05-06 DIAGNOSIS — I739 Peripheral vascular disease, unspecified: Secondary | ICD-10-CM | POA: Diagnosis not present

## 2020-05-06 DIAGNOSIS — Z86718 Personal history of other venous thrombosis and embolism: Secondary | ICD-10-CM

## 2020-05-06 DIAGNOSIS — J810 Acute pulmonary edema: Secondary | ICD-10-CM | POA: Diagnosis present

## 2020-05-06 DIAGNOSIS — I5043 Acute on chronic combined systolic (congestive) and diastolic (congestive) heart failure: Secondary | ICD-10-CM | POA: Diagnosis present

## 2020-05-06 DIAGNOSIS — Z79899 Other long term (current) drug therapy: Secondary | ICD-10-CM | POA: Diagnosis not present

## 2020-05-06 DIAGNOSIS — J90 Pleural effusion, not elsewhere classified: Secondary | ICD-10-CM | POA: Diagnosis not present

## 2020-05-06 DIAGNOSIS — D631 Anemia in chronic kidney disease: Secondary | ICD-10-CM | POA: Diagnosis not present

## 2020-05-06 DIAGNOSIS — J9601 Acute respiratory failure with hypoxia: Secondary | ICD-10-CM

## 2020-05-06 DIAGNOSIS — Z20822 Contact with and (suspected) exposure to covid-19: Secondary | ICD-10-CM | POA: Diagnosis present

## 2020-05-06 DIAGNOSIS — N2581 Secondary hyperparathyroidism of renal origin: Secondary | ICD-10-CM | POA: Diagnosis not present

## 2020-05-06 DIAGNOSIS — R778 Other specified abnormalities of plasma proteins: Secondary | ICD-10-CM

## 2020-05-06 DIAGNOSIS — I509 Heart failure, unspecified: Secondary | ICD-10-CM

## 2020-05-06 DIAGNOSIS — Z7901 Long term (current) use of anticoagulants: Secondary | ICD-10-CM

## 2020-05-06 DIAGNOSIS — N185 Chronic kidney disease, stage 5: Secondary | ICD-10-CM

## 2020-05-06 DIAGNOSIS — I5022 Chronic systolic (congestive) heart failure: Secondary | ICD-10-CM

## 2020-05-06 DIAGNOSIS — I340 Nonrheumatic mitral (valve) insufficiency: Secondary | ICD-10-CM | POA: Diagnosis not present

## 2020-05-06 DIAGNOSIS — R7989 Other specified abnormal findings of blood chemistry: Secondary | ICD-10-CM

## 2020-05-06 HISTORY — DX: Disorder of kidney and ureter, unspecified: N28.9

## 2020-05-06 LAB — COMPREHENSIVE METABOLIC PANEL
ALT: 13 U/L (ref 0–44)
AST: 26 U/L (ref 15–41)
Albumin: 4 g/dL (ref 3.5–5.0)
Alkaline Phosphatase: 97 U/L (ref 38–126)
Anion gap: 15 (ref 5–15)
BUN: 41 mg/dL — ABNORMAL HIGH (ref 8–23)
CO2: 24 mmol/L (ref 22–32)
Calcium: 8.9 mg/dL (ref 8.9–10.3)
Chloride: 106 mmol/L (ref 98–111)
Creatinine, Ser: 6.77 mg/dL — ABNORMAL HIGH (ref 0.61–1.24)
GFR, Estimated: 8 mL/min — ABNORMAL LOW (ref >60)
Glucose, Bld: 170 mg/dL — ABNORMAL HIGH (ref 70–99)
Potassium: 5 mmol/L (ref 3.5–5.1)
Sodium: 145 mmol/L (ref 135–145)
Total Bilirubin: 0.8 mg/dL (ref 0.3–1.2)
Total Protein: 7.9 g/dL (ref 6.5–8.1)

## 2020-05-06 LAB — CBC WITH DIFFERENTIAL/PLATELET
Abs Immature Granulocytes: 0.03 10*3/uL (ref 0.00–0.07)
Basophils Absolute: 0.1 10*3/uL (ref 0.0–0.1)
Basophils Relative: 1 %
Eosinophils Absolute: 1.4 10*3/uL — ABNORMAL HIGH (ref 0.0–0.5)
Eosinophils Relative: 11 %
HCT: 38.2 % — ABNORMAL LOW (ref 39.0–52.0)
Hemoglobin: 12 g/dL — ABNORMAL LOW (ref 13.0–17.0)
Immature Granulocytes: 0 %
Lymphocytes Relative: 36 %
Lymphs Abs: 4.6 10*3/uL — ABNORMAL HIGH (ref 0.7–4.0)
MCH: 34.4 pg — ABNORMAL HIGH (ref 26.0–34.0)
MCHC: 31.4 g/dL (ref 30.0–36.0)
MCV: 109.5 fL — ABNORMAL HIGH (ref 80.0–100.0)
Monocytes Absolute: 0.8 10*3/uL (ref 0.1–1.0)
Monocytes Relative: 7 %
Neutro Abs: 5.7 10*3/uL (ref 1.7–7.7)
Neutrophils Relative %: 45 %
Platelets: 154 10*3/uL (ref 150–400)
RBC: 3.49 MIL/uL — ABNORMAL LOW (ref 4.22–5.81)
RDW: 16 % — ABNORMAL HIGH (ref 11.5–15.5)
WBC: 12.8 10*3/uL — ABNORMAL HIGH (ref 4.0–10.5)
nRBC: 0 % (ref 0.0–0.2)

## 2020-05-06 LAB — TROPONIN I (HIGH SENSITIVITY)
Troponin I (High Sensitivity): 54 ng/L — ABNORMAL HIGH (ref <18)
Troponin I (High Sensitivity): 159 ng/L (ref <18)

## 2020-05-06 LAB — HEPATITIS B SURFACE ANTIGEN: Hepatitis B Surface Ag: NONREACTIVE

## 2020-05-06 LAB — BRAIN NATRIURETIC PEPTIDE: B Natriuretic Peptide: >4500 pg/mL — ABNORMAL HIGH (ref 0.0–100.0)

## 2020-05-06 LAB — MAGNESIUM: Magnesium: 2.5 mg/dL — ABNORMAL HIGH (ref 1.7–2.4)

## 2020-05-06 LAB — PROTIME-INR
INR: 1.4 — ABNORMAL HIGH (ref 0.8–1.2)
Prothrombin Time: 16.4 seconds — ABNORMAL HIGH (ref 11.4–15.2)

## 2020-05-06 LAB — RESP PANEL BY RT-PCR (FLU A&B, COVID) ARPGX2
Influenza A by PCR: NEGATIVE
Influenza B by PCR: NEGATIVE
SARS Coronavirus 2 by RT PCR: NEGATIVE

## 2020-05-06 MED ORDER — APIXABAN 2.5 MG PO TABS
2.5000 mg | ORAL_TABLET | Freq: Two times a day (BID) | ORAL | Status: DC
  Administered 2020-05-06: 14:00:00 2.5 mg via ORAL
  Filled 2020-05-06 (×2): qty 1

## 2020-05-06 MED ORDER — CHLORHEXIDINE GLUCONATE CLOTH 2 % EX PADS
6.0000 | MEDICATED_PAD | Freq: Every day | CUTANEOUS | Status: DC
  Filled 2020-05-06 (×2): qty 6

## 2020-05-06 MED ORDER — HEPARIN SODIUM (PORCINE) 5000 UNIT/ML IJ SOLN: 5000.0000 [IU] | Freq: Three times a day (TID) | INTRAMUSCULAR | Status: DC

## 2020-05-06 MED ORDER — NITROGLYCERIN IN D5W 200-5 MCG/ML-% IV SOLN
INTRAVENOUS | Status: AC
  Administered 2020-05-06: 02:00:00 50 ug/min via INTRAVENOUS
  Filled 2020-05-06: qty 250

## 2020-05-06 MED ORDER — FUROSEMIDE 10 MG/ML IJ SOLN: 80.0000 mg | Freq: Two times a day (BID) | INTRAMUSCULAR | Status: DC

## 2020-05-06 MED ORDER — FUROSEMIDE 10 MG/ML IJ SOLN: 20.0000 mg | Freq: Two times a day (BID) | INTRAMUSCULAR | Status: DC

## 2020-05-06 MED ORDER — ONDANSETRON HCL 4 MG/2ML IJ SOLN: 4.0000 mg | Freq: Four times a day (QID) | INTRAMUSCULAR | Status: DC | PRN

## 2020-05-06 MED ORDER — IRBESARTAN 150 MG PO TABS
150.0000 mg | ORAL_TABLET | Freq: Every evening | ORAL | Status: DC
  Filled 2020-05-06: qty 1

## 2020-05-06 MED ORDER — ACETAMINOPHEN 325 MG PO TABS: 650.0000 mg | ORAL_TABLET | ORAL | Status: DC | PRN

## 2020-05-06 MED ORDER — NITROGLYCERIN IN D5W 200-5 MCG/ML-% IV SOLN: 0.0000 ug/min | INTRAVENOUS | Status: DC

## 2020-05-06 MED ORDER — FUROSEMIDE 10 MG/ML IJ SOLN: 40.0000 mg | Freq: Two times a day (BID) | INTRAMUSCULAR | Status: DC

## 2020-05-06 MED ORDER — HYDRALAZINE HCL 50 MG PO TABS
50.0000 mg | ORAL_TABLET | Freq: Four times a day (QID) | ORAL | Status: DC | PRN
  Administered 2020-05-06: 16:00:00 50 mg via ORAL
  Filled 2020-05-06: qty 1

## 2020-05-06 MED ORDER — CARVEDILOL 6.25 MG PO TABS
6.2500 mg | ORAL_TABLET | Freq: Two times a day (BID) | ORAL | Status: DC
Start: 2020-05-06 — End: 2020-05-06
  Administered 2020-05-06: 16:00:00 6.25 mg via ORAL
  Filled 2020-05-06: qty 1

## 2020-05-06 MED ORDER — SODIUM CHLORIDE 0.9 % IV SOLN: 250.0000 mL | INTRAVENOUS | Status: DC | PRN

## 2020-05-06 MED ORDER — SODIUM CHLORIDE 0.9% FLUSH: 3.0000 mL | Freq: Two times a day (BID) | INTRAVENOUS | Status: DC

## 2020-05-06 MED ORDER — SODIUM CHLORIDE 0.9% FLUSH: 3.0000 mL | INTRAVENOUS | Status: DC | PRN

## 2020-05-06 NOTE — ED Notes (Signed)
Dr. Damita Dunnings in room to speak with patient and daughter.

## 2020-05-06 NOTE — Progress Notes (Signed)
[facility]  ROUNDING NOTE   Subjective:   [name] was admitted to [facility] on 05/06/2020 for Acute CHF (congestive heart failure) ([place]) [I50.9]  Last hemodialysis treatment was 12./13. Where he left at 54.9kg. However daughter who is at bedside states patient has not been eating well and has developed peripheral edema.    Objective:  Vital signs in last 24 hours:  Pulse Rate:  [64-126] 71 (12/15 0645) Resp:  [11-30] 13 (12/15 0645) BP: (133-259)/(70-159) 160/70 (12/15 0645) SpO2:  [100 %] 100 % (12/15 0645) Weight:  [54.9 kg] 54.9 kg (12/15 0239)  Weight change:  Filed Weights   05/06/20 0239  Weight: 54.9 kg    Intake/Output: No intake/output data recorded.   Intake/Output this shift:  No intake/output data recorded.  Physical Exam: General: Critically ill  Head: +BIPAP  Eyes: Anicteric, PERRL  Neck: Supple, trachea midline  Lungs:  Bilateral crackles  Heart: Regular rate and rhythm  Abdomen:  Soft, nontender,   Extremities:  + peripheral edema.  Neurologic: Answering by headnods.   Skin: No lesions  Access: RIJ permcath    Basic Metabolic Panel: Recent Labs  Lab 05/06/20 0214  NA 145  K 5.0  CL 106  CO2 24  GLUCOSE 170*  BUN 41*  CREATININE 6.77*  CALCIUM 8.9  MG 2.5*    Liver Function Tests: Recent Labs  Lab 05/06/20 0214  AST 26  ALT 13  ALKPHOS 97  BILITOT 0.8  PROT 7.9  ALBUMIN 4.0   No results for input(s): LIPASE, AMYLASE in the last 168 hours. No results for input(s): AMMONIA in the last 168 hours.  CBC: Recent Labs  Lab 05/06/20 0214  WBC 12.8*  NEUTROABS 5.7  HGB 12.0*  HCT 38.2*  MCV 109.5*  PLT 154    Cardiac Enzymes: No results for input(s): CKTOTAL, CKMB, CKMBINDEX, TROPONINI in the last 168 hours.  BNP: Invalid input(s): POCBNP  CBG: No results for input(s): GLUCAP in the last 168 hours.  Microbiology: Results for orders placed or performed during the hospital encounter of 05/06/20   Resp Panel by RT-PCR (Flu A&B, Covid) Nasopharyngeal Swab     Status: None   Collection Time: 05/06/20  2:14 AM   Specimen: Nasopharyngeal Swab; Nasopharyngeal(NP) swabs in vial transport medium  Result Value Ref Range Status   SARS Coronavirus 2 by RT PCR NEGATIVE NEGATIVE Final    Comment: (NOTE) SARS-CoV-2 target nucleic acids are NOT DETECTED.  The SARS-CoV-2 RNA is generally detectable in upper respiratory specimens during the acute phase of infection. The lowest concentration of SARS-CoV-2 viral copies this assay can detect is 138 copies/mL. A negative result does not preclude SARS-Cov-2 infection and should not be used as the sole basis for treatment or other patient management decisions. A negative result may occur with  improper specimen collection/handling, submission of specimen other than nasopharyngeal swab, presence of viral mutation(s) within the areas targeted by this assay, and inadequate number of viral copies(<138 copies/mL). A negative result must be combined with clinical observations, patient history, and epidemiological information. The expected result is Negative.  Fact Sheet for Patients:  EntrepreneurPulse.com.au  Fact Sheet for Healthcare Providers:  IncredibleEmployment.be  This test is no t yet approved or cleared by the [place] FDA and  has been authorized for detection and/or diagnosis of SARS-CoV-2 by FDA under an Emergency Use Authorization (EUA). This EUA will remain  in effect (meaning this test can be used) for the duration of the COVID-19 declaration  under Section 564(b)(1) of the Act, 21 U.S.C.section 360bbb-3(b)(1), unless the authorization is terminated  or revoked sooner.       Influenza A by PCR NEGATIVE NEGATIVE Final   Influenza B by PCR NEGATIVE NEGATIVE Final    Comment: (NOTE) The Xpert Xpress SARS-CoV-2/FLU/RSV plus assay is intended as an aid in the diagnosis of influenza from  Nasopharyngeal swab specimens and should not be used as a sole basis for treatment. Nasal washings and aspirates are unacceptable for Xpert Xpress SARS-CoV-2/FLU/RSV testing.  Fact Sheet for Patients: EntrepreneurPulse.com.au  Fact Sheet for Healthcare Providers: IncredibleEmployment.be  This test is not yet approved or cleared by the [place] FDA and has been authorized for detection and/or diagnosis of SARS-CoV-2 by FDA under an Emergency Use Authorization (EUA). This EUA will remain in effect (meaning this test can be used) for the duration of the COVID-19 declaration under Section 564(b)(1) of the Act, 21 U.S.C. section 360bbb-3(b)(1), unless the authorization is terminated or revoked.  Performed at [facility], [address] [place], [place] [address]     Coagulation Studies: Recent Labs    05/06/20 0214  LABPROT 16.4*  INR 1.4*    Urinalysis: No results for input(s): COLORURINE, LABSPEC, PHURINE, GLUCOSEU, HGBUR, BILIRUBINUR, KETONESUR, PROTEINUR, UROBILINOGEN, NITRITE, LEUKOCYTESUR in the last 72 hours.  Invalid input(s): APPERANCEUR    Imaging: DG Chest Port 1 View  Result Date: 05/06/2020 CLINICAL DATA:  Shortness of breath EXAM: PORTABLE CHEST 1 VIEW COMPARISON:  04/11/2020 FINDINGS: Unchanged position of right IJ approach hemodialysis catheter. Small pleural effusions with bibasilar opacities. Mild pulmonary edema. IMPRESSION: Mild pulmonary edema with small pleural effusions. Electronically Signed   By: Ulyses Jarred M.D.   On: 05/06/2020 02:49     Medications:   . sodium chloride    . nitroGLYCERIN Stopped (05/06/20 0727)   . apixaban  2.5 mg Oral BID  . carvedilol  6.25 mg Oral BID WC  . Chlorhexidine Gluconate Cloth  6 each Topical Q0600  . furosemide  40 mg Intravenous Q12H  . irbesartan  150 mg Oral QPM  . sodium chloride flush  3 mL Intravenous Q12H   sodium chloride, acetaminophen,  ondansetron (ZOFRAN) IV, sodium chloride flush  Assessment/ Plan:  Mr. Menashe Kafer is a 74 y.o. [place] male with end stage renal disease on hemodialysis, hypertension, peripheral vascular disease, hyperlipidemia who is admitted to [facility] on 05/06/2020 for Acute CHF (congestive heart failure) ([place]) [I50.9]  [facility] M[facility] RIJ permcath 55kg  1. End Stage Renal Disease: needing emergent hemodialysis with pulmonary edema and acute respiratory failure requiring noninvasive ventilation.  - Hemodialysis orders prepared. Net goal UF of 3 liters.  - Suspect patient has lost weight and needs his target weight adjusted.   2. Hypertension: urgency on admission requiring nitro gtt. Recommend restarting home regimen after dialysis treatment. Home regimen of irbesartan, carvedilol,  hydralazine and torsemide.  3. Anemia of chronic kidney disease: hemoglobin of 12. EPO as outpatient.   4. Secondary Hyperparathyroidism: outpatient labs from 12/13: PTH elevated at 812. Calcium and phosphorus at goal. Not currently on binders.   5. Acute respiratory failure secondary to acute exacerbation of chronic systolic and diastolic congestive heart failure.  - IV furosemide given.  - Placed on BIPAP   LOS: 0 Rockney Grenz 12/15/20218:05 AM

## 2020-05-06 NOTE — ED Notes (Signed)
Pt finished dialysis and appears to be in NAD at this time. Pt requesting to have a break from BIPAP. Pt removed from BIPAP to have a trial run. Unable to get a hold of RT at this time to inform them. VSS. Awaiting further orders. Will continue to monitor.

## 2020-05-06 NOTE — ED Notes (Signed)
RT notified that pt has been removed from BIPAP for a trial run. Pt tolerating without complication at this time. VSS. Awaiting further orders. Will continue to monitor.

## 2020-05-06 NOTE — ED Notes (Signed)
Per daughter, pt does make urine still about 2x per day

## 2020-05-06 NOTE — ED Notes (Signed)
Dr. Karma Greaser  notified about elevated troponin

## 2020-05-06 NOTE — ED Notes (Signed)
Pt resting quietly at this time, bipap in place, daughter at bedside. Pending admit.

## 2020-05-06 NOTE — Progress Notes (Signed)
Patient continues to voice no concerns at this time. Will continue to monitor.

## 2020-05-06 NOTE — ED Notes (Signed)
Pts daughter stating that she would like to speak with MD about taking pt home. Jimmye Norman, MD notified via secure chat. Pt in NAD at this time. VSS. Will continue to monitor.

## 2020-05-06 NOTE — ED Notes (Signed)
Took over care of pt. Pt currently receiving dialysis and on BIPAP. Holding PO meds at this time. Pt in NAD at this time. VSS. Awaiting further orders. Will continue to monitor.

## 2020-05-06 NOTE — Care Management CC44 (Signed)
Condition Code 44 Documentation Completed  Patient Details  Name: Scott Oconnor MRN: [id number] Date of Birth: 10/17/1945   Condition Code 44 given:  Yes Patient signature on Condition Code 44 notice:  Yes Documentation of 2 MD's agreement:  Yes Code 44 added to claim:  Yes    Iona Beard, [place] 05/06/2020, 5:47 PM

## 2020-05-06 NOTE — ED Notes (Signed)
Pt resting quietly at this time, monitoring BP and adjusting NTG drip.  Daughter at bedside, pt asking for ice chips, was advised by Dr. Karma Greaser to hold off for now. Daughter updated on plan of care and that Dr. Damita Dunnings will be admitting patient and making rounds to ED as soon as she can.

## 2020-05-06 NOTE — Progress Notes (Signed)
[place] for Apixaban Indication: DVT Prophylaxis  Allergies  Allergen Reactions  . Ivp Dye [Iodinated Diagnostic Agents]     ESRD patient     Patient Measurements: Height: 5' 10.5" (179.1 cm) Weight: 54.9 kg (121 lb 0.5 oz) IBW/kg (Calculated) : 74.15  Vital Signs: BP: 151/88 (12/15 0412) Pulse Rate: 77 (12/15 0412)  Labs: Recent Labs    05/06/20 0214  HGB 12.0*  HCT 38.2*  PLT 154  LABPROT 16.4*  INR 1.4*  CREATININE 6.77*  TROPONINIHS 54*    Estimated Creatinine Clearance: 7.5 mL/min (A) (by C-G formula based on SCr of 6.77 mg/dL (H)).   Medical History: Past Medical History:  Diagnosis Date  . Hyperlipidemia   . Hypertension   . PVD (peripheral vascular disease) ([place])   . Renal disorder    ESRD  . 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  INR 1.4, Hgb 12, Plt 154, SCr 6.77   Plan:  Continue with Apixiban 2.5 mg BID Monitor CBC weekly.  Renda Rolls, PharmD, [facility] 05/06/2020 4:31 AM

## 2020-05-06 NOTE — ED Notes (Signed)
Pt resting quietly at this time, respirations equal and unlabored, daughter remains at bedside. Waiting for bed in Stepdown unit.

## 2020-05-06 NOTE — ED Provider Notes (Addendum)
[facility] Emergency Department Provider Note  ____________________________________________   Event Date/Time   First MD Initiated Contact with Patient 05/06/20 [phone]     (approximate)  I have reviewed the triage vital signs and the nursing notes.   HISTORY  Chief Complaint Shortness of Breath  Level 5 caveat:  history/ROS limited by acute/critical illness  HPI Scott Oconnor is a 74 y.o. male with medical history as listed below which notably includes end-stage renal disease on dialysis Mondays, Wednesdays, and Fridays.  He presents tonight with acute onset and severe shortness of breath.  His daughter provides most of the history as the patient is not able to talk very well at this time.  She said that he was in his normal state of health but then awoke gasping for breath.  He had a similar prior presentation when he had flash pulmonary edema.  His daughter checked his blood pressure at home and it was very elevated at far greater than 622 systolic.  She gave him a dose of hydralazine 200 mg but it did not seem to help so they brought him to the ED by private vehicle.  He was brought immediately back to a room upon arrival and I was alerted to his presence.  The patient is able to shake his head no when asked if he has any pain.  He admits to severe shortness of breath.  No recent fevers or chills.  He has been fully vaccinated for COVID-19.   He is due for dialysis this morning.        Past Medical History:  Diagnosis Date  . Hyperlipidemia   . Hypertension   . PVD (peripheral vascular disease) ([place])   . Renal disorder    ESRD  . Reynolds syndrome [facility])     Patient Active Problem List   Diagnosis Date Noted  . Anemia of chronic kidney failure, stage 5 ([place]) 05/06/2020  . Acute on chronic systolic CHF (congestive heart failure) ([place]) 05/06/2020  . Hypertensive emergency 05/06/2020  . Elevated troponin 05/06/2020  . Acute CHF (congestive  heart failure) ([employer]) 05/06/2020  . Acute respiratory failure with hypoxia ([place]) 05/06/2020  . Chronic anticoagulation 05/06/2020  . End stage renal disease on dialysis ([place]) 04/11/2020  . Altered mental status 04/10/2020  . HFrEF (heart failure with reduced ejection fraction) ([place])   . Acute pulmonary edema ([place]) 11/18/2019  . Protein-calorie malnutrition, severe 09/27/2019  . Acute kidney failure, unspecified ([place])   . History of AAA (abdominal aortic aneurysm) repair   . Anemia   . Thrombocytopenia ([place])   . Acute kidney injury superimposed on CKD ([place])   . Essential hypertension   . Hyperlipidemia   . Fatigue 09/19/2019  . AAA (abdominal aortic aneurysm) ([place]) 08/14/2019  . AAA (abdominal aortic aneurysm) without rupture ([place]) 08/01/2019  . PVD (peripheral vascular disease) ([place]) 07/24/2019  . Tobacco abuse 07/24/2019  . Raynaud's disease without gangrene 06/21/2019  . Mixed dyslipidemia 06/15/2018  . Family history of heart attack 06/15/2018  . Smokers' cough ([place]) 06/16/2017    Past Surgical History:  Procedure Laterality Date  . DIALYSIS/PERMA CATHETER INSERTION N/A 09/26/2019   Procedure: DIALYSIS/PERMA CATHETER INSERTION;  Surgeon: Algernon Huxley, MD;  Location: [place] [facility];  Service: Cardiovascular;  Laterality: N/A;  . ENDOVASCULAR REPAIR/STENT GRAFT N/A 08/14/2019   Procedure: ENDOVASCULAR REPAIR/STENT GRAFT;  Surgeon: Katha Cabal, MD;  Location: [place] [facility];  Service: Cardiovascular;  Laterality: N/A;    Prior  to Admission medications   Medication Sig Start Date End Date Taking? Authorizing Provider  acetaminophen (TYLENOL) 500 MG tablet Take 500-1,000 mg by mouth every 8 (eight) hours as needed for mild pain or moderate pain.     [provider]  albuterol (VENTOLIN HFA) 108 (90 Base) MCG/ACT inhaler Inhale 2 puffs into the lungs every 6 (six) hours as needed for wheezing or shortness of breath. 11/23/19   Pokhrel, Corrie Mckusick, MD  apixaban  (ELIQUIS) 2.5 MG TABS tablet Take 1 tablet (2.5 mg total) by mouth 2 (two) times daily. 12/03/19   Loel Dubonnet, NP  atorvastatin (LIPITOR) 40 MG tablet Take 1 tablet (40 mg total) by mouth daily. Patient taking differently: Take 40 mg by mouth every evening.  11/24/19   Pokhrel, Corrie Mckusick, MD  calcium carbonate (TUMS - DOSED IN MG ELEMENTAL CALCIUM) 500 MG chewable tablet Chew 1 tablet by mouth 3 (three) times daily after meals.    [provider]  carvedilol (COREG) 6.25 MG tablet Take 1 tablet (6.25 mg total) by mouth 2 (two) times daily with a meal. 11/23/19   Pokhrel, [place], MD  docusate sodium (COLACE) 100 MG capsule Take 1 capsule (100 mg total) by mouth 2 (two) times daily as needed for mild constipation. 11/23/19   Pokhrel, Corrie Mckusick, MD  gabapentin (NEURONTIN) 300 MG capsule Take 300 mg by mouth at bedtime.  11/09/19   [provider]  guaiFENesin (MUCINEX) 600 MG 12 hr tablet Take 600 mg by mouth 2 (two) times daily as needed for cough or to loosen phlegm.    [provider]  hydrALAZINE (APRESOLINE) 25 MG tablet Take 25 mg by mouth 3 (three) times daily.     [provider]  irbesartan (AVAPRO) 150 MG tablet Take 150 mg by mouth every evening.  10/22/19   [provider]  multivitamin (RENA-VIT) TABS tablet Take 1 tablet by mouth at bedtime. 09/30/19   Fritzi Mandes, MD  torsemide (DEMADEX) 20 MG tablet Take 2 tablets (40 mg total) by mouth daily. 11/23/19   Pokhrel, Corrie Mckusick, MD    Allergies Ivp dye [iodinated diagnostic agents]  Family History  Problem Relation Age of Onset  . Heart disease Father   . Sudden Cardiac Death Father   . Heart disease Brother   . Hyperlipidemia Brother   . Heart disease Sister   . Heart disease Brother   . Hyperlipidemia Brother   . Hyperlipidemia Brother   . Hyperlipidemia Brother   . Hyperlipidemia Brother   . Hyperlipidemia Brother   . Heart disease Sister     Social History Social History   Tobacco Use  .  Smoking status: Former Smoker    Packs/day: 0.33    Types: Cigarettes  . Smokeless tobacco: Never Used  . Tobacco comment: 6 cigarettes daily   Vaping Use  . Vaping Use: Never used  Substance Use Topics  . Alcohol use: No  . Drug use: No    Review of Systems Level 5 caveat:  history/ROS limited by acute/critical illness   ____________________________________________   PHYSICAL EXAM:  VITAL SIGNS: ED Triage Vitals  Enc Vitals Group     BP 05/06/20 0213 (!) 259/159     Pulse Rate 05/06/20 0230 (!) 126     Resp 05/06/20 0209 (!) 26     Temp --      Temp src --      SpO2 05/06/20 0209 100 %     Weight 05/06/20 0239 54.9 kg (121  lb 0.5 oz)     Height 05/06/20 0239 1.791 m (5' 10.5")     Head Circumference --      Peak Flow --      Pain Score --      Pain Loc --      Pain Edu? --      Excl. in [place]? --     Constitutional: Awake and alert but in severe respiratory distress. Eyes: Conjunctivae are normal.  Head: Atraumatic. Nose: No congestion/rhinnorhea. Mouth/Throat: Patient is wearing a mask. Neck: No stridor.  No meningeal signs.   Cardiovascular: Tachycardia, regular rhythm. Good peripheral circulation. Respiratory: Severe respiratory distress, accessory muscle usage and intercostal retractions, decreased air movement /diminished breath sounds throughout. Gastrointestinal: Soft and nontender. No distention.  Musculoskeletal: No clinically significant lower extremity edema. No gross deformities of extremities. Neurologic:  No gross focal neurologic deficits are appreciated, but patient not able to fully participate in neurological exam.  Moving all four extremities. Skin:  Skin is warm, dry and intact.  ____________________________________________   LABS (all labs ordered are listed, but only abnormal results are displayed)  Labs Reviewed  COMPREHENSIVE METABOLIC PANEL - Abnormal; Notable for the following components:      Result Value   Glucose, Bld 170 (*)     BUN 41 (*)    Creatinine, Ser 6.77 (*)    GFR, Estimated 8 (*)    All other components within normal limits  BRAIN NATRIURETIC PEPTIDE - Abnormal; Notable for the following components:   B Natriuretic Peptide >4,500.0 (*)    All other components within normal limits  CBC WITH DIFFERENTIAL/PLATELET - Abnormal; Notable for the following components:   WBC 12.8 (*)    RBC 3.49 (*)    Hemoglobin 12.0 (*)    HCT 38.2 (*)    MCV 109.5 (*)    MCH 34.4 (*)    RDW 16.0 (*)    Lymphs Abs 4.6 (*)    Eosinophils Absolute 1.4 (*)    All other components within normal limits  PROTIME-INR - Abnormal; Notable for the following components:   Prothrombin Time 16.4 (*)    INR 1.4 (*)    All other components within normal limits  MAGNESIUM - Abnormal; Notable for the following components:   Magnesium 2.5 (*)    All other components within normal limits  TROPONIN I (HIGH SENSITIVITY) - Abnormal; Notable for the following components:   Troponin I (High Sensitivity) 54 (*)    All other components within normal limits  RESP PANEL BY RT-PCR (FLU A&B, COVID) ARPGX2  TROPONIN I (HIGH SENSITIVITY)   ____________________________________________  EKG  ED ECG REPORT I, Hinda Kehr, the attending physician, personally viewed and interpreted this ECG.  Date: 05/06/2020 EKG Time: 2:09 AM Rate: 116 Rhythm: Sinus tachycardia QRS Axis: normal Intervals: LVH ST/T Wave abnormalities: Non-specific ST segment / T-wave changes, but no clear evidence of acute ischemia. Narrative Interpretation: no definitive evidence of acute ischemia; does not meet STEMI criteria.   ____________________________________________  RADIOLOGY I, Hinda Kehr, personally viewed and evaluated these images (plain radiographs) as part of my medical decision making, as well as reviewing the written report by the radiologist.  ED MD interpretation: Pulmonary edema  Official radiology report(s): DG Chest Port 1 View  Result  Date: 05/06/2020 CLINICAL DATA:  Shortness of breath EXAM: PORTABLE CHEST 1 VIEW COMPARISON:  04/11/2020 FINDINGS: Unchanged position of right IJ approach hemodialysis catheter. Small pleural effusions with bibasilar opacities. Mild pulmonary edema. IMPRESSION: Mild  pulmonary edema with small pleural effusions. Electronically Signed   By: Ulyses Jarred M.D.   On: 05/06/2020 02:49    ____________________________________________   PROCEDURES   Procedure(s) performed (including Critical Care):  .Critical Care Performed by: Hinda Kehr, MD Authorized by: Hinda Kehr, MD   Critical care provider statement:    Critical care time (minutes):  45   Critical care time was exclusive of:  Separately billable procedures and treating other patients   Critical care was necessary to treat or prevent imminent or life-threatening deterioration of the following conditions:  Respiratory failure and circulatory failure   Critical care was time spent personally by me on the following activities:  Development of treatment plan with patient or surrogate, discussions with consultants, evaluation of patient's response to treatment, examination of patient, obtaining history from patient or surrogate, ordering and performing treatments and interventions, ordering and review of laboratory studies, ordering and review of radiographic studies, pulse oximetry, re-evaluation of patient's condition and review of old charts .1-3 Lead EKG Interpretation Performed by: Hinda Kehr, MD Authorized by: Hinda Kehr, MD     Interpretation: abnormal     ECG rate:  120   ECG rate assessment: tachycardic     Rhythm: sinus tachycardia     Ectopy: none     Conduction: normal       ____________________________________________   INITIAL IMPRESSION / MDM / ASSESSMENT AND PLAN / ED COURSE  As part of my medical decision making, I reviewed the following data within the electronic MEDICAL RECORD NUMBER History obtained from  family, Nursing notes reviewed and incorporated, Labs reviewed , EKG interpreted , Old chart reviewed, Radiograph reviewed , Discussed with admitting physician (Dr. Damita Dunnings) and Notes from prior ED visits   Differential diagnosis includes, but is not limited to, flash pulmonary edema, [place] hypertension or hypertensive emergency, acute infection, pneumothorax, PE, electrolyte or metabolic abnormality.  The patient is on the cardiac monitor to evaluate for evidence of arrhythmia and/or significant heart rate changes.  We started the patient on BiPAP immediately.  I verify that his initial blood pressure was massively elevated with a systolic of about 619 and a diastolic of approximately 150.  At that point I ordered a nitroglycerin infusion starting at 50 mcg/min to be titrated every 3 min to a target systolic blood pressure between 170 and 190 (his daughter confirmed that his usual systolic blood pressure is about 180).   Patient was initially a bit anxious on the BiPAP but began to settle down.  CBC generally reassuring.  Comprehensive metabolic panel unremarkable for a patient with end-stage renal disease on dialysis.  Troponin slightly elevated as anticipated.  BNP pending.     Clinical Course as of 05/06/20 5093  Wed May 06, 2020  0258 B Natriuretic Peptide(!): >4,500.0 [CF]  0258 DG Chest [facility] I personally reviewed the patient's imaging and agree with the radiologist's interpretation that there is pulmonary edema and some small effusions. [CF]  [id number] Patient has stabilized on nitroglycerin drip at 130 mcg/min with a systolic blood pressure of about 180.  He is on BiPAP and feeling much better at this time and is resting comfortably.  I updated the daughter about the plan for stepdown admission.  I talked with Marda Stalker in the ICU and she felt it was an appropriate stepdown admission for the hospitalist service.  I consult the hospitalist for admission.  I called nephrology (Dr.  Juleen China) and left message on both numbers for him to  call me back.  I also sent a secure chat message with the patient attached to that he would know about the patient to schedule dialysis this morning.  [CF]  0340 My secretary has called again to both of Dr. Assunta Gambles numbers and left messages. [CF]  0340 SARS Coronavirus 2 by RT PCR: NEGATIVE [CF]  6283 Discussed case by phone with hospitalist (Dr. Damita Dunnings) who will admit.  Awaiting nephrology callback. [CF]  [phone] Given the difficult [CF]  0604 Dr. Jake Bathe returned my phone calls and we discussed the case.  He will put the patient on the schedule this morning for dialysis. [CF]    Clinical Course User Index [CF] Hinda Kehr, MD     ____________________________________________  FINAL CLINICAL IMPRESSION(S) / ED DIAGNOSES  Final diagnoses:  Acute respiratory failure, unspecified whether with hypoxia or hypercapnia (HCC)  Acute pulmonary edema (HCC)  Malignant hypertension     MEDICATIONS GIVEN DURING THIS VISIT:  Medications  nitroGLYCERIN 50 mg in dextrose 5 % 250 mL (0.2 mg/mL) infusion (80 mcg/min Intravenous Rate/Dose Change 05/06/20 0401)  carvedilol (COREG) tablet 6.25 mg (has no administration in time range)  irbesartan (AVAPRO) tablet 150 mg (has no administration in time range)  sodium chloride flush (NS) 0.9 % injection 3 mL (has no administration in time range)  sodium chloride flush (NS) 0.9 % injection 3 mL (has no administration in time range)  0.9 %  sodium chloride infusion (has no administration in time range)  acetaminophen (TYLENOL) tablet 650 mg (has no administration in time range)  ondansetron (ZOFRAN) injection 4 mg (has no administration in time range)  furosemide (LASIX) injection 80 mg (has no administration in time range)  apixaban (ELIQUIS) tablet 2.5 mg (has no administration in time range)     ED Discharge Orders    None      *Please note:  Scott Oconnor was evaluated in Emergency  Department on 05/06/2020 for the symptoms described in the history of present illness. He was evaluated in the context of the global COVID-19 pandemic, which necessitated consideration that the patient might be at risk for infection with the SARS-CoV-2 virus that causes COVID-19. Institutional protocols and algorithms that pertain to the evaluation of patients at risk for COVID-19 are in a state of rapid change based on information released by regulatory bodies including the CDC and federal and state organizations. These policies and algorithms were followed during the patient's care in the ED.  Some ED evaluations and interventions may be delayed as a result of limited staffing during and after the pandemic.*  Note:  This document was prepared using Dragon voice recognition software and may include unintentional dictation errors.   Hinda Kehr, MD 05/06/20 [place], [place], MD 05/06/20 [phone]

## 2020-05-06 NOTE — ED Triage Notes (Signed)
Shortness of breath that started this evening

## 2020-05-06 NOTE — ED Triage Notes (Signed)
[name] reports that she was called upstairs by her mother who said pt was not feeling well; b/p noted to be elevated and daughter gave hydralazine 100mg  ~ 2am; pt also noted to be short of breath so brought to the ED. Pt last had dialysis on Monday

## 2020-05-06 NOTE — Care Management Obs Status (Signed)
[place] NOTIFICATION   Patient Details  Name: Arlind Klingerman MRN: [id number] Date of Birth: Feb 10, 1946   Medicare Observation Status Notification Given:  Yes    Iona Beard, Latanya Presser 05/06/2020, 5:47 PM

## 2020-05-06 NOTE — Progress Notes (Signed)
Message sent to Dr. Juleen China at this time concerning decreased blood pressure and increase with HR. UF off at this time. BP to be rechecked.

## 2020-05-06 NOTE — Progress Notes (Signed)
Noting improvement with blood pressures at this time. Patient tolerating treatment well. Will continue to monitor.

## 2020-05-06 NOTE — Progress Notes (Addendum)
Patient requesting water at this time. RN educated the patient on fluid restrictions during HD with UF goal of 3L. Primary RN made aware of patients request and states she will ask the doctor and look at his orders. Will continue to monitor.

## 2020-05-06 NOTE — Progress Notes (Signed)
Spoke with patients daughter on the phone at this time with an update given on patient status with fluid removal.

## 2020-05-06 NOTE — ED Notes (Signed)
Dialysis RN at bedside with the patient.

## 2020-05-06 NOTE — Discharge Summary (Signed)
Physician Discharge Summary  Scott Oconnor Scott Oconnor:[id number] DOB: 15-Aug-1945 DOA: 05/06/2020  PCP: [name], MD  Admit date: 05/06/2020 Discharge date: 05/06/2020  Admitted From: home Disposition:  Home w/ family care  Recommendations for Outpatient Follow-up:  1. Follow up with PCP in 1 week 2. F/u nephro, Dr. Juleen China in 1 day  Home Health: no  Equipment/Devices:  Discharge Condition: stable  CODE STATUS: full Diet recommendation: Heart Healthy / renal diet   Brief/Interim Summary: HPI was taken from Dr. Damita Dunnings:  Scott Oconnor is a 74 y.o. 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  Blood pressure at home was found to be elevated and daughter administered hydralazine 100 mg.  No missed dialysis session.  History limited due to shortness of breath and is provided by daughter at bedside who is his main caregiver.  Daughter states that she noticed that her father's legs were swelling over the past couple days and that he was a little short winded on walking over the past 1 day.  He has not missed any dialysis sessions but she states that he had hernia repair a month ago and since then has been eating very little and lost a lot of weight and she believes his dry weight has decreased but it was not adjusted in dialysis so she feels as though adequate fluid was not being taken off during his dialysis sessions. ED Course: On arrival patient was in acute respiratory distress, BP 259/159, pulse 118, respirations 26.  Patient on BiPAP satting at 100%.  Blood work with WBC 12800, hemoglobin 12, CMP with renal function as expected for dialysis without hyperkalemia and with normal bicarb.  Troponin 54, magnesium 2.5, BNP over 4500. EKG not yet  done Imaging: Mild pulmonary edema with small pleural effusions Patient started on nitroglycerin infusion.  Hospitalist consulted for admission.  Hospital Course from Dr. Jimmye Norman 05/06/20: Pt presented w/ acute pulmonary edema and initially required BiPAP. Pt was able to be weaned off of BiPAP after HD. Pt is now saturating high 90s on RA. Also, pt was found to have HTN emergency which resolved prior to d/c. At baseline pt has uncontrolled HTN. Pt will f/u w/ nephro, Dr. Juleen China, in 1 day after d/c. For more information, please see previous progress/consult notes.   Discharge Diagnoses:  Principal Problem:   Acute pulmonary edema (HCC) Active Problems:   PVD (peripheral vascular disease) ([place])   Essential hypertension   History of AAA (abdominal aortic aneurysm) repair   End stage renal disease on dialysis (HCC)   Anemia of chronic kidney failure, stage 5 (HCC)   Acute on chronic systolic CHF (congestive heart failure) (HCC)   Hypertensive emergency   Elevated troponin   Acute CHF (congestive heart failure) ([place])   Acute respiratory failure with hypoxia (HCC)   Chronic anticoagulation  Acute pulmonary edema: resolved after HD. BiPAP was weaned off. Monitor I/Os. Currently high 90s on RA   Acute on chronic systolic CHF: continue on IV lasix. Monitor I/Os. Much improved after HD   Hypertensive emergency: resolved but still w/ HTN. Continue on carvedilol, irbesartan. Weaned off of nitro drip   Elevated troponin: likely secondary to demand ischemia.  Leukocytosis: likely reactive. Will continue to monitor    PVD: w/ hx of AAA repair.  Continue on statin   ESRD: on HD. Nephro following and recs apprec   ACD: likely secondary to ESRD. No need for a transfusion currently    History of IJ thrombus:  complication of dialysis catheter. Continue on eliquis    Discharge Instructions  Discharge Instructions    Diet - low sodium heart healthy   Complete by: As directed     Discharge instructions   Complete by: As directed    F/u w/ nephro, Dr. Juleen China on 05/06/20. F/u w/ PCP in 1 week   Increase activity slowly   Complete by: As directed    No wound care   Complete by: As directed      Allergies as of 05/06/2020      Reactions   Ivp Dye [iodinated Diagnostic Agents]    ESRD patient       Medication List    TAKE these medications   acetaminophen 500 MG tablet Commonly known as: TYLENOL Take 500-1,000 mg by mouth every 8 (eight) hours as needed for mild pain or moderate pain.   atorvastatin 40 MG tablet Commonly known as: LIPITOR Take 1 tablet (40 mg total) by mouth daily. What changed: when to take this   calcium carbonate 500 MG chewable tablet Commonly known as: TUMS - dosed in mg elemental calcium Chew 1 tablet by mouth 3 (three) times daily as needed for heartburn.   carvedilol 6.25 MG tablet Commonly known as: COREG Take 1 tablet (6.25 mg total) by mouth 2 (two) times daily with a meal.   Eliquis 5 MG Tabs tablet Generic drug: apixaban Take 5 mg by mouth 2 (two) times daily.   gabapentin 300 MG capsule Commonly known as: NEURONTIN Take 300 mg by mouth at bedtime.   hydrALAZINE 25 MG tablet Commonly known as: APRESOLINE Take 50 mg by mouth 3 (three) times daily.   irbesartan 150 MG tablet Commonly known as: AVAPRO Take 150 mg by mouth every evening.   multivitamin Tabs tablet Take 1 tablet by mouth at bedtime.   torsemide 20 MG tablet Commonly known as: DEMADEX Take 2 tablets (40 mg total) by mouth daily.       Allergies  Allergen Reactions  . Ivp Dye [Iodinated Diagnostic Agents]     ESRD patient     Consultations:  Nephro, Dr. Juleen China    Procedures/Studies: CT HEAD WO CONTRAST  Result Date: 04/10/2020 CLINICAL DATA:  74 year old male with syncope. EXAM: CT HEAD WITHOUT CONTRAST TECHNIQUE: Contiguous axial images were obtained from the base of the skull through the vertex without intravenous contrast.  COMPARISON:  Head CT dated 11/19/2019. FINDINGS: Brain: Mild age-related atrophy and chronic microvascular ischemic changes. There is no acute intracranial hemorrhage. No mass effect or midline shift no extra-axial fluid collection. Vascular: No hyperdense vessel or unexpected calcification. Skull: Normal. Negative for fracture or focal lesion. Sinuses/Orbits: There is opacification of several ethmoid air cells. No air-fluid level. The mastoid air cells are clear. Other: None IMPRESSION: 1. No acute intracranial pathology. 2. Mild age-related atrophy and chronic microvascular ischemic changes. Electronically Signed   By: Anner Crete M.D.   On: 04/10/2020 20:28   DG Chest Port 1 View  Result Date: 05/06/2020 CLINICAL DATA:  Shortness of breath EXAM: PORTABLE CHEST 1 VIEW COMPARISON:  04/11/2020 FINDINGS: Unchanged position of right IJ approach hemodialysis catheter. Small pleural effusions with bibasilar opacities. Mild pulmonary edema. IMPRESSION: Mild pulmonary edema with small pleural effusions. Electronically Signed   By: Cletus Gash.D.  On: 05/06/2020 02:49   DG Chest Port 1 View  Result Date: 04/11/2020 CLINICAL DATA:  Dyspnea.  Planned V/Q scan EXAM: PORTABLE CHEST 1 VIEW COMPARISON:  12/20/2019 FINDINGS: Partially obscured heart, normal size were covered. Dialysis catheter with tips at the SVC. Diffuse interstitial opacity with cephalized blood flow. Pleural effusion on the right more than left. There is a catheter over the abdomen which could be for peritoneal dialysis. Aortic stent graft which is minimally covered. IMPRESSION: Pulmonary edema and pleural effusions. Electronically Signed   By: Monte Fantasia M.D.   On: 04/11/2020 08:39      Subjective: pt c/o fatigue    Discharge Exam: Vitals:   05/06/20 1400 05/06/20 1530  BP: (!) 167/77 (!) 182/89  Pulse: 77 88  Resp: 14 17  SpO2: 99% 100%   Vitals:   05/06/20 1300 05/06/20 1330 05/06/20 1400 05/06/20 1530  BP: (!)  153/81 (!) 174/79 (!) 167/77 (!) 182/89  Pulse: 77 86 77 88  Resp: 18 13 14 17   SpO2: 100% 100% 99% 100%  Weight:      Height:        General: Pt is alert, awake, not in acute distress Cardiovascular: S1/S2 +, no rubs, no gallops Respiratory: diminished breath sounds b/l  Abdominal: Soft, NT, ND, bowel sounds + Extremities: no edema, no cyanosis    The results of significant diagnostics from this hospitalization (including imaging, microbiology, ancillary and laboratory) are listed below for reference.     Microbiology: Recent Results (from the past 240 hour(s))  Resp Panel by RT-PCR (Flu A&B, Covid) Nasopharyngeal Swab     Status: None   Collection Time: 05/06/20  2:14 AM   Specimen: Nasopharyngeal Swab; Nasopharyngeal(NP) swabs in vial transport medium  Result Value Ref Range Status   SARS Coronavirus 2 by RT PCR NEGATIVE NEGATIVE Final    Comment: (NOTE) SARS-CoV-2 target nucleic acids are NOT DETECTED.  The SARS-CoV-2 RNA is generally detectable in upper respiratory specimens during the acute phase of infection. The lowest concentration of SARS-CoV-2 viral copies this assay can detect is 138 copies/mL. A negative result does not preclude SARS-Cov-2 infection and should not be used as the sole basis for treatment or other patient management decisions. A negative result may occur with  improper specimen collection/handling, submission of specimen other than nasopharyngeal swab, presence of viral mutation(s) within the areas targeted by this assay, and inadequate number of viral copies(<138 copies/mL). A negative result must be combined with clinical observations, patient history, and epidemiological information. The expected result is Negative.  Fact Sheet for Patients:  EntrepreneurPulse.com.au  Fact Sheet for Healthcare Providers:  IncredibleEmployment.be  This test is no t yet approved or cleared by the [place] FDA and   has been authorized for detection and/or diagnosis of SARS-CoV-2 by FDA under an Emergency Use Authorization (EUA). This EUA will remain  in effect (meaning this test can be used) for the duration of the COVID-19 declaration under Section 564(b)(1) of the Act, 21 U.S.C.section 360bbb-3(b)(1), unless the authorization is terminated  or revoked sooner.       Influenza A by PCR NEGATIVE NEGATIVE Final   Influenza B by PCR NEGATIVE NEGATIVE Final    Comment: (NOTE) The Xpert Xpress SARS-CoV-2/FLU/RSV plus assay is intended as an aid in the diagnosis of influenza from Nasopharyngeal swab specimens and should not be used as a sole basis for treatment. Nasal washings and aspirates are unacceptable for Xpert Xpress SARS-CoV-2/FLU/RSV testing.  Fact Sheet for Patients: EntrepreneurPulse.com.au  Fact Sheet for Healthcare Providers: IncredibleEmployment.be  This test is not yet approved or cleared by the [place] FDA and has been authorized for detection and/or diagnosis of SARS-CoV-2 by FDA under an Emergency Use Authorization (EUA). This EUA will remain in effect (meaning this test can be used) for the duration of the COVID-19 declaration under Section 564(b)(1) of the Act, 21 U.S.C. section 360bbb-3(b)(1), unless the authorization is terminated or revoked.  Performed at [facility], [address] [place],  [address]      Labs: BNP (last 3 results) Recent Labs    09/29/19 0642 11/18/19 2118 05/06/20 0214  BNP 1,195.0* >4,500.0* >8,469.6*   Basic Metabolic Panel: Recent Labs  Lab 05/06/20 0214  NA 145  K 5.0  CL 106  CO2 24  GLUCOSE 170*  BUN 41*  CREATININE 6.77*  CALCIUM 8.9  MG 2.5*   Liver Function Tests: Recent Labs  Lab 05/06/20 0214  AST 26  ALT 13  ALKPHOS 97  BILITOT 0.8  PROT 7.9  ALBUMIN 4.0   No results for input(s): LIPASE, AMYLASE in the last 168 hours. No results for input(s):  AMMONIA in the last 168 hours. CBC: Recent Labs  Lab 05/06/20 0214  WBC 12.8*  NEUTROABS 5.7  HGB 12.0*  HCT 38.2*  MCV 109.5*  PLT 154   Cardiac Enzymes: No results for input(s): CKTOTAL, CKMB, CKMBINDEX, TROPONINI in the last 168 hours. BNP: Invalid input(s): POCBNP CBG: No results for input(s): GLUCAP in the last 168 hours. D-Dimer No results for input(s): DDIMER in the last 72 hours. Hgb A1c No results for input(s): HGBA1C in the last 72 hours. Lipid Profile No results for input(s): CHOL, HDL, LDLCALC, TRIG, CHOLHDL, LDLDIRECT in the last 72 hours. Thyroid function studies No results for input(s): TSH, T4TOTAL, T3FREE, THYROIDAB in the last 72 hours.  Invalid input(s): FREET3 Anemia work up No results for input(s): VITAMINB12, FOLATE, FERRITIN, TIBC, IRON, RETICCTPCT in the last 72 hours. Urinalysis    Component Value Date/Time   COLORURINE YELLOW (A) 09/20/2019 1418   APPEARANCEUR CLEAR (A) 09/20/2019 1418   LABSPEC 1.009 09/20/2019 1418   PHURINE 6.0 09/20/2019 1418   GLUCOSEU NEGATIVE 09/20/2019 1418   HGBUR NEGATIVE 09/20/2019 1418   BILIRUBINUR NEGATIVE 09/20/2019 1418   BILIRUBINUR negative 09/19/2019 1029   KETONESUR NEGATIVE 09/20/2019 1418   PROTEINUR >=300 (A) 09/20/2019 1418   UROBILINOGEN 0.2 09/19/2019 1029   NITRITE NEGATIVE 09/20/2019 1418   LEUKOCYTESUR NEGATIVE 09/20/2019 1418   Sepsis Labs Invalid input(s): PROCALCITONIN,  WBC,  LACTICIDVEN Microbiology Recent Results (from the past 240 hour(s))  Resp Panel by RT-PCR (Flu A&B, Covid) Nasopharyngeal Swab     Status: None   Collection Time: 05/06/20  2:14 AM   Specimen: Nasopharyngeal Swab; Nasopharyngeal(NP) swabs in vial transport medium  Result Value Ref Range Status   SARS Coronavirus 2 by RT PCR NEGATIVE NEGATIVE Final    Comment: (NOTE) SARS-CoV-2 target nucleic acids are NOT DETECTED.  The SARS-CoV-2 RNA is generally detectable in upper respiratory specimens during the acute phase  of infection. The lowest concentration of SARS-CoV-2 viral copies this assay can detect is 138 copies/mL. A negative result does not preclude SARS-Cov-2 infection and should not be used as the sole basis for treatment or other patient management decisions. A negative result may occur with  improper specimen collection/handling, submission of specimen other than nasopharyngeal swab, presence of viral mutation(s) within the areas targeted by this assay, and inadequate number of viral copies(<138  copies/mL). A negative result must be combined with clinical observations, patient history, and epidemiological information. The expected result is Negative.  Fact Sheet for Patients:  EntrepreneurPulse.com.au  Fact Sheet for Healthcare Providers:  IncredibleEmployment.be  This test is no t yet approved or cleared by the [place] FDA and  has been authorized for detection and/or diagnosis of SARS-CoV-2 by FDA under an Emergency Use Authorization (EUA). This EUA will remain  in effect (meaning this test can be used) for the duration of the COVID-19 declaration under Section 564(b)(1) of the Act, 21 U.S.C.section 360bbb-3(b)(1), unless the authorization is terminated  or revoked sooner.       Influenza A by PCR NEGATIVE NEGATIVE Final   Influenza B by PCR NEGATIVE NEGATIVE Final    Comment: (NOTE) The Xpert Xpress SARS-CoV-2/FLU/RSV plus assay is intended as an aid in the diagnosis of influenza from Nasopharyngeal swab specimens and should not be used as a sole basis for treatment. Nasal washings and aspirates are unacceptable for Xpert Xpress SARS-CoV-2/FLU/RSV testing.  Fact Sheet for Patients: EntrepreneurPulse.com.au  Fact Sheet for Healthcare Providers: IncredibleEmployment.be  This test is not yet approved or cleared by the [place] FDA and has been authorized for detection and/or diagnosis of  SARS-CoV-2 by FDA under an Emergency Use Authorization (EUA). This EUA will remain in effect (meaning this test can be used) for the duration of the COVID-19 declaration under Section 564(b)(1) of the Act, 21 U.S.C. section 360bbb-3(b)(1), unless the authorization is terminated or revoked.  Performed at [facility], [address] [place], [place] [address]      Time coordinating discharge: Over 30 minutes  SIGNED:   Wyvonnia Dusky, MD  [facility] 05/06/2020, 4:10 PM Pager   If 7PM-7AM, please contact night-coverage

## 2020-05-06 NOTE — Progress Notes (Signed)
Spoke with Dr. Juleen China at this time to inform of patients complaints with cramping to bilateral LE. UF remains off and noted improvement with blood pressure and HR. Dr. Juleen China ordered to discontinue treatment at this time versus given another NS bolus. Total UF at this time 2128 ml with 30 minutes remaining of treatment. Patient states agreement.

## 2020-05-06 NOTE — ED Notes (Signed)
Per secure message by Jimmye Norman, MD pt approved to be discharged home at this time.

## 2020-05-06 NOTE — Progress Notes (Addendum)
PROGRESS NOTE    Scott Oconnor  EPP:[id number] DOB: 04-25-1946 DOA: 05/06/2020 PCP: [name], MD  Assessment & Plan:   Principal Problem:   Acute pulmonary edema ([place]) Active Problems:   PVD (peripheral vascular disease) ([place])   Essential hypertension   History of AAA (abdominal aortic aneurysm) repair   End stage renal disease on dialysis ([place])   Anemia of chronic kidney failure, stage 5 (HCC)   Acute on chronic systolic CHF (congestive heart failure) (HCC)   Hypertensive emergency   Elevated troponin   Acute CHF (congestive heart failure) ([place])   Acute respiratory failure with hypoxia (HCC)   Chronic anticoagulation    Acute pulmonary edema: will need HD to remove fluid. Continue on BiPAP and wean as tolerated. Continue on IV lasix. Monitor I/Os.   Acute on chronic systolic CHF: continue on IV lasix. Monitor I/Os. Will need HD to remove fluid   Hypertensive emergency: resolved but still w/ HTN. Continue on carvedilol, irbesartan. Weaned off of nitro drip   Elevated troponin: likely secondary to demand ischemia.  Leukocytosis: likely reactive. Will continue to monitor     PVD: w/ hx of AAA repair. Continue on statin   ESRD: on HD. Nephro following and recs apprec    History of IJ thrombus:  complication of dialysis catheter. Continue on eliquis    DVT prophylaxis: eliquis  Code Status: full  Family Communication: called pt's daughter, Scott Oconnor, but no answer so left a message  Disposition Plan: depends on PT/OT recs.  Status is: Inpatient  Remains inpatient appropriate because:Ongoing diagnostic testing needed not appropriate for outpatient work up, IV treatments appropriate due to intensity of illness or inability to take PO and Inpatient level of care appropriate due to severity of illness   Dispo: The patient is from: Home              Anticipated d/c is to: Home              Anticipated d/c date is: 2 days              Patient currently is not  medically stable to d/c.     Consultants:   nephro    Procedures:    Antimicrobials:    Subjective: Pt c/o shortness of breath  Objective: Vitals:   05/06/20 0636 05/06/20 0639 05/06/20 0642 05/06/20 0645  BP: (!) 160/72 (!) 156/78 (!) 158/77 (!) 160/70  Pulse: 66 73 72 71  Resp: 12 17 13 13   SpO2: 100% 100% 100% 100%  Weight:      Height:       No intake or output data in the 24 hours ending 05/06/20 0830 Filed Weights   05/06/20 0239  Weight: 54.9 kg    Examination:  General exam: Appears calm and comfortable  Respiratory system: decreased breath sounds b/l  Cardiovascular system: S1 & S2 +. No  rubs, gallops or clicks. Gastrointestinal system: Abdomen is nondistended, soft and nontender. Normal bowel sounds heard. Central nervous system: Alert and oriented. Moves all 4 extremities  Psychiatry: Judgement and insight appear normal. Flat mood and affect    Data Reviewed: I have personally reviewed following labs and imaging studies  CBC: Recent Labs  Lab 05/06/20 0214  WBC 12.8*  NEUTROABS 5.7  HGB 12.0*  HCT 38.2*  MCV 109.5*  PLT 606   Basic Metabolic Panel: Recent Labs  Lab 05/06/20 0214  NA 145  K 5.0  CL 106  CO2 24  GLUCOSE 170*  BUN 41*  CREATININE 6.77*  CALCIUM 8.9  MG 2.5*   GFR: Estimated Creatinine Clearance: 7.5 mL/min (A) (by C-G formula based on SCr of 6.77 mg/dL (H)). Liver Function Tests: Recent Labs  Lab 05/06/20 0214  AST 26  ALT 13  ALKPHOS 97  BILITOT 0.8  PROT 7.9  ALBUMIN 4.0   No results for input(s): LIPASE, AMYLASE in the last 168 hours. No results for input(s): AMMONIA in the last 168 hours. Coagulation Profile: Recent Labs  Lab 05/06/20 0214  INR 1.4*   Cardiac Enzymes: No results for input(s): CKTOTAL, CKMB, CKMBINDEX, TROPONINI in the last 168 hours. BNP (last 3 results) No results for input(s): PROBNP in the last 8760 hours. HbA1C: No results for input(s): HGBA1C in the last 72  hours. CBG: No results for input(s): GLUCAP in the last 168 hours. Lipid Profile: No results for input(s): CHOL, HDL, LDLCALC, TRIG, CHOLHDL, LDLDIRECT in the last 72 hours. Thyroid Function Tests: No results for input(s): TSH, T4TOTAL, FREET4, T3FREE, THYROIDAB in the last 72 hours. Anemia Panel: No results for input(s): VITAMINB12, FOLATE, FERRITIN, TIBC, IRON, RETICCTPCT in the last 72 hours. Sepsis Labs: No results for input(s): PROCALCITON, LATICACIDVEN in the last 168 hours.  Recent Results (from the past 240 hour(s))  Resp Panel by RT-PCR (Flu A&B, Covid) Nasopharyngeal Swab     Status: None   Collection Time: 05/06/20  2:14 AM   Specimen: Nasopharyngeal Swab; Nasopharyngeal(NP) swabs in vial transport medium  Result Value Ref Range Status   SARS Coronavirus 2 by RT PCR NEGATIVE NEGATIVE Final    Comment: (NOTE) SARS-CoV-2 target nucleic acids are NOT DETECTED.  The SARS-CoV-2 RNA is generally detectable in upper respiratory specimens during the acute phase of infection. The lowest concentration of SARS-CoV-2 viral copies this assay can detect is 138 copies/mL. A negative result does not preclude SARS-Cov-2 infection and should not be used as the sole basis for treatment or other patient management decisions. A negative result may occur with  improper specimen collection/handling, submission of specimen other than nasopharyngeal swab, presence of viral mutation(s) within the areas targeted by this assay, and inadequate number of viral copies(<138 copies/mL). A negative result must be combined with clinical observations, patient history, and epidemiological information. The expected result is Negative.  Fact Sheet for Patients:  EntrepreneurPulse.com.au  Fact Sheet for Healthcare Providers:  IncredibleEmployment.be  This test is no t yet approved or cleared by the [place] FDA and  has been authorized for detection and/or  diagnosis of SARS-CoV-2 by FDA under an Emergency Use Authorization (EUA). This EUA will remain  in effect (meaning this test can be used) for the duration of the COVID-19 declaration under Section 564(b)(1) of the Act, 21 U.S.C.section 360bbb-3(b)(1), unless the authorization is terminated  or revoked sooner.       Influenza A by PCR NEGATIVE NEGATIVE Final   Influenza B by PCR NEGATIVE NEGATIVE Final    Comment: (NOTE) The Xpert Xpress SARS-CoV-2/FLU/RSV plus assay is intended as an aid in the diagnosis of influenza from Nasopharyngeal swab specimens and should not be used as a sole basis for treatment. Nasal washings and aspirates are unacceptable for Xpert Xpress SARS-CoV-2/FLU/RSV testing.  Fact Sheet for Patients: EntrepreneurPulse.com.au  Fact Sheet for Healthcare Providers: IncredibleEmployment.be  This test is not yet approved or cleared by the [place] FDA and has been authorized for detection and/or diagnosis of SARS-CoV-2 by FDA under an Emergency Use Authorization (EUA). This EUA will remain in effect (meaning this  test can be used) for the duration of the COVID-19 declaration under Section 564(b)(1) of the Act, 21 U.S.C. section 360bbb-3(b)(1), unless the authorization is terminated or revoked.  Performed at [facility], [address] [place],  [address]          Radiology Studies: DG Chest [place] 1 View  Result Date: 05/06/2020 CLINICAL DATA:  Shortness of breath EXAM: PORTABLE CHEST 1 VIEW COMPARISON:  04/11/2020 FINDINGS: Unchanged position of right IJ approach hemodialysis catheter. Small pleural effusions with bibasilar opacities. Mild pulmonary edema. IMPRESSION: Mild pulmonary edema with small pleural effusions. Electronically Signed   By: Ulyses Jarred M.D.   On: 05/06/2020 02:49        Scheduled Meds: . apixaban  2.5 mg Oral BID  . carvedilol  6.25 mg Oral BID WC  . Chlorhexidine  Gluconate Cloth  6 each Topical Q0600  . furosemide  40 mg Intravenous Q12H  . irbesartan  150 mg Oral QPM  . sodium chloride flush  3 mL Intravenous Q12H   Continuous Infusions: . sodium chloride    . nitroGLYCERIN Stopped (05/06/20 0727)     LOS: 0 days    Time spent: 31 mins     Wyvonnia Dusky, MD [facility] Pager [phone]  If 7PM-7AM, please contact night-coverage 05/06/2020, 8:30 AM

## 2020-05-06 NOTE — Consult Note (Signed)
   Heart Failure Nurse Navigator Note  HRmrEF 40-45%.  Global hypokinesis.  Grade 2 diastolic dysfunction.  Right ventricular systolic function is normal.  Mild mitral regurgitation.  He presented with sudden onset of shortness of breath.  He had not missed any of his dialysis treatments.  He daughter reports that he had not been eating repair.  Underwent abdominal hernia repair approximately 1 month ago.  Blood pressure on admission was 259/159 with a pulse of 118.   Comorbidities:  End-stage renal disease Anemia Hypertension  He has a history of abdominal aortic repair with endovascular stent. Peripheral vascular disease Right IJ clot   Medication:  Eliquis 2.5 mg twice daily Coreg 6.25 mg twice daily Furosemide 40 mg IV every 12 hours Irbesartan 150 mg in the p.m.   Labs:  Sodium 145, potassium 5, chloride 106, CO2 24, BUN 41, creatinine 6.77, hemoglobin 12, hematocrit 38.2, magnesium 2.5, troponin 54, 59, BNP greater than 4500 and albumin of 4 Blood pressure 167/77 Weight 54.9 kg BMI 17.12   Assessment:  General-he is awake and alert lying on a gurney in the ED, daughter is present at his bedside  HEENT-pupils are equal, normocephalic, nonicteric.   Cardiac-heart tones are regular rate and rhythm no murmurs or rubs appreciated   Chest-breath sounds are clear to anterior auscultation  Musculoskeletal-there is no lower extremity edema.   Initial visit with patient and daughter.  She states that since his surgery he had not been eating very healthy, likes to eat fast foods.  States that he does not use salt at the table.  Daughter goes on to say that her mother is main cook makes things from scratch maintaining a low-sodium diet.  Discussed  daily weights, which he had not been doing and recording, states that they do have a scale.  Talked  about what to report to physician as far as signs and symptoms along with the weight increases.   Discussed heart failure  teaching booklet, also the zone magnet and flyer as a guide.  Pricilla Riffle RN, CHFN

## 2020-05-06 NOTE — H&P (Signed)
History and Physical    Scott Oconnor TOI:[id number] DOB: 1945/07/27 DOA: 05/06/2020  PCP: [name], MD   Patient coming from: home  I have personally briefly reviewed patient's old medical records in [place]  Chief Complaint: Shortness of breath  HPI: Scott Oconnor is a 74 y.o. 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  Blood pressure at home was found to be elevated and daughter administered hydralazine 100 mg.  No missed dialysis session.  History limited due to shortness of breath and is provided by daughter at bedside who is his main caregiver.  Daughter states that she noticed that her father's legs were swelling over the past couple days and that he was a little short winded on walking over the past 1 day.  He has not missed any dialysis sessions but she states that he had hernia repair a month ago and since then has been eating very little and lost a lot of weight and she believes his dry weight has decreased but it was not adjusted in dialysis so she feels as though adequate fluid was not being taken off during his dialysis sessions. ED Course: On arrival patient was in acute respiratory distress, BP 259/159, pulse 118, respirations 26.  Patient on BiPAP satting at 100%.  Blood work with WBC 12800, hemoglobin 12, CMP with renal function as expected for dialysis without hyperkalemia and with normal bicarb.  Troponin 54, magnesium 2.5, BNP over 4500. EKG not yet done Imaging: Mild pulmonary edema with small pleural effusions Patient started on nitroglycerin infusion.  Hospitalist consulted for admission.  Review of Systems: Unable to obtain as patient on BiPAP   Past Medical History:  Diagnosis Date  .  Hyperlipidemia   . Hypertension   . PVD (peripheral vascular disease) ([place])   . Renal disorder    ESRD  . Reynolds syndrome [facility])     Past Surgical History:  Procedure Laterality Date  . DIALYSIS/PERMA CATHETER INSERTION N/A 09/26/2019   Procedure: DIALYSIS/PERMA CATHETER INSERTION;  Surgeon: Algernon Huxley, MD;  Location: [place] [facility];  Service: Cardiovascular;  Laterality: N/A;  . ENDOVASCULAR REPAIR/STENT GRAFT N/A 08/14/2019   Procedure: ENDOVASCULAR REPAIR/STENT GRAFT;  Surgeon: Katha Cabal, MD;  Location: [place] [facility];  Service: Cardiovascular;  Laterality: N/A;     reports that he has quit smoking. His smoking use included cigarettes. He smoked 0.33 packs per day. He has never used smokeless tobacco. He reports that he does not drink alcohol and does not use drugs.  Allergies  Allergen Reactions  . Ivp Dye [Iodinated Diagnostic Agents]     ESRD patient     Family History  Problem Relation Age of Onset  . Heart disease Father   . Sudden Cardiac Death Father   . Heart disease Brother   . Hyperlipidemia Brother   . Heart disease Sister   . Heart disease Brother   . Hyperlipidemia Brother   . Hyperlipidemia Brother   . Hyperlipidemia Brother   . Hyperlipidemia Brother   . Hyperlipidemia Brother   . Heart disease Sister       Prior to Admission medications   Medication Sig Start Date End Date Taking? Authorizing Provider  acetaminophen (TYLENOL) 500 MG tablet Take 500-1,000 mg by  mouth every 8 (eight) hours as needed for mild pain or moderate pain.     [provider]  albuterol (VENTOLIN HFA) 108 (90 Base) MCG/ACT inhaler Inhale 2 puffs into the lungs every 6 (six) hours as needed for wheezing or shortness of breath. 11/23/19   Pokhrel, Corrie Mckusick, MD  apixaban (ELIQUIS) 2.5 MG TABS tablet Take 1 tablet (2.5 mg total) by mouth 2 (two) times daily. 12/03/19   Loel Dubonnet, NP  atorvastatin (LIPITOR) 40 MG tablet Take 1 tablet (40 mg total) by  mouth daily. Patient taking differently: Take 40 mg by mouth every evening.  11/24/19   Pokhrel, Corrie Mckusick, MD  calcium carbonate (TUMS - DOSED IN MG ELEMENTAL CALCIUM) 500 MG chewable tablet Chew 1 tablet by mouth 3 (three) times daily after meals.    [provider]  carvedilol (COREG) 6.25 MG tablet Take 1 tablet (6.25 mg total) by mouth 2 (two) times daily with a meal. 11/23/19   Pokhrel, Laxman, MD  docusate sodium (COLACE) 100 MG capsule Take 1 capsule (100 mg total) by mouth 2 (two) times daily as needed for mild constipation. 11/23/19   Pokhrel, Corrie Mckusick, MD  gabapentin (NEURONTIN) 300 MG capsule Take 300 mg by mouth at bedtime.  11/09/19   [provider]  guaiFENesin (MUCINEX) 600 MG 12 hr tablet Take 600 mg by mouth 2 (two) times daily as needed for cough or to loosen phlegm.    [provider]  hydrALAZINE (APRESOLINE) 25 MG tablet Take 25 mg by mouth 3 (three) times daily.     [provider]  irbesartan (AVAPRO) 150 MG tablet Take 150 mg by mouth every evening.  10/22/19   [provider]  multivitamin (RENA-VIT) TABS tablet Take 1 tablet by mouth at bedtime. 09/30/19   Fritzi Mandes, MD  torsemide (DEMADEX) 20 MG tablet Take 2 tablets (40 mg total) by mouth daily. 11/23/19   Flora Lipps, MD    Physical Exam: Vitals:   05/06/20 0335 05/06/20 0345 05/06/20 0349 05/06/20 0351  BP: (!) 155/80 137/86 (!) 170/81 (!) 142/84  Pulse: 77 74 75 79  Resp: 17 16 (!) 22 (!) 21  SpO2: 100% 100% 100% 100%  Weight:      Height:         Vitals:   05/06/20 0335 05/06/20 0345 05/06/20 0349 05/06/20 0351  BP: (!) 155/80 137/86 (!) 170/81 (!) 142/84  Pulse: 77 74 75 79  Resp: 17 16 (!) 22 (!) 21  SpO2: 100% 100% 100% 100%  Weight:      Height:          Constitutional:  Lethargic and ill-appearing , on BiPAP with increased work of breathing HEENT:      Head: Normocephalic and atraumatic.         Eyes: PERLA, , Conjunctivae are normal. Sclera is  non-icteric.       Mouth/Throat:  Unable to obtain is on BiPAP      Neck: Supple with no signs of meningismus. Cardiovascular:  Tachypneic. No murmurs, gallops, or rubs. 2+ symmetrical distal pulses are present . No JVD.  Trace LE edema Respiratory: Respiratory effort increased with bibasilar crackles Gastrointestinal: Soft, non tender, and non distended with positive bowel sounds.  Genitourinary: No CVA tenderness. Musculoskeletal: Nontender with normal range of motion in all extremities. No cyanosis, or erythema of extremities. Neurologic:  Face is symmetric. Moving all extremities. No gross focal neurologic deficits . Skin: Skin is warm, dry.  No rash or  ulcers Psychiatric: Patient drowsy unable to assess   Labs on Admission: I have personally reviewed following labs and imaging studies  CBC: Recent Labs  Lab 05/06/20 0214  WBC 12.8*  NEUTROABS 5.7  HGB 12.0*  HCT 38.2*  MCV 109.5*  PLT 916   Basic Metabolic Panel: Recent Labs  Lab 05/06/20 0214  NA 145  K 5.0  CL 106  CO2 24  GLUCOSE 170*  BUN 41*  CREATININE 6.77*  CALCIUM 8.9  MG 2.5*   GFR: Estimated Creatinine Clearance: 7.5 mL/min (A) (by C-G formula based on SCr of 6.77 mg/dL (H)). Liver Function Tests: Recent Labs  Lab 05/06/20 0214  AST 26  ALT 13  ALKPHOS 97  BILITOT 0.8  PROT 7.9  ALBUMIN 4.0   No results for input(s): LIPASE, AMYLASE in the last 168 hours. No results for input(s): AMMONIA in the last 168 hours. Coagulation Profile: Recent Labs  Lab 05/06/20 0214  INR 1.4*   Cardiac Enzymes: No results for input(s): CKTOTAL, CKMB, CKMBINDEX, TROPONINI in the last 168 hours. BNP (last 3 results) No results for input(s): PROBNP in the last 8760 hours. HbA1C: No results for input(s): HGBA1C in the last 72 hours. CBG: No results for input(s): GLUCAP in the last 168 hours. Lipid Profile: No results for input(s): CHOL, HDL, LDLCALC, TRIG, CHOLHDL, LDLDIRECT in the last 72 hours. Thyroid  Function Tests: No results for input(s): TSH, T4TOTAL, FREET4, T3FREE, THYROIDAB in the last 72 hours. Anemia Panel: No results for input(s): VITAMINB12, FOLATE, FERRITIN, TIBC, IRON, RETICCTPCT in the last 72 hours. Urine analysis:    Component Value Date/Time   COLORURINE YELLOW (A) 09/20/2019 1418   APPEARANCEUR CLEAR (A) 09/20/2019 1418   LABSPEC 1.009 09/20/2019 1418   PHURINE 6.0 09/20/2019 1418   GLUCOSEU NEGATIVE 09/20/2019 1418   HGBUR NEGATIVE 09/20/2019 1418   BILIRUBINUR NEGATIVE 09/20/2019 1418   BILIRUBINUR negative 09/19/2019 1029   KETONESUR NEGATIVE 09/20/2019 1418   PROTEINUR >=300 (A) 09/20/2019 1418   UROBILINOGEN 0.2 09/19/2019 1029   NITRITE NEGATIVE 09/20/2019 1418   LEUKOCYTESUR NEGATIVE 09/20/2019 1418    Radiological Exams on Admission: DG Chest [place] 1 View  Result Date: 05/06/2020 CLINICAL DATA:  Shortness of breath EXAM: PORTABLE CHEST 1 VIEW COMPARISON:  04/11/2020 FINDINGS: Unchanged position of right IJ approach hemodialysis catheter. Small pleural effusions with bibasilar opacities. Mild pulmonary edema. IMPRESSION: Mild pulmonary edema with small pleural effusions. Electronically Signed   By: Ulyses Jarred M.D.   On: 05/06/2020 02:49     Assessment/Plan 74 year old male with history of ESRD on HD MWF, anemia of CKD, HTN, AAA repair with endovascular stent, chronic systolic heart failure, PVD, history of right IJ clot secondary to dialysis catheter on Eliquis, who presents to the emergency room with sudden onset shortness of breath.  No missed dialysis sessions    Acute pulmonary edema (HCC)   Acute on chronic systolic CHF (congestive heart failure) ([place])   Hypertensive emergency  Acute respiratory failure with hypoxia ([name]) -Patient arrives in acute respiratory distress with BP 259/159, requiring BiPAP -Chest x-ray with pulmonary vascular congestion and small pleural effusions -Continue nitroglycerin infusion -IV Lasix 80 mg twice  daily -Continue home carvedilol and irbesartan -Continue BiPAP and wean as tolerated -Daily weights intake and output monitoring -Patient due for dialysis today.  No missed dialysis sessions    Elevated troponin -Troponin slightly elevated, likely related to demand ischemia.  No chest pain -Get EKG, not done in the ER  PVD (peripheral vascular disease) ([place])   History of AAA (abdominal aortic aneurysm) repair -Continue atorvastatin    End stage renal disease on dialysis ([place]) Anemia of chronic kidney failure, stage 5 ([place]) -Nephrology consulted -Hemoglobin at baseline   History of IJ thrombus, complication of dialysis catheter   Chronic anticoagulation -Continue Eliquis 2.5 twice daily    DVT prophylaxis: Eliquis Code Status: full code  Family Communication: Daughter at bedside Disposition Plan: Back to previous home environment Consults called: Nephrology Status:At the time of admission, it appears that the appropriate admission status for this patient is INPATIENT. This is judged to be reasonable and necessary in order to provide the required intensity of service to ensure the patient's safety given the presenting symptoms, physical exam findings, and initial radiographic and laboratory data in the context of their  Comorbid conditions.   Patient requires inpatient status due to high intensity of service, high risk for further deterioration and high frequency of surveillance required.   I certify that at the point of admission it is my clinical judgment that the patient will require inpatient hospital care spanning beyond [place] MD [facility]     05/06/2020, 3:55 AM

## 2020-05-07 ENCOUNTER — Ambulatory Visit: Payer: Medicare Other

## 2020-05-07 LAB — HEPATITIS B SURFACE ANTIBODY, QUANTITATIVE: Hep B S AB Quant (Post): <3.1 m[IU]/mL — ABNORMAL LOW (ref >9.9)

## 2020-05-08 DIAGNOSIS — Z992 Dependence on renal dialysis: Secondary | ICD-10-CM | POA: Diagnosis not present

## 2020-05-08 DIAGNOSIS — N186 End stage renal disease: Secondary | ICD-10-CM | POA: Diagnosis not present

## 2020-05-11 DIAGNOSIS — J449 Chronic obstructive pulmonary disease, unspecified: Secondary | ICD-10-CM | POA: Diagnosis not present

## 2020-05-11 DIAGNOSIS — N186 End stage renal disease: Secondary | ICD-10-CM | POA: Diagnosis not present

## 2020-05-11 DIAGNOSIS — Z992 Dependence on renal dialysis: Secondary | ICD-10-CM | POA: Diagnosis not present

## 2020-05-12 ENCOUNTER — Ambulatory Visit: Payer: Medicare Other

## 2020-05-13 DIAGNOSIS — N186 End stage renal disease: Secondary | ICD-10-CM | POA: Diagnosis not present

## 2020-05-13 DIAGNOSIS — Z992 Dependence on renal dialysis: Secondary | ICD-10-CM | POA: Diagnosis not present

## 2020-05-14 ENCOUNTER — Ambulatory Visit: Payer: Medicare Other

## 2020-05-15 DIAGNOSIS — Z992 Dependence on renal dialysis: Secondary | ICD-10-CM | POA: Diagnosis not present

## 2020-05-15 DIAGNOSIS — N186 End stage renal disease: Secondary | ICD-10-CM | POA: Diagnosis not present

## 2020-05-18 DIAGNOSIS — Z992 Dependence on renal dialysis: Secondary | ICD-10-CM | POA: Diagnosis not present

## 2020-05-18 DIAGNOSIS — N186 End stage renal disease: Secondary | ICD-10-CM | POA: Diagnosis not present

## 2020-05-18 DIAGNOSIS — Z79899 Other long term (current) drug therapy: Secondary | ICD-10-CM | POA: Diagnosis not present

## 2020-05-18 DIAGNOSIS — D649 Anemia, unspecified: Secondary | ICD-10-CM | POA: Diagnosis not present

## 2020-05-19 ENCOUNTER — Ambulatory Visit: Payer: Medicare Other

## 2020-05-20 DIAGNOSIS — Z992 Dependence on renal dialysis: Secondary | ICD-10-CM | POA: Diagnosis not present

## 2020-05-20 DIAGNOSIS — N186 End stage renal disease: Secondary | ICD-10-CM | POA: Diagnosis not present

## 2020-05-21 ENCOUNTER — Ambulatory Visit: Payer: Medicare Other

## 2020-05-22 DIAGNOSIS — Z992 Dependence on renal dialysis: Secondary | ICD-10-CM | POA: Diagnosis not present

## 2020-05-22 DIAGNOSIS — N186 End stage renal disease: Secondary | ICD-10-CM | POA: Diagnosis not present

## 2020-05-25 DIAGNOSIS — Z992 Dependence on renal dialysis: Secondary | ICD-10-CM | POA: Diagnosis not present

## 2020-05-25 DIAGNOSIS — N186 End stage renal disease: Secondary | ICD-10-CM | POA: Diagnosis not present

## 2020-05-27 DIAGNOSIS — N186 End stage renal disease: Secondary | ICD-10-CM | POA: Diagnosis not present

## 2020-05-27 DIAGNOSIS — Z992 Dependence on renal dialysis: Secondary | ICD-10-CM | POA: Diagnosis not present

## 2020-05-29 DIAGNOSIS — N186 End stage renal disease: Secondary | ICD-10-CM | POA: Diagnosis not present

## 2020-05-29 DIAGNOSIS — Z992 Dependence on renal dialysis: Secondary | ICD-10-CM | POA: Diagnosis not present

## 2020-06-01 DIAGNOSIS — Z992 Dependence on renal dialysis: Secondary | ICD-10-CM | POA: Diagnosis not present

## 2020-06-01 DIAGNOSIS — N186 End stage renal disease: Secondary | ICD-10-CM | POA: Diagnosis not present

## 2020-06-02 ENCOUNTER — Encounter: Payer: Self-pay | Admitting: Cardiovascular Disease

## 2020-06-02 ENCOUNTER — Ambulatory Visit (INDEPENDENT_AMBULATORY_CARE_PROVIDER_SITE_OTHER): Payer: Medicare Other | Admitting: Cardiovascular Disease

## 2020-06-02 ENCOUNTER — Other Ambulatory Visit: Payer: Self-pay

## 2020-06-02 VITALS — BP 170/86 | HR 56 | Ht 70.0 in | Wt 120.0 lb

## 2020-06-02 DIAGNOSIS — N186 End stage renal disease: Secondary | ICD-10-CM

## 2020-06-02 DIAGNOSIS — I161 Hypertensive emergency: Secondary | ICD-10-CM | POA: Diagnosis not present

## 2020-06-02 DIAGNOSIS — I714 Abdominal aortic aneurysm, without rupture, unspecified: Secondary | ICD-10-CM

## 2020-06-02 DIAGNOSIS — I5023 Acute on chronic systolic (congestive) heart failure: Secondary | ICD-10-CM | POA: Diagnosis not present

## 2020-06-02 DIAGNOSIS — J449 Chronic obstructive pulmonary disease, unspecified: Secondary | ICD-10-CM | POA: Diagnosis not present

## 2020-06-02 DIAGNOSIS — Z992 Dependence on renal dialysis: Secondary | ICD-10-CM

## 2020-06-02 MED ORDER — HYDRALAZINE HCL 50 MG PO TABS: 100.0000 mg | ORAL_TABLET | Freq: Three times a day (TID) | ORAL | 3 refills | Status: DC

## 2020-06-02 MED ORDER — APIXABAN 2.5 MG PO TABS: 2.5000 mg | ORAL_TABLET | Freq: Two times a day (BID) | ORAL | 1 refills | Status: DC

## 2020-06-02 NOTE — Patient Instructions (Addendum)
You were given free sample of Eliqius 2.5mg  (4 boxes=56 pills).   Medication Instructions:  Hydralazine 100 three times a day  If you need a refill on your cardiac medications before your next appointment, please call your pharmacy.    Lab work: No new labs needed   If you have labs (blood work) drawn today and your tests are completely normal, you will receive your results only by: Marland Kitchen MyChart Message (if you have MyChart) OR . A paper copy in the mail If you have any lab test that is abnormal or we need to change your treatment, we will call you to review the results.   Testing/Procedures: No new testing needed   Follow-Up: At [facility], you and your health needs are our priority.  As part of our continuing mission to provide you with exceptional heart care, we have created designated Provider Care Teams.  These Care Teams include your primary Cardiologist (physician) and Advanced Practice Providers (APPs -  Physician Assistants and Nurse Practitioners) who all work together to provide you with the care you need, when you need it.  . You will need a follow up appointment in 6 months  . Providers on your designated Care Team:   . Murray Hodgkins, NP . Christell Faith, PA-C . Marrianne Mood, PA-C  Any Other Special Instructions Will Be Listed Below (If Applicable).  COVID-19 Vaccine Information can be found at: ShippingScam.co.uk For questions related to vaccine distribution or appointments, please email vaccine@[email or web address] or call [phone].

## 2020-06-02 NOTE — Progress Notes (Signed)
Cardiology Office Note  Date:  06/02/2020   ID:  Scott Oconnor, DOB 26-Feb-1946, MRN [id number]  PCP:  [name], MD   Chief Complaint  Patient presents with  . Follow-up    3 month F/U    HPI:  Scott Oconnor is a 75 y.o. 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 Who presents  For f/u of his  5 cm AAA, stent  On peritoneal dialysis Daughter is helping with set up and learning the process  Dry weight 52 kg, possibly higher Recent evaluation in the emergency room for flash pulmonary edema, underwent urgent dialysis  At baseline is walking, driving Recent high blood pressure possibly from having stitch removed in his abdomen at PD catheter site Sometimes needing extra hydralazine  Sometimes at home pressure running high, in the emergency room recently pressures markedly elevated, above 200  Tired today, was up early for peritoneal dialysis Scheduled to have hemodialysis catheter removed in several weeks time  Prior testing reviewed stress test 11/2019 Pharmacological myocardial perfusion imaging study with no significant  Ischemia GI uptake artifact noted Normal wall motion, EF estimated at 44% No EKG changes concerning for ischemia at peak stress or in recovery. Baseline EKG with LVH, strain pattern, nonspecific ST abnormality CT attenuation correction images with mild three-vessel coronary calcification, mild aortic atherosclerosis,  right pleural effusion, AAA endograft stent noted Low to moderate risk scan  Carotid u/s Mild (1-49%) stenosis proximal b/l internal carotid artery large volume nearly occlusive deep venous thrombosis in the right internal jugular vein.  admitted 11/18/19  to [facility] with  shortness of breath, malaise, palpitations.    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  PSH:    Past Surgical History:  Procedure Laterality Date  . DIALYSIS/PERMA CATHETER INSERTION N/A 09/26/2019   Procedure: DIALYSIS/PERMA CATHETER INSERTION;  Surgeon: Algernon Huxley, MD;  Location: [place] [facility];  Service: Cardiovascular;  Laterality: N/A;  . ENDOVASCULAR REPAIR/STENT GRAFT N/A 08/14/2019   Procedure: ENDOVASCULAR REPAIR/STENT GRAFT;  Surgeon: Katha Cabal, MD;  Location: [place] [facility];  Service: Cardiovascular;  Laterality: N/A;    Current Outpatient Medications  Medication Sig Dispense Refill  . acetaminophen (TYLENOL) 500 MG tablet Take 500-1,000 mg by mouth every 8 (eight) hours as needed for mild pain or moderate pain.     Marland Kitchen apixaban (ELIQUIS) 2.5 MG TABS tablet Take 1 tablet (2.5 mg total) by mouth 2 (two) times daily. 60 tablet 1  . atorvastatin (LIPITOR) 40 MG tablet Take 1 tablet (40 mg total) by mouth daily. 30 tablet 2  . calcium carbonate (TUMS - DOSED IN MG ELEMENTAL CALCIUM) 500 MG chewable tablet Chew 1 tablet by mouth 3 (three) times daily as needed for heartburn.    . carvedilol (COREG) 6.25 MG tablet Take 1 tablet (6.25 mg total) by mouth 2 (two) times daily with a meal. 60 tablet 2  .  gabapentin (NEURONTIN) 300 MG capsule Take 300 mg by mouth at bedtime.     . irbesartan (AVAPRO) 150 MG tablet Take 150 mg by mouth every evening.     . multivitamin (RENA-VIT) TABS tablet Take 1 tablet by mouth at bedtime. 30 tablet 2  . torsemide (DEMADEX) 20 MG tablet Take 2 tablets (40 mg total)  by mouth daily. 30 tablet 1  . hydrALAZINE (APRESOLINE) 50 MG tablet Take 2 tablets (100 mg total) by mouth 3 (three) times daily. 540 tablet 3   No current facility-administered medications for this visit.    Allergies:   Ivp dye [iodinated diagnostic agents]   Social History:  The patient  reports that he has quit smoking. His smoking use included cigarettes. He smoked 0.33 packs per day. He has never used smokeless tobacco. He reports that he does not drink alcohol and does not use drugs.   Family History:   family history includes Heart disease in his brother, brother, father, sister, and sister; Hyperlipidemia in his brother, brother, brother, brother, brother, and brother; Sudden Cardiac Death in his father.    Review of Systems: Review of Systems  Constitutional: Negative.   HENT: Negative.   Respiratory: Negative.   Cardiovascular: Negative.   Gastrointestinal: Negative.   Musculoskeletal: Negative.   Neurological: Negative.   Psychiatric/Behavioral: Negative.   All other systems reviewed and are negative.   PHYSICAL EXAM: VS:  BP (!) 170/86 (BP Location: Left Arm, Patient Position: Sitting, Cuff Size: Normal)   Pulse (!) 56   Ht 5\' 10"  (1.778 m)   Wt 120 lb (54.4 kg)   SpO2 96%   BMI 17.22 kg/m  , BMI Body mass index is 17.22 kg/m. Constitutional:  oriented to person, place, and time. No distress.  HENT:  Head: Grossly normal Eyes:  no discharge. No scleral icterus.  Neck: No JVD, no carotid bruits  Cardiovascular: Regular rate and rhythm, no murmurs appreciated Pulmonary/Chest: Clear to auscultation bilaterally, no wheezes or rails Abdominal: Soft.  no distension.  no tenderness.  Musculoskeletal: Normal range of motion Neurological:  normal muscle tone. Coordination normal. No atrophy Skin: Skin warm and dry Psychiatric: normal affect, pleasant  Recent Labs: 04/11/2020: TSH 0.993 05/06/2020: ALT 13; B Natriuretic Peptide >4,500.0; BUN 41; Creatinine, Ser  6.77; Hemoglobin 12.0; Magnesium 2.5; Platelets 154; Potassium 5.0; Sodium 145    Lipid Panel Lab Results  Component Value Date   CHOL 163 11/20/2019   HDL 48 11/20/2019   LDLCALC 90 11/20/2019   TRIG 126 11/20/2019      Wt Readings from Last 3 Encounters:  06/02/20 120 lb (54.4 kg)  05/06/20 121 lb 0.5 oz (54.9 kg)  04/10/20 122 lb (55.3 kg)     ASSESSMENT AND PLAN:  Problem List Items Addressed This Visit      Cardiology Problems   Acute on chronic systolic CHF (congestive heart failure) (HCC) - Primary   Relevant Medications   hydrALAZINE (APRESOLINE) 50 MG tablet   apixaban (ELIQUIS) 2.5 MG TABS tablet   Hypertensive emergency   Relevant Medications   hydrALAZINE (APRESOLINE) 50 MG tablet   apixaban (ELIQUIS) 2.5 MG TABS tablet   AAA (abdominal aortic aneurysm) without rupture (HCC)   Relevant Medications   hydrALAZINE (APRESOLINE) 50 MG tablet   apixaban (ELIQUIS) 2.5 MG TABS tablet     Other   End stage renal disease on dialysis ([place])      AAA/PAD Followed by vascular, endograft placed Stressed importance of aggressive lipid control  Hyperlipidemia Cholesterol at goal, no changes to medications  DVT subclavian vein Eliquis 2.5 twice daily until port removed, family will check with nephrology Samples provided  Smoker Cessation recommended  End-stage renal disease on hemodialysis Moving towards peritoneal dialysis at home We will see how his blood pressure regulates once he gets on a regular schedule Suspect dry weight will need to be little bit lower given pleural effusions, hypertension  Essential hypertension Markedly elevated recently in the emergency room in the setting of flash pulm edema, continues to run high Elevated on today's visit, recommended extra hydralazine, may need hydralazine 100 3 times daily If blood pressure continues to run high, also with persistent small pleural effusions, may need to lower dry weight   Total encounter  time more than 25 minutes  Greater than 50% was spent in counseling and coordination of care with the patient   Signed, Esmond Plants, M.D., Ph.D. [place], [place]

## 2020-06-03 DIAGNOSIS — Z992 Dependence on renal dialysis: Secondary | ICD-10-CM | POA: Diagnosis not present

## 2020-06-03 DIAGNOSIS — N186 End stage renal disease: Secondary | ICD-10-CM | POA: Diagnosis not present

## 2020-06-04 DIAGNOSIS — N186 End stage renal disease: Secondary | ICD-10-CM | POA: Diagnosis not present

## 2020-06-04 DIAGNOSIS — Z79899 Other long term (current) drug therapy: Secondary | ICD-10-CM | POA: Diagnosis not present

## 2020-06-04 DIAGNOSIS — D509 Iron deficiency anemia, unspecified: Secondary | ICD-10-CM | POA: Diagnosis not present

## 2020-06-04 DIAGNOSIS — Z5181 Encounter for therapeutic drug level monitoring: Secondary | ICD-10-CM | POA: Diagnosis not present

## 2020-06-04 DIAGNOSIS — Z992 Dependence on renal dialysis: Secondary | ICD-10-CM | POA: Diagnosis not present

## 2020-06-04 DIAGNOSIS — E785 Hyperlipidemia, unspecified: Secondary | ICD-10-CM | POA: Diagnosis not present

## 2020-06-05 DIAGNOSIS — Z992 Dependence on renal dialysis: Secondary | ICD-10-CM | POA: Diagnosis not present

## 2020-06-05 DIAGNOSIS — N186 End stage renal disease: Secondary | ICD-10-CM | POA: Diagnosis not present

## 2020-06-08 ENCOUNTER — Telehealth: Payer: Self-pay | Admitting: Cardiovascular Disease

## 2020-06-08 ENCOUNTER — Emergency Department
Admission: EM | Admit: 2020-06-08 | Discharge: 2020-06-08 | Disposition: A | Discharge status: Home / Self Care | Payer: Medicare Other | Attending: Emergency Medicine | Admitting: Emergency Medicine

## 2020-06-08 ENCOUNTER — Emergency Department: Payer: Medicare Other

## 2020-06-08 ENCOUNTER — Other Ambulatory Visit: Payer: Self-pay

## 2020-06-08 ENCOUNTER — Encounter: Payer: Self-pay | Admitting: Emergency Medicine

## 2020-06-08 DIAGNOSIS — N185 Chronic kidney disease, stage 5: Secondary | ICD-10-CM | POA: Insufficient documentation

## 2020-06-08 DIAGNOSIS — Z992 Dependence on renal dialysis: Secondary | ICD-10-CM | POA: Diagnosis not present

## 2020-06-08 DIAGNOSIS — Z79899 Other long term (current) drug therapy: Secondary | ICD-10-CM | POA: Insufficient documentation

## 2020-06-08 DIAGNOSIS — Z87891 Personal history of nicotine dependence: Secondary | ICD-10-CM | POA: Insufficient documentation

## 2020-06-08 DIAGNOSIS — N186 End stage renal disease: Secondary | ICD-10-CM | POA: Diagnosis not present

## 2020-06-08 DIAGNOSIS — R55 Syncope and collapse: Secondary | ICD-10-CM | POA: Diagnosis not present

## 2020-06-08 DIAGNOSIS — R4020 Unspecified coma: Secondary | ICD-10-CM | POA: Diagnosis not present

## 2020-06-08 DIAGNOSIS — R6889 Other general symptoms and signs: Secondary | ICD-10-CM | POA: Diagnosis not present

## 2020-06-08 DIAGNOSIS — I499 Cardiac arrhythmia, unspecified: Secondary | ICD-10-CM | POA: Diagnosis not present

## 2020-06-08 DIAGNOSIS — I132 Hypertensive heart and chronic kidney disease with heart failure and with stage 5 chronic kidney disease, or end stage renal disease: Secondary | ICD-10-CM | POA: Diagnosis not present

## 2020-06-08 DIAGNOSIS — R404 Transient alteration of awareness: Secondary | ICD-10-CM | POA: Diagnosis not present

## 2020-06-08 DIAGNOSIS — I5022 Chronic systolic (congestive) heart failure: Secondary | ICD-10-CM | POA: Diagnosis not present

## 2020-06-08 DIAGNOSIS — Z7901 Long term (current) use of anticoagulants: Secondary | ICD-10-CM | POA: Diagnosis not present

## 2020-06-08 DIAGNOSIS — Z743 Need for continuous supervision: Secondary | ICD-10-CM | POA: Diagnosis not present

## 2020-06-08 DIAGNOSIS — I120 Hypertensive chronic kidney disease with stage 5 chronic kidney disease or end stage renal disease: Secondary | ICD-10-CM | POA: Diagnosis not present

## 2020-06-08 LAB — CBC
HCT: 32.6 % — ABNORMAL LOW (ref 39.0–52.0)
Hemoglobin: 10.5 g/dL — ABNORMAL LOW (ref 13.0–17.0)
MCH: 34.1 pg — ABNORMAL HIGH (ref 26.0–34.0)
MCHC: 32.2 g/dL (ref 30.0–36.0)
MCV: 105.8 fL — ABNORMAL HIGH (ref 80.0–100.0)
Platelets: 95 10*3/uL — ABNORMAL LOW (ref 150–400)
RBC: 3.08 MIL/uL — ABNORMAL LOW (ref 4.22–5.81)
RDW: 15.6 % — ABNORMAL HIGH (ref 11.5–15.5)
WBC: 8.2 10*3/uL (ref 4.0–10.5)
nRBC: 0 % (ref 0.0–0.2)

## 2020-06-08 LAB — BASIC METABOLIC PANEL
Anion gap: 18 — ABNORMAL HIGH (ref 5–15)
BUN: 70 mg/dL — ABNORMAL HIGH (ref 8–23)
CO2: 23 mmol/L (ref 22–32)
Calcium: 8.7 mg/dL — ABNORMAL LOW (ref 8.9–10.3)
Chloride: 101 mmol/L (ref 98–111)
Creatinine, Ser: 11.51 mg/dL — ABNORMAL HIGH (ref 0.61–1.24)
GFR, Estimated: 4 mL/min — ABNORMAL LOW (ref >60)
Glucose, Bld: 117 mg/dL — ABNORMAL HIGH (ref 70–99)
Potassium: 4.9 mmol/L (ref 3.5–5.1)
Sodium: 142 mmol/L (ref 135–145)

## 2020-06-08 LAB — TROPONIN I (HIGH SENSITIVITY): Troponin I (High Sensitivity): 40 ng/L — ABNORMAL HIGH (ref <18)

## 2020-06-08 NOTE — Telephone Encounter (Signed)
Patient daughter Scott Oconnor calling States that patient was at dialysis and noticed potassium was high- had patient go to emergency room T[place] requesting to speak with Dr [name] Please call to discuss

## 2020-06-08 NOTE — ED Triage Notes (Signed)
Pt to ED via EMS pt approx 25 mins into dialysis, went unresponsive, placed in chair, became responsive. Per EMS pt's nephrologist refused to continue dialysis when patient initially refused to come to ED. Pt denies any complaints, dialysis catheter to L upper chest at this time. Pt A&O X4.

## 2020-06-08 NOTE — ED Triage Notes (Signed)
Pt in via EMS from dialysis with c/o   Pt 25 minutes into dialysis and went unresponsive. Pt not responsive to sternal rub, and came back to. 190/76, HR 66, a-fib on ems monitor, no hx of the same, 100% RA.

## 2020-06-08 NOTE — Discharge Instructions (Signed)
Your potassium was 4.9 today.  CT scan of your brain was normal.   Please continue your peritoneal dialysis and follow up with Dr. [name].   Return to the ED with any further episodes of passing out, particularly outside of dialysis or with associated chest pain

## 2020-06-08 NOTE — ED Provider Notes (Signed)
[facility] Emergency Department Provider Note ____________________________________________   Event Date/Time   First MD Initiated Contact with Patient 06/08/20 1451     (approximate)  I have reviewed the triage vital signs and the nursing notes.  HISTORY  Chief Complaint Loss of Consciousness   HPI Scott Oconnor is a 75 y.o. malewho presents to the ED for evaluation of syncope.   Chart review indicates ESRD on iHD, PAD, on eliquis. 5cm AAA. HTN, HLD.   Patient reports currently transitioning from intermittent hemodialysis to peritoneal dialysis.  He has a right chest port in place, and a peritoneal dialysis catheter.  He reports previously being on M, W, F hemodialysis, prior today was last dialyzed 1 week ago.  He reports living with his daughter and daughter has been helping him perform peritoneal dialysis daily for the past 1 week.  They followed up with hemodialysis for the first time today in 1 week.  About 20 minutes into his hemodialysis session today, patient went suddenly unresponsive.  He was reportedly unresponsive to sternal rub temporarily, before beginning consciousness.  No noted seizure activity.  No subsequent syncopal episodes.  Here in the ED, patient reports he feels well and has no complaints.  Denies any preceding headache, chest pain, palpitations, back or abdominal pain.  He reports he was "just sitting there and then EMS was putting me on a stretcher."  I speak with daughter over the phone and she reports explicit concern about his potassium and phosphorus levels due to an episode such as this previously occurring, and related to these electrolyte values.  Past Medical History:  Diagnosis Date  . Hyperlipidemia   . Hypertension   . PVD (peripheral vascular disease) ([place])   . Renal disorder    ESRD  . Reynolds syndrome [facility])     Patient Active Problem List   Diagnosis Date Noted  . Anemia of chronic kidney failure, stage 5  ([place]) 05/06/2020  . Acute on chronic systolic CHF (congestive heart failure) ([place]) 05/06/2020  . Hypertensive emergency 05/06/2020  . Elevated troponin 05/06/2020  . Acute CHF (congestive heart failure) ([place]) 05/06/2020  . Acute respiratory failure with hypoxia ([place]) 05/06/2020  . Chronic anticoagulation 05/06/2020  . End stage renal disease on dialysis ([place]) 04/11/2020  . Altered mental status 04/10/2020  . HFrEF (heart failure with reduced ejection fraction) ([place])   . Acute pulmonary edema ([place]) 11/18/2019  . Protein-calorie malnutrition, severe 09/27/2019  . Acute kidney failure, unspecified ([place])   . History of AAA (abdominal aortic aneurysm) repair   . Anemia   . Thrombocytopenia ([place])   . Acute kidney injury superimposed on CKD ([place])   . Essential hypertension   . Hyperlipidemia   . Fatigue 09/19/2019  . AAA (abdominal aortic aneurysm) ([place]) 08/14/2019  . AAA (abdominal aortic aneurysm) without rupture ([place]) 08/01/2019  . PVD (peripheral vascular disease) ([place]) 07/24/2019  . Tobacco abuse 07/24/2019  . Raynaud's disease without gangrene 06/21/2019  . Mixed dyslipidemia 06/15/2018  . Family history of heart attack 06/15/2018  . Smokers' cough ([place]) 06/16/2017    Past Surgical History:  Procedure Laterality Date  . DIALYSIS/PERMA CATHETER INSERTION N/A 09/26/2019   Procedure: DIALYSIS/PERMA CATHETER INSERTION;  Surgeon: Algernon Huxley, MD;  Location: [place] [facility];  Service: Cardiovascular;  Laterality: N/A;  . ENDOVASCULAR REPAIR/STENT GRAFT N/A 08/14/2019   Procedure: ENDOVASCULAR REPAIR/STENT GRAFT;  Surgeon: Katha Cabal, MD;  Location: [place] [facility];  Service: Cardiovascular;  Laterality: N/A;  Prior to Admission medications   Medication Sig Start Date End Date Taking? Authorizing Provider  acetaminophen (TYLENOL) 500 MG tablet Take 500-1,000 mg by mouth every 8 (eight) hours as needed for mild pain or moderate pain.     [provider]   apixaban (ELIQUIS) 2.5 MG TABS tablet Take 1 tablet (2.5 mg total) by mouth 2 (two) times daily. 06/02/20   [name], MD  atorvastatin (LIPITOR) 40 MG tablet Take 1 tablet (40 mg total) by mouth daily. 11/24/19   Pokhrel, Corrie Mckusick, MD  calcium carbonate (TUMS - DOSED IN MG ELEMENTAL CALCIUM) 500 MG chewable tablet Chew 1 tablet by mouth 3 (three) times daily as needed for heartburn.    [provider]  carvedilol (COREG) 6.25 MG tablet Take 1 tablet (6.25 mg total) by mouth 2 (two) times daily with a meal. 11/23/19   Pokhrel, Laxman, MD  gabapentin (NEURONTIN) 300 MG capsule Take 300 mg by mouth at bedtime.  11/09/19   [provider]  hydrALAZINE (APRESOLINE) 50 MG tablet Take 2 tablets (100 mg total) by mouth 3 (three) times daily. 06/02/20   [name], MD  irbesartan (AVAPRO) 150 MG tablet Take 150 mg by mouth every evening.  10/22/19   [provider]  multivitamin (RENA-VIT) TABS tablet Take 1 tablet by mouth at bedtime. 09/30/19   Fritzi Mandes, MD  torsemide (DEMADEX) 20 MG tablet Take 2 tablets (40 mg total) by mouth daily. 11/23/19   Pokhrel, Corrie Mckusick, MD    Allergies Ivp dye [iodinated diagnostic agents]  Family History  Problem Relation Age of Onset  . Heart disease Father   . Sudden Cardiac Death Father   . Heart disease Brother   . Hyperlipidemia Brother   . Heart disease Sister   . Heart disease Brother   . Hyperlipidemia Brother   . Hyperlipidemia Brother   . Hyperlipidemia Brother   . Hyperlipidemia Brother   . Hyperlipidemia Brother   . Heart disease Sister     Social History Social History   Tobacco Use  . Smoking status: Former Smoker    Packs/day: 0.33    Types: Cigarettes  . Smokeless tobacco: Never Used  . Tobacco comment: 6 cigarettes daily   Vaping Use  . Vaping Use: Never used  Substance Use Topics  . Alcohol use: No  . Drug use: No    Review of Systems  Constitutional: No fever/chills Eyes: No visual changes. ENT:  No sore throat. Cardiovascular: Denies chest pain. Respiratory: Denies shortness of breath. Gastrointestinal: No abdominal pain.  No nausea, no vomiting.  No diarrhea.  No constipation. Genitourinary: Negative for dysuria. Musculoskeletal: Negative for back pain. Skin: Negative for rash. Neurological: Negative for headaches, focal weakness or numbness.  Positive for syncope  ____________________________________________   PHYSICAL EXAM:  VITAL SIGNS: Vitals:   06/08/20 1440  BP: (!) 142/58  Pulse: 67  Resp: 20  Temp: 97.7 F (36.5 C)  SpO2: 96%     Constitutional: Alert and oriented. Well appearing and in no acute distress. Sitting up in a bedside wheelchair, conversational.  Eyes: Conjunctivae are normal. PERRL. EOMI. Head: Atraumatic. Nose: No congestion/rhinnorhea. Mouth/Throat: Mucous membranes are moist.  Oropharynx non-erythematous. Neck: No stridor. No cervical spine tenderness to palpation. Cardiovascular: Normal rate, regular rhythm. Grossly normal heart sounds.  Good peripheral circulation. Respiratory: Normal respiratory effort.  No retractions. Lungs CTAB. Gastrointestinal: Soft , nondistended, nontender to palpation. No CVA tenderness. PD catheter in place, clean insertion site.  Musculoskeletal: No  lower extremity tenderness nor edema.  No joint effusions. No signs of acute trauma. Right chest tunneled catheter in place without surrounding erythema, induration or purulence. Neurologic:  Normal speech and language. No gross focal neurologic deficits are appreciated. No gait instability noted. Skin:  Skin is warm, dry and intact. No rash noted. Psychiatric: Mood and affect are normal. Speech and behavior are normal.  ____________________________________________   LABS (all labs ordered are listed, but only abnormal results are displayed)  Labs Reviewed  BASIC METABOLIC PANEL - Abnormal; Notable for the following components:      Result Value   Glucose, Bld  117 (*)    BUN 70 (*)    Creatinine, Ser 11.51 (*)    Calcium 8.7 (*)    GFR, Estimated 4 (*)    Anion gap 18 (*)    All other components within normal limits  CBC - Abnormal; Notable for the following components:   RBC 3.08 (*)    Hemoglobin 10.5 (*)    HCT 32.6 (*)    MCV 105.8 (*)    MCH 34.1 (*)    RDW 15.6 (*)    Platelets 95 (*)    All other components within normal limits  TROPONIN I (HIGH SENSITIVITY) - Abnormal; Notable for the following components:   Troponin I (High Sensitivity) 40 (*)    All other components within normal limits  CBG MONITORING, ED  TROPONIN I (HIGH SENSITIVITY)   ____________________________________________  12 Lead EKG  Sinus rhythm, rate of 69 bpm.  Leftward axis.  Normal intervals.  Lateral T wave inversions and ST depressions that is baseline per comparison EKG from 12/15 ____________________________________________  RADIOLOGY  ED MD interpretation: CT head reviewed by me without evidence of acute intracranial pathology.  Official radiology report(s): CT Head Wo Contrast  Result Date: 06/08/2020 CLINICAL DATA:  Altered mental status. EXAM: CT HEAD WITHOUT CONTRAST TECHNIQUE: Contiguous axial images were obtained from the base of the skull through the vertex without intravenous contrast. COMPARISON:  April 10, 2020 FINDINGS: Brain: There is mild cerebral atrophy with widening of the extra-axial spaces and ventricular dilatation. There are areas of decreased attenuation within the white matter tracts of the supratentorial brain, consistent with microvascular disease changes. 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 A small area of cortical encephalomalacia, with adjacent chronic white matter low attenuation is seen within the cerebellum on the right. Vascular: No hyperdense vessel or unexpected calcification. Skull: Normal. Negative for fracture or focal lesion. Sinuses/Orbits: There is mild to moderate severity right ethmoid  sinus mucosal thickening. Other: None. IMPRESSION: 1. Generalized cerebral atrophy. 2. No acute intracranial abnormality. Electronically Signed   By: Virgina Norfolk M.D.   On: 06/08/2020 16:40    ____________________________________________   PROCEDURES and INTERVENTIONS  Procedure(s) performed (including Critical Care):  .1-3 Lead EKG Interpretation Performed by: Vladimir Crofts, MD Authorized by: Vladimir Crofts, MD     Interpretation: normal     ECG rate:  70   ECG rate assessment: normal     Rhythm: sinus rhythm     Ectopy: none     Conduction: normal      Medications - No data to display  ____________________________________________   MDM / ED COURSE   75 year old male with ESRD on dialysis presents to the ED after syncopal episode that occurred during dialysis, likely due to associated fluid shifts, and amenable to outpatient management.  Normal vitals on room air.  Exam without evidence of acute pathology.  Benign  abdomen without signs of SBP.  No neurovascular deficits or signs of trauma.  Blood work without evidence of hyperkalemia, and no emergent indication for hemodialysis.  Discussed the case with nephrologist on-call, who recommends continued outpatient management for peritoneal dialysis and following up with her outpatient nephrologist.  CT head reviewed without evidence of acute intracranial hemorrhage.  Patient tolerating p.o. intake and asymptomatic throughout his stay here in the ED.  Will discharge with close return precautions.   Clinical Course as of 06/08/20 1643  Mon Jun 08, 2020  1610 Reassessed.  Patient reports he feels well.  No complaints. [DS]  [place] Dr. Holley Raring, nephrology on call, who had already heard of the patient previously from outpatient nephro, Dr. [name].  We discussed benign blood work without evidence of hyperkalemia.  We discussed reassuring clinical picture. Dr. Holley Raring expresses no additional concerns and indicates patient would be  suitable for outpatient management from his perspective. I further discussed work-up with patient's daughter over the telephone, and she is thankful for the care provided.  We discussed likely outpatient management, pending CT head, and she is agreeable.  [DS]    Clinical Course User Index [DS] Vladimir Crofts, MD    ____________________________________________   FINAL CLINICAL IMPRESSION(S) / ED DIAGNOSES  Final diagnoses:  Syncope and collapse  ESRD (end stage renal disease) on dialysis [facility])     ED Discharge Orders    None       Andriy Sherk   Note:  This document was prepared using Dragon voice recognition software and may include unintentional dictation errors.   Vladimir Crofts, MD 06/08/20 [phone]

## 2020-06-08 NOTE — Telephone Encounter (Signed)
Able to speak with daughter, she is primary caregiver and was with pt at last visit this month with Dr. [name]. She stated pt's K+ was elevated this am with dialysis and his phosphatase was also elevated. Pt now being seen in the ED at [facility], they are repeating labs there. Daughter [name], is concern and wanted to update Dr. [name] of pt's status, she is also concern that CT dye may be given for testing and do not want to further damage his kidneys, advise she can refuse, but she is okay with cardiac EKG and things of that nature. Advise [name] would send Dr. [name] a message to make him aware as she request. [name] is thankful and will keep in touch with pt's progress for Dr. [name] to be aware.

## 2020-06-11 DIAGNOSIS — J449 Chronic obstructive pulmonary disease, unspecified: Secondary | ICD-10-CM | POA: Diagnosis not present

## 2020-06-15 ENCOUNTER — Other Ambulatory Visit: Payer: Medicare Other

## 2020-06-15 DIAGNOSIS — N186 End stage renal disease: Secondary | ICD-10-CM | POA: Diagnosis not present

## 2020-06-15 DIAGNOSIS — Z992 Dependence on renal dialysis: Secondary | ICD-10-CM | POA: Diagnosis not present

## 2020-06-16 DIAGNOSIS — Z992 Dependence on renal dialysis: Secondary | ICD-10-CM | POA: Diagnosis not present

## 2020-06-16 DIAGNOSIS — N186 End stage renal disease: Secondary | ICD-10-CM | POA: Diagnosis not present

## 2020-06-17 DIAGNOSIS — N186 End stage renal disease: Secondary | ICD-10-CM | POA: Diagnosis not present

## 2020-06-17 DIAGNOSIS — Z992 Dependence on renal dialysis: Secondary | ICD-10-CM | POA: Diagnosis not present

## 2020-06-18 ENCOUNTER — Inpatient Hospital Stay
Admission: EM | Admit: 2020-06-18 | Discharge: 2020-06-27 | DRG: 377 | Disposition: A | Discharge status: Home Health | Payer: Medicare Other | Attending: Internal Medicine | Admitting: Internal Medicine

## 2020-06-18 ENCOUNTER — Other Ambulatory Visit: Payer: Self-pay

## 2020-06-18 ENCOUNTER — Emergency Department: Payer: Medicare Other

## 2020-06-18 DIAGNOSIS — Z992 Dependence on renal dialysis: Secondary | ICD-10-CM

## 2020-06-18 DIAGNOSIS — U071 COVID-19: Secondary | ICD-10-CM

## 2020-06-18 DIAGNOSIS — J181 Lobar pneumonia, unspecified organism: Secondary | ICD-10-CM | POA: Diagnosis not present

## 2020-06-18 DIAGNOSIS — K5289 Other specified noninfective gastroenteritis and colitis: Secondary | ICD-10-CM | POA: Diagnosis not present

## 2020-06-18 DIAGNOSIS — A0839 Other viral enteritis: Secondary | ICD-10-CM | POA: Diagnosis present

## 2020-06-18 DIAGNOSIS — A4189 Other specified sepsis: Secondary | ICD-10-CM | POA: Diagnosis not present

## 2020-06-18 DIAGNOSIS — R110 Nausea: Secondary | ICD-10-CM | POA: Diagnosis not present

## 2020-06-18 DIAGNOSIS — K922 Gastrointestinal hemorrhage, unspecified: Secondary | ICD-10-CM

## 2020-06-18 DIAGNOSIS — E782 Mixed hyperlipidemia: Secondary | ICD-10-CM | POA: Diagnosis present

## 2020-06-18 DIAGNOSIS — E875 Hyperkalemia: Secondary | ICD-10-CM | POA: Diagnosis not present

## 2020-06-18 DIAGNOSIS — Z7189 Other specified counseling: Secondary | ICD-10-CM | POA: Diagnosis not present

## 2020-06-18 DIAGNOSIS — Z681 Body mass index (BMI) 19 or less, adult: Secondary | ICD-10-CM

## 2020-06-18 DIAGNOSIS — A419 Sepsis, unspecified organism: Secondary | ICD-10-CM

## 2020-06-18 DIAGNOSIS — I7300 Raynaud's syndrome without gangrene: Secondary | ICD-10-CM | POA: Diagnosis not present

## 2020-06-18 DIAGNOSIS — N186 End stage renal disease: Secondary | ICD-10-CM | POA: Diagnosis not present

## 2020-06-18 DIAGNOSIS — N2581 Secondary hyperparathyroidism of renal origin: Secondary | ICD-10-CM | POA: Diagnosis present

## 2020-06-18 DIAGNOSIS — G928 Other toxic encephalopathy: Secondary | ICD-10-CM | POA: Diagnosis not present

## 2020-06-18 DIAGNOSIS — Z800 Family history of malignant neoplasm of digestive organs: Secondary | ICD-10-CM

## 2020-06-18 DIAGNOSIS — R630 Anorexia: Secondary | ICD-10-CM | POA: Diagnosis present

## 2020-06-18 DIAGNOSIS — G9340 Encephalopathy, unspecified: Secondary | ICD-10-CM | POA: Diagnosis not present

## 2020-06-18 DIAGNOSIS — Z7902 Long term (current) use of antithrombotics/antiplatelets: Secondary | ICD-10-CM | POA: Diagnosis not present

## 2020-06-18 DIAGNOSIS — D631 Anemia in chronic kidney disease: Secondary | ICD-10-CM | POA: Diagnosis not present

## 2020-06-18 DIAGNOSIS — D696 Thrombocytopenia, unspecified: Secondary | ICD-10-CM | POA: Diagnosis not present

## 2020-06-18 DIAGNOSIS — R509 Fever, unspecified: Secondary | ICD-10-CM | POA: Diagnosis not present

## 2020-06-18 DIAGNOSIS — K921 Melena: Secondary | ICD-10-CM | POA: Diagnosis not present

## 2020-06-18 DIAGNOSIS — I714 Abdominal aortic aneurysm, without rupture: Secondary | ICD-10-CM | POA: Diagnosis not present

## 2020-06-18 DIAGNOSIS — N185 Chronic kidney disease, stage 5: Secondary | ICD-10-CM | POA: Diagnosis not present

## 2020-06-18 DIAGNOSIS — I517 Cardiomegaly: Secondary | ICD-10-CM | POA: Diagnosis not present

## 2020-06-18 DIAGNOSIS — I5020 Unspecified systolic (congestive) heart failure: Secondary | ICD-10-CM

## 2020-06-18 DIAGNOSIS — Z515 Encounter for palliative care: Secondary | ICD-10-CM | POA: Diagnosis not present

## 2020-06-18 DIAGNOSIS — I120 Hypertensive chronic kidney disease with stage 5 chronic kidney disease or end stage renal disease: Secondary | ICD-10-CM | POA: Diagnosis not present

## 2020-06-18 DIAGNOSIS — J90 Pleural effusion, not elsewhere classified: Secondary | ICD-10-CM | POA: Diagnosis not present

## 2020-06-18 DIAGNOSIS — Z91041 Radiographic dye allergy status: Secondary | ICD-10-CM

## 2020-06-18 DIAGNOSIS — Z8249 Family history of ischemic heart disease and other diseases of the circulatory system: Secondary | ICD-10-CM

## 2020-06-18 DIAGNOSIS — Z86718 Personal history of other venous thrombosis and embolism: Secondary | ICD-10-CM

## 2020-06-18 DIAGNOSIS — J1282 Pneumonia due to coronavirus disease 2019: Secondary | ICD-10-CM | POA: Diagnosis not present

## 2020-06-18 DIAGNOSIS — R059 Cough, unspecified: Secondary | ICD-10-CM

## 2020-06-18 DIAGNOSIS — R1110 Vomiting, unspecified: Secondary | ICD-10-CM | POA: Diagnosis not present

## 2020-06-18 DIAGNOSIS — Z83438 Family history of other disorder of lipoprotein metabolism and other lipidemia: Secondary | ICD-10-CM

## 2020-06-18 DIAGNOSIS — I5022 Chronic systolic (congestive) heart failure: Secondary | ICD-10-CM | POA: Diagnosis not present

## 2020-06-18 DIAGNOSIS — R21 Rash and other nonspecific skin eruption: Secondary | ICD-10-CM | POA: Diagnosis not present

## 2020-06-18 DIAGNOSIS — I10 Essential (primary) hypertension: Secondary | ICD-10-CM

## 2020-06-18 DIAGNOSIS — R531 Weakness: Secondary | ICD-10-CM

## 2020-06-18 DIAGNOSIS — M549 Dorsalgia, unspecified: Secondary | ICD-10-CM | POA: Diagnosis not present

## 2020-06-18 DIAGNOSIS — R6520 Severe sepsis without septic shock: Secondary | ICD-10-CM | POA: Diagnosis not present

## 2020-06-18 DIAGNOSIS — D62 Acute posthemorrhagic anemia: Secondary | ICD-10-CM | POA: Diagnosis present

## 2020-06-18 DIAGNOSIS — R54 Age-related physical debility: Secondary | ICD-10-CM | POA: Diagnosis present

## 2020-06-18 DIAGNOSIS — Z79899 Other long term (current) drug therapy: Secondary | ICD-10-CM

## 2020-06-18 DIAGNOSIS — I132 Hypertensive heart and chronic kidney disease with heart failure and with stage 5 chronic kidney disease, or end stage renal disease: Secondary | ICD-10-CM | POA: Diagnosis not present

## 2020-06-18 DIAGNOSIS — R627 Adult failure to thrive: Secondary | ICD-10-CM | POA: Diagnosis present

## 2020-06-18 DIAGNOSIS — E785 Hyperlipidemia, unspecified: Secondary | ICD-10-CM | POA: Diagnosis not present

## 2020-06-18 DIAGNOSIS — J9811 Atelectasis: Secondary | ICD-10-CM | POA: Diagnosis not present

## 2020-06-18 DIAGNOSIS — Z87891 Personal history of nicotine dependence: Secondary | ICD-10-CM

## 2020-06-18 DIAGNOSIS — Z7901 Long term (current) use of anticoagulants: Secondary | ICD-10-CM | POA: Diagnosis not present

## 2020-06-18 DIAGNOSIS — D649 Anemia, unspecified: Secondary | ICD-10-CM | POA: Diagnosis not present

## 2020-06-18 LAB — COMPREHENSIVE METABOLIC PANEL
ALT: 13 U/L (ref 0–44)
AST: 35 U/L (ref 15–41)
Albumin: 2.9 g/dL — ABNORMAL LOW (ref 3.5–5.0)
Alkaline Phosphatase: 39 U/L (ref 38–126)
Anion gap: 22 — ABNORMAL HIGH (ref 5–15)
BUN: 92 mg/dL — ABNORMAL HIGH (ref 8–23)
CO2: 22 mmol/L (ref 22–32)
Calcium: 8.2 mg/dL — ABNORMAL LOW (ref 8.9–10.3)
Chloride: 96 mmol/L — ABNORMAL LOW (ref 98–111)
Creatinine, Ser: 13.44 mg/dL — ABNORMAL HIGH (ref 0.61–1.24)
GFR, Estimated: 4 mL/min — ABNORMAL LOW (ref >60)
Glucose, Bld: 142 mg/dL — ABNORMAL HIGH (ref 70–99)
Potassium: 4.5 mmol/L (ref 3.5–5.1)
Sodium: 140 mmol/L (ref 135–145)
Total Bilirubin: 0.9 mg/dL (ref 0.3–1.2)
Total Protein: 6.4 g/dL — ABNORMAL LOW (ref 6.5–8.1)

## 2020-06-18 LAB — TROPONIN I (HIGH SENSITIVITY)
Troponin I (High Sensitivity): 101 ng/L (ref <18)
Troponin I (High Sensitivity): 92 ng/L — ABNORMAL HIGH (ref <18)

## 2020-06-18 LAB — CBC
HCT: 22 % — ABNORMAL LOW (ref 39.0–52.0)
Hemoglobin: 7.3 g/dL — ABNORMAL LOW (ref 13.0–17.0)
MCH: 34.4 pg — ABNORMAL HIGH (ref 26.0–34.0)
MCHC: 33.2 g/dL (ref 30.0–36.0)
MCV: 103.8 fL — ABNORMAL HIGH (ref 80.0–100.0)
Platelets: 97 10*3/uL — ABNORMAL LOW (ref 150–400)
RBC: 2.12 MIL/uL — ABNORMAL LOW (ref 4.22–5.81)
RDW: 15 % (ref 11.5–15.5)
WBC: 7.7 10*3/uL (ref 4.0–10.5)
nRBC: 0 % (ref 0.0–0.2)

## 2020-06-18 LAB — POC SARS CORONAVIRUS 2 AG -  ED: SARS Coronavirus 2 Ag: POSITIVE — AB

## 2020-06-18 MED ORDER — ONDANSETRON HCL 4 MG/2ML IJ SOLN
4.0000 mg | Freq: Once | INTRAMUSCULAR | Status: AC
  Administered 2020-06-18: 4 mg via INTRAVENOUS
  Filled 2020-06-18: qty 2

## 2020-06-18 MED ORDER — LACTATED RINGERS IV BOLUS
500.0000 mL | Freq: Once | INTRAVENOUS | Status: AC
  Administered 2020-06-18: 500 mL via INTRAVENOUS

## 2020-06-18 MED ORDER — PANTOPRAZOLE SODIUM 40 MG IV SOLR
40.0000 mg | Freq: Once | INTRAVENOUS | Status: AC
  Administered 2020-06-18: 40 mg via INTRAVENOUS
  Filled 2020-06-18: qty 40

## 2020-06-18 MED ORDER — PANTOPRAZOLE SODIUM 40 MG IV SOLR: 40.0000 mg | Freq: Every day | INTRAVENOUS | Status: DC

## 2020-06-18 NOTE — ED Provider Notes (Signed)
[facility] Emergency Department Provider Note   ____________________________________________   Event Date/Time   First MD Initiated Contact with Patient 06/18/20 1831     (approximate)  I have reviewed the triage vital signs and the nursing notes.   HISTORY  Chief Complaint Emesis and Back Pain    HPI Kimahri Cheuvront is a 75 y.o. male with possible history of hypertension, hyperlipidemia, peripheral arterial disease on Eliquis, AAA status post repair, CHF, and ESRD on PD who presents to the ED for generalized weakness.  62 of history is obtained from patient's daughter, who states that the patient has been increasingly weak for the past 3 to 4 days.  He has not had any fevers but has complained of cough, denies chest pain or shortness of breath.  He has had some nausea with occasional vomiting as well as some diarrhea, but denies any blood in his stool.  He was recently transitioned from hemodialysis to peritoneal dialysis, which his daughter has been helping with at home and reports no difficulties.  He makes a minimal amount of urine typically.  He has not had any sick contacts and is fully vaccinated against COVID-19.        Past Medical History:  Diagnosis Date  . Hyperlipidemia   . Hypertension   . PVD (peripheral vascular disease) ([place])   . Renal disorder    ESRD  . Reynolds syndrome [facility])     Patient Active Problem List   Diagnosis Date Noted  . Anemia of chronic kidney failure, stage 5 ([place]) 05/06/2020  . Acute on chronic systolic CHF (congestive heart failure) ([place]) 05/06/2020  . Hypertensive emergency 05/06/2020  . Elevated troponin 05/06/2020  . Acute CHF (congestive heart failure) ([place]) 05/06/2020  . Acute respiratory failure with hypoxia ([place]) 05/06/2020  . Chronic anticoagulation 05/06/2020  . End stage renal disease on dialysis ([place]) 04/11/2020  . Altered mental status 04/10/2020  . HFrEF (heart failure with reduced  ejection fraction) ([place])   . Acute pulmonary edema ([place]) 11/18/2019  . Protein-calorie malnutrition, severe 09/27/2019  . Acute kidney failure, unspecified ([place])   . History of AAA (abdominal aortic aneurysm) repair   . Anemia   . Thrombocytopenia ([place])   . Acute kidney injury superimposed on CKD ([place])   . Essential hypertension   . Hyperlipidemia   . Fatigue 09/19/2019  . AAA (abdominal aortic aneurysm) ([place]) 08/14/2019  . AAA (abdominal aortic aneurysm) without rupture ([place]) 08/01/2019  . PVD (peripheral vascular disease) ([place]) 07/24/2019  . Tobacco abuse 07/24/2019  . Raynaud's disease without gangrene 06/21/2019  . Mixed dyslipidemia 06/15/2018  . Family history of heart attack 06/15/2018  . Smokers' cough ([place]) 06/16/2017    Past Surgical History:  Procedure Laterality Date  . DIALYSIS/PERMA CATHETER INSERTION N/A 09/26/2019   Procedure: DIALYSIS/PERMA CATHETER INSERTION;  Surgeon: Algernon Huxley, MD;  Location: [place] [facility];  Service: Cardiovascular;  Laterality: N/A;  . ENDOVASCULAR REPAIR/STENT GRAFT N/A 08/14/2019   Procedure: ENDOVASCULAR REPAIR/STENT GRAFT;  Surgeon: Katha Cabal, MD;  Location: [place] [facility];  Service: Cardiovascular;  Laterality: N/A;    Prior to Admission medications   Medication Sig Start Date End Date Taking? Authorizing Provider  acetaminophen (TYLENOL) 500 MG tablet Take 500-1,000 mg by mouth every 8 (eight) hours as needed for mild pain or moderate pain.     [provider]  apixaban (ELIQUIS) 2.5 MG TABS tablet Take 1 tablet (2.5 mg total) by mouth 2 (two) times  daily. 06/02/20   [name], MD  atorvastatin (LIPITOR) 40 MG tablet Take 1 tablet (40 mg total) by mouth daily. 11/24/19   Pokhrel, Corrie Mckusick, MD  calcium carbonate (TUMS - DOSED IN MG ELEMENTAL CALCIUM) 500 MG chewable tablet Chew 1 tablet by mouth 3 (three) times daily as needed for heartburn.    [provider]  carvedilol (COREG) 6.25 MG tablet  Take 1 tablet (6.25 mg total) by mouth 2 (two) times daily with a meal. 11/23/19   Pokhrel, [place], MD  gabapentin (NEURONTIN) 300 MG capsule Take 300 mg by mouth at bedtime.  11/09/19   [provider]  hydrALAZINE (APRESOLINE) 50 MG tablet Take 2 tablets (100 mg total) by mouth 3 (three) times daily. 06/02/20   [name], MD  irbesartan (AVAPRO) 150 MG tablet Take 150 mg by mouth every evening.  10/22/19   [provider]  multivitamin (RENA-VIT) TABS tablet Take 1 tablet by mouth at bedtime. 09/30/19   Fritzi Mandes, MD  torsemide (DEMADEX) 20 MG tablet Take 2 tablets (40 mg total) by mouth daily. 11/23/19   Pokhrel, Corrie Mckusick, MD    Allergies Ivp dye [iodinated diagnostic agents]  Family History  Problem Relation Age of Onset  . Heart disease Father   . Sudden Cardiac Death Father   . Heart disease Brother   . Hyperlipidemia Brother   . Heart disease Sister   . Heart disease Brother   . Hyperlipidemia Brother   . Hyperlipidemia Brother   . Hyperlipidemia Brother   . Hyperlipidemia Brother   . Hyperlipidemia Brother   . Heart disease Sister     Social History Social History   Tobacco Use  . Smoking status: Former Smoker    Packs/day: 0.33    Types: Cigarettes  . Smokeless tobacco: Never Used  . Tobacco comment: 6 cigarettes daily   Vaping Use  . Vaping Use: Never used  Substance Use Topics  . Alcohol use: No  . Drug use: No    Review of Systems  Constitutional: No fever/chills.  Positive for generalized weakness. Eyes: No visual changes. ENT: No sore throat. Cardiovascular: Denies chest pain. Respiratory: Denies shortness of breath.  Positive for cough. Gastrointestinal: No abdominal pain.  No nausea, no vomiting.  No diarrhea.  No constipation. Genitourinary: Negative for dysuria. Musculoskeletal: Negative for back pain. Skin: Negative for rash. Neurological: Negative for headaches, focal weakness or  numbness.  ____________________________________________   PHYSICAL EXAM:  VITAL SIGNS: ED Triage Vitals  Enc Vitals Group     BP 06/18/20 1541 128/65     Pulse Rate 06/18/20 1541 84     Resp 06/18/20 1541 18     Temp 06/18/20 1541 99 F (37.2 C)     Temp Source 06/18/20 1541 Oral     SpO2 06/18/20 1541 98 %     Weight 06/18/20 1550 114 lb 10.2 oz (52 kg)     Height 06/18/20 1550 '5\' 10"'$  (1.778 m)     Head Circumference --      Peak Flow --      Pain Score 06/18/20 1549 4     Pain Loc --      Pain Edu? --      Excl. in [place]? --     Constitutional: Alert and oriented. Eyes: Conjunctivae are normal. Head: Atraumatic. Nose: No congestion/rhinnorhea. Mouth/Throat: Mucous membranes are moist. Neck: Normal ROM Cardiovascular: Normal rate, regular rhythm. Grossly normal heart sounds. Respiratory: Normal respiratory effort.  No retractions.  Lungs CTAB. Gastrointestinal: Soft and nontender. No distention.  PD catheter in place with no erythema, warmth, or drainage. Genitourinary: deferred Musculoskeletal: No lower extremity tenderness nor edema. Neurologic:  Normal speech and language. No gross focal neurologic deficits are appreciated. Skin:  Skin is warm, dry and intact. No rash noted. Psychiatric: Mood and affect are normal. Speech and behavior are normal.  ____________________________________________   LABS (all labs ordered are listed, but only abnormal results are displayed)  Labs Reviewed  COMPREHENSIVE METABOLIC PANEL - Abnormal; Notable for the following components:      Result Value   Chloride 96 (*)    Glucose, Bld 142 (*)    BUN 92 (*)    Creatinine, Ser 13.44 (*)    Calcium 8.2 (*)    Total Protein 6.4 (*)    Albumin 2.9 (*)    GFR, Estimated 4 (*)    Anion gap 22 (*)    All other components within normal limits  CBC - Abnormal; Notable for the following components:   RBC 2.12 (*)    Hemoglobin 7.3 (*)    HCT 22.0 (*)    MCV 103.8 (*)    MCH 34.4 (*)     Platelets 97 (*)    All other components within normal limits  POC SARS CORONAVIRUS 2 AG -  ED - Abnormal; Notable for the following components:   SARS Coronavirus 2 Ag Positive (*)    All other components within normal limits  TROPONIN I (HIGH SENSITIVITY) - Abnormal; Notable for the following components:   Troponin I (High Sensitivity) 101 (*)    All other components within normal limits  TROPONIN I (HIGH SENSITIVITY) - Abnormal; Notable for the following components:   Troponin I (High Sensitivity) 92 (*)    All other components within normal limits   ____________________________________________  EKG  ED ECG REPORT I, Blake Divine, the attending physician, personally viewed and interpreted this ECG.   Date: 06/18/2020  EKG Time: 15:51  Rate: 82  Rhythm: normal sinus rhythm  Axis: LAD  Intervals:left anterior fascicular block  ST&T Change: Lateral T wave inversions, similar to previous   PROCEDURES  Procedure(s) performed (including Critical Care):  Procedures   ____________________________________________   INITIAL IMPRESSION / ASSESSMENT AND PLAN / ED COURSE       75 year old male with past medical history of hypertension, hyperlipidemia, PAD on Eliquis, AAA status post repair, CHF, and ESRD on PD who presents to the ED with worsening generalized weakness for the past few days.  He describes a cough along with some nausea and diarrhea, but has no abdominal pain or tenderness on exam.  I doubt peritonitis and low suspicion for UTI as he does not make much urine.  Chest x-ray reviewed by me and shows no infiltrate, edema, or effusion but did show free air.  This was further assessed with CT abdomen/pelvis which redemonstrates free air consistent with peritoneal dialysis.  Labs thus far are consistent with patient's ESRD given chronically elevated troponin, which we will trend.  He has tested positive for COVID-19, which is the likely etiology of his generalized  weakness.  We will hydrate with IV fluids and treat with Zofran, but patient denies significant respiratory symptoms and is maintaining O2 sats on room air.  Labs significant for hemoglobin of 7.3, which is a significant drop from 10.5 ten days ago.  Patient now reporting dark stools recently and on rectal exam he has dark guaiac positive stool.  We will treat  with IV Protonix and case discussed with hospitalist for admission.       ____________________________________________   FINAL CLINICAL IMPRESSION(S) / ED DIAGNOSES  Final diagnoses:  Gastrointestinal hemorrhage, unspecified gastrointestinal hemorrhage type  Generalized weakness  COVID-19     ED Discharge Orders    None       Note:  This document was prepared using Dragon voice recognition software and may include unintentional dictation errors.   Blake Divine, MD 06/18/20 2115

## 2020-06-18 NOTE — ED Provider Notes (Signed)
This provider was called by the on-call reading radiologist concern for patient's chest x-ray.  Radiologist what looks like free air under the diaphragm and recommends following up with a noncontrast CT of the abdomen and pelvis.  CT ordered   Naaman Plummer, MD 06/18/20 [id number]

## 2020-06-18 NOTE — ED Triage Notes (Signed)
Pt comes into the ED via POV c/o possible COVID.  PT complaints home dialysis currently, but he started having fever, nausea, vomiting, and back pain.  Pt has had no known exposed COVID contact, but has been in and out of the hospital and MD offices.  Pt currently has even and unlabored respirations and is in NAd.

## 2020-06-18 NOTE — ED Notes (Signed)
Called lab and they will send someone to come and draw labs.

## 2020-06-18 NOTE — ED Triage Notes (Signed)
Pt comes via POV with daughter from home with c/o possible COVID+. Family reports pt has had +/- covid test today. Fever and SOB 3 days ago.

## 2020-06-18 NOTE — H&P (Signed)
History and Physical    Scott Oconnor [id number] DOB: [birth year] DOA: 06/18/2020  PCP: [name], MD  Patient coming from: home, daughter at bedside   I have personally briefly reviewed patient's old medical records in [place]  Chief Complaint: weakness  HPI: Scott Oconnor is a 75 y.o. male with medical history significant for medical history significant for ESRD on PD, anemia of CKD, HTN, AAA repair with endovascular stent, chronic systolic heart failure, PVD, history of right IJ clot secondary to dialysis catheter on Eliquis who presents for concerns of persistent weakness.  Daughter at bedside provides most of history.  For the past week patient had fever, decreased appetite and progressive weakness requiring increased assistance from wife at home to help get around the house.  Prior to this he was completely independent.  Also had 3 days of dark stools and diarrhea but no bright red blood per rectum.  Denies abdominal pain.  Patient denies any headache, chest pain or shortness of breath.  He wears oxygen occasionally for "comfort" and has been wearing it throughout the day for the past few days but daughter says his oxygen saturation has remained at 97-100%.  He recently transitioned from hemodialysis to peritoneal dialysis for the past week and a half and has not missed any sessions at home. He is on Eliquis for DVT found to his right IJ from HD port.  There are plans by nephrology to remove it once his PD port was found to be reliable.  ED Course: In the ED, he was afebrile and hypertensive up to 160/69.  CBC notable for worsening anemia with hemoglobin of 7.3 from a prior of 10.5 just 10 days ago.  Platelets shows thrombocytopenia of 97. Creatinine mildly elevated at 13.44 from baseline of around 10.  No other significant electrolyte abnormalities warranting any urgent dialysis.  Covid antigen test positive.  X-ray did not show any infiltrate but initially had  concerns of pneumoperitoneum.  CT abdomen pelvis ordered for follow-up and showed pneumoperitoneum consistent with his peritoneal dialysis catheter.  Review of Systems:  Constitutional: No Weight Change, + Fever ENT/Mouth: No sore throat, No Rhinorrhea Eyes: No Eye Pain, No Vision Changes Cardiovascular: No Chest Pain, no SOB, No PND, No Dyspnea on Exertion, No Orthopnea, No Edema, No Palpitations Respiratory: + chronic Cough, No Sputum, No Wheezing, no Dyspnea  Gastrointestinal: No Nausea, No Vomiting, + Diarrhea, No Constipation, No Pain Genitourinary: no Urinary Incontinence, No Urgency, No Flank Pain Musculoskeletal: No Arthralgias, No Myalgias Skin: No Skin Lesions, No Pruritus, Neuro: + Weakness, No Numbness,  No Loss of Consciousness, No Syncope Psych: No Anxiety/Panic, No Depression, no decrease appetite Heme/Lymph: No Bruising, + Bleeding  Past Medical History:  Diagnosis Date  . Hyperlipidemia   . Hypertension   . PVD (peripheral vascular disease) ([place])   . Renal disorder    ESRD  . Reynolds syndrome [facility])     Past Surgical History:  Procedure Laterality Date  . DIALYSIS/PERMA CATHETER INSERTION N/A 09/26/2019   Procedure: DIALYSIS/PERMA CATHETER INSERTION;  Surgeon: Algernon Huxley, MD;  Location: [place] [facility];  Service: Cardiovascular;  Laterality: N/A;  . ENDOVASCULAR REPAIR/STENT GRAFT N/A 08/14/2019   Procedure: ENDOVASCULAR REPAIR/STENT GRAFT;  Surgeon: Katha Cabal, MD;  Location: [place] [facility];  Service: Cardiovascular;  Laterality: N/A;     reports that he has quit smoking. His smoking use included cigarettes. He smoked 0.33 packs per day. He has never used smokeless tobacco. He reports that  he does not drink alcohol and does not use drugs. Social History  Allergies  Allergen Reactions  . Ivp Dye [Iodinated Diagnostic Agents]     ESRD patient     Family History  Problem Relation Age of Onset  . Heart disease Father   . Sudden Cardiac  Death Father   . Heart disease Brother   . Hyperlipidemia Brother   . Heart disease Sister   . Heart disease Brother   . Hyperlipidemia Brother   . Hyperlipidemia Brother   . Hyperlipidemia Brother   . Hyperlipidemia Brother   . Hyperlipidemia Brother   . Heart disease Sister      Prior to Admission medications   Medication Sig Start Date End Date Taking? Authorizing Provider  acetaminophen (TYLENOL) 500 MG tablet Take 500-1,000 mg by mouth every 8 (eight) hours as needed for mild pain or moderate pain.     [provider]  apixaban (ELIQUIS) 2.5 MG TABS tablet Take 1 tablet (2.5 mg total) by mouth 2 (two) times daily. 06/02/20   [name], MD  atorvastatin (LIPITOR) 40 MG tablet Take 1 tablet (40 mg total) by mouth daily. 11/24/19   Pokhrel, Corrie Mckusick, MD  calcium carbonate (TUMS - DOSED IN MG ELEMENTAL CALCIUM) 500 MG chewable tablet Chew 1 tablet by mouth 3 (three) times daily as needed for heartburn.    [provider]  carvedilol (COREG) 6.25 MG tablet Take 1 tablet (6.25 mg total) by mouth 2 (two) times daily with a meal. 11/23/19   Pokhrel, Laxman, MD  gabapentin (NEURONTIN) 300 MG capsule Take 300 mg by mouth at bedtime.  11/09/19   [provider]  hydrALAZINE (APRESOLINE) 50 MG tablet Take 2 tablets (100 mg total) by mouth 3 (three) times daily. 06/02/20   [name], MD  irbesartan (AVAPRO) 150 MG tablet Take 150 mg by mouth every evening.  10/22/19   [provider]  multivitamin (RENA-VIT) TABS tablet Take 1 tablet by mouth at bedtime. 09/30/19   Fritzi Mandes, MD  torsemide (DEMADEX) 20 MG tablet Take 2 tablets (40 mg total) by mouth daily. 11/23/19   Flora Lipps, MD    Physical Exam: Vitals:   06/18/20 1738 06/18/20 1834 06/18/20 1900 06/18/20 2042  BP: (!) 151/61 (!) 148/71 (!) 145/64 (!) 162/69  Pulse: 74 78 77 77  Resp: '19 18 13 17  '$ Temp:      TempSrc:      SpO2: 100% 100% 100% 98%  Weight:      Height:         Constitutional: NAD, frail fatigued appearing elderly gentleman laying flat in bed wrapped in several layers of blankets Vitals:   06/18/20 1738 06/18/20 1834 06/18/20 1900 06/18/20 2042  BP: (!) 151/61 (!) 148/71 (!) 145/64 (!) 162/69  Pulse: 74 78 77 77  Resp: '19 18 13 17  '$ Temp:      TempSrc:      SpO2: 100% 100% 100% 98%  Weight:      Height:       Eyes: PERRL, lids and conjunctivae normal ENMT: Mucous membranes are moist.  Neck: normal, supple Respiratory: clear to auscultation bilaterally, no wheezing, no crackles. Normal respiratory effort. No accessory muscle use.  Cardiovascular: Regular rate and rhythm, no murmurs / rubs / gallops. No extremity edema.  Hemodialysis port on right anterior chest Abdomen: no tenderness, no masses palpated.  Bowel sounds positive.  Peritoneal dialysis catheter in place to lower abdomen. Musculoskeletal: no clubbing /  cyanosis. No joint deformity upper and lower extremities. Good ROM, no contractures. Normal muscle tone.  Skin: no rashes, lesions, ulcers. No induration Neurologic: CN 2-12 grossly intact. Sensation intact. Strength 5/5 of lower extremity.  Weak bilateral handgrip.   Psychiatric: Normal judgment and insight. Alert and oriented x 3.  Flat affect.     Labs on Admission: I have personally reviewed following labs and imaging studies  CBC: Recent Labs  Lab 06/18/20 1858  WBC 7.7  HGB 7.3*  HCT 22.0*  MCV 103.8*  PLT 97*   Basic Metabolic Panel: Recent Labs  Lab 06/18/20 1736  NA 140  K 4.5  CL 96*  CO2 22  GLUCOSE 142*  BUN 92*  CREATININE 13.44*  CALCIUM 8.2*   GFR: Estimated Creatinine Clearance: 3.6 mL/min (A) (by C-G formula based on SCr of 13.44 mg/dL (H)). Liver Function Tests: Recent Labs  Lab 06/18/20 1736  AST 35  ALT 13  ALKPHOS 39  BILITOT 0.9  PROT 6.4*  ALBUMIN 2.9*   No results for input(s): LIPASE, AMYLASE in the last 168 hours. No results for input(s): AMMONIA in the last 168  hours. Coagulation Profile: No results for input(s): INR, PROTIME in the last 168 hours. Cardiac Enzymes: No results for input(s): CKTOTAL, CKMB, CKMBINDEX, TROPONINI in the last 168 hours. BNP (last 3 results) No results for input(s): PROBNP in the last 8760 hours. HbA1C: No results for input(s): HGBA1C in the last 72 hours. CBG: No results for input(s): GLUCAP in the last 168 hours. Lipid Profile: No results for input(s): CHOL, HDL, LDLCALC, TRIG, CHOLHDL, LDLDIRECT in the last 72 hours. Thyroid Function Tests: No results for input(s): TSH, T4TOTAL, FREET4, T3FREE, THYROIDAB in the last 72 hours. Anemia Panel: No results for input(s): VITAMINB12, FOLATE, FERRITIN, TIBC, IRON, RETICCTPCT in the last 72 hours. Urine analysis:    Component Value Date/Time   COLORURINE YELLOW (A) 09/20/2019 1418   APPEARANCEUR CLEAR (A) 09/20/2019 1418   LABSPEC 1.009 09/20/2019 1418   PHURINE 6.0 09/20/2019 1418   GLUCOSEU NEGATIVE 09/20/2019 1418   HGBUR NEGATIVE 09/20/2019 1418   BILIRUBINUR NEGATIVE 09/20/2019 1418   BILIRUBINUR negative 09/19/2019 1029   KETONESUR NEGATIVE 09/20/2019 1418   PROTEINUR >=300 (A) 09/20/2019 1418   UROBILINOGEN 0.2 09/19/2019 1029   NITRITE NEGATIVE 09/20/2019 1418   LEUKOCYTESUR NEGATIVE 09/20/2019 1418    Radiological Exams on Admission: CT Abdomen Pelvis Wo Contrast  Result Date: 06/18/2020 CLINICAL DATA:  COVID-19 exposure, fever, nausea and vomiting, back pain EXAM: CT ABDOMEN AND PELVIS WITHOUT CONTRAST TECHNIQUE: Multidetector CT imaging of the abdomen and pelvis was performed following the standard protocol without IV contrast. COMPARISON:  08/28/2019 FINDINGS: Lower chest: There are small bilateral pleural effusions with minimal compressive atelectasis of the bilateral lower lobes. Fluid-filled distal thoracic esophagus of uncertain significance. Unenhanced CT was performed per clinician order. Lack of IV contrast limits sensitivity and specificity,  especially for evaluation of abdominal/pelvic solid viscera. Hepatobiliary: No focal liver abnormality is seen. No gallstones, gallbladder wall thickening, or biliary dilatation. Pancreas: Unremarkable. No pancreatic ductal dilatation or surrounding inflammatory changes. Spleen: Normal in size without focal abnormality. Adrenals/Urinary Tract: Significant bilateral renal atrophy consistent with end-stage renal disease and peritoneal dialysis dependence. Bladder is decompressed limiting its evaluation. Adrenals are unremarkable. Stomach/Bowel: No bowel obstruction or ileus. No bowel wall thickening or inflammatory change. Normal appendix right lower quadrant. Vascular/Lymphatic: Previous endoluminal stent graft repair of an abdominal aortic aneurysm. Diameter of the outer walls of the excluded aneurysm now  measures 3.5 cm, decreased since prior study where this measured 4.2 cm. Evaluation of the vascular and stent lumen is limited without IV contrast. There is extensive atherosclerosis.  No pathologic adenopathy. Reproductive: Prostate is unremarkable. Other: Indwelling peritoneal dialysis catheter is seen extending into the lower central pelvis. Trace fluid and moderate pneumoperitoneum consistent with indwelling dialysis catheter. No abdominal wall hernia. Musculoskeletal: No acute or destructive bony lesions. Reconstructed images demonstrate no additional findings. IMPRESSION: 1. Diffuse pneumoperitoneum and trace pelvic free fluid, consistent with indwelling peritoneal dialysis catheter. 2. Small bilateral pleural effusions with bilateral lower lobe atelectasis. 3. Otherwise no acute intra-abdominal or intrapelvic process on this exam limited by the lack of intravenous and oral contrast. 4. Decreased size of the abdominal aortic aneurysm after endoluminal stent graft repair. Evaluation limited without IV contrast. Electronically Signed   By: Randa Ngo M.D.   On: 06/18/2020 17:38   DG Chest 2 View  Result  Date: 06/18/2020 CLINICAL DATA:  75 year old male with COVID symptoms. Fever, nausea vomiting. Back pain. EXAM: CHEST - 2 VIEW COMPARISON:  Chest radiograph dated 05/06/2020. FINDINGS: Right sided dialysis catheter with tip at the cavoatrial junction. Small bilateral pleural effusions with bibasilar atelectasis. Pneumonia is not excluded. Overall significant interval decrease in the size of the pleural effusion and improvement of pulmonary aeration compared to prior radiograph. No pneumothorax. Mild cardiomegaly. Atherosclerotic calcification of the aorta. There is crescentic air under the right hemidiaphragm along the liver suspicious for pneumoperitoneum. Further evaluation with CT of the abdomen pelvis recommended. No acute osseous pathology. IMPRESSION: 1. Overall significant interval decrease in the size of the pleural effusion and improvement of pulmonary aeration compared to prior radiograph. 2. Findings suspicious for pneumoperitoneum. Further evaluation with CT of the abdomen pelvis recommended. These results were called by telephone at the time of interpretation on 06/18/2020 at 4:45 pm to provider [facility] , who verbally acknowledged these results. Electronically Signed   By: Anner Crete M.D.   On: 06/18/2020 16:49      Assessment/Plan  COVID infection Stable respiratory status on room air Symptoms of weakness and diarrhea Has received 500 cc fluid in the ED Consider starting IV remdesivir tomorrow once he is able to resume his peritoneal dialysis Airborne and contact isolation Follow CRP.  Low suspicion for PE since patient is on chronic Eliquis.  Acute blood loss anemia in the setting of Eliquis use -Admitted with hemoglobin of 7.3 from a prior of 10.5. -Patient admits to having melena for the past several days.  No records of previous colonoscopy or endoscopy. -Patient is on Eliquis for history of right IJ DVT due to hemodialysis port.  Appreciate nephrology consult in the  morning to discuss removal of port now that patient has good stable access with peritoneal dialysis catheter. -Transfusion threshold of less than 7 but will need to be cautious due to ESRD and history of chronic systolic CHF. Repeat CBC in the morning. -GI consult tomorrow for endoscopy -Keep n.p.o. -IV Protonix daily  ESRD on PD No urgent need for PD overnight. Need to consult nephrology in the morning for daily PD  HTN Continue Coreg, hydralazine and irbesartan  Chronic systolic heart failure Hold torsemide given his likely hypovolemic from acute anemia    Daughter specified no pain or sedation medications other than Tylenol  DVT prophylaxis:SCDs-active GI bleed Code Status: Full Family Communication: Plan discussed with patient and daughter at bedside  Daughter Kenney Houseman) would like to be updated daily and be notified of any  changes.  disposition Plan: Home with at least 2 midnight stays  Consults called:  Admission status: inpatient  Level of care: Progressive Cardiac  Status is: Inpatient  Remains inpatient appropriate because:Inpatient level of care appropriate due to severity of illness   Dispo: The patient is from: Home              Anticipated d/c is to: Home              Anticipated d/c date is: 3 days              Patient currently is not medically stable to d/c.   Difficult to place patient No         Orene Desanctis DO [facility]   If 7PM-7AM, please contact night-coverage [email or web address]   06/18/2020, 10:06 PM

## 2020-06-18 NOTE — ED Notes (Signed)
Patient provided with 3 warm blankets per request.

## 2020-06-18 NOTE — ED Notes (Signed)
Lab coming to redraw lost blood

## 2020-06-18 NOTE — ED Notes (Signed)
Lab called to verify initial blood work and that it is not showing "in process". Lab states they do not have blood down there.  Labs to be recollected.

## 2020-06-18 NOTE — ED Provider Notes (Signed)
MSE was initiated and I personally evaluated the patient and placed orders (if any) at  3:59 PM on June 18, 2020.  Home dialysis patient with weakness x 1-2 weeks, worsening over last few days. Got pale this AM. No exposure to covid. +Vaccinated.  Labs, imaging initiated.   The patient appears stable so that the remainder of the MSE may be completed by another provider.   Marlana Salvage, PA 06/18/20 1600    Blake Divine, MD 06/18/20 [phone]

## 2020-06-19 ENCOUNTER — Encounter
Admission: EM | Disposition: A | Discharge status: Home Health | Payer: Self-pay | Source: Home / Self Care | Attending: Internal Medicine

## 2020-06-19 DIAGNOSIS — U071 COVID-19: Secondary | ICD-10-CM

## 2020-06-19 DIAGNOSIS — K922 Gastrointestinal hemorrhage, unspecified: Secondary | ICD-10-CM | POA: Diagnosis not present

## 2020-06-19 DIAGNOSIS — R531 Weakness: Secondary | ICD-10-CM | POA: Diagnosis not present

## 2020-06-19 DIAGNOSIS — N186 End stage renal disease: Secondary | ICD-10-CM | POA: Diagnosis not present

## 2020-06-19 DIAGNOSIS — Z992 Dependence on renal dialysis: Secondary | ICD-10-CM | POA: Diagnosis not present

## 2020-06-19 HISTORY — PX: DIALYSIS/PERMA CATHETER REMOVAL: CATH118289

## 2020-06-19 LAB — CBC
HCT: 19.6 % — ABNORMAL LOW (ref 39.0–52.0)
HCT: 26.9 % — ABNORMAL LOW (ref 39.0–52.0)
Hemoglobin: 6.2 g/dL — ABNORMAL LOW (ref 13.0–17.0)
Hemoglobin: 8.7 g/dL — ABNORMAL LOW (ref 13.0–17.0)
MCH: 33 pg (ref 26.0–34.0)
MCH: 32.5 pg (ref 26.0–34.0)
MCHC: 31.6 g/dL (ref 30.0–36.0)
MCHC: 32.3 g/dL (ref 30.0–36.0)
MCV: 104.3 fL — ABNORMAL HIGH (ref 80.0–100.0)
MCV: 100.4 fL — ABNORMAL HIGH (ref 80.0–100.0)
Platelets: 89 10*3/uL — ABNORMAL LOW (ref 150–400)
Platelets: 98 10*3/uL — ABNORMAL LOW (ref 150–400)
RBC: 1.88 MIL/uL — ABNORMAL LOW (ref 4.22–5.81)
RBC: 2.68 MIL/uL — ABNORMAL LOW (ref 4.22–5.81)
RDW: 14.9 % (ref 11.5–15.5)
RDW: 15.8 % — ABNORMAL HIGH (ref 11.5–15.5)
WBC: 8.2 10*3/uL (ref 4.0–10.5)
WBC: 8.7 10*3/uL (ref 4.0–10.5)
nRBC: 0 % (ref 0.0–0.2)
nRBC: 0.2 % (ref 0.0–0.2)

## 2020-06-19 LAB — C-REACTIVE PROTEIN: CRP: 4.5 mg/dL — ABNORMAL HIGH (ref <1.0)

## 2020-06-19 SURGERY — DIALYSIS/PERMA CATHETER REMOVAL
Anesthesia: LOCAL

## 2020-06-19 MED ORDER — ATORVASTATIN CALCIUM 20 MG PO TABS
40.0000 mg | ORAL_TABLET | Freq: Every day | ORAL | Status: DC
  Filled 2020-06-19: qty 2

## 2020-06-19 MED ORDER — CALCIUM CARBONATE ANTACID 500 MG PO CHEW: 1.0000 | CHEWABLE_TABLET | Freq: Three times a day (TID) | ORAL | Status: DC | PRN

## 2020-06-19 MED ORDER — SODIUM CHLORIDE 0.9 % IV SOLN
80.0000 mg | Freq: Once | INTRAVENOUS | Status: AC
  Administered 2020-06-19: 14:00:00 80 mg via INTRAVENOUS
  Filled 2020-06-19: qty 80

## 2020-06-19 MED ORDER — GENTAMICIN SULFATE 0.1 % EX CREA
1.0000 "application " | TOPICAL_CREAM | Freq: Every day | CUTANEOUS | Status: DC
  Administered 2020-06-20 – 2020-06-26 (×4): 1 via TOPICAL
  Filled 2020-06-19 (×2): qty 15

## 2020-06-19 MED ORDER — IRBESARTAN 150 MG PO TABS: 150.0000 mg | ORAL_TABLET | Freq: Every evening | ORAL | Status: DC

## 2020-06-19 MED ORDER — LIDOCAINE-EPINEPHRINE (PF) 1 %-1:200000 IJ SOLN
INTRAMUSCULAR | Status: DC | PRN
  Administered 2020-06-19: 20 mL via INTRADERMAL

## 2020-06-19 MED ORDER — HYDRALAZINE HCL 50 MG PO TABS
100.0000 mg | ORAL_TABLET | Freq: Three times a day (TID) | ORAL | Status: DC
  Administered 2020-06-19: 100 mg via ORAL
  Filled 2020-06-19 (×2): qty 2

## 2020-06-19 MED ORDER — DELFLEX-LC/1.5% DEXTROSE 344 MOSM/L IP SOLN
INTRAPERITONEAL | Status: DC
  Filled 2020-06-19 (×6): qty 3000

## 2020-06-19 MED ORDER — SODIUM CHLORIDE 0.9% IV SOLUTION
Freq: Once | INTRAVENOUS | Status: DC
  Filled 2020-06-19: qty 250

## 2020-06-19 MED ORDER — SODIUM CHLORIDE 0.9 % IV SOLN
200.0000 mg | Freq: Once | INTRAVENOUS | Status: DC
  Filled 2020-06-19: qty 40

## 2020-06-19 MED ORDER — HYDRALAZINE HCL 50 MG PO TABS
100.0000 mg | ORAL_TABLET | Freq: Three times a day (TID) | ORAL | Status: DC
  Filled 2020-06-19: qty 2

## 2020-06-19 MED ORDER — CARVEDILOL 6.25 MG PO TABS
6.2500 mg | ORAL_TABLET | Freq: Two times a day (BID) | ORAL | Status: DC
  Administered 2020-06-19: 6.25 mg via ORAL
  Filled 2020-06-19 (×3): qty 1

## 2020-06-19 MED ORDER — GABAPENTIN 300 MG PO CAPS
300.0000 mg | ORAL_CAPSULE | Freq: Every day | ORAL | Status: DC
  Administered 2020-06-19 (×2): 300 mg via ORAL
  Filled 2020-06-19 (×2): qty 1

## 2020-06-19 MED ORDER — SODIUM CHLORIDE 0.9 % IV SOLN: 100.0000 mg | Freq: Every day | INTRAVENOUS | Status: DC

## 2020-06-19 MED ORDER — SODIUM CHLORIDE 0.9 % IV SOLN
8.0000 mg/h | INTRAVENOUS | Status: DC
  Administered 2020-06-19 – 2020-06-22 (×7): 8 mg/h via INTRAVENOUS
  Filled 2020-06-19 (×7): qty 80

## 2020-06-19 MED ORDER — PANTOPRAZOLE SODIUM 40 MG IV SOLR
40.0000 mg | Freq: Two times a day (BID) | INTRAVENOUS | Status: DC
  Administered 2020-06-22 – 2020-06-27 (×8): 40 mg via INTRAVENOUS
  Filled 2020-06-19 (×8): qty 40

## 2020-06-19 MED ORDER — ACETAMINOPHEN 500 MG PO TABS
500.0000 mg | ORAL_TABLET | Freq: Three times a day (TID) | ORAL | Status: DC | PRN
  Administered 2020-06-20 – 2020-06-21 (×3): 1000 mg via ORAL
  Filled 2020-06-19 (×3): qty 2

## 2020-06-19 SURGICAL SUPPLY — 4 items
APL PRP STRL LF DISP 70% ISPRP (MISCELLANEOUS) ×1
CHLORAPREP W/TINT 26 (MISCELLANEOUS) ×2 IMPLANT
FORCEPS HALSTEAD CVD 5IN STRL (INSTRUMENTS) ×2 IMPLANT
TRAY LACERAT/PLASTIC (MISCELLANEOUS) ×2 IMPLANT

## 2020-06-19 NOTE — Op Note (Signed)
Operative Note  Preoperative diagnosis:    1. ESRD with functional permanent access  Postoperative diagnosis:   1. ESRD with functional permanent access  Procedure:  Removal of RIGHT Permcath  Performing Clinician: Hezzie Bump PA-C  Surgeon:  Leotis Pain, MD  Anesthesia:  Local  EBL:  Minimal  Indication for the Procedure:  The patient has a functional permanent dialysis access and no longer needs their permcath.  This can be removed.  Risks and benefits are discussed and informed consent is obtained.  Description of the Procedure:  The patient's RIGHT neck, chest and existing catheter were sterilely prepped and draped. The area around the catheter was anesthetized copiously with 1% lidocaine. The catheter was dissected out with curved hemostats until the cuff was freed from the surrounding fibrous sheath. The fiber sheath was transected, and the catheter was then removed in its entirety using gentle traction. Pressure was held and sterile dressings were placed. The patient tolerated the procedure well and was taken to the recovery room in stable condition.  Joelene Millin A Kayly Kriegel  06/19/2020, 4:38 PM  This note was created with Dragon Medical transcription system. Any errors in dictation are purely unintentional.

## 2020-06-19 NOTE — Progress Notes (Addendum)
Ashe Kopper  [id number] DOB: [birth year] DOA: 06/18/2020 PCP: [name], MD    Brief Narrative:  75 year old with a history of ESRD recently transitioned to PD, anemia of CKD, HTN, AAA repair with endovascular stent, chronic systolic CHF, PVD, and 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 In the ED he was found to have acute worsening of his anemia with hemoglobin of 7.3 from baseline of 10.5. He was also found to be Covid positive though CXR noted no infiltrate.  Significant Events:  1/27 admit via ED  Date of Positive COVID Test:  1/27  Vaccination Status: Vaccinated x2 + boosted   COVID-19 specific Treatment: Remdesivir for 3 day course refused by family   Antimicrobials:  None  DVT prophylaxis: SCDs  Subjective: BP stable at the time of my visit. Is alert and conversant. Daughter does most of talking for him. He denies cp, n/v, or abdom pain. No signif SOB at present.   Assessment & Plan:  Acute blood loss anemia on anemia of CKD Baseline hemoglobin approximate 10.5 - GIB is etiology - transfuse if hemoglobin drops below 7.0  Recent Labs  Lab 06/18/20 1858  HGB 7.3*    UGIB - Melena  On eliquis - GI consulted for evaluation - changed to protonix gtt - transfuse as needed - may require urgent Endoscopy therefore I have called GI directly    CoViD+ - Incidental / Asymptomatic No signif pulmon sx at present - given his high risk status I advised 3 day infusion of Remdesivir as per the "PINETREE" trial protocol - the pt deferred to his daughter who does not wish to follow this recommendation at this time   ESRD on peritoneal dialysis Nephrology following  HTN Holding most home BP meds in setting of ongoing blood loss  Chronic systolic CHF No acute exacerbation at present - volume control per dialysis    Code Status: FULL CODE Family Communication: spoke at length w/ daughter at bedside  Status is: Inpatient  Remains inpatient appropriate because:Inpatient level of care appropriate due to severity of illness   Dispo: The patient is from: Home              Anticipated d/c is to: Home              Anticipated d/c date is: 3 days              Patient currently is not medically stable to d/c.   Difficult to place patient No  Consultants:  Nephrology GI   Objective: Blood pressure 135/62, pulse 61, temperature 99 F (37.2 C), temperature source Oral, resp. rate 20, height '5\' 10"'$  (1.778 m), weight 52 kg, SpO2 100 %.  Intake/Output Summary (Last 24 hours) at 06/19/2020 0941 Last data filed at 06/18/2020 2050 Gross per 24 hour  Intake 500 ml  Output -  Net 500 ml   Filed Weights   06/18/20 1550  Weight: 52 kg    Examination: General: No acute respiratory distress Lungs: Clear to auscultation bilaterally without wheezes or crackles Cardiovascular: Regular rate and rhythm without murmur gallop or rub normal S1 and S2 Abdomen: Nontender, nondistended, soft, bowel sounds positive, no rebound, no ascites, no appreciable mass - PD cath insertion unremarkable  Extremities: No significant cyanosis, clubbing, or edema bilateral lower extremities  CBC: Recent Labs  Lab 06/18/20 1858  WBC 7.7  HGB 7.3*  HCT 22.0*  MCV 103.8*  PLT 97*   Basic Metabolic Panel: Recent Labs  Lab 06/18/20 1736  NA 140  K 4.5  CL 96*  CO2 22  GLUCOSE 142*  BUN 92*  CREATININE 13.44*  CALCIUM 8.2*   GFR: Estimated Creatinine Clearance: 3.6 mL/min (A) (by C-G formula based on SCr of 13.44 mg/dL (H)).  Liver Function Tests: Recent Labs  Lab 06/18/20 1736  AST 35  ALT 13  ALKPHOS 39  BILITOT 0.9  PROT 6.4*  ALBUMIN 2.9*    HbA1C: Hemoglobin A1C  Date/Time Value Ref Range Status  09/19/2019 11:00 AM 6.2 (A) 4.0 - 5.6 % Final   Hgb A1c MFr Bld  Date/Time Value Ref Range Status   11/19/2019 05:24 PM 5.3 4.8 - 5.6 % Final    Comment:    (NOTE)         Prediabetes: 5.7 - 6.4         Diabetes: >6.4         Glycemic control for adults with diabetes: <7.0     Scheduled Meds: . atorvastatin  40 mg Oral Daily  . carvedilol  6.25 mg Oral BID WC  . gabapentin  300 mg Oral QHS  . hydrALAZINE  100 mg Oral TID  . irbesartan  150 mg Oral QPM  . pantoprazole (PROTONIX) IV  40 mg Intravenous QHS     LOS: 1 day   Cherene Altes, MD [facility] Office  [phone] Pager - Text Page per Amion  If 7PM-7AM, please contact night-coverage per Amion 06/19/2020, 9:41 AM

## 2020-06-19 NOTE — ED Notes (Signed)
MD made aware of drop in HGB

## 2020-06-19 NOTE — ED Notes (Signed)
Nephrology at bedside

## 2020-06-19 NOTE — Progress Notes (Signed)
[facility]  ROUNDING NOTE   Subjective:   [name] was admitted to [facility] on 06/18/2020 for Acute GI bleeding [K92.2] COVID-19+  Patient has been doing CAPD (manuals) at home. He was in the middle of peritoneal dialysis training when his daughter developed COVID-19 infection. Patient started feeling sick. Due to the COVID diagnosis, patient was asked to do manuals at home until he is off quarantine. However he started having dark tarry stools for a few days. He has been on apixaban for RIJ catheter thrombus.  Family denies use of NSAIDs.    Patient had a syncopal episode on 1/17 during hemodialysis. Family does not want patient to return to hemodialysis.   Objective:  Vital signs in last 24 hours:  Temp:  [99 F (37.2 C)] 99 F (37.2 C) (01/27 1541) Pulse Rate:  [59-84] 61 (01/28 0915) Resp:  [12-20] 20 (01/28 0915) BP: (115-162)/(51-111) 135/62 (01/28 0909) SpO2:  [97 %-100 %] 100 % (01/28 0915) Weight:  [52 kg] 52 kg (01/27 1550)  Weight change:  Filed Weights   06/18/20 1550  Weight: 52 kg    Intake/Output: I/O last 3 completed shifts: In: 500 [IV Piggyback:500] Out: -    Intake/Output this shift:  No intake/output data recorded.  Physical Exam: General: NAD, laying in bed, appears weak  Head: Normocephalic, atraumatic. Moist oral mucosal membranes  Eyes: Anicteric, PERRL  Neck: Supple, trachea midline  Lungs:  Clear to auscultation  Heart: Regular rate and rhythm  Abdomen:  Soft, nontender, +sutures  Extremities:  no peripheral edema.  Neurologic: Nonfocal, moving all four extremities  Skin: No lesions  Access: RIJ permcath, PD catheter    Basic Metabolic Panel: Recent Labs  Lab 06/18/20 1736  NA 140  K 4.5  CL 96*  CO2 22  GLUCOSE 142*  BUN 92*  CREATININE 13.44*  CALCIUM 8.2*    Liver Function Tests: Recent Labs  Lab 06/18/20 1736  AST 35  ALT 13  ALKPHOS 39  BILITOT 0.9  PROT 6.4*  ALBUMIN 2.9*   No results  for input(s): LIPASE, AMYLASE in the last 168 hours. No results for input(s): AMMONIA in the last 168 hours.  CBC: Recent Labs  Lab 06/18/20 1858  WBC 7.7  HGB 7.3*  HCT 22.0*  MCV 103.8*  PLT 97*    Cardiac Enzymes: No results for input(s): CKTOTAL, CKMB, CKMBINDEX, TROPONINI in the last 168 hours.  BNP: Invalid input(s): POCBNP  CBG: No results for input(s): GLUCAP in the last 168 hours.  Microbiology: Results for orders placed or performed during the hospital encounter of 05/06/20  Resp Panel by RT-PCR (Flu A&B, Covid) Nasopharyngeal Swab     Status: None   Collection Time: 05/06/20  2:14 AM   Specimen: Nasopharyngeal Swab; Nasopharyngeal(NP) swabs in vial transport medium  Result Value Ref Range Status   SARS Coronavirus 2 by RT PCR NEGATIVE NEGATIVE Final    Comment: (NOTE) SARS-CoV-2 target nucleic acids are NOT DETECTED.  The SARS-CoV-2 RNA is generally detectable in upper respiratory specimens during the acute phase of infection. The lowest concentration of SARS-CoV-2 viral copies this assay can detect is 138 copies/mL. A negative result does not preclude SARS-Cov-2 infection and should not be used as the sole basis for treatment or other patient management decisions. A negative result may occur with  improper specimen collection/handling, submission of specimen other than nasopharyngeal swab, presence of viral mutation(s) within the areas targeted by this assay, and inadequate number of viral copies(<138  copies/mL). A negative result must be combined with clinical observations, patient history, and epidemiological information. The expected result is Negative.  Fact Sheet for Patients:  EntrepreneurPulse.com.au  Fact Sheet for Healthcare Providers:  IncredibleEmployment.be  This test is no t yet approved or cleared by the [place] FDA and  has been authorized for detection and/or diagnosis of SARS-CoV-2 by FDA under  an Emergency Use Authorization (EUA). This EUA will remain  in effect (meaning this test can be used) for the duration of the COVID-19 declaration under Section 564(b)(1) of the Act, 21 U.S.C.section 360bbb-3(b)(1), unless the authorization is terminated  or revoked sooner.       Influenza A by PCR NEGATIVE NEGATIVE Final   Influenza B by PCR NEGATIVE NEGATIVE Final    Comment: (NOTE) The Xpert Xpress SARS-CoV-2/FLU/RSV plus assay is intended as an aid in the diagnosis of influenza from Nasopharyngeal swab specimens and should not be used as a sole basis for treatment. Nasal washings and aspirates are unacceptable for Xpert Xpress SARS-CoV-2/FLU/RSV testing.  Fact Sheet for Patients: EntrepreneurPulse.com.au  Fact Sheet for Healthcare Providers: IncredibleEmployment.be  This test is not yet approved or cleared by the [place] FDA and has been authorized for detection and/or diagnosis of SARS-CoV-2 by FDA under an Emergency Use Authorization (EUA). This EUA will remain in effect (meaning this test can be used) for the duration of the COVID-19 declaration under Section 564(b)(1) of the Act, 21 U.S.C. section 360bbb-3(b)(1), unless the authorization is terminated or revoked.  Performed at [facility], [address] [place], [place] [address]     Coagulation Studies: No results for input(s): LABPROT, INR in the last 72 hours.  Urinalysis: No results for input(s): COLORURINE, LABSPEC, PHURINE, GLUCOSEU, HGBUR, BILIRUBINUR, KETONESUR, PROTEINUR, UROBILINOGEN, NITRITE, LEUKOCYTESUR in the last 72 hours.  Invalid input(s): APPERANCEUR    Imaging: CT Abdomen Pelvis Wo Contrast  Result Date: 06/18/2020 CLINICAL DATA:  COVID-19 exposure, fever, nausea and vomiting, back pain EXAM: CT ABDOMEN AND PELVIS WITHOUT CONTRAST TECHNIQUE: Multidetector CT imaging of the abdomen and pelvis was performed following the standard protocol  without IV contrast. COMPARISON:  08/28/2019 FINDINGS: Lower chest: There are small bilateral pleural effusions with minimal compressive atelectasis of the bilateral lower lobes. Fluid-filled distal thoracic esophagus of uncertain significance. Unenhanced CT was performed per clinician order. Lack of IV contrast limits sensitivity and specificity, especially for evaluation of abdominal/pelvic solid viscera. Hepatobiliary: No focal liver abnormality is seen. No gallstones, gallbladder wall thickening, or biliary dilatation. Pancreas: Unremarkable. No pancreatic ductal dilatation or surrounding inflammatory changes. Spleen: Normal in size without focal abnormality. Adrenals/Urinary Tract: Significant bilateral renal atrophy consistent with end-stage renal disease and peritoneal dialysis dependence. Bladder is decompressed limiting its evaluation. Adrenals are unremarkable. Stomach/Bowel: No bowel obstruction or ileus. No bowel wall thickening or inflammatory change. Normal appendix right lower quadrant. Vascular/Lymphatic: Previous endoluminal stent graft repair of an abdominal aortic aneurysm. Diameter of the outer walls of the excluded aneurysm now measures 3.5 cm, decreased since prior study where this measured 4.2 cm. Evaluation of the vascular and stent lumen is limited without IV contrast. There is extensive atherosclerosis.  No pathologic adenopathy. Reproductive: Prostate is unremarkable. Other: Indwelling peritoneal dialysis catheter is seen extending into the lower central pelvis. Trace fluid and moderate pneumoperitoneum consistent with indwelling dialysis catheter. No abdominal wall hernia. Musculoskeletal: No acute or destructive bony lesions. Reconstructed images demonstrate no additional findings. IMPRESSION: 1. Diffuse pneumoperitoneum and trace pelvic free fluid, consistent with indwelling peritoneal dialysis catheter.  2. Small bilateral pleural effusions with bilateral lower lobe atelectasis. 3.  Otherwise no acute intra-abdominal or intrapelvic process on this exam limited by the lack of intravenous and oral contrast. 4. Decreased size of the abdominal aortic aneurysm after endoluminal stent graft repair. Evaluation limited without IV contrast. Electronically Signed   By: Randa Ngo M.D.   On: 06/18/2020 17:38   DG Chest 2 View  Result Date: 06/18/2020 CLINICAL DATA:  75 year old male with COVID symptoms. Fever, nausea vomiting. Back pain. EXAM: CHEST - 2 VIEW COMPARISON:  Chest radiograph dated 05/06/2020. FINDINGS: Right sided dialysis catheter with tip at the cavoatrial junction. Small bilateral pleural effusions with bibasilar atelectasis. Pneumonia is not excluded. Overall significant interval decrease in the size of the pleural effusion and improvement of pulmonary aeration compared to prior radiograph. No pneumothorax. Mild cardiomegaly. Atherosclerotic calcification of the aorta. There is crescentic air under the right hemidiaphragm along the liver suspicious for pneumoperitoneum. Further evaluation with CT of the abdomen pelvis recommended. No acute osseous pathology. IMPRESSION: 1. Overall significant interval decrease in the size of the pleural effusion and improvement of pulmonary aeration compared to prior radiograph. 2. Findings suspicious for pneumoperitoneum. Further evaluation with CT of the abdomen pelvis recommended. These results were called by telephone at the time of interpretation on 06/18/2020 at 4:45 pm to provider [facility] , who verbally acknowledged these results. Electronically Signed   By: Anner Crete M.D.   On: 06/18/2020 16:49     Medications:   . pantoprozole (PROTONIX) infusion    . pantoprazole (PROTONIX) 80 mg IVPB     . atorvastatin  40 mg Oral Daily  . carvedilol  6.25 mg Oral BID WC  . gabapentin  300 mg Oral QHS  . [START ON 06/22/2020] pantoprazole  40 mg Intravenous Q12H   acetaminophen, calcium carbonate  Assessment/ Plan:  [name] is a 75 y.o. [place]  male with end stage renal disease on peritoneal dialysis, hypertension, AAA repair, congestive heart failure, peripheral vascular disease, raynaud's syndrome, hyperlipidemia who is admitted to [facility] on 06/18/2020 for Acute GI bleeding [K92.2]  [facility] 52.5kg CCPD 9 hours 5 exchanges 2071m fills  1. End Stage Renal Disease: on peritoneal dialysis. Has no completed training and doing CAPD at home Currently. Will place on CCPD with cycler tonight. 9 hours five exchanges with 2000 fills. This will be his outpatient prescription.  - Consult vascular to remove permcath and then patient will be able to come off apixaban  2. Anemia with chronic kidney disease and acute blood loss anemia from GI bleed.  Hemoglobin 7.3, macrocytic. Baseline hemoglobin of 10.5 on 1/17.  - Appreciate GI input - Low threshold for transfusion - PPI  3. Hypertension: blood pressure at goal. Home regimen of irbesartan, torsemide, hydralazine, and carvedilol.  - Carvedilol restarted.  4. Secondary Hyperparathyroidism: with hyperphosphatemia. PTH 390 on 06/04/20 as outpatient.  - calcium carbonate with meals.    LOS: 1 Rasul Decola 1/28/202212:34 PM

## 2020-06-19 NOTE — Progress Notes (Signed)
Peritoneal dialysis patient known at [facility], found to be COVID +. Due to home treatments, there will not be any change to this plan, unless patient discharges to a rehab. Please contact me with any dialysis placement concerns.  Elvera Bicker Dialysis Coordinator [phone]

## 2020-06-19 NOTE — Consult Note (Signed)
[facility] Admission History & Physical  MRN : [id number]  Dearrius Hartford is a 75 y.o. ([birth year]) male who presents with chief complaint of  Chief Complaint  Patient presents with  . Emesis  . Back Pain   History of Present Illness:  I am asked to evaluate the patient by Dr. Juleen China.  Patient presents with GI bleed.  Patient with working peritoneal dialysis catheter and is no longer in need of his PermCath. The patient reports they're not been any problems with any of their dialysis runs. Patient denies pain or tenderness overlying the access.  There is no pain with dialysis. No fevers or chills while on dialysis.  Current Facility-Administered Medications  Medication Dose Route Frequency Provider Last Rate Last Admin  . 0.9 %  sodium chloride infusion (Manually program via Guardrails IV Fluids)   Intravenous Once Cherene Altes, MD      . acetaminophen (TYLENOL) tablet 500-1,000 mg  500-1,000 mg Oral Q8H PRN Tu, Ching T, DO      . calcium carbonate (TUMS - dosed in mg elemental calcium) chewable tablet 200 mg of elemental calcium  1 tablet Oral TID PRN Tu, Ching T, DO      . carvedilol (COREG) tablet 6.25 mg  6.25 mg Oral BID WC Tu, Ching T, DO      . gabapentin (NEURONTIN) capsule 300 mg  300 mg Oral QHS Tu, Ching T, DO   300 mg at 06/19/20 0422  . pantoprazole (PROTONIX) 80 mg in sodium chloride 0.9 % 100 mL (0.8 mg/mL) infusion  8 mg/hr Intravenous Continuous Cherene Altes, MD   Held at 06/19/20 1547  . [START ON 06/22/2020] pantoprazole (PROTONIX) injection 40 mg  40 mg Intravenous Q12H Cherene Altes, MD       Current Outpatient Medications  Medication Sig Dispense Refill  . acetaminophen (TYLENOL) 500 MG tablet Take 500-1,000 mg by mouth every 8 (eight) hours as needed for mild pain or moderate pain.     Marland Kitchen apixaban (ELIQUIS) 2.5 MG TABS tablet Take 1 tablet (2.5 mg total) by mouth 2 (two) times daily. 60 tablet 1  . atorvastatin (LIPITOR)  40 MG tablet Take 1 tablet (40 mg total) by mouth daily. 30 tablet 2  . azithromycin (ZITHROMAX) 250 MG tablet Take by mouth See admin instructions. TAKE 2 TABLETS BY MOUTH ON DAY 1 THEN 1 TABLET BY MOUTH ON DAY 2-5    . calcium carbonate (TUMS - DOSED IN MG ELEMENTAL CALCIUM) 500 MG chewable tablet Chew 1 tablet by mouth 3 (three) times daily as needed for heartburn.    . carvedilol (COREG) 6.25 MG tablet Take 1 tablet (6.25 mg total) by mouth 2 (two) times daily with a meal. 60 tablet 2  . gabapentin (NEURONTIN) 300 MG capsule Take 300 mg by mouth at bedtime.     . hydrALAZINE (APRESOLINE) 50 MG tablet Take 2 tablets (100 mg total) by mouth 3 (three) times daily. 540 tablet 3  . irbesartan (AVAPRO) 150 MG tablet Take 150 mg by mouth every evening.     . multivitamin (RENA-VIT) TABS tablet Take 1 tablet by mouth at bedtime. 30 tablet 2  . predniSONE (DELTASONE) 10 MG tablet Take 10 mg by mouth 2 (two) times daily.    Marland Kitchen torsemide (DEMADEX) 20 MG tablet Take 2 tablets (40 mg total) by mouth daily. (Patient taking differently: Take 40 mg by mouth 2 (two) times daily.) 30 tablet 1   Past Medical History:  Diagnosis Date  . Hyperlipidemia   . Hypertension   . PVD (peripheral vascular disease) ([place])   . Renal disorder    ESRD  . Reynolds syndrome [facility])    Past Surgical History:  Procedure Laterality Date  . DIALYSIS/PERMA CATHETER INSERTION N/A 09/26/2019   Procedure: DIALYSIS/PERMA CATHETER INSERTION;  Surgeon: Algernon Huxley, MD;  Location: [facility];  Service: Cardiovascular;  Laterality: N/A;  . ENDOVASCULAR REPAIR/STENT GRAFT N/A 08/14/2019   Procedure: ENDOVASCULAR REPAIR/STENT GRAFT;  Surgeon: Katha Cabal, MD;  Location: [facility];  Service: Cardiovascular;  Laterality: N/A;   Social History Social History   Tobacco Use  . Smoking status: Former Smoker    Packs/day: 0.33    Types: Cigarettes  . Smokeless tobacco: Never Used  . Tobacco comment: 6 cigarettes  daily   Vaping Use  . Vaping Use: Never used  Substance Use Topics  . Alcohol use: No  . Drug use: No   Family History Family History  Problem Relation Age of Onset  . Heart disease Father   . Sudden Cardiac Death Father   . Heart disease Brother   . Hyperlipidemia Brother   . Heart disease Sister   . Heart disease Brother   . Hyperlipidemia Brother   . Hyperlipidemia Brother   . Hyperlipidemia Brother   . Hyperlipidemia Brother   . Hyperlipidemia Brother   . Heart disease Sister   No family history of bleeding or clotting disorders, autoimmune disease or porphyria.  Allergies  Allergen Reactions  . Ivp Dye [Iodinated Diagnostic Agents]     ESRD patient    REVIEW OF SYSTEMS (Negative unless checked)  Constitutional: '[]'$ Weight loss  '[]'$ Fever  '[]'$ Chills Cardiac: '[]'$ Chest pain   '[]'$ Chest pressure   '[]'$ Palpitations   '[]'$ Shortness of breath when laying flat   '[]'$ Shortness of breath at rest   '[x]'$ Shortness of breath with exertion. Vascular:  '[]'$ Pain in legs with walking   '[]'$ Pain in legs at rest   '[]'$ Pain in legs when laying flat   '[]'$ Claudication   '[]'$ Pain in feet when walking  '[]'$ Pain in feet at rest  '[]'$ Pain in feet when laying flat   '[]'$ History of DVT   '[]'$ Phlebitis   '[]'$ Swelling in legs   '[]'$ Varicose veins   '[]'$ Non-healing ulcers Pulmonary:   '[]'$ Uses home oxygen   '[]'$ Productive cough   '[]'$ Hemoptysis   '[]'$ Wheeze  '[]'$ COPD   '[]'$ Asthma Neurologic:  '[]'$ Dizziness  '[]'$ Blackouts   '[]'$ Seizures   '[]'$ History of stroke   '[]'$ History of TIA  '[]'$ Aphasia   '[]'$ Temporary blindness   '[]'$ Dysphagia   '[]'$ Weakness or numbness in arms   '[]'$ Weakness or numbness in legs Musculoskeletal:  '[]'$ Arthritis   '[]'$ Joint swelling   '[]'$ Joint pain   '[]'$ Low back pain Hematologic:  '[]'$ Easy bruising  '[]'$ Easy bleeding   '[]'$ Hypercoagulable state   '[]'$ Anemic  '[]'$ Hepatitis Gastrointestinal:  '[]'$ Blood in stool   '[]'$ Vomiting blood  '[]'$ Gastroesophageal reflux/heartburn   '[]'$ Difficulty swallowing. Genitourinary:  '[x]'$ Chronic kidney disease   '[]'$ Difficult urination  '[]'$ Frequent  urination  '[]'$ Burning with urination   '[]'$ Blood in urine Skin:  '[]'$ Rashes   '[]'$ Ulcers   '[]'$ Wounds Psychological:  '[]'$ History of anxiety   '[]'$  History of major depression.  Physical Examination  Vitals:   06/19/20 0909 06/19/20 0915 06/19/20 1551 06/19/20 1619  BP: 135/62  (!) 163/71 (!) 160/72  Pulse: (!) 59 61 70 70  Resp: '18 20 17 13  '$ Temp:   98.7 F (37.1 C) 98.2 F (36.8 C)  TempSrc:   Oral Oral  SpO2: 100% 100% 100% 100%  Weight:      Height:       Body mass index is 16.45 kg/m. Gen: WD/WN, NAD Head: [place]/AT, No temporalis wasting. Prominent temp pulse not noted. Ear/Nose/Throat: Hearing grossly intact, nares w/o erythema or drainage, oropharynx w/o Erythema/Exudate,  Eyes: Conjunctiva clear, sclera non-icteric Neck: Trachea midline.  No JVD.  Pulmonary:  Good air movement, respirations not labored, no use of accessory muscles.  Cardiac: RRR, normal S1, S2. Vascular:  Vessel Right Left  Radial Palpable Palpable  Ulnar Not Palpable Not Palpable  Brachial Palpable Palpable  Carotid Palpable, without bruit Palpable, without bruit   Right IJ PermCath: Intact.  No signs of infection.  Gastrointestinal: soft, non-tender/non-distended. No guarding/reflex.  Musculoskeletal: M/S 5/5 throughout.  Extremities without ischemic changes.  No deformity or atrophy.  Neurologic: Sensation grossly intact in extremities.  Symmetrical.  Speech is fluent. Motor exam as listed above. Psychiatric: Judgment intact, Mood & affect appropriate for pt's clinical situation. Dermatologic: No rashes or ulcers noted.  No cellulitis or open wounds. Lymph : No Cervical, Axillary, or Inguinal lymphadenopathy.  CBC Lab Results  Component Value Date   WBC 8.2 06/19/2020   HGB 6.2 (L) 06/19/2020   HCT 19.6 (L) 06/19/2020   MCV 104.3 (H) 06/19/2020   PLT 89 (L) 06/19/2020   BMET    Component Value Date/Time   NA 140 06/18/2020 1736   K 4.5 06/18/2020 1736   CL 96 (L) 06/18/2020 1736   CO2 22  06/18/2020 1736   GLUCOSE 142 (H) 06/18/2020 1736   BUN 92 (H) 06/18/2020 1736   CREATININE 13.44 (H) 06/18/2020 1736   CREATININE 4.69 (H) 09/19/2019 1130   CALCIUM 8.2 (L) 06/18/2020 1736   GFRNONAA 4 (L) 06/18/2020 1736   GFRNONAA 11 (L) 09/19/2019 1130   GFRAA 8 (L) 11/23/2019 0648   GFRAA 7 (L) 11/23/2019 0648   GFRAA 13 (L) 09/19/2019 1130   Estimated Creatinine Clearance: 3.6 mL/min (A) (by C-G formula based on SCr of 13.44 mg/dL (H)).  COAG Lab Results  Component Value Date   INR 1.4 (H) 05/06/2020   INR 1.1 11/22/2019   INR 1.1 09/24/2019   Radiology CT Abdomen Pelvis Wo Contrast  Result Date: 06/18/2020 CLINICAL DATA:  COVID-19 exposure, fever, nausea and vomiting, back pain EXAM: CT ABDOMEN AND PELVIS WITHOUT CONTRAST TECHNIQUE: Multidetector CT imaging of the abdomen and pelvis was performed following the standard protocol without IV contrast. COMPARISON:  08/28/2019 FINDINGS: Lower chest: There are small bilateral pleural effusions with minimal compressive atelectasis of the bilateral lower lobes. Fluid-filled distal thoracic esophagus of uncertain significance. Unenhanced CT was performed per clinician order. Lack of IV contrast limits sensitivity and specificity, especially for evaluation of abdominal/pelvic solid viscera. Hepatobiliary: No focal liver abnormality is seen. No gallstones, gallbladder wall thickening, or biliary dilatation. Pancreas: Unremarkable. No pancreatic ductal dilatation or surrounding inflammatory changes. Spleen: Normal in size without focal abnormality. Adrenals/Urinary Tract: Significant bilateral renal atrophy consistent with end-stage renal disease and peritoneal dialysis dependence. Bladder is decompressed limiting its evaluation. Adrenals are unremarkable. Stomach/Bowel: No bowel obstruction or ileus. No bowel wall thickening or inflammatory change. Normal appendix right lower quadrant. Vascular/Lymphatic: Previous endoluminal stent graft repair  of an abdominal aortic aneurysm. Diameter of the outer walls of the excluded aneurysm now measures 3.5 cm, decreased since prior study where this measured 4.2 cm. Evaluation of the vascular and stent lumen is limited without IV contrast. There is extensive atherosclerosis.  No pathologic adenopathy.  Reproductive: Prostate is unremarkable. Other: Indwelling peritoneal dialysis catheter is seen extending into the lower central pelvis. Trace fluid and moderate pneumoperitoneum consistent with indwelling dialysis catheter. No abdominal wall hernia. Musculoskeletal: No acute or destructive bony lesions. Reconstructed images demonstrate no additional findings. IMPRESSION: 1. Diffuse pneumoperitoneum and trace pelvic free fluid, consistent with indwelling peritoneal dialysis catheter. 2. Small bilateral pleural effusions with bilateral lower lobe atelectasis. 3. Otherwise no acute intra-abdominal or intrapelvic process on this exam limited by the lack of intravenous and oral contrast. 4. Decreased size of the abdominal aortic aneurysm after endoluminal stent graft repair. Evaluation limited without IV contrast. Electronically Signed   By: Randa Ngo M.D.   On: 06/18/2020 17:38   DG Chest 2 View  Result Date: 06/18/2020 CLINICAL DATA:  75 year old male with COVID symptoms. Fever, nausea vomiting. Back pain. EXAM: CHEST - 2 VIEW COMPARISON:  Chest radiograph dated 05/06/2020. FINDINGS: Right sided dialysis catheter with tip at the cavoatrial junction. Small bilateral pleural effusions with bibasilar atelectasis. Pneumonia is not excluded. Overall significant interval decrease in the size of the pleural effusion and improvement of pulmonary aeration compared to prior radiograph. No pneumothorax. Mild cardiomegaly. Atherosclerotic calcification of the aorta. There is crescentic air under the right hemidiaphragm along the liver suspicious for pneumoperitoneum. Further evaluation with CT of the abdomen pelvis  recommended. No acute osseous pathology. IMPRESSION: 1. Overall significant interval decrease in the size of the pleural effusion and improvement of pulmonary aeration compared to prior radiograph. 2. Findings suspicious for pneumoperitoneum. Further evaluation with CT of the abdomen pelvis recommended. These results were called by telephone at the time of interpretation on 06/18/2020 at 4:45 pm to provider [facility] , who verbally acknowledged these results. Electronically Signed   By: Anner Crete M.D.   On: 06/18/2020 16:49   CT Head Wo Contrast  Result Date: 06/08/2020 CLINICAL DATA:  Altered mental status. EXAM: CT HEAD WITHOUT CONTRAST TECHNIQUE: Contiguous axial images were obtained from the base of the skull through the vertex without intravenous contrast. COMPARISON:  April 10, 2020 FINDINGS: Brain: There is mild cerebral atrophy with widening of the extra-axial spaces and ventricular dilatation. There are areas of decreased attenuation within the white matter tracts of the supratentorial brain, consistent with microvascular disease changes. 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 A small area of cortical encephalomalacia, with adjacent chronic white matter low attenuation is seen within the cerebellum on the right. Vascular: No hyperdense vessel or unexpected calcification. Skull: Normal. Negative for fracture or focal lesion. Sinuses/Orbits: There is mild to moderate severity right ethmoid sinus mucosal thickening. Other: None. IMPRESSION: 1. Generalized cerebral atrophy. 2. No acute intracranial abnormality. Electronically Signed   By: Virgina Norfolk M.D.   On: 06/08/2020 16:40   PERIPHERAL VASCULAR CATHETERIZATION  Result Date: 06/19/2020 See op note  Assessment/Plan 1) End-stage renal disease requiring hemodialysis: The patient has a peritoneal dialysis catheter which is functioning well.  Therefore, the patient will undergo removal of the tunneled catheter  under local anesthesia.  The risks and benefits were described to the patient.  All questions were answered.  The patient agrees to proceed with angiography and intervention. Potassium will be drawn to ensure that it is an appropriate level prior to performing intervention. Patient will continue dialysis therapy without further interruption if a successful intervention is not achieved then a tunneled catheter will be placed. Dialysis has already been arranged. 2.  Hypertension:  Patient will continue medical management; nephrology is following no changes  in oral medications. 3.  Hyperlipidemia:   On statin for medical management Encouraged good control as its slows the progression of atherosclerotic disease  Discussed with Dr. Mayme Genta, PA-C  06/19/2020 4:27 PM

## 2020-06-19 NOTE — Progress Notes (Signed)
Pt daughter at bedside from ED She was able to stay all night in the ED with patient due to language barrier and anxiety. Spoke with Franki Cabot AD and authorization given that patient daughter Scott Oconnor is able to stay overnight at bedside with patient. Charge RN aware

## 2020-06-19 NOTE — Consult Note (Signed)
Consultation  Referring Provider:    Dr Flossie Buffy  Admit date 06/18/20 Consult date        06/19/20 Reason for Consultation:    melena          HPI:   Scott Oconnor is a 75 y.o. male  history significant for ESRD on PD, anemia of CKD, HTN, AAA repair with endovascular stent, chronic systolic heart failure, PVD, history of right IJ clot secondary to dialysis catheter on Eliquis- admitted with weakness, low hgb- admission hgb 7.3 (baseline around 10), and melena. He has some thrombocytopenia. troponins have uptrended and he has tested + for coronavirus He has been started on IV pantoprazole.  CT A/P yesterday- noted a fluid filled esophagus. Also IMPRESSION: 1. Diffuse pneumoperitoneum and trace pelvic free fluid, consistent with indwelling peritoneal dialysis catheter. 2. Small bilateral pleural effusions with bilateral lower lobe atelectasis. 3. Otherwise no acute intra-abdominal or intrapelvic process on this exam limited by the lack of intravenous and oral contrast. 4. Decreased size of the abdominal aortic aneurysm after endoluminal stent graft repair. Evaluation limited without IV contrast.  Patient's daughter is with him and helps with history as he is somewhat drowsy. states patient has required dialysis after aaa repiar las march - was on HD but converted to PD very recently. They report that he became rather weak last week with intermittent fever- felt to have coronavirus- he became nauseous, had increase in stool frequency and vomited a couple times over the last 2d. Neither appeared as coffeegrounds. He reports his stools did turn black- states he has not had a bowel movement today.  They deny any abdominal pain, dysphagia, significant weight changes They deny any problems with his port site Maternal uncle with colon cancer in his 60s no other known family history of gi issues No nsaids. Last eliquis yesterday about 11-took 2.5 mg  They report he took a vitamin/herbal supplement  containing ginger, cayenne, and turmeric to help with covid symptoms in the last couple of days. He is + for coronavirus here. Denies any breathing problems Hemoglobin this afternoon was 6.8, transfusion has been planned and is being prepared.   PREVIOUS ENDOSCOPIES:            No known egd/colonoscopy.   Past Medical History:  Diagnosis Date  . Hyperlipidemia   . Hypertension   . PVD (peripheral vascular disease) ([place])   . Renal disorder    ESRD  . Reynolds syndrome [facility])     Past Surgical History:  Procedure Laterality Date  . DIALYSIS/PERMA CATHETER INSERTION N/A 09/26/2019   Procedure: DIALYSIS/PERMA CATHETER INSERTION;  Surgeon: Algernon Huxley, MD;  Location: [place] [facility];  Service: Cardiovascular;  Laterality: N/A;  . ENDOVASCULAR REPAIR/STENT GRAFT N/A 08/14/2019   Procedure: ENDOVASCULAR REPAIR/STENT GRAFT;  Surgeon: Katha Cabal, MD;  Location: [place] [facility];  Service: Cardiovascular;  Laterality: N/A;    Family History  Problem Relation Age of Onset  . Heart disease Father   . Sudden Cardiac Death Father   . Heart disease Brother   . Hyperlipidemia Brother   . Heart disease Sister   . Heart disease Brother   . Hyperlipidemia Brother   . Hyperlipidemia Brother   . Hyperlipidemia Brother   . Hyperlipidemia Brother   . Hyperlipidemia Brother   . Heart disease Sister      Social History   Tobacco Use  . Smoking status: Former Smoker    Packs/day: 0.33    Types: Cigarettes  .  Smokeless tobacco: Never Used  . Tobacco comment: 6 cigarettes daily   Vaping Use  . Vaping Use: Never used  Substance Use Topics  . Alcohol use: No  . Drug use: No    Prior to Admission medications   Medication Sig Start Date End Date Taking? Authorizing Provider  acetaminophen (TYLENOL) 500 MG tablet Take 500-1,000 mg by mouth every 8 (eight) hours as needed for mild pain or moderate pain.    Yes [provider]  apixaban (ELIQUIS) 2.5 MG TABS tablet Take  1 tablet (2.5 mg total) by mouth 2 (two) times daily. 06/02/20  Yes [name], MD  atorvastatin (LIPITOR) 40 MG tablet Take 1 tablet (40 mg total) by mouth daily. 11/24/19  Yes Pokhrel, Laxman, MD  azithromycin (ZITHROMAX) 250 MG tablet Take by mouth See admin instructions. TAKE 2 TABLETS BY MOUTH ON DAY 1 THEN 1 TABLET BY MOUTH ON DAY 2-5 06/12/20  Yes [provider]  calcium carbonate (TUMS - DOSED IN MG ELEMENTAL CALCIUM) 500 MG chewable tablet Chew 1 tablet by mouth 3 (three) times daily as needed for heartburn.   Yes [provider]  carvedilol (COREG) 6.25 MG tablet Take 1 tablet (6.25 mg total) by mouth 2 (two) times daily with a meal. 11/23/19  Yes Pokhrel, Laxman, MD  gabapentin (NEURONTIN) 300 MG capsule Take 300 mg by mouth at bedtime.  11/09/19  Yes [provider]  hydrALAZINE (APRESOLINE) 50 MG tablet Take 2 tablets (100 mg total) by mouth 3 (three) times daily. 06/02/20  Yes Gollan, Kathlene November, MD  irbesartan (AVAPRO) 150 MG tablet Take 150 mg by mouth every evening.  10/22/19  Yes [provider]  multivitamin (RENA-VIT) TABS tablet Take 1 tablet by mouth at bedtime. 09/30/19  Yes Fritzi Mandes, MD  predniSONE (DELTASONE) 10 MG tablet Take 10 mg by mouth 2 (two) times daily. 06/12/20  Yes [provider]  torsemide (DEMADEX) 20 MG tablet Take 2 tablets (40 mg total) by mouth daily. Patient taking differently: Take 40 mg by mouth 2 (two) times daily. 11/23/19  Yes Pokhrel, Corrie Mckusick, MD    Current Facility-Administered Medications  Medication Dose Route Frequency Provider Last Rate Last Admin  . 0.9 %  sodium chloride infusion (Manually program via Guardrails IV Fluids)   Intravenous Once Cherene Altes, MD      . acetaminophen (TYLENOL) tablet 500-1,000 mg  500-1,000 mg Oral Q8H PRN Tu, Ching T, DO      . atorvastatin (LIPITOR) tablet 40 mg  40 mg Oral Daily Tu, Ching T, DO      . calcium carbonate (TUMS - dosed in mg elemental calcium)  chewable tablet 200 mg of elemental calcium  1 tablet Oral TID PRN Tu, Ching T, DO      . carvedilol (COREG) tablet 6.25 mg  6.25 mg Oral BID WC Tu, Ching T, DO      . gabapentin (NEURONTIN) capsule 300 mg  300 mg Oral QHS Tu, Ching T, DO   300 mg at 06/19/20 0422  . pantoprazole (PROTONIX) 80 mg in sodium chloride 0.9 % 100 mL (0.8 mg/mL) infusion  8 mg/hr Intravenous Continuous Cherene Altes, MD 10 mL/hr at 06/19/20 1415 8 mg/hr at 06/19/20 1415  . [START ON 06/22/2020] pantoprazole (PROTONIX) injection 40 mg  40 mg Intravenous Q12H Cherene Altes, MD       Current Outpatient Medications  Medication Sig Dispense Refill  . acetaminophen (TYLENOL) 500 MG tablet Take 500-1,000 mg  by mouth every 8 (eight) hours as needed for mild pain or moderate pain.     Marland Kitchen apixaban (ELIQUIS) 2.5 MG TABS tablet Take 1 tablet (2.5 mg total) by mouth 2 (two) times daily. 60 tablet 1  . atorvastatin (LIPITOR) 40 MG tablet Take 1 tablet (40 mg total) by mouth daily. 30 tablet 2  . azithromycin (ZITHROMAX) 250 MG tablet Take by mouth See admin instructions. TAKE 2 TABLETS BY MOUTH ON DAY 1 THEN 1 TABLET BY MOUTH ON DAY 2-5    . calcium carbonate (TUMS - DOSED IN MG ELEMENTAL CALCIUM) 500 MG chewable tablet Chew 1 tablet by mouth 3 (three) times daily as needed for heartburn.    . carvedilol (COREG) 6.25 MG tablet Take 1 tablet (6.25 mg total) by mouth 2 (two) times daily with a meal. 60 tablet 2  . gabapentin (NEURONTIN) 300 MG capsule Take 300 mg by mouth at bedtime.     . hydrALAZINE (APRESOLINE) 50 MG tablet Take 2 tablets (100 mg total) by mouth 3 (three) times daily. 540 tablet 3  . irbesartan (AVAPRO) 150 MG tablet Take 150 mg by mouth every evening.     . multivitamin (RENA-VIT) TABS tablet Take 1 tablet by mouth at bedtime. 30 tablet 2  . predniSONE (DELTASONE) 10 MG tablet Take 10 mg by mouth 2 (two) times daily.    Marland Kitchen torsemide (DEMADEX) 20 MG tablet Take 2 tablets (40 mg total) by mouth daily. (Patient  taking differently: Take 40 mg by mouth 2 (two) times daily.) 30 tablet 1    Allergies as of 06/18/2020 - Review Complete 06/18/2020  Allergen Reaction Noted  . Ivp dye [iodinated diagnostic agents]  11/19/2019     Review of Systems:    All systems reviewed and negative except where noted in HPI. Marland Kitchen    Physical Exam:  Vital signs in last 24 hours: Temp:  [99 F (37.2 C)] 99 F (37.2 C) (01/27 1541) Pulse Rate:  [59-84] 61 (01/28 0915) Resp:  [12-20] 20 (01/28 0915) BP: (115-162)/(51-111) 135/62 (01/28 0909) SpO2:  [97 %-100 %] 100 % (01/28 0915) Weight:  [52 kg] 52 kg (01/27 1550)   General:   Pleasant man in NAD Head:  Normocephalic and atraumatic. Eyes:   No icterus.   Conjunctiva pink. Ears:  Normal auditory acuity. Mouth: Mucosa pink moist, no lesions. Neck:  Supple; no masses felt Lungs:  Respirations even and unlabored. Lungs clear to auscultation bilaterally.   No wheezes, crackles, or rhonchi.  Heart:  S1S2, RRR, no MRG. No edema. Abdomen:  CAPD catheter present, dsg cdi. Flat, soft, nondistended, nontender. Normal bowel sounds. No appreciable masses or hepatomegaly. No rebound signs or other peritoneal signs. Rectal:  Not performed.  Msk:  MAEW x4, No clubbing or cyanosis. Strength 5/5. Symmetrical without gross deformities. Neurologic:  Alert and  oriented x4;  Cranial nerves II-XII intact.  Skin:  Warm, dry, pink without significant lesions or rashes. Psych:  Alert and cooperative. Normal affect.  LAB RESULTS: Recent Labs    06/18/20 1858 06/19/20 1258  WBC 7.7 8.2  HGB 7.3* 6.2*  HCT 22.0* 19.6*  PLT 97* 89*   BMET Recent Labs    06/18/20 1736  NA 140  K 4.5  CL 96*  CO2 22  GLUCOSE 142*  BUN 92*  CREATININE 13.44*  CALCIUM 8.2*   LFT Recent Labs    06/18/20 1736  PROT 6.4*  ALBUMIN 2.9*  AST 35  ALT 13  ALKPHOS 39  BILITOT 0.9   PT/INR No results for input(s): LABPROT, INR in the last 72 hours.  STUDIES: CT Abdomen Pelvis Wo  Contrast  Result Date: 06/18/2020 CLINICAL DATA:  COVID-19 exposure, fever, nausea and vomiting, back pain EXAM: CT ABDOMEN AND PELVIS WITHOUT CONTRAST TECHNIQUE: Multidetector CT imaging of the abdomen and pelvis was performed following the standard protocol without IV contrast. COMPARISON:  08/28/2019 FINDINGS: Lower chest: There are small bilateral pleural effusions with minimal compressive atelectasis of the bilateral lower lobes. Fluid-filled distal thoracic esophagus of uncertain significance. Unenhanced CT was performed per clinician order. Lack of IV contrast limits sensitivity and specificity, especially for evaluation of abdominal/pelvic solid viscera. Hepatobiliary: No focal liver abnormality is seen. No gallstones, gallbladder wall thickening, or biliary dilatation. Pancreas: Unremarkable. No pancreatic ductal dilatation or surrounding inflammatory changes. Spleen: Normal in size without focal abnormality. Adrenals/Urinary Tract: Significant bilateral renal atrophy consistent with end-stage renal disease and peritoneal dialysis dependence. Bladder is decompressed limiting its evaluation. Adrenals are unremarkable. Stomach/Bowel: No bowel obstruction or ileus. No bowel wall thickening or inflammatory change. Normal appendix right lower quadrant. Vascular/Lymphatic: Previous endoluminal stent graft repair of an abdominal aortic aneurysm. Diameter of the outer walls of the excluded aneurysm now measures 3.5 cm, decreased since prior study where this measured 4.2 cm. Evaluation of the vascular and stent lumen is limited without IV contrast. There is extensive atherosclerosis.  No pathologic adenopathy. Reproductive: Prostate is unremarkable. Other: Indwelling peritoneal dialysis catheter is seen extending into the lower central pelvis. Trace fluid and moderate pneumoperitoneum consistent with indwelling dialysis catheter. No abdominal wall hernia. Musculoskeletal: No acute or destructive bony lesions.  Reconstructed images demonstrate no additional findings. IMPRESSION: 1. Diffuse pneumoperitoneum and trace pelvic free fluid, consistent with indwelling peritoneal dialysis catheter. 2. Small bilateral pleural effusions with bilateral lower lobe atelectasis. 3. Otherwise no acute intra-abdominal or intrapelvic process on this exam limited by the lack of intravenous and oral contrast. 4. Decreased size of the abdominal aortic aneurysm after endoluminal stent graft repair. Evaluation limited without IV contrast. Electronically Signed   By: Randa Ngo M.D.   On: 06/18/2020 17:38   DG Chest 2 View  Result Date: 06/18/2020 CLINICAL DATA:  75 year old male with COVID symptoms. Fever, nausea vomiting. Back pain. EXAM: CHEST - 2 VIEW COMPARISON:  Chest radiograph dated 05/06/2020. FINDINGS: Right sided dialysis catheter with tip at the cavoatrial junction. Small bilateral pleural effusions with bibasilar atelectasis. Pneumonia is not excluded. Overall significant interval decrease in the size of the pleural effusion and improvement of pulmonary aeration compared to prior radiograph. No pneumothorax. Mild cardiomegaly. Atherosclerotic calcification of the aorta. There is crescentic air under the right hemidiaphragm along the liver suspicious for pneumoperitoneum. Further evaluation with CT of the abdomen pelvis recommended. No acute osseous pathology. IMPRESSION: 1. Overall significant interval decrease in the size of the pleural effusion and improvement of pulmonary aeration compared to prior radiograph. 2. Findings suspicious for pneumoperitoneum. Further evaluation with CT of the abdomen pelvis recommended. These results were called by telephone at the time of interpretation on 06/18/2020 at 4:45 pm to provider [facility] , who verbally acknowledged these results. Electronically Signed   By: Anner Crete M.D.   On: 06/18/2020 16:49   PERIPHERAL VASCULAR CATHETERIZATION  Result Date: 06/19/2020 See op  note      Impression / Plan:   1, Anemia exacerbation, melena, chronic anticoagulation. PUD in ddx. Agree with transfusion prn, PPI and would recommend starting eval with EGD as clinically feasible- would also  benefit from colonoscopy. He is currently hemodynamically stable.  Thank you very much for this consult. These services were provided by Stephens November, NP-C, in collaboration with Lesly Rubenstein, MD, with whom I have discussed this patient in full.   Stephens November, NP-C

## 2020-06-20 DIAGNOSIS — K922 Gastrointestinal hemorrhage, unspecified: Secondary | ICD-10-CM | POA: Diagnosis not present

## 2020-06-20 LAB — COMPREHENSIVE METABOLIC PANEL
ALT: 11 U/L (ref 0–44)
AST: 33 U/L (ref 15–41)
Albumin: 2.8 g/dL — ABNORMAL LOW (ref 3.5–5.0)
Alkaline Phosphatase: 43 U/L (ref 38–126)
Anion gap: 22 — ABNORMAL HIGH (ref 5–15)
BUN: 130 mg/dL — ABNORMAL HIGH (ref 8–23)
CO2: 19 mmol/L — ABNORMAL LOW (ref 22–32)
Calcium: 7.6 mg/dL — ABNORMAL LOW (ref 8.9–10.3)
Chloride: 102 mmol/L (ref 98–111)
Creatinine, Ser: 15.64 mg/dL — ABNORMAL HIGH (ref 0.61–1.24)
GFR, Estimated: 3 mL/min — ABNORMAL LOW (ref >60)
Glucose, Bld: 92 mg/dL (ref 70–99)
Potassium: 5.6 mmol/L — ABNORMAL HIGH (ref 3.5–5.1)
Sodium: 143 mmol/L (ref 135–145)
Total Bilirubin: 0.8 mg/dL (ref 0.3–1.2)
Total Protein: 6 g/dL — ABNORMAL LOW (ref 6.5–8.1)

## 2020-06-20 LAB — C-REACTIVE PROTEIN: CRP: 6.7 mg/dL — ABNORMAL HIGH (ref <1.0)

## 2020-06-20 LAB — CBC
HCT: 24.7 % — ABNORMAL LOW (ref 39.0–52.0)
Hemoglobin: 8.2 g/dL — ABNORMAL LOW (ref 13.0–17.0)
MCH: 33.1 pg (ref 26.0–34.0)
MCHC: 33.2 g/dL (ref 30.0–36.0)
MCV: 99.6 fL (ref 80.0–100.0)
Platelets: 89 10*3/uL — ABNORMAL LOW (ref 150–400)
RBC: 2.48 MIL/uL — ABNORMAL LOW (ref 4.22–5.81)
RDW: 15.9 % — ABNORMAL HIGH (ref 11.5–15.5)
WBC: 9.4 10*3/uL (ref 4.0–10.5)
nRBC: 0.3 % — ABNORMAL HIGH (ref 0.0–0.2)

## 2020-06-20 LAB — FERRITIN: Ferritin: 1423 ng/mL — ABNORMAL HIGH (ref 24–336)

## 2020-06-20 MED ORDER — METOPROLOL TARTRATE 5 MG/5ML IV SOLN
5.0000 mg | Freq: Four times a day (QID) | INTRAVENOUS | Status: DC
  Administered 2020-06-20: 5 mg via INTRAVENOUS
  Filled 2020-06-20: qty 5

## 2020-06-20 MED ORDER — CARVEDILOL 6.25 MG PO TABS
6.2500 mg | ORAL_TABLET | Freq: Two times a day (BID) | ORAL | Status: DC
  Administered 2020-06-21 – 2020-06-22 (×4): 6.25 mg via ORAL
  Filled 2020-06-20 (×7): qty 1

## 2020-06-20 MED ORDER — GUAIFENESIN-DM 100-10 MG/5ML PO SYRP
5.0000 mL | ORAL_SOLUTION | ORAL | Status: DC | PRN
  Administered 2020-06-21 – 2020-06-26 (×5): 5 mL via ORAL
  Filled 2020-06-20 (×6): qty 5

## 2020-06-20 MED ORDER — CLONIDINE HCL 0.1 MG/24HR TD PTWK
0.1000 mg | MEDICATED_PATCH | TRANSDERMAL | Status: DC
  Filled 2020-06-20: qty 1

## 2020-06-20 MED ORDER — ACETAMINOPHEN 650 MG RE SUPP
650.0000 mg | RECTAL | Status: DC | PRN
  Administered 2020-06-23 – 2020-06-25 (×5): 650 mg via RECTAL
  Filled 2020-06-20 (×6): qty 1

## 2020-06-20 MED ORDER — CHLORHEXIDINE GLUCONATE CLOTH 2 % EX PADS
6.0000 | MEDICATED_PAD | Freq: Every day | CUTANEOUS | Status: DC
  Administered 2020-06-21 – 2020-06-26 (×6): 6 via TOPICAL

## 2020-06-20 NOTE — Progress Notes (Signed)
Pt in bed resting quietly. Daughter at bedside. Daughter asked about pt's dialysis . I called on call dialysis and was told that pt was not in room/ unit before they left today. Chatted with on call Dr. Sidney Ace. Made aware of pt being NPO and that dialysis was not coming tonight to do dialysis. Called charge nurse and [facility] both are aware pt not going to receive dialysis till the am. SCD on with tele and o2 monitor. No c/o pain or sob at this time. O2 on via [place] '@1'$  l/ml. Will continue to monitor.

## 2020-06-20 NOTE — Progress Notes (Signed)
Patient having some right sided chest pain. Seems worse with palpation.  Patient unable to describe pain, just pointing to sight of pain.   Patient does have a cough and PD going.  Some discomfort from filling process as well.    Gave tylenol for pain and Paged MD.

## 2020-06-20 NOTE — Progress Notes (Signed)
Scott Oconnor  MRN: [id number]  DOB/AGE: [birth year] 75 y.o.  Primary Care Physician:Kalisetti, Hart Rochester, MD  Admit date: 06/18/2020  Chief Complaint:  Chief Complaint  Patient presents with  . Emesis  . Back Pain    S-Pt presented on  06/18/2020 with  Chief Complaint  Patient presents with  . Emesis  . Back Pain  . Patient offers no specific complaints. Patient daughter was present in the room her major complaint was patient was not initiated on peritoneal dialysis last night. I apologized for the family and empathized with the family. I then reached out to the RN to help set up with the patient PD   Medications   . Chlorhexidine Gluconate Cloth  6 each Topical Daily  . cloNIDine  0.1 mg Transdermal Weekly  . gentamicin cream  1 application Topical Daily  . metoprolol tartrate  5 mg Intravenous Q6H  . [START ON 06/22/2020] pantoprazole  40 mg Intravenous Q12H         [id number] from the symptoms mentioned above,there are no other symptoms referable to all systems reviewed.  Physical Exam: Vital signs in last 24 hours: Temp:  [98.2 F (36.8 C)-101.6 F (38.7 C)] 98.7 F (37.1 C) (01/29 1223) Pulse Rate:  [57-86] 57 (01/29 1700) Resp:  [11-20] 12 (01/29 1700) BP: (154-181)/(58-77) 177/71 (01/29 1700) SpO2:  [93 %-100 %] 100 % (01/29 1700) Weight:  [55.1 kg] 55.1 kg (01/29 0519) Weight change: 3.1 kg    Intake/Output from previous day: 01/28 0701 - 01/29 0700 In: 771.3 [Blood:771.3] Out: -  No intake/output data recorded.   Physical Exam:  General- pt is lethargic but arousable follows commands.  Resp- No acute REsp distress, CTA B/L NO Rhonchi  CVS- S1S2 regular in rate and rhythm  GIT- BS+, soft, Non tender , Non distended  EXT- No LE Edema,  No Cyanosis  Access-PD catheter in situ  Lab Results:  CBC  Recent Labs    06/19/20 2015 06/20/20 0521  WBC 8.7 9.4  HGB 8.7* 8.2*  HCT 26.9* 24.7*  PLT 98* 89*    BMET  Recent Labs     06/18/20 1736 06/20/20 0521  NA 140 143  K 4.5 5.6*  CL 96* 102  CO2 22 19*  GLUCOSE 142* 92  BUN 92* 130*  CREATININE 13.44* 15.64*  CALCIUM 8.2* 7.6*      Most recent Creatinine trend  Lab Results  Component Value Date   CREATININE 15.64 (H) 06/20/2020   CREATININE 13.44 (H) 06/18/2020   CREATININE 11.51 (H) 06/08/2020      MICRO   No results found for this or any previous visit (from the past 240 hour(s)).       Impression:  Scott Oconnor is a 75 y.o. [place]  male with end stage renal disease on peritoneal dialysis, hypertension, AAA repair, congestive heart failure, peripheral vascular disease, raynaud's syndrome, hyperlipidemia who is admitted to [facility] on 06/18/2020 for Acute GI bleeding [K92.2]  [facility] 52.5kg CCPD 9 hours 5 exchanges 2060m fills   1)Renal    End-stage renal disease Patient is on peritoneal dialysis Patient was undergoing peritoneal dialysis Home training when patient daughter had COVID-19. Patient thereafter was diagnosed with COVID-19 as well. Patient is now inpatient and is on peritoneal dialysis   2)HTN    Blood pressure is stable    3)Anemia of multiple etiologies Anemia of chronic disease Patient also had GI bleed  CBC Latest Ref Rng & Units 06/20/2020  06/19/2020 06/19/2020  WBC 4.0 - 10.5 K/uL 9.4 8.7 8.2  Hemoglobin 13.0 - 17.0 g/dL 8.2(L) 8.7(L) 6.2(L)  Hematocrit 39.0 - 52.0 % 24.7(L) 26.9(L) 19.6(L)  Platelets 150 - 400 K/uL 89(L) 98(L) 89(L)       HGb is not at goal (9--11) but patient hemoglobin is better than before Patient did receive PRBC   4) Secondary hyperparathyroidism -CKD Mineral-Bone Disorder    Lab Results  Component Value Date   CALCIUM 7.6 (L) 06/20/2020   PHOS 8.2 (H) 11/23/2019   PHOS 8.2 (H) 11/23/2019    Secondary Hyperparathyroidism present  Phosphorus is not at goal. Patient is not on binders secondary to GI bleed We will recheck patient  phosphorus levels  5) COVID-19 Primary team is following  6) Electrolytes   BMP Latest Ref Rng & Units 06/20/2020 06/18/2020 06/08/2020  Glucose 70 - 99 mg/dL 92 142(H) 117(H)  BUN 8 - 23 mg/dL 130(H) 92(H) 70(H)  Creatinine 0.61 - 1.24 mg/dL 15.64(H) 13.44(H) 11.51(H)  BUN/Creat Ratio 6 - 22 (calc) - - -  Sodium 135 - 145 mmol/L 143 140 142  Potassium 3.5 - 5.1 mmol/L 5.6(H) 4.5 4.9  Chloride 98 - 111 mmol/L 102 96(L) 101  CO2 22 - 32 mmol/L 19(L) 22 23  Calcium 8.9 - 10.3 mg/dL 7.6(L) 8.2(L) 8.7(L)     Sodium Normonatremic   Potassium Hyperkalemia This is most likely secondary to GI bleed and ESRD Patient is on peritoneal dialysis   7)Acid base    Co2 is not at goal Secondary to GI losses and ESRD    Plan:   We will continue patient on PD today I apologized to the family and the start of the PD nurse      Manpreet s Theador Hawthorne 06/20/2020, 6:55 PM

## 2020-06-20 NOTE — Progress Notes (Incomplete)
Peniel Andelin  [id number] DOB: 1945-06-22 DOA: 06/18/2020 PCP: [name], MD    Brief Narrative:  75 year old with a history of ESRD recently transitioned to PD, anemia of CKD, HTN, AAA repair with endovascular stent, chronic systolic CHF, PVD, and 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 In the ED he was found to have acute worsening of his anemia with hemoglobin of 7.3 from baseline of 10.5. He was also found to be Covid positive though CXR noted no infiltrate.  Significant Events:  1/27 admit via ED  Date of Positive COVID Test:  1/27  Vaccination Status: Vaccinated x2 + boosted   COVID-19 specific Treatment: Remdesivir for 3 day course refused by family   Antimicrobials:  None  DVT prophylaxis: SCDs  Subjective: Much more lethargic today.  Low-grade fever at 101.6 as of 5 AM today.  Blood pressure poorly controlled with inability to take oral medications.  Saturations 99% on minimal conventional nasal cannula support.  Assessment & Plan:  Acute blood loss anemia on anemia of CKD Baseline hemoglobin approximate 10.5 - GIB is etiology - transfuse as needed when hemoglobin drops below 7.0 - had a favorable response to 1U PRBC 1/28   Recent Labs  Lab 06/18/20 1858 06/19/20 1258 06/19/20 2015 06/20/20 0521  HGB 7.3* 6.2* 8.7* 8.2*    UGIB - Melena  On eliquis - GI following - cont protonix gtt - transfuse as needed  CoViD+ - Incidental / Asymptomatic +/- gastroenteritis  No signif pulmon sx at present - given his high risk status I advised 3 day infusion of Remdesivir as per the "PINETREE" trial protocol - the pt deferred to his daughter who does not wish to follow this recommendation at this time   Toxic metabolic encephalopathy Likely being driven by uremia w/ BUN 130 in setting of ESRD and UGIB - dialysis  per Nephrology   ESRD on peritoneal dialysis Nephrology following  HTN Transition to IV BP meds as able given AMS  Chronic systolic CHF No acute exacerbation at present - volume control per dialysis   Code Status: FULL CODE Family Communication:  Status is: Inpatient  Remains inpatient appropriate because:Inpatient level of care appropriate due to severity of illness   Dispo: The patient is from: Home              Anticipated d/c is to: Home              Anticipated d/c date is: 3 days              Patient currently is not medically stable to d/c.   Difficult to place patient No  Consultants:  Nephrology GI   Objective: Blood pressure (!) 178/77, pulse 86, temperature 99 F (37.2 C), temperature source Axillary, resp. rate 20, height '5\' 10"'$  (1.778 m), weight 55.1 kg, SpO2 98 %.  Intake/Output Summary (Last 24 hours) at 06/20/2020 0943 Last data filed at 06/19/2020 1830 Gross per 24 hour  Intake 771.33 ml  Output -  Net 771.33 ml   Filed Weights   06/18/20 1550 06/20/20 0519  Weight: 52 kg 55.1 kg    Examination: General: No acute respiratory distress Lungs: Clear to auscultation bilaterally without wheezes or crackles Cardiovascular: Regular rate and rhythm without murmur gallop or rub normal S1 and S2 Abdomen: Nontender, nondistended, soft, bowel sounds positive, no rebound,  no ascites, no appreciable mass - PD cath insertion unremarkable  Extremities: No significant cyanosis, clubbing, or edema bilateral lower extremities  CBC: Recent Labs  Lab 06/19/20 1258 06/19/20 2015 06/20/20 0521  WBC 8.2 8.7 9.4  HGB 6.2* 8.7* 8.2*  HCT 19.6* 26.9* 24.7*  MCV 104.3* 100.4* 99.6  PLT 89* 98* 89*   Basic Metabolic Panel: Recent Labs  Lab 06/18/20 1736 06/20/20 0521  NA 140 143  K 4.5 5.6*  CL 96* 102  CO2 22 19*  GLUCOSE 142* 92  BUN 92* 130*  CREATININE 13.44* 15.64*  CALCIUM 8.2* 7.6*   GFR: Estimated Creatinine Clearance: 3.3 mL/min (A) (by C-G  formula based on SCr of 15.64 mg/dL (H)).  Liver Function Tests: Recent Labs  Lab 06/18/20 1736 06/20/20 0521  AST 35 33  ALT 13 11  ALKPHOS 39 43  BILITOT 0.9 0.8  PROT 6.4* 6.0*  ALBUMIN 2.9* 2.8*    HbA1C: Hemoglobin A1C  Date/Time Value Ref Range Status  09/19/2019 11:00 AM 6.2 (A) 4.0 - 5.6 % Final   Hgb A1c MFr Bld  Date/Time Value Ref Range Status  11/19/2019 05:24 PM 5.3 4.8 - 5.6 % Final    Comment:    (NOTE)         Prediabetes: 5.7 - 6.4         Diabetes: >6.4         Glycemic control for adults with diabetes: <7.0     Scheduled Meds: . sodium chloride   Intravenous Once  . carvedilol  6.25 mg Oral BID WC  . Chlorhexidine Gluconate Cloth  6 each Topical Daily  . gabapentin  300 mg Oral QHS  . gentamicin cream  1 application Topical Daily  . hydrALAZINE  100 mg Oral TID  . [START ON 06/22/2020] pantoprazole  40 mg Intravenous Q12H     LOS: 2 days   Cherene Altes, MD [facility] Office  [phone] Pager - Text Page per Amion  If 7PM-7AM, please contact night-coverage per Amion 06/20/2020, 9:43 AM

## 2020-06-20 NOTE — Progress Notes (Signed)
Pt is now with a temp of 101.6. notified MD on call of fever. Gave tylenol po with sips of water placed cold wet washcloth on head, removed his scd and soaks to allow heat to escape pt's body. Will place to be seen in am. Daughter at bedside. Will continue to monitor.

## 2020-06-20 NOTE — Progress Notes (Signed)
Scott Oconnor  [id number] DOB: [birth year] DOA: 06/18/2020 PCP: [name], MD    Brief Narrative:  75 year old with a history of ESRD recently transitioned to PD, anemia of CKD, HTN, AAA repair with endovascular stent, chronic systolic CHF, PVD, and 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 In the ED he was found to have acute worsening of his anemia with hemoglobin of 7.3 from baseline of 10.5. He was also found to be Covid positive though CXR noted no infiltrate.  Significant Events:  1/27 admit via ED  Date of Positive COVID Test:  1/27  Vaccination Status: Vaccinated x2 + boosted   COVID-19 specific Treatment: Remdesivir for 3 day course refused by family   Antimicrobials:  None  DVT prophylaxis: SCDs  Subjective: Much more lethargic today.  Low-grade fever at 101.6 as of 5 AM today.  Blood pressure poorly controlled with inability to take oral medications.  Saturations 99% on minimal conventional nasal cannula support. Not in extremis at time of exam.   Assessment & Plan:  Acute blood loss anemia on anemia of CKD Baseline hemoglobin approximately 10.5 - GIB is etiology - transfuse as needed when hemoglobin drops below 7.0 - had a favorable response to 1U PRBC 1/28   Recent Labs  Lab 06/18/20 1858 06/19/20 1258 06/19/20 2015 06/20/20 0521  HGB 7.3* 6.2* 8.7* 8.2*    UGIB - Melena  On eliquis - GI following - cont protonix gtt - transfuse as needed - does not appear he will require urgent endoscopy at this time   CoViD+ - Incidental / Asymptomatic +/- gastroenteritis  No signif pulmon sx at present - given his high risk status I advised 3 day infusion of Remdesivir as per the "PINETREE" trial protocol - the pt deferred to his daughter who does not wish to follow this recommendation at this time   Toxic  metabolic encephalopathy Likely being driven by uremia w/ BUN 130 in setting of ESRD and UGIB - dialysis per Nephrology   ESRD on peritoneal dialysis Nephrology following  HTN Transition to IV BP meds as able given AMS  Chronic systolic CHF No acute exacerbation at present - volume control per dialysis   Code Status: FULL CODE Family Communication: spoke w/ daughter at bedside  Status is: Inpatient  Remains inpatient appropriate because:Inpatient level of care appropriate due to severity of illness   Dispo: The patient is from: Home              Anticipated d/c is to: Home              Anticipated d/c date is: 3 days              Patient currently is not medically stable to d/c.   Difficult to place patient No  Consultants:  Nephrology GI   Objective: Blood pressure (!) 165/66, pulse 63, temperature 98.7 F (37.1 C), temperature source Axillary, resp. rate 17, height '5\' 10"'$  (1.778 m), weight 55.1 kg, SpO2 99 %.  Intake/Output Summary (Last 24 hours) at 06/20/2020 1520 Last data filed at 06/19/2020 1830 Gross per 24 hour  Intake 771.33 ml  Output --  Net 771.33 ml   Filed Weights   06/18/20 1550 06/20/20 0519  Weight: 52 kg 55.1 kg    Examination: General: No acute respiratory distress - lethargic  Lungs: clear th/o -  no wheezing  Cardiovascular: RRR  Abdomen: NT/ND, soft, bowel sounds positive, no rebound, no ascites, no appreciable mass - PD cath insertion unremarkable  Extremities: No signif edema bilateral lower extremities  CBC: Recent Labs  Lab 06/19/20 1258 06/19/20 2015 06/20/20 0521  WBC 8.2 8.7 9.4  HGB 6.2* 8.7* 8.2*  HCT 19.6* 26.9* 24.7*  MCV 104.3* 100.4* 99.6  PLT 89* 98* 89*   Basic Metabolic Panel: Recent Labs  Lab 06/18/20 1736 06/20/20 0521  NA 140 143  K 4.5 5.6*  CL 96* 102  CO2 22 19*  GLUCOSE 142* 92  BUN 92* 130*  CREATININE 13.44* 15.64*  CALCIUM 8.2* 7.6*   GFR: Estimated Creatinine Clearance: 3.3 mL/min (A) (by C-G  formula based on SCr of 15.64 mg/dL (H)).  Liver Function Tests: Recent Labs  Lab 06/18/20 1736 06/20/20 0521  AST 35 33  ALT 13 11  ALKPHOS 39 43  BILITOT 0.9 0.8  PROT 6.4* 6.0*  ALBUMIN 2.9* 2.8*    HbA1C: Hemoglobin A1C  Date/Time Value Ref Range Status  09/19/2019 11:00 AM 6.2 (A) 4.0 - 5.6 % Final   Hgb A1c MFr Bld  Date/Time Value Ref Range Status  11/19/2019 05:24 PM 5.3 4.8 - 5.6 % Final    Comment:    (NOTE)         Prediabetes: 5.7 - 6.4         Diabetes: >6.4         Glycemic control for adults with diabetes: <7.0     Scheduled Meds: . Chlorhexidine Gluconate Cloth  6 each Topical Daily  . cloNIDine  0.1 mg Transdermal Weekly  . gentamicin cream  1 application Topical Daily  . metoprolol tartrate  5 mg Intravenous Q6H  . [START ON 06/22/2020] pantoprazole  40 mg Intravenous Q12H     LOS: 2 days   Cherene Altes, MD [facility] Office  [phone] Pager - Text Page per Shea Evans  If 7PM-7AM, please contact night-coverage per Amion 06/20/2020, 3:20 PM

## 2020-06-20 NOTE — Progress Notes (Signed)
   06/20/20 0942  Assess: MEWS Score  Level of Consciousness Responds to Pain  Assess: MEWS Score  MEWS Temp 0  MEWS Systolic 0  MEWS Pulse 0  MEWS RR 0  MEWS LOC 2  MEWS Score 2  MEWS Score Color Yellow  Assess: if the MEWS score is Yellow or Red  Were vital signs taken at a resting state? Yes  Focused Assessment Change from prior assessment (see assessment flowsheet)  Early Detection of Sepsis Score *See Row Information* Low  MEWS guidelines implemented *See Row Information* Yes  Notify: Charge Nurse/RN  Name of Charge Nurse/RN Notified Colletta [place] Rn  Date Charge Nurse/RN Notified 06/20/20  Time Charge Nurse/RN Notified 1000  Notify: Provider  Provider Name/Title Dr. Thereasa Solo  Date Provider Notified 06/20/20  Time Provider Notified 1000  Notification Type Page (secure chat)  Notification Reason Change in status  Response See new orders  Date of Provider Response 06/20/20  Time of Provider Response 1015  Document  Patient Outcome Stabilized after interventions  Progress note created (see row info) Yes   Patient would not open his eyes nor respond to commands and questions.  Per Daughter- very concerned that patient has not had dialysis  yet.   Charge nurse was informed, dialysis was called but was unable to tell us a time.   Patient's daughter very furious about patient missing dialysis.  AD and MD was notified.     Patient awaiting dialysis

## 2020-06-20 NOTE — Progress Notes (Incomplete)
Recheck temp was coming down . Now temp is 101.0 oral.

## 2020-06-20 NOTE — Progress Notes (Signed)
GI Inpatient Follow-up Note  Subjective:  Patient slightly lethargic. No bowel movements or GI bleeding. Has not yet had dialysis. Had good response to blood transfusion.  Scheduled Inpatient Medications:  . Chlorhexidine Gluconate Cloth  6 each Topical Daily  . cloNIDine  0.1 mg Transdermal Weekly  . gentamicin cream  1 application Topical Daily  . metoprolol tartrate  5 mg Intravenous Q6H  . [START ON 06/22/2020] pantoprazole  40 mg Intravenous Q12H    Continuous Inpatient Infusions:   . dialysis solution 1.5% low-MG/low-CA    . pantoprozole (PROTONIX) infusion 8 mg/hr (06/20/20 0200)    PRN Inpatient Medications:  acetaminophen, acetaminophen, calcium carbonate  Review of Systems:  Unable to assess due to mental status   Physical Examination: BP (!) 165/66 (BP Location: Left Arm)   Pulse 63   Temp 98.7 F (37.1 C) (Axillary)   Resp 17   Ht '5\' 10"'$  (1.778 m)   Wt 55.1 kg   SpO2 99%   BMI 17.43 kg/m  Gen: lethargic HEENT: PEERLA Neck: supple Chest: No respiratory distress Abd: soft, NT, ND Ext: no edema Skin: no rash or lesions noted Lymph: no LAD  Data: Lab Results  Component Value Date   WBC 9.4 06/20/2020   HGB 8.2 (L) 06/20/2020   HCT 24.7 (L) 06/20/2020   MCV 99.6 06/20/2020   PLT 89 (L) 06/20/2020   Recent Labs  Lab 06/19/20 1258 06/19/20 2015 06/20/20 0521  HGB 6.2* 8.7* 8.2*   Lab Results  Component Value Date   NA 143 06/20/2020   K 5.6 (H) 06/20/2020   CL 102 06/20/2020   CO2 19 (L) 06/20/2020   BUN 130 (H) 06/20/2020   CREATININE 15.64 (H) 06/20/2020   Lab Results  Component Value Date   ALT 11 06/20/2020   AST 33 06/20/2020   ALKPHOS 43 06/20/2020   BILITOT 0.8 06/20/2020   No results for input(s): APTT, INR, PTT in the last 168 hours. Assessment/Plan: Mr. Dembinski is a 75 y.o. gentleman with history of ESRD and now COVID who presented with dark stool and drop in hemoglobin. Given symptoms and no further bowel movements,  suspect COVID related gastroenteritis which can present without respiratory symptoms. Right now the biggest need that he has is dialysis as he has been without it for several sessions.  Recommendations:  - continue supportive care - maintain active type and screen and transfuse if hemoglobin < 7 - continue PPI IV BID - monitor for any GI bleeding but hasn't had a bowel movement since yesterday - ok to advance diet as no plans for any GI procedures at this time  Please call with any questions or concerns   Raylene Miyamoto MD, MPH [place]

## 2020-06-21 DIAGNOSIS — K922 Gastrointestinal hemorrhage, unspecified: Secondary | ICD-10-CM | POA: Diagnosis not present

## 2020-06-21 LAB — CBC
HCT: 23.7 % — ABNORMAL LOW (ref 39.0–52.0)
Hemoglobin: 8 g/dL — ABNORMAL LOW (ref 13.0–17.0)
MCH: 33.1 pg (ref 26.0–34.0)
MCHC: 33.8 g/dL (ref 30.0–36.0)
MCV: 97.9 fL (ref 80.0–100.0)
Platelets: 89 10*3/uL — ABNORMAL LOW (ref 150–400)
RBC: 2.42 MIL/uL — ABNORMAL LOW (ref 4.22–5.81)
RDW: 15.8 % — ABNORMAL HIGH (ref 11.5–15.5)
WBC: 7.4 10*3/uL (ref 4.0–10.5)
nRBC: 0 % (ref 0.0–0.2)

## 2020-06-21 LAB — RENAL FUNCTION PANEL
Albumin: 2.6 g/dL — ABNORMAL LOW (ref 3.5–5.0)
Anion gap: 22 — ABNORMAL HIGH (ref 5–15)
BUN: 128 mg/dL — ABNORMAL HIGH (ref 8–23)
CO2: 19 mmol/L — ABNORMAL LOW (ref 22–32)
Calcium: 7.6 mg/dL — ABNORMAL LOW (ref 8.9–10.3)
Chloride: 98 mmol/L (ref 98–111)
Creatinine, Ser: 15.49 mg/dL — ABNORMAL HIGH (ref 0.61–1.24)
GFR, Estimated: 3 mL/min — ABNORMAL LOW (ref >60)
Glucose, Bld: 116 mg/dL — ABNORMAL HIGH (ref 70–99)
Phosphorus: 9.5 mg/dL — ABNORMAL HIGH (ref 2.5–4.6)
Potassium: 4.8 mmol/L (ref 3.5–5.1)
Sodium: 139 mmol/L (ref 135–145)

## 2020-06-21 MED ORDER — RENA-VITE PO TABS
1.0000 | ORAL_TABLET | Freq: Every day | ORAL | Status: DC
  Filled 2020-06-21: qty 1

## 2020-06-21 MED ORDER — GUAIFENESIN ER 600 MG PO TB12
600.0000 mg | ORAL_TABLET | Freq: Two times a day (BID) | ORAL | Status: DC
  Administered 2020-06-21 – 2020-06-22 (×4): 600 mg via ORAL
  Filled 2020-06-21 (×8): qty 1

## 2020-06-21 MED ORDER — RENA-VITE PO TABS
1.0000 | ORAL_TABLET | Freq: Every day | ORAL | Status: DC
  Administered 2020-06-21 – 2020-06-22 (×2): 1 via ORAL
  Filled 2020-06-21 (×7): qty 1

## 2020-06-21 MED ORDER — ACETAMINOPHEN 500 MG PO TABS
500.0000 mg | ORAL_TABLET | Freq: Four times a day (QID) | ORAL | Status: DC | PRN
  Administered 2020-06-21 – 2020-06-22 (×3): 1000 mg via ORAL
  Filled 2020-06-21 (×4): qty 2

## 2020-06-21 MED ORDER — HYDRALAZINE HCL 50 MG PO TABS
100.0000 mg | ORAL_TABLET | Freq: Three times a day (TID) | ORAL | Status: DC
  Administered 2020-06-21 – 2020-06-22 (×3): 100 mg via ORAL
  Filled 2020-06-21 (×4): qty 2

## 2020-06-21 MED ORDER — IRBESARTAN 150 MG PO TABS
150.0000 mg | ORAL_TABLET | Freq: Every evening | ORAL | Status: DC
  Administered 2020-06-22: 150 mg via ORAL
  Filled 2020-06-21 (×2): qty 1

## 2020-06-21 MED ORDER — POLYETHYLENE GLYCOL 3350 17 G PO PACK
17.0000 g | PACK | Freq: Every day | ORAL | Status: DC
  Administered 2020-06-21: 17 g via ORAL
  Filled 2020-06-21 (×4): qty 1

## 2020-06-21 MED ORDER — ATORVASTATIN CALCIUM 20 MG PO TABS
40.0000 mg | ORAL_TABLET | Freq: Every day | ORAL | Status: DC
  Administered 2020-06-21 – 2020-06-22 (×2): 40 mg via ORAL
  Filled 2020-06-21 (×5): qty 2

## 2020-06-21 NOTE — Progress Notes (Signed)
Dialysis nurse has not come and pt's daughter asking about whether nurse will come tonight and if it is even needed as daughter did exchange earlier.  Attempted to reach dialysis nurse and spoke to [facility] charge nurse as well.  Was eventually told dialysis nurse left for the night.  This nurse went in and notified daughter who said she feels like he doesn't need another exchange anyway ntil day.  Charge nurse, Chief Operating Officer  notified.

## 2020-06-21 NOTE — Progress Notes (Signed)
Gevena Oconnor  [id number] DOB: [birth year] DOA: 06/18/2020 PCP: [name], MD    Brief Narrative:  75yo with a history of ESRD recently transitioned to PD, anemia of CKD, HTN, AAA repair with endovascular stent, chronic systolic CHF, PVD, and 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 In the ED he was found to have acute worsening of his anemia with hemoglobin of 7.3 from baseline of 10.5. He was also found to be Covid positive though CXR noted no infiltrate.  Significant Events:  1/27 admit via ED  Date of Positive COVID Test:  1/27  Vaccination Status: Vaccinated x2 + boosted   COVID-19 specific Treatment: Remdesivir for 3 day course refused by family   Antimicrobials:  None  DVT prophylaxis: SCDs  Subjective: Afebrile.  Blood pressure improved but not yet at goal.  Saturations 97% on room air.  Mental status improving.  Patient opens his eyes and looks at examiner.  Denies chest pain or abdominal pain.  Assessment & Plan:  Acute blood loss anemia on anemia of CKD Baseline hemoglobin approximately 10.5 - GIB is etiology - transfuse as needed when hemoglobin drops below 7.0 - had a favorable response to 1U PRBC 1/28 - hemoglobin holding steady for now suggesting bleeding may have stopped  Recent Labs  Lab 06/18/20 1858 06/19/20 1258 06/19/20 2015 06/20/20 0521 06/21/20 0416  HGB 7.3* 6.2* 8.7* 8.2* 8.0*    UGIB - Melena  On eliquis - GI following - cont protonix gtt with scheduled transition to intermittent IV dosing - transfuse as needed - does not appear he will require urgent endoscopy at this time -hemoglobin trend suggests bleeding has stopped  CoViD+ - Incidental / Asymptomatic +/- gastroenteritis  No signif pulmon sx at present - given his high risk status I advised 3 day infusion of Remdesivir as per  the "PINETREE" trial protocol - the pt deferred to his daughter who does not wish to follow this recommendation at this time   Toxic metabolic encephalopathy Likely being driven by uremia w/ BUN 130 in setting of ESRD and UGIB -peritoneal dialysis per Nephrology   ESRD on peritoneal dialysis Nephrology following  HTN Poorly controlled - resuming usual oral meds w/ improving mental status   Chronic systolic CHF No acute exacerbation at present - volume control per dialysis   Code Status: FULL CODE Family Communication: spoke w/ daughter at bedside  Status is: Inpatient  Remains inpatient appropriate because:Inpatient level of care appropriate due to severity of illness   Dispo: The patient is from: Home              Anticipated d/c is to: Home              Anticipated d/c date is: 3 days              Patient currently is not medically stable to d/c.   Difficult to place patient No  Consultants:  Nephrology GI   Objective: Blood pressure (!) 159/64, pulse 71, temperature 98.3 F (36.8 C), temperature source Oral, resp. rate 20, height '5\' 10"'$  (1.778 m), weight 55.1 kg, SpO2 97 %.  Intake/Output Summary (Last 24 hours) at 06/21/2020 1008 Last data filed at 06/21/2020 0400 Gross per 24 hour  Intake 673.22 ml  Output --  Net 673.22 ml   Filed Weights   06/18/20 1550 06/20/20  0519  Weight: 52 kg 55.1 kg    Examination: General: No acute respiratory distress - more alert today  Lungs: CTA B - no wheezing  Cardiovascular: RRR w/o M  Abdomen: NT/ND, soft, bowel sounds positive, no rebound appreciable mass - PD cath insertion unremarkable  Extremities: No signif edema B LE   CBC: Recent Labs  Lab 06/19/20 2015 06/20/20 0521 06/21/20 0416  WBC 8.7 9.4 7.4  HGB 8.7* 8.2* 8.0*  HCT 26.9* 24.7* 23.7*  MCV 100.4* 99.6 97.9  PLT 98* 89* 89*   Basic Metabolic Panel: Recent Labs  Lab 06/18/20 1736 06/20/20 0521 06/21/20 0416  NA 140 143 139  K 4.5 5.6* 4.8  CL 96*  102 98  CO2 22 19* 19*  GLUCOSE 142* 92 116*  BUN 92* 130* 128*  CREATININE 13.44* 15.64* 15.49*  CALCIUM 8.2* 7.6* 7.6*  PHOS  --   --  9.5*   GFR: Estimated Creatinine Clearance: 3.3 mL/min (A) (by C-G formula based on SCr of 15.49 mg/dL (H)).  Liver Function Tests: Recent Labs  Lab 06/18/20 1736 06/20/20 0521 06/21/20 0416  AST 35 33  --   ALT 13 11  --   ALKPHOS 39 43  --   BILITOT 0.9 0.8  --   PROT 6.4* 6.0*  --   ALBUMIN 2.9* 2.8* 2.6*    HbA1C: Hemoglobin A1C  Date/Time Value Ref Range Status  09/19/2019 11:00 AM 6.2 (A) 4.0 - 5.6 % Final   Hgb A1c MFr Bld  Date/Time Value Ref Range Status  11/19/2019 05:24 PM 5.3 4.8 - 5.6 % Final    Comment:    (NOTE)         Prediabetes: 5.7 - 6.4         Diabetes: >6.4         Glycemic control for adults with diabetes: <7.0     Scheduled Meds: . carvedilol  6.25 mg Oral BID WC  . Chlorhexidine Gluconate Cloth  6 each Topical Daily  . cloNIDine  0.1 mg Transdermal Weekly  . gentamicin cream  1 application Topical Daily  . [START ON 06/22/2020] pantoprazole  40 mg Intravenous Q12H     LOS: 3 days   Cherene Altes, MD [facility] Office  [phone] Pager - Text Page per Amion  If 7PM-7AM, please contact night-coverage per Amion 06/21/2020, 10:08 AM

## 2020-06-21 NOTE — Progress Notes (Signed)
This nurse called dialysis nurse to let her know about PD order placed tonight.  PD nurse said she is aware of that order and can be here in 3 hours.  Pt and daughter notified that PD nurse will be here in 3 hours.

## 2020-06-21 NOTE — Progress Notes (Signed)
GI Inpatient Follow-up Note  Subjective:  Patient improved today. No bowel movements.  Scheduled Inpatient Medications:  . carvedilol  6.25 mg Oral BID WC  . Chlorhexidine Gluconate Cloth  6 each Topical Daily  . cloNIDine  0.1 mg Transdermal Weekly  . gentamicin cream  1 application Topical Daily  . multivitamin  1 tablet Oral QHS  . [START ON 06/22/2020] pantoprazole  40 mg Intravenous Q12H    Continuous Inpatient Infusions:   . dialysis solution 1.5% low-MG/low-CA    . pantoprozole (PROTONIX) infusion 8 mg/hr (06/21/20 0845)    PRN Inpatient Medications:  acetaminophen, acetaminophen, calcium carbonate, guaiFENesin-dextromethorphan  Review of Systems:  Review of Systems  Constitutional: Negative for chills and fever.  Respiratory: Negative for cough.   Cardiovascular: Negative for chest pain.  Gastrointestinal: Positive for constipation. Negative for blood in stool, diarrhea, melena, nausea and vomiting.  Skin: Negative for rash.  Neurological: Positive for weakness.  Psychiatric/Behavioral: Negative for substance abuse.  All other systems reviewed and are negative.    Physical Examination: BP (!) 109/94 (BP Location: Left Arm)   Pulse 69   Temp 98.1 F (36.7 C) (Oral)   Resp 16   Ht '5\' 10"'$  (1.778 m)   Wt 55.1 kg   SpO2 99%   BMI 17.43 kg/m  Gen: More alert today HEENT: PEERLA Neck: supple Chest: No respiratory distress Abd: soft, NT, ND Ext: no edema Skin: no rash or lesions noted Lymph: no LAD  Data: Lab Results  Component Value Date   WBC 7.4 06/21/2020   HGB 8.0 (L) 06/21/2020   HCT 23.7 (L) 06/21/2020   MCV 97.9 06/21/2020   PLT 89 (L) 06/21/2020   Recent Labs  Lab 06/19/20 2015 06/20/20 0521 06/21/20 0416  HGB 8.7* 8.2* 8.0*   Lab Results  Component Value Date   NA 139 06/21/2020   K 4.8 06/21/2020   CL 98 06/21/2020   CO2 19 (L) 06/21/2020   BUN 128 (H) 06/21/2020   CREATININE 15.49 (H) 06/21/2020   Lab Results  Component  Value Date   ALT 11 06/20/2020   AST 33 06/20/2020   ALKPHOS 43 06/20/2020   BILITOT 0.8 06/20/2020   No results for input(s): APTT, INR, PTT in the last 168 hours. Assessment/Plan: Mr. Wheeland is a 75 y.o. gentleman with history of ESRD and now COVID who presented with dark stool and drop in hemoglobin. Given symptoms and no further bowel movements, suspect COVID related gastroenteritis which can present without respiratory symptoms. Improving with continued PD dialysis.  Recommendations:  - continue supportive care - maintain active type and screen and transfuse if hemoglobin < 7 - continue PPI IV BID - monitor for any GI bleeding  - advance diet - will likely see as an outpatient before deciding on further GI procedures  Will follow along. Please call with any questions or concerns   Raylene Miyamoto MD, MPH [place]

## 2020-06-21 NOTE — Progress Notes (Signed)
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 O2 sensor placed on pt's ear, o2 sats 99%.

## 2020-06-21 NOTE — Progress Notes (Signed)
Scott Oconnor  MRN: [id number]  DOB/AGE: [birth year] 75 y.o.  Primary Care Physician:Kalisetti, Hart Rochester, MD  Admit date: 06/18/2020  Chief Complaint:  Chief Complaint  Patient presents with  . Emesis  . Back Pain    S-Pt presented on  06/18/2020 with  Chief Complaint  Patient presents with  . Emesis  . Back Pain  . Patient offers no specific complaints as always patient daughter was present in the room.   Medications   . carvedilol  6.25 mg Oral BID WC  . Chlorhexidine Gluconate Cloth  6 each Topical Daily  . cloNIDine  0.1 mg Transdermal Weekly  . gentamicin cream  1 application Topical Daily  . multivitamin  1 tablet Oral QHS  . [START ON 06/22/2020] pantoprazole  40 mg Intravenous Q12H         [id number] from the symptoms mentioned above,there are no other symptoms referable to all systems reviewed.  Physical Exam: Vital signs in last 24 hours: Temp:  [98.1 F (36.7 C)-98.4 F (36.9 C)] 98.1 F (36.7 C) (01/30 1157) Pulse Rate:  [57-71] 69 (01/30 1157) Resp:  [12-20] 16 (01/30 1157) BP: (109-185)/(60-94) 109/94 (01/30 1157) SpO2:  [86 %-100 %] 99 % (01/30 1157) Weight change:     Intake/Output from previous day: 01/29 0701 - 01/30 0700 In: 673.2 [I.V.:673.2] Out: -  Total I/O In: -  Out: 150 [Urine:150]   Physical Exam:  General- pt is less lethargic than yesterday,  follows commands.  Resp- No acute REsp distress, CTA B/L NO Rhonchi  CVS- S1S2 regular in rate and rhythm  GIT- BS+, soft, Non tender , Non distended  EXT- trace LE Edema,  No Cyanosis  Access-PD catheter in situ  Lab Results:  CBC  Recent Labs    06/20/20 0521 06/21/20 0416  WBC 9.4 7.4  HGB 8.2* 8.0*  HCT 24.7* 23.7*  PLT 89* 89*    BMET  Recent Labs    06/20/20 0521 06/21/20 0416  NA 143 139  K 5.6* 4.8  CL 102 98  CO2 19* 19*  GLUCOSE 92 116*  BUN 130* 128*  CREATININE 15.64* 15.49*  CALCIUM 7.6* 7.6*      Most recent Creatinine trend   Lab Results  Component Value Date   CREATININE 15.49 (H) 06/21/2020   CREATININE 15.64 (H) 06/20/2020   CREATININE 13.44 (H) 06/18/2020      MICRO   No results found for this or any previous visit (from the past 240 hour(s)).       Impression:  Scott Oconnor is a 75 y.o. [place] ([place])  male with end stage renal disease on peritoneal dialysis, hypertension, AAA repair, congestive heart failure, peripheral vascular disease, raynaud's syndrome, hyperlipidemia who is admitted to [facility] on 06/18/2020 for Acute GI bleeding [K92.2]  [facility] 52.5kg CCPD 9 hours 5 exchanges 2072m fills   1)Renal    End-stage renal disease Patient is on peritoneal dialysis Patient was undergoing peritoneal dialysis Home training when patient daughter had COVID-19. Patient thereafter was diagnosed with COVID-19 as well. Patient is now inpatient and is on peritoneal dialysis Patient had trace edema, minimally more swollen than earlier We will change patient's PD prescription to help with the fluid removal  2)HTN    Blood pressure is stable    3)Anemia of multiple etiologies Anemia of chronic disease Patient also had GI bleed  CBC Latest Ref Rng & Units 06/21/2020 06/20/2020 06/19/2020  WBC 4.0 - 10.5 K/uL 7.4 9.4 8.7  Hemoglobin 13.0 - 17.0 g/dL 8.0(L) 8.2(L) 8.7(L)  Hematocrit 39.0 - 52.0 % 23.7(L) 24.7(L) 26.9(L)  Platelets 150 - 400 K/uL 89(L) 89(L) 98(L)       HGb is not at goal (9--11) but patient hemoglobin is better than before Patient did receive PRBC   4) Secondary hyperparathyroidism -CKD Mineral-Bone Disorder    Lab Results  Component Value Date   CALCIUM 7.6 (L) 06/21/2020   PHOS 9.5 (H) 06/21/2020    Secondary Hyperparathyroidism present  Phosphorus is not at goal. Patient is not on binders secondary to GI bleed/fluid diet We will follow  5) COVID-19 Primary team is following  6) Electrolytes   BMP Latest Ref Rng & Units  06/21/2020 06/20/2020 06/18/2020  Glucose 70 - 99 mg/dL 116(H) 92 142(H)  BUN 8 - 23 mg/dL 128(H) 130(H) 92(H)  Creatinine 0.61 - 1.24 mg/dL 15.49(H) 15.64(H) 13.44(H)  BUN/Creat Ratio 6 - 22 (calc) - - -  Sodium 135 - 145 mmol/L 139 143 140  Potassium 3.5 - 5.1 mmol/L 4.8 5.6(H) 4.5  Chloride 98 - 111 mmol/L 98 102 96(L)  CO2 22 - 32 mmol/L 19(L) 19(L) 22  Calcium 8.9 - 10.3 mg/dL 7.6(L) 7.6(L) 8.2(L)     Sodium Normonatremic   Potassium Hyperkalemia This is most likely secondary to GI bleed and ESRD Patient is on peritoneal dialysis Now better  7)Acid base    Co2 is not at goal Secondary to GI losses and ESRD    Plan:   We will continue patient on PD today       Manpreet s Bhutani 06/21/2020, 1:12 PM

## 2020-06-22 ENCOUNTER — Encounter: Payer: Self-pay | Admitting: Vascular Surgery

## 2020-06-22 DIAGNOSIS — Z992 Dependence on renal dialysis: Secondary | ICD-10-CM | POA: Diagnosis not present

## 2020-06-22 DIAGNOSIS — N186 End stage renal disease: Secondary | ICD-10-CM | POA: Diagnosis not present

## 2020-06-22 DIAGNOSIS — K922 Gastrointestinal hemorrhage, unspecified: Secondary | ICD-10-CM | POA: Diagnosis not present

## 2020-06-22 LAB — TYPE AND SCREEN
ABO/RH(D): A POS
Antibody Screen: NEGATIVE
Unit division: 0
Unit division: 0
Unit division: 0

## 2020-06-22 LAB — CBC
HCT: 23.3 % — ABNORMAL LOW (ref 39.0–52.0)
Hemoglobin: 8 g/dL — ABNORMAL LOW (ref 13.0–17.0)
MCH: 33.2 pg (ref 26.0–34.0)
MCHC: 34.3 g/dL (ref 30.0–36.0)
MCV: 96.7 fL (ref 80.0–100.0)
Platelets: 93 10*3/uL — ABNORMAL LOW (ref 150–400)
RBC: 2.41 MIL/uL — ABNORMAL LOW (ref 4.22–5.81)
RDW: 15.9 % — ABNORMAL HIGH (ref 11.5–15.5)
WBC: 7.9 10*3/uL (ref 4.0–10.5)
nRBC: 0.3 % — ABNORMAL HIGH (ref 0.0–0.2)

## 2020-06-22 LAB — COMPREHENSIVE METABOLIC PANEL
ALT: 9 U/L (ref 0–44)
AST: 31 U/L (ref 15–41)
Albumin: 2.5 g/dL — ABNORMAL LOW (ref 3.5–5.0)
Alkaline Phosphatase: 42 U/L (ref 38–126)
Anion gap: 22 — ABNORMAL HIGH (ref 5–15)
BUN: 122 mg/dL — ABNORMAL HIGH (ref 8–23)
CO2: 19 mmol/L — ABNORMAL LOW (ref 22–32)
Calcium: 7.6 mg/dL — ABNORMAL LOW (ref 8.9–10.3)
Chloride: 99 mmol/L (ref 98–111)
Creatinine, Ser: 15.67 mg/dL — ABNORMAL HIGH (ref 0.61–1.24)
GFR, Estimated: 3 mL/min — ABNORMAL LOW (ref >60)
Glucose, Bld: 158 mg/dL — ABNORMAL HIGH (ref 70–99)
Potassium: 4.3 mmol/L (ref 3.5–5.1)
Sodium: 140 mmol/L (ref 135–145)
Total Bilirubin: 0.8 mg/dL (ref 0.3–1.2)
Total Protein: 5.7 g/dL — ABNORMAL LOW (ref 6.5–8.1)

## 2020-06-22 LAB — BPAM RBC
Blood Product Expiration Date: 202203022359
Blood Product Expiration Date: 202203022359
Blood Product Expiration Date: 202203022359
ISSUE DATE / TIME: 202201281544
Unit Type and Rh: 6200
Unit Type and Rh: 6200
Unit Type and Rh: 6200

## 2020-06-22 LAB — PREPARE RBC (CROSSMATCH)

## 2020-06-22 MED ORDER — ONDANSETRON HCL 4 MG PO TABS: 4.0000 mg | ORAL_TABLET | Freq: Every day | ORAL | 1 refills | Status: DC | PRN

## 2020-06-22 MED ORDER — LOPERAMIDE HCL 2 MG PO CAPS
2.0000 mg | ORAL_CAPSULE | ORAL | Status: DC | PRN
  Administered 2020-06-22: 2 mg via ORAL
  Filled 2020-06-22: qty 1

## 2020-06-22 MED ORDER — APIXABAN 2.5 MG PO TABS: 2.5000 mg | ORAL_TABLET | Freq: Two times a day (BID) | ORAL | 1 refills | Status: DC

## 2020-06-22 MED ORDER — ONDANSETRON HCL 4 MG/2ML IJ SOLN
4.0000 mg | Freq: Four times a day (QID) | INTRAMUSCULAR | Status: DC | PRN
  Filled 2020-06-22 (×5): qty 2

## 2020-06-22 MED ORDER — EPOETIN ALFA 40000 UNIT/ML IJ SOLN
20000.0000 [IU] | INTRAMUSCULAR | Status: DC
  Filled 2020-06-22: qty 1

## 2020-06-22 MED ORDER — EPOETIN ALFA 10000 UNIT/ML IJ SOLN
20000.0000 [IU] | INTRAMUSCULAR | Status: DC
  Administered 2020-06-22: 20000 [IU] via SUBCUTANEOUS

## 2020-06-22 MED ORDER — PANTOPRAZOLE SODIUM 40 MG PO TBEC: DELAYED_RELEASE_TABLET | ORAL | 1 refills | Status: DC

## 2020-06-22 NOTE — Progress Notes (Signed)
Went into room to go over discharge with the daughter. States patient had an incontinent episode and was trying to clean him up. This nurse stood patient to get dirty linen out from underneath patient. Pt became extremely lethargic. Assisted back to bed. VSS. MD made aware and discharge cancelled.

## 2020-06-22 NOTE — Discharge Summary (Signed)
DISCHARGE SUMMARY  Scott Oconnor  MR#: [id number]  DOB:1945-06-29  Date of Admission: 06/18/2020 Date of Discharge: 06/22/2020  Attending Physician:Monik Lins Hennie Duos, MD  Patient's [id number], Scott Rochester, MD  Consults: Nephrology  Disposition: D/C home   Date of Positive COVID Test: 06/18/20  Date Isolation Ends: 06/24/20 (i.e. 06/23/20 is last full day of isolation)  COVID-19 specific Treatment: Refused 3 day course of Remdesivir   Follow-up Appts:  Follow-up Information    Scott Lighter, MD Follow up in 5 day(s).   Specialty: Internal Medicine Contact information: [place] [address] [phone]        Scott Merritts, MD .   Specialty: Cardiology Contact information: [address] [place]  [address] [phone]               Tests Needing Follow-up: -check Hgb/CBC in 5 days - Hgb at time of d/c is 8.0 -assure pt has resumed Eliquis 7 days after d/c, and assure no repeat GI bleeding develops as a result   Discharge Diagnoses: Acute blood loss anemia Anemia of CKD UGIB - Melena  CoViD+ - Incidental / Asymptomatic +/- gastroenteritis  Toxic metabolic encephalopathy ESRD on peritoneal dialysis HTN Chronic systolic CHF  Initial presentation: 75yo with a history of ESRD recently transitioned to PD, anemia of CKD, HTN, AAA repair with endovascular stent, chronic systolic CHF, PVD, and 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 In the ED he was found to have acute worsening of his anemia with hemoglobin of 7.3 from baseline of 10.5. He was also found to be Covid positive though CXR noted no infiltrate.  Hospital Course:  Acute blood loss anemia on anemia of CKD Baseline hemoglobin approximately 10.5 - GIB was etiology - transfused as needed when hemoglobin  drops below 7.0 - had a favorable response to 1U PRBC 1/28 - hemoglobin holding steady at time of d/c suggesting bleeding has stopped   UGIB - Melena  On eliquis prior to admit - GI followed and felt this was a consequence of CoViD gastroenteritis + anticoagulation - protonix gtt utilized initially - did not require urgent endoscopy at this time - hemoglobin trend suggests bleeding has stopped at time of d/c - hold anticoag for 7 days post d/c   CoViD+ - Incidental / Asymptomatic +/- gastroenteritis  No signif pulmon sx during hospital stay - given his high risk status I advised 3 day infusion of Remdesivir as per the "PINETREE" trial protocol - the pt deferred to his daughter who does not wish to follow this recommendation at this time - did not develop signif sx of pulmonary CoViD during this admit   Toxic metabolic encephalopathy Likely being driven by uremia w/ BUN 130 in setting of ESRD and UGIB -peritoneal dialysis per Nephrology - mental status improved at time of d/c   ESRD on peritoneal dialysis Nephrology following - to continue home peritoneal dialysis per Nephrology   HTN More consistently controlled with resumption of oral medications - resume usual home meds at time of d/c   Chronic systolic CHF No acute exacerbation during this admission - volume control per dialysis    Allergies as of 06/22/2020      Reactions   Ivp Dye [iodinated Diagnostic Agents]    ESRD patient       Medication List    STOP  taking these medications   azithromycin 250 MG tablet Commonly known as: ZITHROMAX   predniSONE 10 MG tablet Commonly known as: DELTASONE     TAKE these medications   acetaminophen 500 MG tablet Commonly known as: TYLENOL Take 500-1,000 mg by mouth every 8 (eight) hours as needed for mild pain or moderate pain.   apixaban 2.5 MG Tabs tablet Commonly known as: Eliquis Take 1 tablet (2.5 mg total) by mouth 2 (two) times daily. Start taking on: June 29, 2020 What changed: These instructions start on June 29, 2020. If you are unsure what to do until then, ask your doctor or other care provider.   atorvastatin 40 MG tablet Commonly known as: LIPITOR Take 1 tablet (40 mg total) by mouth daily.   calcium carbonate 500 MG chewable tablet Commonly known as: TUMS - dosed in mg elemental calcium Chew 1 tablet by mouth 3 (three) times daily as needed for heartburn.   carvedilol 6.25 MG tablet Commonly known as: COREG Take 1 tablet (6.25 mg total) by mouth 2 (two) times daily with a meal.   gabapentin 300 MG capsule Commonly known as: NEURONTIN Take 300 mg by mouth at bedtime.   hydrALAZINE 50 MG tablet Commonly known as: APRESOLINE Take 2 tablets (100 mg total) by mouth 3 (three) times daily.   irbesartan 150 MG tablet Commonly known as: AVAPRO Take 150 mg by mouth every evening.   multivitamin Tabs tablet Take 1 tablet by mouth at bedtime.   ondansetron 4 MG tablet Commonly known as: Zofran Take 1 tablet (4 mg total) by mouth daily as needed for nausea or vomiting.   pantoprazole 40 MG tablet Commonly known as: Protonix Take 1 tablet 2 times a day for 30 days, then change to 1 tablet a day   torsemide 20 MG tablet Commonly known as: DEMADEX Take 2 tablets (40 mg total) by mouth daily. What changed: when to take this       Day of Discharge BP (!) 129/52 (BP Location: Right Arm)   Pulse 72   Temp 98.3 F (36.8 C)   Resp 14   Ht '5\' 10"'$  (1.778 m)   Wt 55.2 kg   SpO2 96%   BMI 17.48 kg/m   Physical Exam: General: No acute respiratory distress Lungs: Clear to auscultation bilaterally without wheezes or crackles Cardiovascular: Regular rate and rhythm without murmur gallop or rub normal S1 and S2 Abdomen: Nontender, nondistended, soft, bowel sounds positive, no rebound, no ascites, no appreciable mass Extremities: No significant cyanosis, clubbing, or edema bilateral lower extremities  Basic Metabolic  Panel: Recent Labs  Lab 06/18/20 1736 06/20/20 0521 06/21/20 0416 06/22/20 0510  NA 140 143 139 140  K 4.5 5.6* 4.8 4.3  CL 96* 102 98 99  CO2 22 19* 19* 19*  GLUCOSE 142* 92 116* 158*  BUN 92* 130* 128* 122*  CREATININE 13.44* 15.64* 15.49* 15.67*  CALCIUM 8.2* 7.6* 7.6* 7.6*  PHOS  --   --  9.5*  --     Liver Function Tests: Recent Labs  Lab 06/18/20 1736 06/20/20 0521 06/21/20 0416 06/22/20 0510  AST 35 33  --  31  ALT 13 11  --  9  ALKPHOS 39 43  --  42  BILITOT 0.9 0.8  --  0.8  PROT 6.4* 6.0*  --  5.7*  ALBUMIN 2.9* 2.8* 2.6* 2.5*    CBC: Recent Labs  Lab 06/19/20 1258 06/19/20 2015 06/20/20 0521 06/21/20 0416 06/22/20 0510  WBC 8.2 8.7 9.4 7.4 7.9  HGB 6.2* 8.7* 8.2* 8.0* 8.0*  HCT 19.6* 26.9* 24.7* 23.7* 23.3*  MCV 104.3* 100.4* 99.6 97.9 96.7  PLT 89* 98* 89* 89* 93*    BNP (last 3 results) Recent Labs    09/29/19 0642 11/18/19 2118 05/06/20 0214  BNP 1,195.0* >4,500.0* >4,500.0*    Time spent in discharge (includes decision making & examination of pt): 35 minutes  06/22/2020, 2:23 PM   Cherene Altes, MD [facility] Office  [phone]

## 2020-06-22 NOTE — Plan of Care (Signed)

## 2020-06-22 NOTE — Progress Notes (Signed)
GI Inpatient Follow-up Note  Subjective:  Some dark stool overnight but hemoglobin stable. Very weak.  Scheduled Inpatient Medications:  . atorvastatin  40 mg Oral Daily  . carvedilol  6.25 mg Oral BID WC  . Chlorhexidine Gluconate Cloth  6 each Topical Daily  . gentamicin cream  1 application Topical Daily  . guaiFENesin  600 mg Oral BID  . hydrALAZINE  100 mg Oral TID  . irbesartan  150 mg Oral QPM  . multivitamin  1 tablet Oral QHS  . pantoprazole  40 mg Intravenous Q12H  . polyethylene glycol  17 g Oral Daily    Continuous Inpatient Infusions:   . dialysis solution 1.5% low-MG/low-CA      PRN Inpatient Medications:  acetaminophen, acetaminophen, calcium carbonate, guaiFENesin-dextromethorphan  Review of Systems:  Review of Systems  Constitutional: Positive for malaise/fatigue. Negative for chills and fever.  Respiratory: Negative for cough.   Cardiovascular: Negative for chest pain.  Gastrointestinal: Negative for blood in stool, constipation, diarrhea, nausea and vomiting.  Skin: Negative for rash.  Neurological: Positive for weakness.  Psychiatric/Behavioral: Negative for substance abuse.  All other systems reviewed and are negative.    Physical Examination: BP (!) 129/52 (BP Location: Right Arm)   Pulse 72   Temp 98.3 F (36.8 C)   Resp 14   Ht '5\' 10"'$  (1.778 m)   Wt 55.2 kg   SpO2 96%   BMI 17.48 kg/m  Gen: Alert, up in bed eating. HEENT: PEERLA Neck: supple Chest: No respiratory distress Abd: soft, NT, ND Ext: no edema Skin: no rash or lesions noted Lymph: no LAD  Data: Lab Results  Component Value Date   WBC 7.9 06/22/2020   HGB 8.0 (L) 06/22/2020   HCT 23.3 (L) 06/22/2020   MCV 96.7 06/22/2020   PLT 93 (L) 06/22/2020   Recent Labs  Lab 06/20/20 0521 06/21/20 0416 06/22/20 0510  HGB 8.2* 8.0* 8.0*   Lab Results  Component Value Date   NA 140 06/22/2020   K 4.3 06/22/2020   CL 99 06/22/2020   CO2 19 (L) 06/22/2020   BUN 122 (H)  06/22/2020   CREATININE 15.67 (H) 06/22/2020   Lab Results  Component Value Date   ALT 9 06/22/2020   AST 31 06/22/2020   ALKPHOS 42 06/22/2020   BILITOT 0.8 06/22/2020   No results for input(s): APTT, INR, PTT in the last 168 hours. Assessment/Plan: Mr. Sparr is a 74 y.o. gentleman with history of ESRD and now COVID who presented with dark stool and drop in hemoglobin. Given symptoms and no further bowel movements, suspect COVID related gastroenteritis which can present without respiratory symptoms. Improving with continued PD dialysis.  Recommendations:  - continue supportive care - maintain active type and screen and transfuse if hemoglobin < 7 - can switch to PO PPI BID - monitor for any GI bleeding  - advance diet as tolerated - anticipate hemoglobin to bounce around 8 - BUN still really high which can cause platelet dysfunction and spontaneous bleeding. Nephrology following, appreciate recs  Will follow peripherally. Please call with any questions or concerns   Raylene Miyamoto MD, MPH [place]

## 2020-06-22 NOTE — Progress Notes (Signed)
[facility]  ROUNDING NOTE   Subjective:   Mr. Sencere Vanname was admitted to [facility] on 06/18/2020 for Acute GI bleeding [K92.2] Generalized weakness [R53.1] Gastrointestinal hemorrhage, unspecified gastrointestinal hemorrhage type [K92.2] COVID-19 [U07.1] COVID-19+  Patient has been doing CAPD (manuals) at home. He was in the middle of peritoneal dialysis training when his daughter developed COVID-19 infection. Patient started feeling sick. Due to the COVID diagnosis, patient was asked to do manuals at home until he is off quarantine. However he started having dark tarry stools for a few days. He has been on apixaban for RIJ catheter thrombus.  Family denies use of NSAIDs.    Patient's daughter is in the room with him.  States that he was able to walk to the bathroom and did okay.  Also ate [place] sandwich yesterday and cream of wheat this morning.  Then he felt nauseous. Family felt comfortable with patient to discharge to home However, later in the day, he developed syncope therefore discharge was canceled  Objective:  Vital signs in last 24 hours:  Temp:  [98.3 F (36.8 C)-99.1 F (37.3 C)] 98.5 F (36.9 C) (01/31 1507) Pulse Rate:  [59-75] 66 (01/31 1507) Resp:  [12-19] 19 (01/31 1507) BP: (108-161)/(52-91) 127/63 (01/31 1507) SpO2:  [96 %-100 %] 100 % (01/31 1507) Weight:  [55.2 kg] 55.2 kg (01/31 0458)  Weight change:  Filed Weights   06/18/20 1550 06/20/20 0519 06/22/20 0458  Weight: 52 kg 55.1 kg 55.2 kg    Intake/Output: I/O last 3 completed shifts: In: [id number] [P.O.:720; I.V.:905] Out: 250 [Urine:250]   Intake/Output this shift:  Total I/O In: 65.2 [I.V.:65.2] Out: -   Physical Exam: General: NAD, laying in bed, appears weak, frail  Head: Normocephalic, atraumatic. Moist oral mucosal membranes  Lungs:  Clear to auscultation  Heart: Regular rate and rhythm  Abdomen:  Soft, nontender, +sutures  Extremities:  no peripheral edema.  Neurologic:  Nonfocal, moving all four extremities  Skin: No lesions  Access: RIJ permcath, PD catheter    Basic Metabolic Panel: Recent Labs  Lab 06/18/20 1736 06/20/20 0521 06/21/20 0416 06/22/20 0510  NA 140 143 139 140  K 4.5 5.6* 4.8 4.3  CL 96* 102 98 99  CO2 22 19* 19* 19*  GLUCOSE 142* 92 116* 158*  BUN 92* 130* 128* 122*  CREATININE 13.44* 15.64* 15.49* 15.67*  CALCIUM 8.2* 7.6* 7.6* 7.6*  PHOS  --   --  9.5*  --     Liver Function Tests: Recent Labs  Lab 06/18/20 1736 06/20/20 0521 06/21/20 0416 06/22/20 0510  AST 35 33  --  31  ALT 13 11  --  9  ALKPHOS 39 43  --  42  BILITOT 0.9 0.8  --  0.8  PROT 6.4* 6.0*  --  5.7*  ALBUMIN 2.9* 2.8* 2.6* 2.5*   No results for input(s): LIPASE, AMYLASE in the last 168 hours. No results for input(s): AMMONIA in the last 168 hours.  CBC: Recent Labs  Lab 06/19/20 1258 06/19/20 2015 06/20/20 0521 06/21/20 0416 06/22/20 0510  WBC 8.2 8.7 9.4 7.4 7.9  HGB 6.2* 8.7* 8.2* 8.0* 8.0*  HCT 19.6* 26.9* 24.7* 23.7* 23.3*  MCV 104.3* 100.4* 99.6 97.9 96.7  PLT 89* 98* 89* 89* 93*    Cardiac Enzymes: No results for input(s): CKTOTAL, CKMB, CKMBINDEX, TROPONINI in the last 168 hours.  BNP: Invalid input(s): POCBNP  CBG: No results for input(s): GLUCAP in the last 168 hours.  Microbiology: Results  for orders placed or performed during the hospital encounter of 05/06/20  Resp Panel by RT-PCR (Flu A&B, Covid) Nasopharyngeal Swab     Status: None   Collection Time: 05/06/20  2:14 AM   Specimen: Nasopharyngeal Swab; Nasopharyngeal(NP) swabs in vial transport medium  Result Value Ref Range Status   SARS Coronavirus 2 by RT PCR NEGATIVE NEGATIVE Final    Comment: (NOTE) SARS-CoV-2 target nucleic acids are NOT DETECTED.  The SARS-CoV-2 RNA is generally detectable in upper respiratory specimens during the acute phase of infection. The lowest concentration of SARS-CoV-2 viral copies this assay can detect is 138 copies/mL. A  negative result does not preclude SARS-Cov-2 infection and should not be used as the sole basis for treatment or other patient management decisions. A negative result may occur with  improper specimen collection/handling, submission of specimen other than nasopharyngeal swab, presence of viral mutation(s) within the areas targeted by this assay, and inadequate number of viral copies(<138 copies/mL). A negative result must be combined with clinical observations, patient history, and epidemiological information. The expected result is Negative.  Fact Sheet for Patients:  EntrepreneurPulse.com.au  Fact Sheet for Healthcare Providers:  IncredibleEmployment.be  This test is no t yet approved or cleared by the [place] FDA and  has been authorized for detection and/or diagnosis of SARS-CoV-2 by FDA under an Emergency Use Authorization (EUA). This EUA will remain  in effect (meaning this test can be used) for the duration of the COVID-19 declaration under Section 564(b)(1) of the Act, 21 U.S.C.section 360bbb-3(b)(1), unless the authorization is terminated  or revoked sooner.       Influenza A by PCR NEGATIVE NEGATIVE Final   Influenza B by PCR NEGATIVE NEGATIVE Final    Comment: (NOTE) The Xpert Xpress SARS-CoV-2/FLU/RSV plus assay is intended as an aid in the diagnosis of influenza from Nasopharyngeal swab specimens and should not be used as a sole basis for treatment. Nasal washings and aspirates are unacceptable for Xpert Xpress SARS-CoV-2/FLU/RSV testing.  Fact Sheet for Patients: EntrepreneurPulse.com.au  Fact Sheet for Healthcare Providers: IncredibleEmployment.be  This test is not yet approved or cleared by the [place] FDA and has been authorized for detection and/or diagnosis of SARS-CoV-2 by FDA under an Emergency Use Authorization (EUA). This EUA will remain in effect (meaning this test can  be used) for the duration of the COVID-19 declaration under Section 564(b)(1) of the Act, 21 U.S.C. section 360bbb-3(b)(1), unless the authorization is terminated or revoked.  Performed at [facility], [address] [place], [place] [address]     Coagulation Studies: No results for input(s): LABPROT, INR in the last 72 hours.  Urinalysis: No results for input(s): COLORURINE, LABSPEC, PHURINE, GLUCOSEU, HGBUR, BILIRUBINUR, KETONESUR, PROTEINUR, UROBILINOGEN, NITRITE, LEUKOCYTESUR in the last 72 hours.  Invalid input(s): APPERANCEUR    Imaging: No results found.   Medications:   . dialysis solution 1.5% low-MG/low-CA     . atorvastatin  40 mg Oral Daily  . carvedilol  6.25 mg Oral BID WC  . Chlorhexidine Gluconate Cloth  6 each Topical Daily  . epoetin (EPOGEN/PROCRIT) injection  20,000 Units Subcutaneous Weekly  . gentamicin cream  1 application Topical Daily  . guaiFENesin  600 mg Oral BID  . hydrALAZINE  100 mg Oral TID  . irbesartan  150 mg Oral QPM  . multivitamin  1 tablet Oral QHS  . pantoprazole  40 mg Intravenous Q12H  . polyethylene glycol  17 g Oral Daily   acetaminophen, acetaminophen, calcium carbonate,  guaiFENesin-dextromethorphan, loperamide  Assessment/ Plan:  Mr. Gariel Glastetter is a 75 y.o. [place] ([place])  male with end stage renal disease on peritoneal dialysis, hypertension, AAA repair, congestive heart failure, peripheral vascular disease, raynaud's syndrome, hyperlipidemia who is admitted to [facility] on 06/18/2020 for Acute GI bleeding [K92.2] Generalized weakness [R53.1] Gastrointestinal hemorrhage, unspecified gastrointestinal hemorrhage type [K92.2] COVID-19 [U07.1]  [facility] 52.5kg CCPD 9 hours 5 exchanges 2074m fills  1. End Stage Renal Disease: on peritoneal dialysis. Has no completed training and doing CAPD at home Currently. Will place on CCPD with cycler tonight. 9 hours five exchanges with 2000 fills. This  will be his outpatient prescription.  - Consult vascular to remove permcath and then patient will be able to come off apixaban  2. Anemia with chronic kidney disease and acute blood loss anemia from GI bleed.  Lab Results  Component Value Date   HGB 8.0 (L) 06/22/2020    - Appreciate GI input - Low threshold for transfusion - PPI  3. Hypertension:Home regimen of irbesartan, hydralazine, and carvedilol.  -Blood pressure low normal Use 1.5% dialysate bags Torsemide on hold at present  4. Secondary Hyperparathyroidism: with hyperphosphatemia. PTH 390 on 06/04/20 as outpatient.  - calcium carbonate with meals.    LOS: 4[place]1/31/20223:45 PM

## 2020-06-22 NOTE — Discharge Instructions (Signed)
Date of Positive COVID Test: 06/18/20  Date Isolation Ends: 06/24/20 (i.e. 06/23/20 is last full day of isolation)   COVID-19: What to Do if You Are Sick  If you have a fever, cough or other symptoms, you might have COVID-19. Most people have mild illness and are able to recover at home. If you are sick:  Keep track of your symptoms.  If you have an emergency warning sign (including trouble breathing), call 911. Steps to help prevent the spread of COVID-19 if you are sick If you are sick with COVID-19 or think you might have COVID-19, follow the steps below to care for yourself and to help protect other people in your home and community. Stay home except to get medical care  Stay home. Most people with COVID-19 have mild illness and can recover at home without medical care. Do not leave your home, except to get medical care. Do not visit public areas.  Take care of yourself. Get rest and stay hydrated. Take over-the-counter medicines, such as acetaminophen, to help you feel better.  Stay in touch with your doctor. Call before you get medical care. Be sure to get care if you have trouble breathing, or have any other emergency warning signs, or if you think it is an emergency.  Avoid public transportation, ride-sharing, or taxis. Separate yourself from other people As much as possible, stay in a specific room and away from other people and pets in your home. If possible, you should use a separate bathroom. If you need to be around other people or animals in or outside of the home, wear a mask. Tell your close contactsthat they may have been exposed to COVID-19. An infected person can spread COVID-19 starting 48 hours (or 2 days) before the person has any symptoms or tests positive. By letting your close contacts know they may have been exposed to COVID-19, you are helping to protect everyone.  Additional guidance is available for those living in close quarters and shared housing.  See  COVID-19 and Animals if you have questions about pets.  If you are diagnosed with COVID-19, someone from the health department may call you. Answer the call to slow the spread. Monitor your symptoms  Symptoms of COVID-19 include fever, cough, or other symptoms.  Follow care instructions from your healthcare provider and local health department. Your local health authorities may give instructions on checking your symptoms and reporting information. When to seek emergency medical attention Look for emergency warning signs* for COVID-19. If someone is showing any of these signs, seek emergency medical care immediately:  Trouble breathing  Persistent pain or pressure in the chest  New confusion  Inability to wake or stay awake  Pale, gray, or blue-colored skin, lips, or nail beds, depending on skin tone *This list is not all possible symptoms. Please call your medical provider for any other symptoms that are severe or concerning to you. Call 911 or call ahead to your local emergency facility: Notify the operator that you are seeking care for someone who has or may have COVID-19. Call ahead before visiting your doctor  Call ahead. Many medical visits for routine care are being postponed or done by phone or telemedicine.  If you have a medical appointment that cannot be postponed, call your doctor's office, and tell them you have or may have COVID-19. This will help the office protect themselves and other patients. Get  tested  If you have symptoms of COVID-19, get tested. While waiting for test  results, you stay away from others, including staying apart from those living in your household.  You can visit your state, tribal, local, and territorialhealth department's website to look for the latest local information on testing sites. If you are sick, wear a mask over your nose and mouth  You should wear a mask over your nose and mouth if you must be around other people or animals,  including pets (even at home).  You don't need to wear the mask if you are alone. If you can't put on a mask (because of trouble breathing, for example), cover your coughs and sneezes in some other way. Try to stay at least 6 feet away from other people. This will help protect the people around you.  Masks should not be placed on young children under age 68 years, anyone who has trouble breathing, or anyone who is not able to remove the mask without help. Note: During the COVID-19 pandemic, medical grade facemasks are reserved for healthcare workers and some first responders. Cover your coughs and sneezes  Cover your mouth and nose with a tissue when you cough or sneeze.  Throw away used tissues in a lined trash can.  Immediately wash your hands with soap and water for at least 20 seconds. If soap and water are not available, clean your hands with an alcohol-based hand sanitizer that contains at least 60% alcohol. Clean your hands often  Wash your hands often with soap and water for at least 20 seconds. This is especially important after blowing your nose, coughing, or sneezing; going to the bathroom; and before eating or preparing food.  Use hand sanitizer if soap and water are not available. Use an alcohol-based hand sanitizer with at least 60% alcohol, covering all surfaces of your hands and rubbing them together until they feel dry.  Soap and water are the best option, especially if hands are visibly dirty.  Avoid touching your eyes, nose, and mouth with unwashed hands.  Handwashing Tips Avoid sharing personal household items  Do not share dishes, drinking glasses, cups, eating utensils, towels, or bedding with other people in your home.  Wash these items thoroughly after using them with soap and water or put in the dishwasher. Clean all "high-touch" surfaces everyday  Clean and disinfect high-touch surfaces in your "sick room" and bathroom; wear disposable gloves. Let someone else  clean and disinfect surfaces in common areas, but you should clean your bedroom and bathroom, if possible.  If a caregiver or other person needs to clean and disinfect a sick person's bedroom or bathroom, they should do so on an as-needed basis. The caregiver/other person should wear a mask and disposable gloves prior to cleaning. They should wait as long as possible after the person who is sick has used the bathroom before coming in to clean and use the bathroom. ? High-touch surfaces include phones, remote controls, counters, tabletops, doorknobs, bathroom fixtures, toilets, keyboards, tablets, and bedside tables.  Clean and disinfect areas that may have blood, stool, or body fluids on them.  Use household cleaners and disinfectants. Clean the area or item with soap and water or another detergent if it is dirty. Then, use a household disinfectant. ? Be sure to follow the instructions on the label to ensure safe and effective use of the product. Many products recommend keeping the surface wet for several minutes to ensure germs are killed. Many also recommend precautions such as wearing gloves and making sure you have good ventilation during use of  the product. ? Use a product from [employer]: Disinfectants for Coronavirus ([id number]). ? Complete Disinfection Guidance When you can be around others after being sick with COVID-19 Deciding when you can be around others is different for different situations. Find out when you can safely end home isolation. For any additional questions about your care, contact your healthcare provider or state or local health department. 08/07/2019 Content source: [facility] Immun[facility] ([facility]), Division of Viral Diseases This information is not intended to replace advice given to you by your health care provider. Make sure you discuss any questions you have with your health care provider. Document Revised: 03/23/2020 Document  Reviewed: 03/23/2020 Elsevier Patient Education  2021 [employer].   Gastrointestinal Bleeding Gastrointestinal (GI) bleeding is bleeding somewhere along the digestive tract, between the mouth and the anus. This tract includes the mouth, esophagus, stomach, small intestine, large intestine, and anus. The large intestine is often called the colon. GI bleeding can be caused by various problems. The severity of these problems can range from mild to serious or even life-threatening. If you have GI bleeding, you may find blood in your stools (feces), you may have black stools, or you may vomit blood. If there is a lot of bleeding, you may need to stay in the hospital. What are the causes? This condition may be caused by:  Inflammation, irritation, or swelling of the esophagus (esophagitis). The esophagus is part of the body that moves food from your mouth to your stomach.  Swollen veins in the rectum (hemorrhoids).  Areas of painful tearing in the anus that are often caused by passing hard stool (anal fissures).  Pouches that form on the colon over time, with age, and may bleed a lot (diverticulosis).  Inflammation (diverticulitis) in areas with diverticulosis. This can cause pain, fever, and bloody stools, although bleeding may be mild.  Growths (polyps) or cancer. Colon cancer often starts out as precancerous polyps.  Gastritis and ulcers. With these, bleeding may come from the upper GI tract, near the stomach. What increases the risk? You are more likely to develop this condition if you:  Have an infection in your stomach from a type of bacteria called Helicobacter pylori.  Take certain medicines, such as: ? NSAIDs. ? Aspirin. ? Selective serotonin reuptake inhibitors (SSRIs). ? Steroids. ? Antiplatelet or anticoagulant medicines.  Smoke.  Drink alcohol. What are the signs or symptoms? Common symptoms of this condition include:  Bright red blood in your vomit, or vomit that  looks like coffee grounds.  Bloody, black, or tarry stools. ? Bleeding from the lower GI tract will usually cause red or maroon blood in the stools. ? Bleeding from the upper GI tract may cause black, tarry stools that are often stronger smelling than usual. ? In certain cases, if the bleeding is fast enough, the stools may be red.  Pain or cramping in the abdomen. How is this diagnosed? This condition may be diagnosed based on:  Your medical history and a physical exam.  Various tests, such as: ? Blood tests. ? Stool tests. ? X-rays and other imaging tests. ? Esophagogastroduodenoscopy (EGD). In this test, a flexible, lighted tube is used to look at your esophagus, stomach, and small intestine. ? Colonoscopy. In this test, a flexible, lighted tube is used to look at your colon. How is this treated? Treatment for this condition depends on the cause of the bleeding. For example:  For bleeding from the esophagus, stomach, small intestine,  or colon, the health care provider doing your EGD or colonoscopy may be able to stop the bleeding as part of the procedure.  Inflammation or infection of the colon can be treated with medicines.  Certain rectal problems can be treated with creams, suppositories, or warm baths.  Medicines may be given to reduce acid in your stomach.  Surgery is sometimes needed.  Blood transfusions are sometimes needed if a lot of blood has been lost. If bleeding is mild, you may be allowed to go home. If there is a lot of bleeding, you will need to stay in the hospital for observation. Follow these instructions at home:  Take over-the-counter and prescription medicines only as told by your health care provider.  Eat foods that are high in fiber, such as beans, whole grains, and fresh fruits and vegetables. This will help to keep your stools soft. Eating 1-3 prunes each day works well for many people.  Drink enough fluid to keep your urine pale yellow.  Keep  all follow-up visits as told by your health care provider. This is important.   Contact a health care provider if:  Your symptoms do not improve. Get help right away if:  Your bleeding does not stop.  You feel light-headed or you faint.  You feel weak.  You have severe cramps in your back or abdomen.  You pass large blood clots in your stool.  Your symptoms are getting worse.  You have chest pain or fast heartbeats. Summary  Gastrointestinal (GI) bleeding is bleeding somewhere along the digestive tract, between the mouth and anus. GI bleeding can be caused by various problems. The severity of these problems can range from mild to serious or even life-threatening.  Treatment for this condition depends on the cause of the bleeding.  Take over-the-counter and prescription medicines only as told by your health care provider.  Keep all follow-up visits as told by your health care provider. This is important.  Get help right away if your bleeding increases, your symptoms are getting worse, or you have new symptoms. This information is not intended to replace advice given to you by your health care provider. Make sure you discuss any questions you have with your health care provider. Document Revised: 12/20/2017 Document Reviewed: 12/20/2017 Elsevier Patient Education  [place].

## 2020-06-23 ENCOUNTER — Inpatient Hospital Stay: Payer: Medicare Other

## 2020-06-23 ENCOUNTER — Other Ambulatory Visit (INDEPENDENT_AMBULATORY_CARE_PROVIDER_SITE_OTHER): Payer: Self-pay | Admitting: Vascular Surgery

## 2020-06-23 ENCOUNTER — Encounter
Admission: EM | Disposition: A | Discharge status: Home Health | Payer: Self-pay | Source: Home / Self Care | Attending: Internal Medicine

## 2020-06-23 DIAGNOSIS — N185 Chronic kidney disease, stage 5: Secondary | ICD-10-CM

## 2020-06-23 DIAGNOSIS — K922 Gastrointestinal hemorrhage, unspecified: Secondary | ICD-10-CM | POA: Diagnosis not present

## 2020-06-23 HISTORY — PX: DIALYSIS/PERMA CATHETER INSERTION: CATH118288

## 2020-06-23 LAB — RENAL FUNCTION PANEL
Albumin: 2.9 g/dL — ABNORMAL LOW (ref 3.5–5.0)
Anion gap: 24 — ABNORMAL HIGH (ref 5–15)
BUN: 116 mg/dL — ABNORMAL HIGH (ref 8–23)
CO2: 22 mmol/L (ref 22–32)
Calcium: 7.7 mg/dL — ABNORMAL LOW (ref 8.9–10.3)
Chloride: 97 mmol/L — ABNORMAL LOW (ref 98–111)
Creatinine, Ser: 15.88 mg/dL — ABNORMAL HIGH (ref 0.61–1.24)
GFR, Estimated: 3 mL/min — ABNORMAL LOW (ref >60)
Glucose, Bld: 132 mg/dL — ABNORMAL HIGH (ref 70–99)
Phosphorus: 9.1 mg/dL — ABNORMAL HIGH (ref 2.5–4.6)
Potassium: 4.5 mmol/L (ref 3.5–5.1)
Sodium: 143 mmol/L (ref 135–145)

## 2020-06-23 LAB — CBC
HCT: 23.9 % — ABNORMAL LOW (ref 39.0–52.0)
Hemoglobin: 8.1 g/dL — ABNORMAL LOW (ref 13.0–17.0)
MCH: 32.9 pg (ref 26.0–34.0)
MCHC: 33.9 g/dL (ref 30.0–36.0)
MCV: 97.2 fL (ref 80.0–100.0)
Platelets: 109 10*3/uL — ABNORMAL LOW (ref 150–400)
RBC: 2.46 MIL/uL — ABNORMAL LOW (ref 4.22–5.81)
RDW: 15.8 % — ABNORMAL HIGH (ref 11.5–15.5)
WBC: 9.2 10*3/uL (ref 4.0–10.5)
nRBC: 0 % (ref 0.0–0.2)

## 2020-06-23 LAB — GLUCOSE, CAPILLARY: Glucose-Capillary: 111 mg/dL — ABNORMAL HIGH (ref 70–99)

## 2020-06-23 SURGERY — DIALYSIS/PERMA CATHETER INSERTION
Anesthesia: Moderate Sedation

## 2020-06-23 MED ORDER — MIDAZOLAM HCL 2 MG/ML PO SYRP: 8.0000 mg | ORAL_SOLUTION | Freq: Once | ORAL | Status: DC | PRN

## 2020-06-23 MED ORDER — MIDAZOLAM HCL 2 MG/2ML IJ SOLN
INTRAMUSCULAR | Status: DC | PRN
  Administered 2020-06-23: 0.5 mg via INTRAVENOUS

## 2020-06-23 MED ORDER — MIDAZOLAM HCL 5 MG/5ML IJ SOLN
INTRAMUSCULAR | Status: AC
  Filled 2020-06-23: qty 5

## 2020-06-23 MED ORDER — ONDANSETRON HCL 4 MG/2ML IJ SOLN
4.0000 mg | Freq: Four times a day (QID) | INTRAMUSCULAR | Status: DC | PRN
  Administered 2020-06-25 – 2020-06-27 (×5): 4 mg via INTRAVENOUS

## 2020-06-23 MED ORDER — SODIUM CHLORIDE 0.9 % IV SOLN: INTRAVENOUS | Status: DC

## 2020-06-23 MED ORDER — FENTANYL CITRATE (PF) 100 MCG/2ML IJ SOLN
INTRAMUSCULAR | Status: AC
  Filled 2020-06-23: qty 2

## 2020-06-23 MED ORDER — FAMOTIDINE 20 MG PO TABS: 40.0000 mg | ORAL_TABLET | Freq: Once | ORAL | Status: DC | PRN

## 2020-06-23 MED ORDER — ALBUMIN HUMAN 25 % IV SOLN
25.0000 g | Freq: Once | INTRAVENOUS | Status: AC
  Administered 2020-06-23: 12.5 g via INTRAVENOUS
  Filled 2020-06-23: qty 100

## 2020-06-23 MED ORDER — METHYLPREDNISOLONE SODIUM SUCC 125 MG IJ SOLR
INTRAMUSCULAR | Status: AC
  Filled 2020-06-23: qty 2

## 2020-06-23 MED ORDER — METHYLPREDNISOLONE SODIUM SUCC 125 MG IJ SOLR: 125.0000 mg | Freq: Once | INTRAMUSCULAR | Status: DC | PRN

## 2020-06-23 MED ORDER — HYDROMORPHONE HCL 1 MG/ML IJ SOLN: 1.0000 mg | Freq: Once | INTRAMUSCULAR | Status: DC | PRN

## 2020-06-23 MED ORDER — DIPHENHYDRAMINE HCL 50 MG/ML IJ SOLN
INTRAMUSCULAR | Status: AC
  Filled 2020-06-23: qty 1

## 2020-06-23 MED ORDER — CEFAZOLIN SODIUM-DEXTROSE 1-4 GM/50ML-% IV SOLN
INTRAVENOUS | Status: AC
  Administered 2020-06-23: 1 g via INTRAVENOUS
  Filled 2020-06-23: qty 50

## 2020-06-23 MED ORDER — FAMOTIDINE 20 MG PO TABS
ORAL_TABLET | ORAL | Status: AC
  Filled 2020-06-23: qty 2

## 2020-06-23 MED ORDER — CEFAZOLIN SODIUM-DEXTROSE 1-4 GM/50ML-% IV SOLN
1.0000 g | Freq: Once | INTRAVENOUS | Status: AC
  Filled 2020-06-23: qty 50

## 2020-06-23 MED ORDER — DIPHENHYDRAMINE HCL 50 MG/ML IJ SOLN: 50.0000 mg | Freq: Once | INTRAMUSCULAR | Status: DC | PRN

## 2020-06-23 SURGICAL SUPPLY — 9 items
ADH SKN CLS APL DERMABOND .7 (GAUZE/BANDAGES/DRESSINGS) ×1
CATH CANNON HEMO 15FR 23CM (HEMODIALYSIS SUPPLIES) ×2 IMPLANT
DERMABOND ADVANCED (GAUZE/BANDAGES/DRESSINGS) ×1
DERMABOND ADVANCED .7 DNX12 (GAUZE/BANDAGES/DRESSINGS) ×1 IMPLANT
PACK ANGIOGRAPHY (CUSTOM PROCEDURE TRAY) ×2 IMPLANT
SUT MNCRL 4-0 (SUTURE) ×2
SUT MNCRL 4-0 27XMFL (SUTURE) ×1
SUT PROLENE 0 CT 1 30 (SUTURE) ×2 IMPLANT
SUTURE MNCRL 4-0 27XMF (SUTURE) ×1 IMPLANT

## 2020-06-23 NOTE — Progress Notes (Signed)
Scott Oconnor  [id number] DOB: Mar 11, 1946 DOA: 06/18/2020 PCP: [name], MD    Brief Narrative:  75yo with a history of ESRD recently transitioned to PD, anemia of CKD, HTN, AAA repair with endovascular stent, chronic systolic CHF, PVD, and 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 In the ED he was found to have acute worsening of his anemia with hemoglobin of 7.3 from baseline of 10.5. He was also found to be Covid positive though CXR noted no infiltrate.  Significant Events:  1/27 admit via ED  Date of Positive COVID Test:  1/27  Vaccination Status: Vaccinated x2 + boosted   COVID-19 specific Treatment: Remdesivir for 3 day course refused by family   Antimicrobials:  None  DVT prophylaxis: SCDs  Subjective: Stat CT head was accomplished this morning for "mental status changes" and was without acute findings.  BUN remains severely elevated. Mental status has been waxing and waning during hospital stay. At the time of my evaluation today the patient will open his eyes somewhat but otherwise does not engage the examiner. There is no focal neurologic deficit appreciable. He does not appear uncomfortable.  Assessment & Plan:  Acute blood loss anemia on anemia of CKD Baseline hemoglobin approximately 10.5 - GIB is etiology - transfuse as needed when hemoglobin drops below 7.0 - had a favorable response to 1U PRBC 1/28 - hemoglobin holding steady suggesting bleeding may have stopped  Recent Labs  Lab 06/19/20 2015 06/20/20 0521 06/21/20 0416 06/22/20 0510 06/23/20 0559  HGB 8.7* 8.2* 8.0* 8.0* 8.1*    UGIB - Melena  On eliquis prior to admission for clot associated with hemodialysis catheter which has subsequently been removed - GI following - cont protonix - transfuse as needed - does not appear he will require  urgent endoscopy -hemoglobin trend suggests bleeding has stopped  CoViD+ - Incidental / Asymptomatic +/- gastroenteritis  No signif pulmon sx at present - given his high risk status I advised 3 day infusion of Remdesivir as per the "PINETREE" trial protocol - the pt deferred to his daughter who does not wish to follow this recommendation at this time   Toxic metabolic encephalopathy Likely being driven by uremia w/ BUN 130 in setting of ESRD and UGIB -peritoneal dialysis per Nephrology   ESRD on peritoneal dialysis Nephrology following  HTN Blood pressure presently well controlled  Chronic systolic CHF No acute exacerbation at present - volume control per dialysis   Code Status: FULL CODE Family Communication: spoke w/ daughter at bedside  Status is: Inpatient  Remains inpatient appropriate because:Inpatient level of care appropriate due to severity of illness   Dispo: The patient is from: Home              Anticipated d/c is to: Home              Anticipated d/c date is: 2 days              Patient currently is not medically stable to d/c.   Difficult to place patient No  Consultants:  Nephrology GI   Objective: Blood pressure 127/64, pulse 77, temperature (!) 97.4 F (36.3 C), temperature source Oral, resp. rate 18, height '5\' 10"'$  (1.778 m), weight 55.2 kg, SpO2 100 %.  Intake/Output Summary (Last 24 hours) at 06/23/2020 1601 Last data filed at 06/23/2020 0300 Gross per  24 hour  Intake 60.06 ml  Output -  Net 60.06 ml   Filed Weights   06/18/20 1550 06/20/20 0519 06/22/20 0458  Weight: 52 kg 55.1 kg 55.2 kg    Examination: General: No acute respiratory distress -somnolent Lungs: CTA B - no wheezing  Cardiovascular: RRR-no murmur Abdomen: NT/ND, soft, bowel sounds positive, no rebound appreciable mass Extremities: No edema B LE   CBC: Recent Labs  Lab 06/21/20 0416 06/22/20 0510 06/23/20 0559  WBC 7.4 7.9 9.2  HGB 8.0* 8.0* 8.1*  HCT 23.7* 23.3* 23.9*   MCV 97.9 96.7 97.2  PLT 89* 93* 0000000*   Basic Metabolic Panel: Recent Labs  Lab 06/21/20 0416 06/22/20 0510 06/23/20 0559  NA 139 140 143  K 4.8 4.3 4.5  CL 98 99 97*  CO2 19* 19* 22  GLUCOSE 116* 158* 132*  BUN 128* 122* 116*  CREATININE 15.49* 15.67* 15.88*  CALCIUM 7.6* 7.6* 7.7*  PHOS 9.5*  --  9.1*   GFR: Estimated Creatinine Clearance: 3.2 mL/min (A) (by C-G formula based on SCr of 15.88 mg/dL (H)).  Liver Function Tests: Recent Labs  Lab 06/18/20 1736 06/20/20 0521 06/21/20 0416 06/22/20 0510 06/23/20 0559  AST 35 33  --  31  --   ALT 13 11  --  9  --   ALKPHOS 39 43  --  42  --   BILITOT 0.9 0.8  --  0.8  --   PROT 6.4* 6.0*  --  5.7*  --   ALBUMIN 2.9* 2.8* 2.6* 2.5* 2.9*    HbA1C: Hemoglobin A1C  Date/Time Value Ref Range Status  09/19/2019 11:00 AM 6.2 (A) 4.0 - 5.6 % Final   Hgb A1c MFr Bld  Date/Time Value Ref Range Status  11/19/2019 05:24 PM 5.3 4.8 - 5.6 % Final    Comment:    (NOTE)         Prediabetes: 5.7 - 6.4         Diabetes: >6.4         Glycemic control for adults with diabetes: <7.0     Scheduled Meds: . diphenhydrAMINE      . famotidine      . methylPREDNISolone sodium succinate      . atorvastatin  40 mg Oral Daily  . carvedilol  6.25 mg Oral BID WC  . Chlorhexidine Gluconate Cloth  6 each Topical Daily  . epoetin (EPOGEN/PROCRIT) injection  20,000 Units Subcutaneous Weekly  . gentamicin cream  1 application Topical Daily  . guaiFENesin  600 mg Oral BID  . multivitamin  1 tablet Oral QHS  . pantoprazole  40 mg Intravenous Q12H  . polyethylene glycol  17 g Oral Daily     LOS: 5 days   Cherene Altes, MD [facility] Office  [phone] Pager - Text Page per Amion  If 7PM-7AM, please contact night-coverage per Amion 06/23/2020, 4:01 PM

## 2020-06-23 NOTE — Progress Notes (Signed)
CT head ordered by Sharion Settler, NP after daughter c/o " mydad doesn't look good and he's not responding to me."  This nurse asked the patient questions but no response.  Pt can not follow commands such as " stick out your tongue, smile", etc...  CBG 111, waiting for motning labs to result.  See vs's.  NP updating attending doctor now.

## 2020-06-23 NOTE — Progress Notes (Signed)
Albumin 1 amp started at this time as pt BP low and daughter c/o that he has "never been this low before".  NP notified, pt appears sleepy but otherwise no changes and he is getting PD exchange at this time.

## 2020-06-23 NOTE — Op Note (Signed)
OPERATIVE NOTE    PRE-OPERATIVE DIAGNOSIS: 1. ESRD   POST-OPERATIVE DIAGNOSIS: same as above  PROCEDURE: 1. Ultrasound guidance for vascular access to the left internal jugular vein 2. Fluoroscopic guidance for placement of catheter 3. Placement of a 23 cm tip to cuff tunneled hemodialysis catheter via the left internal jugular vein  SURGEON: Leotis Pain, MD  ANESTHESIA:  Local with Moderate conscious sedation for approximately 16 minutes using 0.5 mg of Versed   ESTIMATED BLOOD LOSS: 3 cc  FLUORO TIME: less than one minute  CONTRAST: none  FINDING(S): 1.  Patent left internal jugular vein  SPECIMEN(S):  None  INDICATIONS:   Scott Oconnor is a 75 y.o. male who presents with renal failure.  The patient needs long term dialysis access for their ESRD, and a Permcath is necessary.  Risks and benefits are discussed and informed consent is obtained.    DESCRIPTION: After obtaining full informed written consent, the patient was brought back to the vascular suited. The patient's left neck and chest were sterilely prepped and draped in a sterile surgical field was created. Moderate conscious sedation was administered during a face to face encounter with the patient throughout the procedure with my supervision of the RN administering medicines and monitoring the patient's vital signs, pulse oximetry, telemetry and mental status throughout from the start of the procedure until the patient was taken to the recovery room.  The left internal jugular vein was visualized with ultrasound and found to be patent. It was then accessed under direct ultrasound guidance and a permanent image was recorded. A wire was placed. After skin nick and dilatation, the peel-away sheath was placed over the wire. I then turned my attention to an area under the clavicle. Approximately 1-2 fingerbreadths below the clavicle a small counterincision was created and tunneled from the subclavicular incision to the access  site. Using fluoroscopic guidance, a 23 centimeter tip to cuff tunneled hemodialysis catheter was selected, and tunneled from the subclavicular incision to the access site. It was then placed through the peel-away sheath and the peel-away sheath was removed. Using fluoroscopic guidance the catheter tips were parked in the right atrium. The appropriate distal connectors were placed. It withdrew blood well and flushed easily with heparinized saline and a concentrated heparin solution was then placed. It was secured to the chest wall with 2 Prolene sutures. The access incision was closed single 4-0 Monocryl. A 4-0 Monocryl pursestring suture was placed around the exit site. Sterile dressings were placed. The patient tolerated the procedure well and was taken to the recovery room in stable condition.  COMPLICATIONS: None  CONDITION: Stable  Leotis Pain  06/23/2020, 5:22 PM   This note was created with Dragon Medical transcription system. Any errors in dictation are purely unintentional.

## 2020-06-23 NOTE — Progress Notes (Signed)
Albumin 1 amp started now due to hypotension during PD exchange.  Daughter, Tany upset and says his BP never gets this low during exchange.  BP 90/35.  NP Hassan Rowan notified and ordered the albumin.

## 2020-06-23 NOTE — Progress Notes (Signed)
[facility]  ROUNDING NOTE   Subjective:   [name] was admitted to [facility] on 06/18/2020 for Acute GI bleeding [K92.2] Generalized weakness [R53.1] Gastrointestinal hemorrhage, unspecified gastrointestinal hemorrhage type [K92.2] COVID-19 [U07.1] COVID-19+  Patient has been doing CAPD (manuals) at home. He was in the middle of peritoneal dialysis training when his daughter developed COVID-19 infection. Patient started feeling sick. Due to the COVID diagnosis, patient was asked to do manuals at home until he is off quarantine. However he started having dark tarry stools for a few days. He has been on apixaban for RIJ catheter thrombus.  Family denies use of NSAIDs.    Discharge was canceled yesterday because of syncopal episode prior to discharge.  He remains very weak and lethargic but is able to answer few simple questions appropriately.  Oral intake appears very poor.  He was able to eat some applesauce today.  He is having neuromuscular twitching today  Objective:  Vital signs in last 24 hours:  Temp:  [97.4 F (36.3 C)-98.7 F (37.1 C)] 97.4 F (36.3 C) (02/01 1512) Pulse Rate:  [63-78] 77 (02/01 1203) Resp:  [12-18] 18 (02/01 1512) BP: (90-166)/(35-72) 127/64 (02/01 1512) SpO2:  [98 %-100 %] 100 % (02/01 1512)  Weight change:  Filed Weights   06/18/20 1550 06/20/20 0519 06/22/20 0458  Weight: 52 kg 55.1 kg 55.2 kg    Intake/Output: I/O last 3 completed shifts: In: 357 [I.V.:307; IV Piggyback:50] Out: -    Intake/Output this shift:  No intake/output data recorded.  Physical Exam: General: NAD, laying in bed, appears weak, frail  Head: Normocephalic, atraumatic. Moist oral mucosal membranes  Lungs:  Clear to auscultation  Heart: Regular rate and rhythm  Abdomen:  Soft, nontender, +sutures  Extremities:  no peripheral edema.  Neurologic: Nonfocal, moving all four extremities  Skin: No lesions  Access: PD catheter    Basic Metabolic  Panel: Recent Labs  Lab 06/18/20 1736 06/20/20 0521 06/21/20 0416 06/22/20 0510 06/23/20 0559  NA 140 143 139 140 143  K 4.5 5.6* 4.8 4.3 4.5  CL 96* 102 98 99 97*  CO2 22 19* 19* 19* 22  GLUCOSE 142* 92 116* 158* 132*  BUN 92* 130* 128* 122* 116*  CREATININE 13.44* 15.64* 15.49* 15.67* 15.88*  CALCIUM 8.2* 7.6* 7.6* 7.6* 7.7*  PHOS  --   --  9.5*  --  9.1*    Liver Function Tests: Recent Labs  Lab 06/18/20 1736 06/20/20 0521 06/21/20 0416 06/22/20 0510 06/23/20 0559  AST 35 33  --  31  --   ALT 13 11  --  9  --   ALKPHOS 39 43  --  42  --   BILITOT 0.9 0.8  --  0.8  --   PROT 6.4* 6.0*  --  5.7*  --   ALBUMIN 2.9* 2.8* 2.6* 2.5* 2.9*   No results for input(s): LIPASE, AMYLASE in the last 168 hours. No results for input(s): AMMONIA in the last 168 hours.  CBC: Recent Labs  Lab 06/19/20 2015 06/20/20 0521 06/21/20 0416 06/22/20 0510 06/23/20 0559  WBC 8.7 9.4 7.4 7.9 9.2  HGB 8.7* 8.2* 8.0* 8.0* 8.1*  HCT 26.9* 24.7* 23.7* 23.3* 23.9*  MCV 100.4* 99.6 97.9 96.7 97.2  PLT 98* 89* 89* 93* 109*    Cardiac Enzymes: No results for input(s): CKTOTAL, CKMB, CKMBINDEX, TROPONINI in the last 168 hours.  BNP: Invalid input(s): POCBNP  CBG: Recent Labs  Lab 06/23/20 0636  GLUCAP  111*    Microbiology: Results for orders placed or performed during the hospital encounter of 05/06/20  Resp Panel by RT-PCR (Flu A&B, Covid) Nasopharyngeal Swab     Status: None   Collection Time: 05/06/20  2:14 AM   Specimen: Nasopharyngeal Swab; Nasopharyngeal(NP) swabs in vial transport medium  Result Value Ref Range Status   SARS Coronavirus 2 by RT PCR NEGATIVE NEGATIVE Final    Comment: (NOTE) SARS-CoV-2 target nucleic acids are NOT DETECTED.  The SARS-CoV-2 RNA is generally detectable in upper respiratory specimens during the acute phase of infection. The lowest concentration of SARS-CoV-2 viral copies this assay can detect is 138 copies/mL. A negative result does not  preclude SARS-Cov-2 infection and should not be used as the sole basis for treatment or other patient management decisions. A negative result may occur with  improper specimen collection/handling, submission of specimen other than nasopharyngeal swab, presence of viral mutation(s) within the areas targeted by this assay, and inadequate number of viral copies(<138 copies/mL). A negative result must be combined with clinical observations, patient history, and epidemiological information. The expected result is Negative.  Fact Sheet for Patients:  EntrepreneurPulse.com.au  Fact Sheet for Healthcare Providers:  IncredibleEmployment.be  This test is no t yet approved or cleared by the [place] FDA and  has been authorized for detection and/or diagnosis of SARS-CoV-2 by FDA under an Emergency Use Authorization (EUA). This EUA will remain  in effect (meaning this test can be used) for the duration of the COVID-19 declaration under Section 564(b)(1) of the Act, 21 U.S.C.section 360bbb-3(b)(1), unless the authorization is terminated  or revoked sooner.       Influenza A by PCR NEGATIVE NEGATIVE Final   Influenza B by PCR NEGATIVE NEGATIVE Final    Comment: (NOTE) The Xpert Xpress SARS-CoV-2/FLU/RSV plus assay is intended as an aid in the diagnosis of influenza from Nasopharyngeal swab specimens and should not be used as a sole basis for treatment. Nasal washings and aspirates are unacceptable for Xpert Xpress SARS-CoV-2/FLU/RSV testing.  Fact Sheet for Patients: EntrepreneurPulse.com.au  Fact Sheet for Healthcare Providers: IncredibleEmployment.be  This test is not yet approved or cleared by the [place] FDA and has been authorized for detection and/or diagnosis of SARS-CoV-2 by FDA under an Emergency Use Authorization (EUA). This EUA will remain in effect (meaning this test can be used) for the  duration of the COVID-19 declaration under Section 564(b)(1) of the Act, 21 U.S.C. section 360bbb-3(b)(1), unless the authorization is terminated or revoked.  Performed at [facility], [address] [place], [place] [address]     Coagulation Studies: No results for input(s): LABPROT, INR in the last 72 hours.  Urinalysis: No results for input(s): COLORURINE, LABSPEC, PHURINE, GLUCOSEU, HGBUR, BILIRUBINUR, KETONESUR, PROTEINUR, UROBILINOGEN, NITRITE, LEUKOCYTESUR in the last 72 hours.  Invalid input(s): APPERANCEUR    Imaging: CT HEAD WO CONTRAST  Result Date: 06/23/2020 CLINICAL DATA:  Mental status change.  COVID positive EXAM: CT HEAD WITHOUT CONTRAST TECHNIQUE: Contiguous axial images were obtained from the base of the skull through the vertex without intravenous contrast. COMPARISON:  CT head 06/08/2020 FINDINGS: Brain: Motion degraded study.  Multiple repeat attempts were made. Hypodensity in the thalamus bilaterally and in the periventricular white matter and left caudate most consistent with chronic ischemia. Small chronic infarct left cerebellum. Patchy hypodensity right cerebellum consistent with chronic infarct. No significant change from the prior study. Vascular: Normal arterial flow voids. Skull: Negative Sinuses/Orbits: Mucosal edema paranasal sinuses.  Negative orbit Other:  None IMPRESSION: No acute abnormality Chronic small vessel ischemic changes as noted above. Motion degraded study. Electronically Signed   By: Franchot Gallo M.D.   On: 06/23/2020 08:02     Medications:   . ceFAZolin    . sodium chloride    . [START ON 06/24/2020]  ceFAZolin (ANCEF) IV    . dialysis solution 1.5% low-MG/low-CA     . diphenhydrAMINE      . famotidine      . methylPREDNISolone sodium succinate      . atorvastatin  40 mg Oral Daily  . carvedilol  6.25 mg Oral BID WC  . Chlorhexidine Gluconate Cloth  6 each Topical Daily  . epoetin (EPOGEN/PROCRIT) injection  20,000  Units Subcutaneous Weekly  . gentamicin cream  1 application Topical Daily  . guaiFENesin  600 mg Oral BID  . multivitamin  1 tablet Oral QHS  . pantoprazole  40 mg Intravenous Q12H  . polyethylene glycol  17 g Oral Daily   acetaminophen, acetaminophen, calcium carbonate, diphenhydrAMINE, famotidine, guaiFENesin-dextromethorphan, HYDROmorphone (DILAUDID) injection, loperamide, methylPREDNISolone (SOLU-MEDROL) injection, midazolam, ondansetron (ZOFRAN) IV, ondansetron (ZOFRAN) IV  Assessment/ Plan:  Mr. Kable Reth is a 75 y.o. [place]  male with end stage renal disease on peritoneal dialysis, hypertension, AAA repair, congestive heart failure, peripheral vascular disease, raynaud's syndrome, hyperlipidemia who is [facility]18/2020 for Acute GI bleeding [K92.2] Generalized weakness [R53.1] Gastrointestinal hemorrhage, unspecified gastrointestinal hemorrhage type [K92.2] COVID-19 [U07.1]  [facility] 52.5kg CCPD 9 hours 5 exchanges 2078m fills  1. End Stage Renal Disease: on peritoneal dialysis. Has no completed training and doing CAPD at home Currently. Will place on CCPD with cycler tonight. 9 hours five exchanges with 2000 fills. This will be his outpatient prescription.  -Patient appears to have developed some uremia with high BUN and creatinine.  He is transitioning from hemodialysis to peritoneal dialysis and his prescription has not been adequately adjusted.  We have requested vascular surgery to place a PermCath back for backup hemodialysis for the next few weeks off and on.  Awaiting consult.  2. Anemia with chronic kidney disease and acute blood loss anemia from GI bleed.  Lab Results  Component Value Date   HGB 8.1 (L) 06/23/2020    - Appreciate GI input - Low threshold for transfusion - PPI  3. Hypertension:Home regimen of irbesartan, hydralazine, and carvedilol.  -Blood pressure low normal- meds on hold Use 1.5% dialysate  bags Torsemide on hold at present  4. Secondary Hyperparathyroidism: with hyperphosphatemia. PTH 390 on 06/04/20 as outpatient.  - calcium carbonate with meals.   5.  Covid-19 positive pneumonia Tested positive on June 18, 2020   LOS: 5 Miel Wisener SCandiss Norse2/1/20223:37 PM

## 2020-06-24 ENCOUNTER — Encounter: Payer: Self-pay | Admitting: Vascular Surgery

## 2020-06-24 DIAGNOSIS — G928 Other toxic encephalopathy: Secondary | ICD-10-CM

## 2020-06-24 DIAGNOSIS — D62 Acute posthemorrhagic anemia: Secondary | ICD-10-CM | POA: Diagnosis not present

## 2020-06-24 DIAGNOSIS — N186 End stage renal disease: Secondary | ICD-10-CM | POA: Diagnosis not present

## 2020-06-24 DIAGNOSIS — U071 COVID-19: Secondary | ICD-10-CM | POA: Diagnosis not present

## 2020-06-24 DIAGNOSIS — E785 Hyperlipidemia, unspecified: Secondary | ICD-10-CM

## 2020-06-24 LAB — COMPREHENSIVE METABOLIC PANEL
ALT: 10 U/L (ref 0–44)
AST: 62 U/L — ABNORMAL HIGH (ref 15–41)
Albumin: 2.6 g/dL — ABNORMAL LOW (ref 3.5–5.0)
Alkaline Phosphatase: 44 U/L (ref 38–126)
Anion gap: 20 — ABNORMAL HIGH (ref 5–15)
BUN: 51 mg/dL — ABNORMAL HIGH (ref 8–23)
CO2: 21 mmol/L — ABNORMAL LOW (ref 22–32)
Calcium: 7.6 mg/dL — ABNORMAL LOW (ref 8.9–10.3)
Chloride: 95 mmol/L — ABNORMAL LOW (ref 98–111)
Creatinine, Ser: 8.46 mg/dL — ABNORMAL HIGH (ref 0.61–1.24)
GFR, Estimated: 6 mL/min — ABNORMAL LOW (ref >60)
Glucose, Bld: 87 mg/dL (ref 70–99)
Potassium: 3.8 mmol/L (ref 3.5–5.1)
Sodium: 136 mmol/L (ref 135–145)
Total Bilirubin: 1.1 mg/dL (ref 0.3–1.2)
Total Protein: 5.9 g/dL — ABNORMAL LOW (ref 6.5–8.1)

## 2020-06-24 LAB — CBC WITH DIFFERENTIAL/PLATELET
Abs Immature Granulocytes: 0.1 10*3/uL — ABNORMAL HIGH (ref 0.00–0.07)
Basophils Absolute: 0 10*3/uL (ref 0.0–0.1)
Basophils Relative: 0 %
Eosinophils Absolute: 0 10*3/uL (ref 0.0–0.5)
Eosinophils Relative: 0 %
HCT: 23.8 % — ABNORMAL LOW (ref 39.0–52.0)
Hemoglobin: 8 g/dL — ABNORMAL LOW (ref 13.0–17.0)
Immature Granulocytes: 1 %
Lymphocytes Relative: 6 %
Lymphs Abs: 0.5 10*3/uL — ABNORMAL LOW (ref 0.7–4.0)
MCH: 33.3 pg (ref 26.0–34.0)
MCHC: 33.6 g/dL (ref 30.0–36.0)
MCV: 99.2 fL (ref 80.0–100.0)
Monocytes Absolute: 0.5 10*3/uL (ref 0.1–1.0)
Monocytes Relative: 5 %
Neutro Abs: 8.7 10*3/uL — ABNORMAL HIGH (ref 1.7–7.7)
Neutrophils Relative %: 88 %
Platelets: 100 10*3/uL — ABNORMAL LOW (ref 150–400)
RBC: 2.4 MIL/uL — ABNORMAL LOW (ref 4.22–5.81)
RDW: 15.7 % — ABNORMAL HIGH (ref 11.5–15.5)
Smear Review: NORMAL
WBC: 9.9 10*3/uL (ref 4.0–10.5)
nRBC: 0.3 % — ABNORMAL HIGH (ref 0.0–0.2)

## 2020-06-24 LAB — RENAL FUNCTION PANEL
Albumin: 2.5 g/dL — ABNORMAL LOW (ref 3.5–5.0)
Anion gap: 22 — ABNORMAL HIGH (ref 5–15)
BUN: 109 mg/dL — ABNORMAL HIGH (ref 8–23)
CO2: 20 mmol/L — ABNORMAL LOW (ref 22–32)
Calcium: 7.1 mg/dL — ABNORMAL LOW (ref 8.9–10.3)
Chloride: 98 mmol/L (ref 98–111)
Creatinine, Ser: 16.3 mg/dL — ABNORMAL HIGH (ref 0.61–1.24)
GFR, Estimated: 3 mL/min — ABNORMAL LOW (ref >60)
Glucose, Bld: 118 mg/dL — ABNORMAL HIGH (ref 70–99)
Phosphorus: 10 mg/dL — ABNORMAL HIGH (ref 2.5–4.6)
Potassium: 4.3 mmol/L (ref 3.5–5.1)
Sodium: 140 mmol/L (ref 135–145)

## 2020-06-24 LAB — CBC
HCT: 20.6 % — ABNORMAL LOW (ref 39.0–52.0)
Hemoglobin: 6.9 g/dL — ABNORMAL LOW (ref 13.0–17.0)
MCH: 32.7 pg (ref 26.0–34.0)
MCHC: 33.5 g/dL (ref 30.0–36.0)
MCV: 97.6 fL (ref 80.0–100.0)
Platelets: 98 10*3/uL — ABNORMAL LOW (ref 150–400)
RBC: 2.11 MIL/uL — ABNORMAL LOW (ref 4.22–5.81)
RDW: 15.7 % — ABNORMAL HIGH (ref 11.5–15.5)
WBC: 9.2 10*3/uL (ref 4.0–10.5)
nRBC: 0.3 % — ABNORMAL HIGH (ref 0.0–0.2)

## 2020-06-24 LAB — LACTIC ACID, PLASMA: Lactic Acid, Venous: 1.5 mmol/L (ref 0.5–1.9)

## 2020-06-24 LAB — PROCALCITONIN: Procalcitonin: 3.7 ng/mL

## 2020-06-24 LAB — PROTIME-INR
INR: 1.1 (ref 0.8–1.2)
Prothrombin Time: 14.2 seconds (ref 11.4–15.2)

## 2020-06-24 LAB — APTT: aPTT: 57 seconds — ABNORMAL HIGH (ref 24–36)

## 2020-06-24 MED ORDER — SODIUM CHLORIDE 0.9% IV SOLUTION: Freq: Once | INTRAVENOUS | Status: DC

## 2020-06-24 MED ORDER — SODIUM CHLORIDE 0.9 % IV BOLUS (SEPSIS)
500.0000 mL | Freq: Once | INTRAVENOUS | Status: AC
  Administered 2020-06-24: 500 mL via INTRAVENOUS

## 2020-06-24 MED ORDER — ACETAMINOPHEN 650 MG RE SUPP
975.0000 mg | Freq: Once | RECTAL | Status: AC
  Administered 2020-06-24: 975 mg via RECTAL

## 2020-06-24 MED ORDER — BENZONATATE 100 MG PO CAPS: 100.0000 mg | ORAL_CAPSULE | Freq: Three times a day (TID) | ORAL | Status: DC

## 2020-06-24 NOTE — Progress Notes (Signed)
GI Inpatient Follow-up Note  Subjective:  Hemoglobin drop noted but only one bowel movement overnight and was firmer than previously per daughter. More alert today. Hemodialysis catheter placed yesterday with plans for hemodialysis today per nephrology.  Scheduled Inpatient Medications:  . sodium chloride   Intravenous Once  . atorvastatin  40 mg Oral Daily  . benzonatate  100 mg Oral Q8H  . carvedilol  6.25 mg Oral BID WC  . Chlorhexidine Gluconate Cloth  6 each Topical Daily  . epoetin (EPOGEN/PROCRIT) injection  20,000 Units Subcutaneous Weekly  . gentamicin cream  1 application Topical Daily  . guaiFENesin  600 mg Oral BID  . multivitamin  1 tablet Oral QHS  . pantoprazole  40 mg Intravenous Q12H  . polyethylene glycol  17 g Oral Daily    Continuous Inpatient Infusions:   . dialysis solution 1.5% low-MG/low-CA      PRN Inpatient Medications:  acetaminophen, acetaminophen, calcium carbonate, guaiFENesin-dextromethorphan, HYDROmorphone (DILAUDID) injection, loperamide, ondansetron (ZOFRAN) IV, ondansetron (ZOFRAN) IV  Review of Systems:  Review of Systems  Constitutional: Positive for malaise/fatigue. Negative for chills and fever.  Respiratory: Positive for cough.   Cardiovascular: Negative for chest pain.  Gastrointestinal: Negative for abdominal pain, constipation, diarrhea, nausea and vomiting.  Neurological: Positive for weakness.  Psychiatric/Behavioral: Negative for substance abuse.  All other systems reviewed and are negative.     Physical Examination: BP (!) 141/56 (BP Location: Right Arm)   Pulse 64   Temp 98.2 F (36.8 C)   Resp 16   Ht '5\' 10"'$  (1.778 m)   Wt 55.2 kg   SpO2 91%   BMI 17.48 kg/m  Gen: NAD, alert and oriented x 4, chronically ill appearing HEENT: PEERLA, EOMI, Neck: supple, no JVD or thyromegaly Chest: No respiratory distress Abd: soft, non-distended Ext: no edema Skin: no rash or lesions noted  Data: Lab Results  Component  Value Date   WBC 9.2 06/24/2020   HGB 6.9 (L) 06/24/2020   HCT 20.6 (L) 06/24/2020   MCV 97.6 06/24/2020   PLT 98 (L) 06/24/2020   Recent Labs  Lab 06/22/20 0510 06/23/20 0559 06/24/20 0619  HGB 8.0* 8.1* 6.9*   Lab Results  Component Value Date   NA 140 06/24/2020   K 4.3 06/24/2020   CL 98 06/24/2020   CO2 20 (L) 06/24/2020   BUN 109 (H) 06/24/2020   CREATININE 16.30 (H) 06/24/2020   Lab Results  Component Value Date   ALT 9 06/22/2020   AST 31 06/22/2020   ALKPHOS 42 06/22/2020   BILITOT 0.8 06/22/2020   No results for input(s): APTT, INR, PTT in the last 168 hours. Assessment/Plan:  Scott Oconnor is a 75 y.o. gentleman with history of ESRD and now COVID who presented with dark stool and drop in hemoglobin. Hemoglobin drop overnight likely multifactorial but regardless will need hemodialysis.  Recommendations:  - continue supportive care - maintain active type and screen and transfuse if hemoglobin < 7 - continue PPI BID PO - monitor for any GI bleeding  - will follow along to assess hemoglobin trend after repeat transfusion and initiation of dialysis  Please call with any questions or concerns.  Raylene Miyamoto MD, MPH [place]

## 2020-06-24 NOTE — Progress Notes (Signed)
   06/24/20 2115  Vitals  Temp (!) 102 F (38.9 C)  Temp Source Axillary  BP (!) 120/55  MAP (mmHg) 73  BP Location Right Arm  BP Method Automatic  Patient Position (if appropriate) Lying  Pulse Rate 98  Resp (!) 22  Level of Consciousness  Level of Consciousness Alert  MEWS COLOR  MEWS Score Color Yellow  Oxygen Therapy  SpO2 100 %  O2 Device Nasal Cannula  O2 Flow Rate (L/min) 3 L/min  MEWS Score  MEWS Temp 2  MEWS Systolic 0  MEWS Pulse 0  MEWS RR 1  MEWS LOC 0  MEWS Score 3  Provider Notification  Provider Name/Title Dr Damita Dunnings  Date Provider Notified 06/24/20  Time Provider Notified 2115  Notification Type Page  Notification Reason Other (Comment) (yelow mews triggred by 102 temp)  Response See new orders  Date of Provider Response 06/24/20  Time of Provider Response 2116  Rapid Response Notification  Name of Rapid Response RN Notified Dylan, RN  Date Rapid Response Notified 06/24/20  Time Rapid Response Notified 2125

## 2020-06-24 NOTE — Progress Notes (Signed)
Rapid Response Consult:  RRT was consulted on this patient for [facility] MEWS. Patient is a 74/M, full code, admitted 06/18/20 with COVID-19 PNA. Patient is febrile this evening with T-max 38.9C. Patient is receiving fluid resuscitation post-HD. VS otherwise consistent with acute COVID-19 PNA. Patient is requiring 3 L/min supplemental O2. Patient is receiving APAP for the fever. No escalation of care required at this time.

## 2020-06-24 NOTE — TOC Initial Note (Signed)
Transition of Care [facility]) - Initial/Assessment Note    Patient Details  Name: Scott Oconnor MRN: [id number] Date of Birth: 05-31-1945  [facility]) CM/SW Contact:    Scott Ivan, LCSW Phone Number: 06/24/2020, 1:17 PM  Clinical Narrative:           CSW spoke with patient's daughter Scott Oconnor. Patient lives with Scott Oconnor who provides transportation to appointments. PCP is Dr. Tressia Oconnor. [facility]acy is [facility] [address]. Patient has dialysis at home. No HH or SNF history. Scott Oconnor about Home Health services. Spoke to MD who agrees patient would benefit from [facility]. Reached out to Scott Oconnor to see if he can accept patient for PT, RN, and Aide services, waiting to hear back.        Expected Discharge Plan: [place] Barriers to Discharge: Continued Medical Work up   Patient Goals and CMS Choice Patient states their goals for this hospitalization and ongoing recovery are:: home with home health CMS Medicare.gov Compare Post Acute Care list provided to:: Patient Represenative (must comment) Choice offered to / list presented to : Adult Children  Expected Discharge Plan and Services Expected Discharge Plan: [place]       Living arrangements for the past 2 months: Single Family Home Expected Discharge Date: 06/22/20                         [facility] Arranged: RN,PT,Nurse's Aide          Prior Living Arrangements/Services Living arrangements for the past 2 months: Single Family Home Lives with:: Adult Children Patient language and need for interpreter reviewed:: Yes Do you feel safe going back to the place where you live?: Yes      Need for Family Participation in Patient Care: Yes (Comment) Care giver support system in place?: Yes (comment) Current home services: DME Criminal Activity/Legal Involvement Pertinent to Current Situation/Hospitalization: No - Comment as needed  Activities of Daily  Living      Permission Sought/Granted Permission sought to share information with : Chartered certified accountant granted to share information with : Yes, Verbal Permission Granted (by daughter Scott Oconnor)     Permission granted to share info w AGENCY: Scott Oconnor agencies        Emotional Assessment       Orientation: : Fluctuating Orientation (Suspected and/or reported Sundowners) Alcohol / Substance Use: Not Applicable Psych Involvement: No (comment)  Admission diagnosis:  Acute GI bleeding [K92.2] Generalized weakness [R53.1] Gastrointestinal hemorrhage, unspecified gastrointestinal hemorrhage type [K92.2] COVID-19 [U07.1] Patient Active Problem List   Diagnosis Date Noted  . Anemia of chronic kidney failure, stage 5 ([place]) 05/06/2020  . Acute on chronic systolic CHF (congestive heart failure) ([place]) 05/06/2020  . Hypertensive emergency 05/06/2020  . Elevated troponin 05/06/2020  . Acute CHF (congestive heart failure) ([place]) 05/06/2020  . Acute respiratory failure with hypoxia ([place]) 05/06/2020  . Chronic anticoagulation 05/06/2020  . End stage renal disease on dialysis ([place]) 04/11/2020  . Altered mental status 04/10/2020  . HFrEF (heart failure with reduced ejection fraction) ([place])   . Acute pulmonary edema ([place]) 11/18/2019  . Protein-calorie malnutrition, severe 09/27/2019  . Acute kidney failure, unspecified ([place])   . History of AAA (abdominal aortic aneurysm) repair   . Anemia   . Thrombocytopenia ([place])   . Acute kidney injury superimposed on CKD ([place])   . Essential hypertension   . Hyperlipidemia   .  Fatigue 09/19/2019  . AAA (abdominal aortic aneurysm) ([place]) 08/14/2019  . AAA (abdominal aortic aneurysm) without rupture ([place]) 08/01/2019  . PVD (peripheral vascular disease) ([place]) 07/24/2019  . Tobacco abuse 07/24/2019  . Raynaud's disease without gangrene 06/21/2019  . Mixed dyslipidemia 06/15/2018  . Family history of heart attack 06/15/2018  . Smokers' cough  ([place]) 06/16/2017   PCP:  [name], MD Pharmacy:   [facility] [phone] [address], [address] [place] [address] Phone: [phone] Fax: [address], [address] [place] [address] Phone: [phone] Fax: [phone]     Social Determinants of Health (SDOH) Interventions    Readmission Risk Interventions Readmission Risk Prevention Plan 06/24/2020  Transportation Screening Complete  Medication Review ([place]) Complete  PCP or Specialist appointment within 3-5 days of discharge Complete  HRI or Home Care Consult Complete  SW Recovery Care/Counseling Consult Complete  Palliative Care Screening Not [place] Complete  Some recent data might be hidden

## 2020-06-24 NOTE — Sepsis Progress Note (Signed)
Following for sepsis monitoring ?

## 2020-06-24 NOTE — Progress Notes (Signed)
Patient ID: Scott Oconnor, male   DOB: 1946-01-13, 75 y.o.   MRN: [id number] [facility] PROGRESS NOTE  Matin Holk [id number] DOB: 07-31-1945 DOA: 06/18/2020 PCP: [name], MD  HPI/Subjective: Patient more awake today than yesterday as per the daughter.  Answering questions appropriately.  Some cough.  Able to eat a little bit yesterday.  Objective: Vitals:   06/24/20 0810 06/24/20 1206  BP: (!) 145/56 (!) 141/56  Pulse: 68 64  Resp: 18 16  Temp: 99.2 F (37.3 C) 98.2 F (36.8 C)  SpO2: 93% 91%   No intake or output data in the 24 hours ending 06/24/20 1337 Filed Weights   06/18/20 1550 06/20/20 0519 06/22/20 0458  Weight: 52 kg 55.1 kg 55.2 kg    ROS: Review of Systems  Respiratory: Positive for cough. Negative for shortness of breath.   Cardiovascular: Negative for chest pain.  Gastrointestinal: Negative for abdominal pain.   Exam: Physical Exam HENT:     Head: Normocephalic.     Mouth/Throat:     Pharynx: No oropharyngeal exudate.  Eyes:     General: Lids are normal.     Conjunctiva/sclera: Conjunctivae normal.  Cardiovascular:     Rate and Rhythm: Normal rate and regular rhythm.     Heart sounds: Normal heart sounds, S1 normal and S2 normal.  Pulmonary:     Breath sounds: Examination of the right-lower field reveals decreased breath sounds and rhonchi. Examination of the left-lower field reveals decreased breath sounds and rhonchi. Decreased breath sounds and rhonchi present. No wheezing or rales.  Abdominal:     Palpations: Abdomen is soft.     Tenderness: There is no abdominal tenderness.  Musculoskeletal:     Right lower leg: No swelling.     Left lower leg: No swelling.  Skin:    General: Skin is warm.     Findings: No rash.  Neurological:     Mental Status: He is alert.     Comments: Answers some yes or no questions.       Data Reviewed: Basic Metabolic Panel: Recent Labs  Lab 06/20/20 0521 06/21/20 0416  06/22/20 0510 06/23/20 0559 06/24/20 0619  NA 143 139 140 143 140  K 5.6* 4.8 4.3 4.5 4.3  CL 102 98 99 97* 98  CO2 19* 19* 19* 22 20*  GLUCOSE 92 116* 158* 132* 118*  BUN 130* 128* 122* 116* 109*  CREATININE 15.64* 15.49* 15.67* 15.88* 16.30*  CALCIUM 7.6* 7.6* 7.6* 7.7* 7.1*  PHOS  --  9.5*  --  9.1* 10.0*   Liver Function Tests: Recent Labs  Lab 06/18/20 1736 06/20/20 0521 06/21/20 0416 06/22/20 0510 06/23/20 0559 06/24/20 0619  AST 35 33  --  31  --   --   ALT 13 11  --  9  --   --   ALKPHOS 39 43  --  42  --   --   BILITOT 0.9 0.8  --  0.8  --   --   PROT 6.4* 6.0*  --  5.7*  --   --   ALBUMIN 2.9* 2.8* 2.6* 2.5* 2.9* 2.5*   CBC: Recent Labs  Lab 06/20/20 0521 06/21/20 0416 06/22/20 0510 06/23/20 0559 06/24/20 0619  WBC 9.4 7.4 7.9 9.2 9.2  HGB 8.2* 8.0* 8.0* 8.1* 6.9*  HCT 24.7* 23.7* 23.3* 23.9* 20.6*  MCV 99.6 97.9 96.7 97.2 97.6  PLT 89* 89* 93* 109* 98*     Studies: CT HEAD WO CONTRAST  Result  Date: 06/23/2020 CLINICAL DATA:  Mental status change.  COVID positive EXAM: CT HEAD WITHOUT CONTRAST TECHNIQUE: Contiguous axial images were obtained from the base of the skull through the vertex without intravenous contrast. COMPARISON:  CT head 06/08/2020 FINDINGS: Brain: Motion degraded study.  Multiple repeat attempts were made. Hypodensity in the thalamus bilaterally and in the periventricular white matter and left caudate most consistent with chronic ischemia. Small chronic infarct left cerebellum. Patchy hypodensity right cerebellum consistent with chronic infarct. No significant change from the prior study. Vascular: Normal arterial flow voids. Skull: Negative Sinuses/Orbits: Mucosal edema paranasal sinuses.  Negative orbit Other: None IMPRESSION: No acute abnormality Chronic small vessel ischemic changes as noted above. Motion degraded study. Electronically Signed   By: Franchot Gallo M.D.   On: 06/23/2020 08:02   PERIPHERAL VASCULAR  CATHETERIZATION  Result Date: 06/24/2020 See op note   Scheduled Meds: . sodium chloride   Intravenous Once  . atorvastatin  40 mg Oral Daily  . benzonatate  100 mg Oral Q8H  . carvedilol  6.25 mg Oral BID WC  . Chlorhexidine Gluconate Cloth  6 each Topical Daily  . epoetin (EPOGEN/PROCRIT) injection  20,000 Units Subcutaneous Weekly  . gentamicin cream  1 application Topical Daily  . guaiFENesin  600 mg Oral BID  . multivitamin  1 tablet Oral QHS  . pantoprazole  40 mg Intravenous Q12H  . polyethylene glycol  17 g Oral Daily   Continuous Infusions: . dialysis solution 1.5% low-MG/low-CA      Assessment/Plan:  1. Acute blood loss anemia.  Since hemoglobin dipped down to 6.9 I will give 1 unit of packed red blood cells today.  GI following.  Off Eliquis.  On Protonix. 2. Covid positive infection.  Family declined remdesivir 3-day infusion. 3. Acute toxic metabolic encephalopathy secondary to uremia.  Nephrology to do hemodialysis.  Mental status better today than yesterday as per daughter at the bedside. 4. End-stage renal disease.  Catheter placed yesterday for hemodialysis.  Peritoneal dialysis hopefully as outpatient. 5. Chronic systolic congestive heart failure.  Dialysis to manage fluid.  On Coreg 6. Hyperlipidemia unspecified on atorvastatin 7. Essential hypertension on Coreg 8. Physical therapy evaluation for tomorrow     Code Status:     Code Status Orders  (From admission, onward)         Start     Ordered   06/18/20 2122  Full code  Continuous        06/18/20 2123        Code Status History    Date Active Date Inactive Code Status Order ID Comments User Context   05/06/2020 0355 05/06/2020 2301 Full Code [id number]  Athena Masse, MD ED   04/10/2020 1913 04/11/2020 2230 Full Code [id number]  Cox, Amy N, DO ED   11/19/2019 0051 11/23/2019 2133 Full Code [id number]  Nelle Don, MD Inpatient   09/20/2019 1416 09/30/2019 2350 Full Code [id number]  Loletha Grayer, MD ED   08/14/2019 1132 08/15/2019 2113 Full Code [id number]  Schnier, Dolores Lory, MD Inpatient   Advance Care Planning Activity     Family Communication: Spoke with daughter at the bedside Disposition Plan: Status is: Inpatient  Dispo: The patient is from: Home              Anticipated d/c is to: Home with home health              Anticipated d/c date is: Likely will need a few days here in the  hospital              Patient currently will undergo hemodialysis for uremic symptoms.  Also need to make sure his hemoglobin stabilizes   Difficult to place patient.  No if able to go home.  Yes if needs rehab.  Time spent: 28 minutes  [place]

## 2020-06-24 NOTE — Progress Notes (Addendum)
Patient moved to 1C. Patient is high risk for readmission. Patient oriented to person only per chart review and is on Airborne Precautions. Attempted call to daughter Scott Oconnor for readmission screening. Left a VM requesting a return call.    Oleh Genin, [place]

## 2020-06-24 NOTE — Progress Notes (Signed)
Dr. Damita Dunnings aware of yellow mews and axillary temp of 102. Blood tranfusion on hold at this time due to temperature. Layers of sheets removed, warm compress applied to patients skin to help lower temperature. Labs drawn  As ordered.

## 2020-06-24 NOTE — Progress Notes (Signed)
[facility]  ROUNDING NOTE   Subjective:   [name] was admitted to [facility] on 06/18/2020 for Acute GI bleeding [K92.2] Generalized weakness [R53.1] Gastrointestinal hemorrhage, unspecified gastrointestinal hemorrhage type [K92.2] COVID-19 [U07.1] COVID-19+  His daughter reports that he is doing a little bit better today. More awake and does not have a neuromuscular twitching today. Also starting to eat just a little.   Objective:  Vital signs in last 24 hours:  Temp:  [97.4 F (36.3 C)-99.8 F (37.7 C)] 98.2 F (36.8 C) (02/02 1206) Pulse Rate:  [64-88] 64 (02/02 1206) Resp:  [13-18] 16 (02/02 1206) BP: (115-159)/(56-78) 141/56 (02/02 1206) SpO2:  [91 %-100 %] 91 % (02/02 1206)  Weight change:  Filed Weights   06/18/20 1550 06/20/20 0519 06/22/20 0458  Weight: 52 kg 55.1 kg 55.2 kg    Intake/Output: I/O last 3 completed shifts: In: 60.1 [I.V.:10.1; IV Piggyback:50] Out: -    Intake/Output this shift:  No intake/output data recorded.  Physical Exam: General: NAD, laying in bed, appears weak, frail  Head: Normocephalic, atraumatic. Moist oral mucosal membranes  Lungs:  Clear to auscultation  Heart: Regular rate and rhythm  Abdomen:  Soft, nontender, +sutures  Extremities:  no peripheral edema.  Neurologic:  Alert, able to answer yes/no  Skin: No lesions  Access: PD catheter, left IJ PermCath    Basic Metabolic Panel: Recent Labs  Lab 06/20/20 0521 06/21/20 0416 06/22/20 0510 06/23/20 0559 06/24/20 0619  NA 143 139 140 143 140  K 5.6* 4.8 4.3 4.5 4.3  CL 102 98 99 97* 98  CO2 19* 19* 19* 22 20*  GLUCOSE 92 116* 158* 132* 118*  BUN 130* 128* 122* 116* 109*  CREATININE 15.64* 15.49* 15.67* 15.88* 16.30*  CALCIUM 7.6* 7.6* 7.6* 7.7* 7.1*  PHOS  --  9.5*  --  9.1* 10.0*    Liver Function Tests: Recent Labs  Lab 06/18/20 1736 06/20/20 0521 06/21/20 0416 06/22/20 0510 06/23/20 0559 06/24/20 0619  AST 35 33  --  31  --   --    ALT 13 11  --  9  --   --   ALKPHOS 39 43  --  42  --   --   BILITOT 0.9 0.8  --  0.8  --   --   PROT 6.4* 6.0*  --  5.7*  --   --   ALBUMIN 2.9* 2.8* 2.6* 2.5* 2.9* 2.5*   No results for input(s): LIPASE, AMYLASE in the last 168 hours. No results for input(s): AMMONIA in the last 168 hours.  CBC: Recent Labs  Lab 06/20/20 0521 06/21/20 0416 06/22/20 0510 06/23/20 0559 06/24/20 0619  WBC 9.4 7.4 7.9 9.2 9.2  HGB 8.2* 8.0* 8.0* 8.1* 6.9*  HCT 24.7* 23.7* 23.3* 23.9* 20.6*  MCV 99.6 97.9 96.7 97.2 97.6  PLT 89* 89* 93* 109* 98*    Cardiac Enzymes: No results for input(s): CKTOTAL, CKMB, CKMBINDEX, TROPONINI in the last 168 hours.  BNP: Invalid input(s): POCBNP  CBG: Recent Labs  Lab 06/23/20 0636  GLUCAP 111*    Microbiology: Results for orders placed or performed during the hospital encounter of 05/06/20  Resp Panel by RT-PCR (Flu A&B, Covid) Nasopharyngeal Swab     Status: None   Collection Time: 05/06/20  2:14 AM   Specimen: Nasopharyngeal Swab; Nasopharyngeal(NP) swabs in vial transport medium  Result Value Ref Range Status   SARS Coronavirus 2 by RT PCR NEGATIVE NEGATIVE Final    Comment: (  NOTE) SARS-CoV-2 target nucleic acids are NOT DETECTED.  The SARS-CoV-2 RNA is generally detectable in upper respiratory specimens during the acute phase of infection. The lowest concentration of SARS-CoV-2 viral copies this assay can detect is 138 copies/mL. A negative result does not preclude SARS-Cov-2 infection and should not be used as the sole basis for treatment or other patient management decisions. A negative result may occur with  improper specimen collection/handling, submission of specimen other than nasopharyngeal swab, presence of viral mutation(s) within the areas targeted by this assay, and inadequate number of viral copies(<138 copies/mL). A negative result must be combined with clinical observations, patient history, and epidemiological information.  The expected result is Negative.  Fact Sheet for Patients:  EntrepreneurPulse.com.au  Fact Sheet for Healthcare Providers:  IncredibleEmployment.be  This test is no t yet approved or cleared by the [place] FDA and  has been authorized for detection and/or diagnosis of SARS-CoV-2 by FDA under an Emergency Use Authorization (EUA). This EUA will remain  in effect (meaning this test can be used) for the duration of the COVID-19 declaration under Section 564(b)(1) of the Act, 21 U.S.C.section 360bbb-3(b)(1), unless the authorization is terminated  or revoked sooner.       Influenza A by PCR NEGATIVE NEGATIVE Final   Influenza B by PCR NEGATIVE NEGATIVE Final    Comment: (NOTE) The Xpert Xpress SARS-CoV-2/FLU/RSV plus assay is intended as an aid in the diagnosis of influenza from Nasopharyngeal swab specimens and should not be used as a sole basis for treatment. Nasal washings and aspirates are unacceptable for Xpert Xpress SARS-CoV-2/FLU/RSV testing.  Fact Sheet for Patients: EntrepreneurPulse.com.au  Fact Sheet for Healthcare Providers: IncredibleEmployment.be  This test is not yet approved or cleared by the [place] FDA and has been authorized for detection and/or diagnosis of SARS-CoV-2 by FDA under an Emergency Use Authorization (EUA). This EUA will remain in effect (meaning this test can be used) for the duration of the COVID-19 declaration under Section 564(b)(1) of the Act, 21 U.S.C. section 360bbb-3(b)(1), unless the authorization is terminated or revoked.  Performed at [facility], [address] [place], [place] [address]     Coagulation Studies: No results for input(s): LABPROT, INR in the last 72 hours.  Urinalysis: No results for input(s): COLORURINE, LABSPEC, PHURINE, GLUCOSEU, HGBUR, BILIRUBINUR, KETONESUR, PROTEINUR, UROBILINOGEN, NITRITE, LEUKOCYTESUR in the last  72 hours.  Invalid input(s): APPERANCEUR    Imaging: CT HEAD WO CONTRAST  Result Date: 06/23/2020 CLINICAL DATA:  Mental status change.  COVID positive EXAM: CT HEAD WITHOUT CONTRAST TECHNIQUE: Contiguous axial images were obtained from the base of the skull through the vertex without intravenous contrast. COMPARISON:  CT head 06/08/2020 FINDINGS: Brain: Motion degraded study.  Multiple repeat attempts were made. Hypodensity in the thalamus bilaterally and in the periventricular white matter and left caudate most consistent with chronic ischemia. Small chronic infarct left cerebellum. Patchy hypodensity right cerebellum consistent with chronic infarct. No significant change from the prior study. Vascular: Normal arterial flow voids. Skull: Negative Sinuses/Orbits: Mucosal edema paranasal sinuses.  Negative orbit Other: None IMPRESSION: No acute abnormality Chronic small vessel ischemic changes as noted above. Motion degraded study. Electronically Signed   By: Franchot Gallo M.D.   On: 06/23/2020 08:02   PERIPHERAL VASCULAR CATHETERIZATION  Result Date: 06/24/2020 See op note    Medications:   . dialysis solution 1.5% low-MG/low-CA     . sodium chloride   Intravenous Once  . atorvastatin  40 mg Oral Daily  .  benzonatate  100 mg Oral Q8H  . carvedilol  6.25 mg Oral BID WC  . Chlorhexidine Gluconate Cloth  6 each Topical Daily  . epoetin (EPOGEN/PROCRIT) injection  20,000 Units Subcutaneous Weekly  . gentamicin cream  1 application Topical Daily  . guaiFENesin  600 mg Oral BID  . multivitamin  1 tablet Oral QHS  . pantoprazole  40 mg Intravenous Q12H  . polyethylene glycol  17 g Oral Daily   acetaminophen, acetaminophen, calcium carbonate, guaiFENesin-dextromethorphan, HYDROmorphone (DILAUDID) injection, loperamide, ondansetron (ZOFRAN) IV, ondansetron (ZOFRAN) IV  Assessment/ Plan:  [name] is a 75 y.o. [place]  male with end stage renal disease on  peritoneal dialysis, hypertension, AAA repair, congestive heart failure, peripheral vascular disease, raynaud's syndrome, hyperlipidemia who is admitted to [facility] on 06/18/2020 for Acute GI bleeding [K92.2] Generalized weakness [R53.1] Gastrointestinal hemorrhage, unspecified gastrointestinal hemorrhage type [K92.2] COVID-19 [U07.1]  [facility] 52.5kg CCPD 9 hours 5 exchanges 2045m fills  1. End Stage Renal Disease:  -Hemodialysis today for uremia -We will go back on CCPD at home -Next hemodialysis possibly tomorrow morning  2. Anemia with chronic kidney disease and acute blood loss anemia from GI bleed.  Lab Results  Component Value Date   HGB 6.9 (L) 06/24/2020    - Appreciate GI input - Low threshold for transfusion - PPI -Blood transfusion planned with hemodialysis today -Getting EPO SQ weekly 20,000 units  3. Hypertension:Home regimen of irbesartan, hydralazine, and carvedilol.  -Blood pressure low normal- meds on hold Use 1.5% dialysate bags Torsemide on hold at present Carvedilol restarted  4. Secondary Hyperparathyroidism: with hyperphosphatemia. PTH 390 on 06/04/20 as outpatient.  - calcium carbonate with meals.   5.  Covid-19 positive pneumonia Tested positive on June 18, 2020   LOS: 6 Lian Tanori SCandiss Norse2/2/20222:56 PM

## 2020-06-24 NOTE — Progress Notes (Incomplete)
Patient report received Pt will be transferring to 1C bed 126 Report called to 1C-RN Pt daughter at bedside and updated on plan of care Verbalizes an understanding.

## 2020-06-25 ENCOUNTER — Inpatient Hospital Stay: Payer: Medicare Other

## 2020-06-25 DIAGNOSIS — J181 Lobar pneumonia, unspecified organism: Secondary | ICD-10-CM | POA: Diagnosis not present

## 2020-06-25 DIAGNOSIS — G928 Other toxic encephalopathy: Secondary | ICD-10-CM | POA: Diagnosis not present

## 2020-06-25 DIAGNOSIS — D62 Acute posthemorrhagic anemia: Secondary | ICD-10-CM | POA: Diagnosis not present

## 2020-06-25 DIAGNOSIS — U071 COVID-19: Secondary | ICD-10-CM | POA: Diagnosis not present

## 2020-06-25 LAB — CBC
HCT: 24 % — ABNORMAL LOW (ref 39.0–52.0)
Hemoglobin: 8.1 g/dL — ABNORMAL LOW (ref 13.0–17.0)
MCH: 33.1 pg (ref 26.0–34.0)
MCHC: 33.8 g/dL (ref 30.0–36.0)
MCV: 98 fL (ref 80.0–100.0)
Platelets: 112 10*3/uL — ABNORMAL LOW (ref 150–400)
RBC: 2.45 MIL/uL — ABNORMAL LOW (ref 4.22–5.81)
RDW: 15.7 % — ABNORMAL HIGH (ref 11.5–15.5)
WBC: 9.8 10*3/uL (ref 4.0–10.5)
nRBC: 0.3 % — ABNORMAL HIGH (ref 0.0–0.2)

## 2020-06-25 LAB — FERRITIN: Ferritin: 3610 ng/mL — ABNORMAL HIGH (ref 24–336)

## 2020-06-25 LAB — BASIC METABOLIC PANEL
Anion gap: 20 — ABNORMAL HIGH (ref 5–15)
BUN: 60 mg/dL — ABNORMAL HIGH (ref 8–23)
CO2: 22 mmol/L (ref 22–32)
Calcium: 7.6 mg/dL — ABNORMAL LOW (ref 8.9–10.3)
Chloride: 95 mmol/L — ABNORMAL LOW (ref 98–111)
Creatinine, Ser: 9.52 mg/dL — ABNORMAL HIGH (ref 0.61–1.24)
GFR, Estimated: 5 mL/min — ABNORMAL LOW (ref >60)
Glucose, Bld: 83 mg/dL (ref 70–99)
Potassium: 4.3 mmol/L (ref 3.5–5.1)
Sodium: 137 mmol/L (ref 135–145)

## 2020-06-25 LAB — MRSA PCR SCREENING: MRSA by PCR: NEGATIVE

## 2020-06-25 LAB — PROCALCITONIN: Procalcitonin: 8.03 ng/mL

## 2020-06-25 LAB — LACTIC ACID, PLASMA: Lactic Acid, Venous: 1.2 mmol/L (ref 0.5–1.9)

## 2020-06-25 LAB — C-REACTIVE PROTEIN: CRP: 24.1 mg/dL — ABNORMAL HIGH (ref <1.0)

## 2020-06-25 MED ORDER — METHYLPREDNISOLONE SODIUM SUCC 40 MG IJ SOLR
40.0000 mg | Freq: Every day | INTRAMUSCULAR | Status: DC
  Administered 2020-06-25 – 2020-06-27 (×3): 40 mg via INTRAVENOUS
  Filled 2020-06-25 (×3): qty 1

## 2020-06-25 MED ORDER — VANCOMYCIN HCL IN DEXTROSE 1-5 GM/200ML-% IV SOLN
1000.0000 mg | Freq: Once | INTRAVENOUS | Status: AC
  Administered 2020-06-25: 16:00:00 1000 mg via INTRAVENOUS
  Filled 2020-06-25: qty 200

## 2020-06-25 MED ORDER — VANCOMYCIN VARIABLE DOSE PER UNSTABLE RENAL FUNCTION (PHARMACIST DOSING): Status: DC

## 2020-06-25 MED ORDER — ACETAMINOPHEN 10 MG/ML IV SOLN
1000.0000 mg | Freq: Four times a day (QID) | INTRAVENOUS | Status: DC | PRN
  Administered 2020-06-26: 1000 mg via INTRAVENOUS
  Filled 2020-06-25: qty 100

## 2020-06-25 MED ORDER — PIPERACILLIN-TAZOBACTAM IN DEX 2-0.25 GM/50ML IV SOLN
2.2500 g | Freq: Three times a day (TID) | INTRAVENOUS | Status: DC
  Administered 2020-06-25 – 2020-06-27 (×6): 2.25 g via INTRAVENOUS
  Filled 2020-06-25 (×10): qty 50

## 2020-06-25 NOTE — Progress Notes (Signed)
Nurse is notifed of elevated temp. This nurse retakes axillary temp and records 100.6. Room temp is warm. Pt family is at bedside and I informed the daughter that we would adjust the room as well as remove all covers from patient. Cold compresses are applied.

## 2020-06-25 NOTE — Consult Note (Signed)
Pharmacy Antibiotic Note  Scott Oconnor is a 75 y.o. male admitted on 06/18/2020 with HCAP.    Pt is COVID+ with worsening respiratory symptoms and now with PCT 8.03 and Tmax 24 of 102F.  HD catheter placement 2/1 for backup hemodialysis for the next few weeks off and on  Pharmacy has been consulted for Vancomycin/Zosyn dosing.  Plan: Will start Zosyn 2.25g q8h  Will give vancomycin IV loading dose '1000mg'$  and check MRSA PCR   Height: '5\' 10"'$  (177.8 cm) Weight: 55.2 kg (121 lb 12.8 oz) IBW/kg (Calculated) : 73  Temp (24hrs), Avg:100 F (37.8 C), Min:98.1 F (36.7 C), Max:102 F (38.9 C)  Recent Labs  Lab 06/22/20 0510 06/23/20 0559 06/24/20 0619 06/24/20 2159 06/24/20 2201 06/25/20 0043 06/25/20 0759  WBC 7.9 9.2 9.2 9.9  --   --  9.8  CREATININE 15.67* 15.88* 16.30* 8.46*  --   --  9.52*  LATICACIDVEN  --   --   --   --  1.5 1.2  --     Estimated Creatinine Clearance: 5.4 mL/min (A) (by C-G formula based on SCr of 9.52 mg/dL (H)).    Allergies  Allergen Reactions  . Ivp Dye [Iodinated Diagnostic Agents]     ESRD patient     Antimicrobials this admission: Vancomycin 2/3 >> Zosyn 2/3 >>  Dose adjustments this admission: N/A  Microbiology results: 2/2 BCx: pending  2/3 MRSA PCR: pending  Thank you for allowing pharmacy to be a part of this patient's care.  Lu Duffel, PharmD, BCPS Clinical Pharmacist 06/25/2020 10:24 AM

## 2020-06-25 NOTE — TOC Progression Note (Addendum)
Transition of Care [facility]) - Progression Note    Patient Details  Name: Scott Oconnor MRN: [id number] Date of Birth: 01-26-1946  Transition of Care [facility]) CM/SW [place], LCSW Phone Number: 06/25/2020, 10:54 AM  Clinical Narrative:   [facility] unable to accept patient for Home Health. Reached out to [facility], [facility], [facility], [facility], and [facility].  1:10- [facility] accepted for Home Health services- PT, RN, Aide. Updated patient's daughter.  Expected Discharge Plan: [place] Barriers to Discharge: Continued Medical Work up  Expected Discharge Plan and Services Expected Discharge Plan: [place] arrangements for the past 2 months: Single Family Home Expected Discharge Date: 06/22/20                         [facility] Arranged: RN,PT,Nurse's Aide           Social Determinants of Health (SDOH) Interventions    Readmission Risk Interventions Readmission Risk Prevention Plan 06/24/2020  Transportation Screening Complete  Medication Review Press photographer) Complete  PCP or Specialist appointment within 3-5 days of discharge Complete  HRI or Home Care Consult Complete  SW Recovery Care/Counseling Consult Complete  Palliative Care Screening Not [place] Complete  Some recent data might be hidden

## 2020-06-25 NOTE — Progress Notes (Signed)
Patient ID: Scott Oconnor, male   DOB: [birth year], 75 y.o.   MRN: [id number] [facility] PROGRESS NOTE  Scott Oconnor [id number] DOB: 1945/06/20 DOA: 06/18/2020 PCP: [name], MD  HPI/Subjective: Daughter states he is doing a little bit better today but did have high fever last night.  Still having some cough.  When I saw him he was sitting up in the chair and was nauseous.  Nursing staff had to give him some Zofran.  Admitted 127 with Covid infection and acute blood loss anemia.  Objective: Vitals:   06/25/20 1300 06/25/20 1315  BP: 118/72 123/66  Pulse:  81  Resp: 17 16  Temp:    SpO2:      Filed Weights   06/18/20 1550 06/20/20 0519 06/22/20 0458  Weight: 52 kg 55.1 kg 55.2 kg    ROS: Review of Systems  Respiratory: Positive for cough. Negative for shortness of breath.   Cardiovascular: Negative for chest pain.  Gastrointestinal: Positive for nausea. Negative for abdominal pain and vomiting.   Exam: Physical Exam HENT:     Head: Normocephalic.     Mouth/Throat:     Pharynx: No oropharyngeal exudate.  Eyes:     General: Lids are normal.     Conjunctiva/sclera: Conjunctivae normal.  Cardiovascular:     Rate and Rhythm: Normal rate and regular rhythm.     Heart sounds: Normal heart sounds, S1 normal and S2 normal.  Pulmonary:     Breath sounds: Examination of the right-lower field reveals decreased breath sounds and rhonchi. Examination of the left-lower field reveals decreased breath sounds and rhonchi. Decreased breath sounds and rhonchi present. No wheezing or rales.  Abdominal:     Palpations: Abdomen is soft.     Tenderness: There is no abdominal tenderness.  Musculoskeletal:     Right lower leg: No swelling.     Left lower leg: No swelling.  Skin:    General: Skin is warm.     Findings: No rash.  Neurological:     Mental Status: He is alert.     Comments: Answers some yes or no questions.       Data Reviewed: Basic Metabolic  Panel: Recent Labs  Lab 06/21/20 0416 06/22/20 0510 06/23/20 0559 06/24/20 0619 06/24/20 2159 06/25/20 0759  NA 139 140 143 140 136 137  K 4.8 4.3 4.5 4.3 3.8 4.3  CL 98 99 97* 98 95* 95*  CO2 19* 19* 22 20* 21* 22  GLUCOSE 116* 158* 132* 118* 87 83  BUN 128* 122* 116* 109* 51* 60*  CREATININE 15.49* 15.67* 15.88* 16.30* 8.46* 9.52*  CALCIUM 7.6* 7.6* 7.7* 7.1* 7.6* 7.6*  PHOS 9.5*  --  9.1* 10.0*  --   --    Liver Function Tests: Recent Labs  Lab 06/18/20 1736 06/20/20 0521 06/21/20 0416 06/22/20 0510 06/23/20 0559 06/24/20 0619 06/24/20 2159  AST 35 33  --  31  --   --  62*  ALT 13 11  --  9  --   --  10  ALKPHOS 39 43  --  42  --   --  44  BILITOT 0.9 0.8  --  0.8  --   --  1.1  PROT 6.4* 6.0*  --  5.7*  --   --  5.9*  ALBUMIN 2.9* 2.8* 2.6* 2.5* 2.9* 2.5* 2.6*   CBC: Recent Labs  Lab 06/22/20 0510 06/23/20 0559 06/24/20 0619 06/24/20 2159 06/25/20 0759  WBC 7.9 9.2 9.2 9.9 9.8  NEUTROABS  --   --   --  8.7*  --   HGB 8.0* 8.1* 6.9* 8.0* 8.1*  HCT 23.3* 23.9* 20.6* 23.8* 24.0*  MCV 96.7 97.2 97.6 99.2 98.0  PLT 93* 109* 98* 100* 112*   BNP (last 3 results) Recent Labs    09/29/19 0642 11/18/19 2118 05/06/20 0214  BNP 1,195.0* >4,500.0* >4,500.0*    CBG: Recent Labs  Lab 06/23/20 0636  GLUCAP 111*    Recent Results (from the past 240 hour(s))  Culture, blood (x 2)     Status: None (Preliminary result)   Collection Time: 06/24/20 10:00 PM   Specimen: BLOOD  Result Value Ref Range Status   Specimen Description BLOOD BLOOD LEFT HAND  Final   Special Requests   Final    BOTTLES DRAWN AEROBIC ONLY Blood Culture results may not be optimal due to an inadequate volume of blood received in culture bottles   Culture   Final    NO GROWTH < 12 HOURS Performed at [facility], [address] [place], [place] [address]    Report Status PENDING  Incomplete  Culture, blood (x 2)     Status: None (Preliminary result)   Collection Time:  06/24/20 10:01 PM   Specimen: BLOOD  Result Value Ref Range Status   Specimen Description BLOOD BLOOD RIGHT HAND  Final   Special Requests   Final    BOTTLES DRAWN AEROBIC ONLY Blood Culture results may not be optimal due to an inadequate volume of blood received in culture bottles   Culture   Final    NO GROWTH < 12 HOURS Performed at [facility], [address], [place], [place] [address]    Report Status PENDING  Incomplete     Studies: PERIPHERAL VASCULAR CATHETERIZATION  Result Date: 06/24/2020 See op note  DG Chest Port 1 View  Result Date: 06/25/2020 CLINICAL DATA:  GI bleed.  COVID. EXAM: PORTABLE CHEST 1 VIEW COMPARISON:  Chest x-ray 06/18/2020.  CT abdomen 06/18/2020. FINDINGS: Interim removal of right IJ line. Interim placement of left IJ dual-lumen catheter. Tip over cavoatrial junction. Cardiomegaly. Diffuse bilateral interstitial prominence consistent interstitial edema and or pneumonitis. Dense left base atelectasis/consolidation. Small left pleural effusion. No pneumothorax. Aortic stent graft noted over the upper abdomen. Catheter noted over the right upper quadrant. Pneumoperitoneum again noted. IMPRESSION: 1. Interim removal of right IJ line. Interim placement of left IJ dual-lumen catheter. Tip over cavoatrial junction. 2. Cardiomegaly. Diffuse bilateral interstitial prominence consistent with interstitial edema and or pneumonitis. Dense left base atelectasis/consolidation. 3. Pneumoperitoneum again noted. Electronically Signed   By: Marcello Moores  Register   On: 06/25/2020 08:50    Scheduled Meds: . sodium chloride   Intravenous Once  . atorvastatin  40 mg Oral Daily  . benzonatate  100 mg Oral Q8H  . carvedilol  6.25 mg Oral BID WC  . Chlorhexidine Gluconate Cloth  6 each Topical Daily  . epoetin (EPOGEN/PROCRIT) injection  20,000 Units Subcutaneous Weekly  . gentamicin cream  1 application Topical Daily  . guaiFENesin  600 mg Oral BID  . methylPREDNISolone  (SOLU-MEDROL) injection  40 mg Intravenous Daily  . multivitamin  1 tablet Oral QHS  . pantoprazole  40 mg Intravenous Q12H  . polyethylene glycol  17 g Oral Daily  . vancomycin variable dose per unstable renal function (pharmacist dosing)   Does not apply See admin instructions   Continuous Infusions: . dialysis solution 1.5% low-MG/low-CA    . piperacillin-tazobactam (ZOSYN)  IV    . vancomycin      Assessment/Plan:  1. Left lobar pneumonia, sepsis last night while here with fever, tachycardia and tachypnea.  Start vancomycin and Zosyn pharmacy consult to dose these medications.  Follow-up blood cultures. 2. Acute blood loss anemia.  The patient's hemoglobin did come up after dialysis without transfusion and since patient had a fever transfusion was canceled.  Hold off for right now. 3. Covid positive infection.  We will still hold off on remdesivir as per family but will start Solu-Medrol 4. Acute toxic metabolic encephalopathy secondary to uremia.  Hemodialysis yesterday and again today 5. Chronic systolic congestive heart failure.  Dialysis to manage fluid on Coreg 6. Hyperlipidemia unspecified.  Continue atorvastatin 7. Essential hypertension.  Continue Coreg 8. End-stage renal disease.  Dialysis again today 9. Physical therapy recommending home with home health      Code Status:     Code Status Orders  (From admission, onward)         Start     Ordered   06/18/20 2122  Full code  Continuous        06/18/20 2123        Code Status History    Date Active Date Inactive Code Status Order ID Comments User Context   05/06/2020 0355 05/06/2020 2301 Full Code [id number]  Athena Masse, MD ED   04/10/2020 1913 04/11/2020 2230 Full Code [id number]  Cox, Amy N, DO ED   11/19/2019 0051 11/23/2019 2133 Full Code [id number]  Nelle Don, MD Inpatient   09/20/2019 1416 09/30/2019 2350 Full Code [id number]  Loletha Grayer, MD ED   08/14/2019 1132 08/15/2019 2113 Full Code [id number]   Schnier, Dolores Lory, MD Inpatient   Advance Care Planning Activity     Family Communication: Spoke with daughter at the bedside Disposition Plan: Status is: Inpatient  Dispo: The patient is from: Home              Anticipated d/c is to: Home with home health              Anticipated d/c date is: Likely 3 days or so              Patient currently requiring IV antibiotics for new fever and left lower lobe pneumonia   Difficult to place patient.  No since will be able to go home with home health  Time spent: 29 minutes  [place]

## 2020-06-25 NOTE — Sepsis Progress Note (Signed)
Notified provider of need to order antibiotics.   

## 2020-06-25 NOTE — Progress Notes (Addendum)
Axillary temp decreased to 99.8. Pt looks comfortable. Per Dr. Damita Dunnings, blood is not warranted at this time due to Hgb of 8.0

## 2020-06-25 NOTE — Evaluation (Signed)
Physical Therapy Evaluation Patient Details Name: Scott Oconnor MRN: [id number] DOB: 18-Apr-1946 Today's Date: 06/25/2020   History of Present Illness  Scott Oconnor is a 75yo male comes to ED on 1/27 after several days SOB, family concerned about pt having COVID. DTR reports cough, N/V, back pain. Pt recently transitioned from HD to PD without any issues (PTA), but has been receiving HD while admitted with plans to resume PD at DC. ESRD on PD, HTN, PVD, HLD, PAD on eliquis, AAA s/ repair, CHF, Labs reveal Hb: 7.3 (10.5 ten days prior), guaiac positive stool. Pt has been febrile days prior to PT evaluation.  Clinical Impression  Pt admitted with above diagnosis. Pt currently with functional limitations due to the deficits listed below (see "PT Problem List"). Upon entry, pt in guest chair, awake and minimally agreeable to participate due to nausea. DTR is in room, recently finished mobilizing patient and gives details. The pt is alert, pleasant, interactive, and able to provide info regarding prior level of function, both in tolerance and independence, however some info is taken from DTR as pt does not feel well at this time, awaiting antiemetics. minGuard to minA for bed mobility and transfers, no extension AMB as of yet- deferred to later date.   Patient's performance this date reveals decreased ability, independence, and tolerance in performing all basic mobility required for performance of activities of daily living. Pt requires additional DME, close physical assistance, and cues for safe participate in mobility. Pt will benefit from skilled PT intervention to increase independence and safety with basic mobility in preparation for discharge to the venue listed below.       Follow Up Recommendations Home health PT;Supervision for mobility/OOB    Equipment Recommendations  None recommended by PT    Recommendations for Other Services       Precautions / Restrictions  Precautions Precautions: Fall Restrictions Weight Bearing Restrictions: No      Mobility  Bed Mobility Overal bed mobility: Needs Assistance Bed Mobility: Supine to Sit;Sit to Supine     Supine to sit: Min assist Sit to supine: Min assist   General bed mobility comments: DTR Scott Oconnor assisting    Transfers Overall transfer level: Needs assistance Equipment used: Rolling walker (2 wheeled) Transfers: Sit to/from Stand Sit to Stand: Min guard;Min assist         General transfer comment: DTR Scott Oconnor assisting; pt toelrated standing in place for several minutes, but nausea progressively grew worse.  Ambulation/Gait Ambulation/Gait assistance:  (deferred at this time 2/2 nausea. MD has placed antiemetic orders, but meds not yet received.)              Stairs            Wheelchair Mobility    Modified Rankin (Stroke Patients Only)       Balance Overall balance assessment: Mild deficits observed, not formally tested;Modified Independent                                           Pertinent Vitals/Pain Pain Assessment: No/denies pain    Home Living Family/patient expects to be discharged to:: Private residence Living Arrangements: Children;Spouse/significant other Available Help at Discharge: Family;Available 24 hours/day Type of Home: House Home Access: Stairs to enter Entrance Stairs-Rails: Can reach both;Left;Right Entrance Stairs-Number of Steps: 2 Home Layout: Two level;Able to live on main level with bedroom/bathroom Home Equipment:  Walker - 2 wheels Additional Comments: prn O2 at home (uses when exercises, or after dialysis)    Prior Function                 Hand Dominance   Dominant Hand: Right    Extremity/Trunk Assessment   Upper Extremity Assessment Upper Extremity Assessment: Generalized weakness;Overall [facility] for tasks assessed    Lower Extremity Assessment Lower Extremity Assessment: Generalized  weakness;Overall WFL for tasks assessed       Communication      Cognition Arousal/Alertness: Awake/alert Behavior During Therapy: WFL for tasks assessed/performed Overall Cognitive Status: Within Functional Limits for tasks assessed                                        General Comments      Exercises     Assessment/Plan    PT Assessment Patient needs continued PT services  PT Problem List Decreased strength;Decreased range of motion;Decreased activity tolerance;Decreased balance;Decreased mobility;Decreased knowledge of precautions       PT Treatment Interventions Balance training;DME instruction;Gait training;Stair training;Functional mobility training;Therapeutic activities;Therapeutic exercise    PT Goals (Current goals can be found in the Care Plan section)  Acute Rehab PT Goals Patient Stated Goal: not be nauseated PT Goal Formulation: With patient Time For Goal Achievement: 07/09/20 Potential to Achieve Goals: Fair    Frequency Min 2X/week   Barriers to discharge   ample family support    Co-evaluation               AM-PAC PT "6 Clicks" Mobility  Outcome Measure Help needed turning from your back to your side while in a flat bed without using bedrails?: A Little Help needed moving from lying on your back to sitting on the side of a flat bed without using bedrails?: A Little Help needed moving to and from a bed to a chair (including a wheelchair)?: A Little Help needed standing up from a chair using your arms (e.g., wheelchair or bedside chair)?: A Little Help needed to walk in hospital room?: A Little Help needed climbing 3-5 steps with a railing? : A Little 6 Click Score: 18    End of Session Equipment Utilized During Treatment: Gait belt Activity Tolerance: No increased pain;Other (comment) (nausea) Patient left: in chair;with family/visitor present;with call bell/phone within reach Nurse Communication: Mobility status PT Visit  Diagnosis: Difficulty in walking, not elsewhere classified (R26.2);Muscle weakness (generalized) (M62.81);Other symptoms and signs involving the nervous system (R29.898)    Time: [id number] PT Time Calculation (min) (ACUTE ONLY): 11 min   Charges:   PT Evaluation $PT Eval Moderate Complexity: 1 Mod          11:02 AM, 06/25/20 Scott Grandchild, PT, DPT Physical Therapist - [facility]  [phone] ([place])    Gary C 06/25/2020, 10:59 AM

## 2020-06-25 NOTE — Progress Notes (Signed)
[facility]  ROUNDING NOTE   Subjective:   Mr. Garnet Zamorski was admitted to [facility] on 06/18/2020 for Acute GI bleeding [K92.2] Generalized weakness [R53.1] Gastrointestinal hemorrhage, unspecified gastrointestinal hemorrhage type [K92.2] COVID-19 [U07.1] COVID-19+  He is seen in hemodialysis today.  He is not responsive to name, but squeezes hands when asked Maintains oral airway    HEMODIALYSIS FLOWSHEET:  Blood Flow Rate (mL/min): 300 mL/min Arterial Pressure (mmHg): -130 mmHg Venous Pressure (mmHg): 80 mmHg Transmembrane Pressure (mmHg): 60 mmHg Ultrafiltration Rate (mL/min): 200 mL/min Dialysate Flow Rate (mL/min): 500 ml/min Conductivity: Machine : 13.8 Conductivity: Machine : 13.8 Dialysis Fluid Bolus: Normal Saline Bolus Amount (mL): 200 mL Dialysate Change: Other (comment) (3K 2.5 Ca started at initation of treatment)    Objective:  Vital signs in last 24 hours:  Temp:  [98.1 F (36.7 C)-102 F (38.9 C)] 100 F (37.8 C) (02/03 0810) Pulse Rate:  [60-104] 81 (02/03 1315) Resp:  [10-22] 16 (02/03 1315) BP: (105-173)/(55-95) 123/66 (02/03 1315) SpO2:  [95 %-100 %] 96 % (02/03 0332)  Weight change:  Filed Weights   06/18/20 1550 06/20/20 0519 06/22/20 0458  Weight: 52 kg 55.1 kg 55.2 kg    Intake/Output: I/O last 3 completed shifts: In: -  Out: 1 [Other:1]   Intake/Output this shift:  No intake/output data recorded.  Physical Exam: General: NAD, laying in bed, appears weak, frail  Head: Normocephalic, atraumatic. Moist oral mucosal membranes  Lungs:  Clear to auscultation  Heart: Regular rate and rhythm  Abdomen:  Soft, nontender, +sutures  Extremities:  no peripheral edema.  Neurologic:  Resting quietly, able to follow simple commands  Skin: No lesions  Access: PD catheter, left IJ PermCath    Basic Metabolic Panel: Recent Labs  Lab 06/21/20 0416 06/22/20 0510 06/23/20 0559 06/24/20 0619 06/24/20 2159 06/25/20 0759  NA  139 140 143 140 136 137  K 4.8 4.3 4.5 4.3 3.8 4.3  CL 98 99 97* 98 95* 95*  CO2 19* 19* 22 20* 21* 22  GLUCOSE 116* 158* 132* 118* 87 83  BUN 128* 122* 116* 109* 51* 60*  CREATININE 15.49* 15.67* 15.88* 16.30* 8.46* 9.52*  CALCIUM 7.6* 7.6* 7.7* 7.1* 7.6* 7.6*  PHOS 9.5*  --  9.1* 10.0*  --   --     Liver Function Tests: Recent Labs  Lab 06/18/20 1736 06/20/20 0521 06/21/20 0416 06/22/20 0510 06/23/20 0559 06/24/20 0619 06/24/20 2159  AST 35 33  --  31  --   --  62*  ALT 13 11  --  9  --   --  10  ALKPHOS 39 43  --  42  --   --  44  BILITOT 0.9 0.8  --  0.8  --   --  1.1  PROT 6.4* 6.0*  --  5.7*  --   --  5.9*  ALBUMIN 2.9* 2.8* 2.6* 2.5* 2.9* 2.5* 2.6*   No results for input(s): LIPASE, AMYLASE in the last 168 hours. No results for input(s): AMMONIA in the last 168 hours.  CBC: Recent Labs  Lab 06/22/20 0510 06/23/20 0559 06/24/20 0619 06/24/20 2159 06/25/20 0759  WBC 7.9 9.2 9.2 9.9 9.8  NEUTROABS  --   --   --  8.7*  --   HGB 8.0* 8.1* 6.9* 8.0* 8.1*  HCT 23.3* 23.9* 20.6* 23.8* 24.0*  MCV 96.7 97.2 97.6 99.2 98.0  PLT 93* 109* 98* 100* 112*    Cardiac Enzymes: No results for input(s):  CKTOTAL, CKMB, CKMBINDEX, TROPONINI in the last 168 hours.  BNP: Invalid input(s): POCBNP  CBG: Recent Labs  Lab 06/23/20 0636  GLUCAP 111*    Microbiology: Results for orders placed or performed during the hospital encounter of 06/18/20  Culture, blood (x 2)     Status: None (Preliminary result)   Collection Time: 06/24/20 10:00 PM   Specimen: BLOOD  Result Value Ref Range Status   Specimen Description BLOOD BLOOD LEFT HAND  Final   Special Requests   Final    BOTTLES DRAWN AEROBIC ONLY Blood Culture results may not be optimal due to an inadequate volume of blood received in culture bottles   Culture   Final    NO GROWTH < 12 HOURS Performed at [facility], [address] [place], [place] [address]    Report Status PENDING  Incomplete  Culture,  blood (x 2)     Status: None (Preliminary result)   Collection Time: 06/24/20 10:01 PM   Specimen: BLOOD  Result Value Ref Range Status   Specimen Description BLOOD BLOOD RIGHT HAND  Final   Special Requests   Final    BOTTLES DRAWN AEROBIC ONLY Blood Culture results may not be optimal due to an inadequate volume of blood received in culture bottles   Culture   Final    NO GROWTH < 12 HOURS Performed at [facility], [address] [place], [place] [address]    Report Status PENDING  Incomplete    Coagulation Studies: Recent Labs    06/24/20 08-10-2157  LABPROT 14.2  INR 1.1    Urinalysis: No results for input(s): COLORURINE, LABSPEC, PHURINE, GLUCOSEU, HGBUR, BILIRUBINUR, KETONESUR, PROTEINUR, UROBILINOGEN, NITRITE, LEUKOCYTESUR in the last 72 hours.  Invalid input(s): APPERANCEUR    Imaging: PERIPHERAL VASCULAR CATHETERIZATION  Result Date: 06/24/2020 See op note  DG Chest Port 1 View  Result Date: 06/25/2020 CLINICAL DATA:  GI bleed.  COVID. EXAM: PORTABLE CHEST 1 VIEW COMPARISON:  Chest x-ray 06/18/2020.  CT abdomen 06/18/2020. FINDINGS: Interim removal of right IJ line. Interim placement of left IJ dual-lumen catheter. Tip over cavoatrial junction. Cardiomegaly. Diffuse bilateral interstitial prominence consistent interstitial edema and or pneumonitis. Dense left base atelectasis/consolidation. Small left pleural effusion. No pneumothorax. Aortic stent graft noted over the upper abdomen. Catheter noted over the right upper quadrant. Pneumoperitoneum again noted. IMPRESSION: 1. Interim removal of right IJ line. Interim placement of left IJ dual-lumen catheter. Tip over cavoatrial junction. 2. Cardiomegaly. Diffuse bilateral interstitial prominence consistent with interstitial edema and or pneumonitis. Dense left base atelectasis/consolidation. 3. Pneumoperitoneum again noted. Electronically Signed   By: Marcello Moores  Register   On: 06/25/2020 08:50     Medications:   .  dialysis solution 1.5% low-MG/low-CA    . piperacillin-tazobactam (ZOSYN)  IV    . vancomycin     . sodium chloride   Intravenous Once  . atorvastatin  40 mg Oral Daily  . benzonatate  100 mg Oral Q8H  . carvedilol  6.25 mg Oral BID WC  . Chlorhexidine Gluconate Cloth  6 each Topical Daily  . epoetin (EPOGEN/PROCRIT) injection  20,000 Units Subcutaneous Weekly  . gentamicin cream  1 application Topical Daily  . guaiFENesin  600 mg Oral BID  . methylPREDNISolone (SOLU-MEDROL) injection  40 mg Intravenous Daily  . multivitamin  1 tablet Oral QHS  . pantoprazole  40 mg Intravenous Q12H  . polyethylene glycol  17 g Oral Daily  . vancomycin variable dose per unstable renal function (pharmacist dosing)  Does not apply See admin instructions   acetaminophen, acetaminophen, calcium carbonate, guaiFENesin-dextromethorphan, HYDROmorphone (DILAUDID) injection, loperamide, ondansetron (ZOFRAN) IV, ondansetron (ZOFRAN) IV  Assessment/ Plan:  Mr. Tricia Guilbeau is a 75 y.o. [place]  male with end stage renal disease on peritoneal dialysis, hypertension, AAA repair, congestive heart failure, peripheral vascular disease, raynaud's syndrome, hyperlipidemia who is admitted to [facility] on 06/18/2020 for Acute GI bleeding [K92.2] Generalized weakness [R53.1] Gastrointestinal hemorrhage, unspecified gastrointestinal hemorrhage type [K92.2] COVID-19 [U07.1]  [facility] 52.5kg CCPD 9 hours 5 exchanges 206m fills  1. End Stage Renal Disease:  -Hemodialysis today for uremia -Continue CCPD once home HD during hospital stay  2. Anemia with chronic kidney disease and acute blood loss anemia from GI bleed.  Lab Results  Component Value Date   HGB 8.1 (L) 06/25/2020    - Low threshold for transfusion - PPI -Getting EPO SQ weekly 20,000 units  3. Hypertension:Home regimen of irbesartan, hydralazine, and carvedilol.  -Blood pressure within normal limits Use 1.5% dialysate  bags Torsemide on hold at present  4. Secondary Hyperparathyroidism: with hyperphosphatemia. PTH 390 on 06/04/20 as outpatient.  - calcium carbonate with meals.   5.  Covid-19 positive pneumonia Tested positive on June 18, 2020   LOS: 7 Yacqub Baston SCandiss Norse2/3/20221:47 PM

## 2020-06-26 DIAGNOSIS — Z515 Encounter for palliative care: Secondary | ICD-10-CM | POA: Diagnosis not present

## 2020-06-26 DIAGNOSIS — D62 Acute posthemorrhagic anemia: Secondary | ICD-10-CM | POA: Diagnosis not present

## 2020-06-26 DIAGNOSIS — A419 Sepsis, unspecified organism: Secondary | ICD-10-CM | POA: Diagnosis not present

## 2020-06-26 DIAGNOSIS — J181 Lobar pneumonia, unspecified organism: Secondary | ICD-10-CM | POA: Diagnosis not present

## 2020-06-26 DIAGNOSIS — Z7189 Other specified counseling: Secondary | ICD-10-CM | POA: Diagnosis not present

## 2020-06-26 DIAGNOSIS — U071 COVID-19: Secondary | ICD-10-CM | POA: Diagnosis not present

## 2020-06-26 DIAGNOSIS — G9340 Encephalopathy, unspecified: Secondary | ICD-10-CM

## 2020-06-26 DIAGNOSIS — R6520 Severe sepsis without septic shock: Secondary | ICD-10-CM

## 2020-06-26 LAB — BASIC METABOLIC PANEL
Anion gap: 18 — ABNORMAL HIGH (ref 5–15)
BUN: 48 mg/dL — ABNORMAL HIGH (ref 8–23)
CO2: 23 mmol/L (ref 22–32)
Calcium: 8.1 mg/dL — ABNORMAL LOW (ref 8.9–10.3)
Chloride: 93 mmol/L — ABNORMAL LOW (ref 98–111)
Creatinine, Ser: 6.39 mg/dL — ABNORMAL HIGH (ref 0.61–1.24)
GFR, Estimated: 9 mL/min — ABNORMAL LOW (ref >60)
Glucose, Bld: 168 mg/dL — ABNORMAL HIGH (ref 70–99)
Potassium: 4.9 mmol/L (ref 3.5–5.1)
Sodium: 134 mmol/L — ABNORMAL LOW (ref 135–145)

## 2020-06-26 LAB — CBC
HCT: 23.1 % — ABNORMAL LOW (ref 39.0–52.0)
Hemoglobin: 7.7 g/dL — ABNORMAL LOW (ref 13.0–17.0)
MCH: 32.6 pg (ref 26.0–34.0)
MCHC: 33.3 g/dL (ref 30.0–36.0)
MCV: 97.9 fL (ref 80.0–100.0)
Platelets: 125 10*3/uL — ABNORMAL LOW (ref 150–400)
RBC: 2.36 MIL/uL — ABNORMAL LOW (ref 4.22–5.81)
RDW: 15.8 % — ABNORMAL HIGH (ref 11.5–15.5)
WBC: 5.5 10*3/uL (ref 4.0–10.5)
nRBC: 0.4 % — ABNORMAL HIGH (ref 0.0–0.2)

## 2020-06-26 LAB — C-REACTIVE PROTEIN: CRP: 27.6 mg/dL — ABNORMAL HIGH (ref <1.0)

## 2020-06-26 LAB — PREPARE RBC (CROSSMATCH)

## 2020-06-26 MED ORDER — ACETAMINOPHEN 10 MG/ML IV SOLN
1000.0000 mg | Freq: Three times a day (TID) | INTRAVENOUS | Status: AC | PRN
  Administered 2020-06-26 – 2020-06-27 (×2): 1000 mg via INTRAVENOUS
  Filled 2020-06-26 (×3): qty 100

## 2020-06-26 MED ORDER — ACETAMINOPHEN 325 MG PO TABS: 650.0000 mg | ORAL_TABLET | Freq: Three times a day (TID) | ORAL | Status: AC | PRN

## 2020-06-26 MED ORDER — LIDOCAINE VISCOUS HCL 2 % MT SOLN
15.0000 mL | OROMUCOSAL | Status: DC | PRN
  Filled 2020-06-26: qty 15

## 2020-06-26 MED ORDER — ACETAMINOPHEN 500 MG PO TABS: 500.0000 mg | ORAL_TABLET | Freq: Four times a day (QID) | ORAL | Status: DC | PRN

## 2020-06-26 MED ORDER — ACETAMINOPHEN 650 MG RE SUPP: 650.0000 mg | Freq: Three times a day (TID) | RECTAL | Status: AC | PRN

## 2020-06-26 MED ORDER — ACETAMINOPHEN 650 MG RE SUPP
650.0000 mg | Freq: Three times a day (TID) | RECTAL | Status: DC | PRN
  Filled 2020-06-26: qty 1

## 2020-06-26 MED ORDER — CARVEDILOL 3.125 MG PO TABS
3.1250 mg | ORAL_TABLET | Freq: Two times a day (BID) | ORAL | Status: DC
Start: 2020-06-26 — End: 2020-06-27
  Administered 2020-06-26: 19:00:00 3.125 mg via ORAL
  Filled 2020-06-26: qty 1

## 2020-06-26 MED ORDER — SODIUM CHLORIDE 0.9 % IV SOLN: INTRAVENOUS | Status: DC

## 2020-06-26 NOTE — Progress Notes (Signed)
   06/24/20 2115  Assess: MEWS Score  Temp (!) 102 F (38.9 C)  BP (!) 120/55  Pulse Rate 98  Resp (!) 22  Level of Consciousness Alert  SpO2 100 %  O2 Device Nasal Cannula  O2 Flow Rate (L/min) 3 L/min  Assess: MEWS Score  MEWS Temp 2  MEWS Systolic 0  MEWS Pulse 0  MEWS RR 1  MEWS LOC 0  MEWS Score 3  MEWS Score Color Yellow  Assess: if the MEWS score is Yellow or Red  Were vital signs taken at a resting state? Yes  Focused Assessment No change from prior assessment  Early Detection of Sepsis Score *See Row Information* Medium  MEWS guidelines implemented *See Row Information* Yes  Treat  MEWS Interventions Administered scheduled meds/treatments;Other (Comment) (Dr Damita Dunnings notified, ordered sepsis work up)  Take Vital Signs  Increase Vital Sign Frequency  Yellow: Q 2hr X 2 then Q 4hr X 2, if remains yellow, continue Q 4hrs  Escalate  MEWS: Escalate Yellow: discuss with charge nurse/RN and consider discussing with provider and RRT  Notify: Charge Nurse/RN  Name of Charge Nurse/RN Notified Stanton Kidney, RN  Date Charge Nurse/RN Notified 06/24/20  Time Charge Nurse/RN Notified 2116  Notify: Provider  Provider Name/Title Dr Damita Dunnings  Date Provider Notified 06/24/20  Time Provider Notified 2115  Notification Type Page  Notification Reason Other (Comment) (yelow mews triggred by 102 temp)  Response See new orders  Date of Provider Response 06/24/20  Time of Provider Response 2116  Notify: Rapid Response  Name of Rapid Response RN Notified Dylan, RN  Date Rapid Response Notified 06/24/20  Time Rapid Response Notified 2125

## 2020-06-26 NOTE — Progress Notes (Signed)
[place], [place] 06/26/20  Subjective:   LOS: 8  [name] was admitted to [facility] on 05/18/21 for Acute GI bleed, generalized weakness, and GI hemorrhage. Covid -51 +  Daughter at bedside.  Patient is alert and able to open eyes. Nods to questions. Poor appetite IVF to prevent dehydration.    Objective:  Vital signs in last 24 hours:  Temp:  [97.9 F (36.6 C)-100.3 F (37.9 C)] 97.9 F (36.6 C) (02/04 1228) Pulse Rate:  [64-93] 66 (02/04 1228) Resp:  [12-20] 16 (02/04 1228) BP: (107-173)/(55-89) 137/55 (02/04 1228) SpO2:  [93 %-100 %] 100 % (02/04 1228) Weight:  [54.9 kg] 54.9 kg (02/04 0500)  Weight change:  Filed Weights   06/20/20 0519 06/22/20 0458 06/26/20 0500  Weight: 55.1 kg 55.2 kg 54.9 kg    Intake/Output:    Intake/Output Summary (Last 24 hours) at 06/26/2020 1325 Last data filed at 06/25/2020 1345 Gross per 24 hour  Intake --  Output 1 ml  Net -1 ml     Physical Exam: General: Laying in bed  HEENT atraumatic  Pulm/lungs clear  CVS/Heart regular  Abdomen:  soft  Extremities: No peripheral edema  Neurologic: Resting quietly, alert  Skin: No lesions or rashes  Access: PD cath Left IJ Permcath       Basic Metabolic Panel:  Recent Labs  Lab 06/21/20 0416 06/22/20 0510 06/23/20 0559 06/24/20 0619 06/24/20 2159 06/25/20 0759 06/26/20 0536  NA 139   < > 143 140 136 137 134*  K 4.8   < > 4.5 4.3 3.8 4.3 4.9  CL 98   < > 97* 98 95* 95* 93*  CO2 19*   < > 22 20* 21* 22 23  GLUCOSE 116*   < > 132* 118* 87 83 168*  BUN 128*   < > 116* 109* 51* 60* 48*  CREATININE 15.49*   < > 15.88* 16.30* 8.46* 9.52* 6.39*  CALCIUM 7.6*   < > 7.7* 7.1* 7.6* 7.6* 8.1*  PHOS 9.5*  --  9.1* 10.0*  --   --   --    < > = values in this interval not displayed.     CBC: Recent Labs  Lab 06/23/20 0559 06/24/20 0619 06/24/20 2159 06/25/20 0759 06/26/20 0536  WBC 9.2 9.2 9.9 9.8 5.5  NEUTROABS  --   --  8.7*  --    --   HGB 8.1* 6.9* 8.0* 8.1* 7.7*  HCT 23.9* 20.6* 23.8* 24.0* 23.1*  MCV 97.2 97.6 99.2 98.0 97.9  PLT 109* 98* 100* 112* 125*      Lab Results  Component Value Date   HEPBSAG NON REACTIVE 05/06/2020   HEPBSAB NON REACTIVE 09/20/2019   HEPBIGM NON REACTIVE 09/20/2019      Microbiology:  Recent Results (from the past 240 hour(s))  Culture, blood (x 2)     Status: None (Preliminary result)   Collection Time: 06/24/20 10:00 PM   Specimen: BLOOD  Result Value Ref Range Status   Specimen Description BLOOD BLOOD LEFT HAND  Final   Special Requests   Final    BOTTLES DRAWN AEROBIC ONLY Blood Culture results may not be optimal due to an inadequate volume of blood received in culture bottles   Culture   Final    NO GROWTH 2 DAYS Performed at [facility], [address] [place], [place] [address]    Report Status PENDING  Incomplete  Culture, blood (x 2)  Status: None (Preliminary result)   Collection Time: 06/24/20 10:01 PM   Specimen: BLOOD  Result Value Ref Range Status   Specimen Description BLOOD BLOOD RIGHT HAND  Final   Special Requests   Final    BOTTLES DRAWN AEROBIC ONLY Blood Culture results may not be optimal due to an inadequate volume of blood received in culture bottles   Culture   Final    NO GROWTH 2 DAYS Performed at [facility], [address], [place], [place] [address]    Report Status PENDING  Incomplete  MRSA PCR Screening     Status: None   Collection Time: 06/25/20  6:27 PM   Specimen: Nasopharyngeal  Result Value Ref Range Status   MRSA by PCR NEGATIVE NEGATIVE Final    Comment:        The GeneXpert MRSA Assay (FDA approved for NASAL specimens only), is one component of a comprehensive MRSA colonization surveillance program. It is not intended to diagnose MRSA infection nor to guide or monitor treatment for MRSA infections. Performed at [facility], [address] [place], [place] [address]      Coagulation Studies: Recent Labs    06/24/20 September 03, 2157  LABPROT 14.2  INR 1.1    Urinalysis: No results for input(s): COLORURINE, LABSPEC, PHURINE, GLUCOSEU, HGBUR, BILIRUBINUR, KETONESUR, PROTEINUR, UROBILINOGEN, NITRITE, LEUKOCYTESUR in the last 72 hours.  Invalid input(s): APPERANCEUR    Imaging: DG Chest Port 1 View  Result Date: 06/25/2020 CLINICAL DATA:  GI bleed.  COVID. EXAM: PORTABLE CHEST 1 VIEW COMPARISON:  Chest x-ray 06/18/2020.  CT abdomen 06/18/2020. FINDINGS: Interim removal of right IJ line. Interim placement of left IJ dual-lumen catheter. Tip over cavoatrial junction. Cardiomegaly. Diffuse bilateral interstitial prominence consistent interstitial edema and or pneumonitis. Dense left base atelectasis/consolidation. Small left pleural effusion. No pneumothorax. Aortic stent graft noted over the upper abdomen. Catheter noted over the right upper quadrant. Pneumoperitoneum again noted. IMPRESSION: 1. Interim removal of right IJ line. Interim placement of left IJ dual-lumen catheter. Tip over cavoatrial junction. 2. Cardiomegaly. Diffuse bilateral interstitial prominence consistent with interstitial edema and or pneumonitis. Dense left base atelectasis/consolidation. 3. Pneumoperitoneum again noted. Electronically Signed   By: Marcello Moores  Register   On: 06/25/2020 08:50     Medications:   . sodium chloride 50 mL/hr at 06/26/20 0030  . dialysis solution 1.5% low-MG/low-CA    . piperacillin-tazobactam (ZOSYN)  IV 2.25 g (06/26/20 1039)   . sodium chloride   Intravenous Once  . atorvastatin  40 mg Oral Daily  . benzonatate  100 mg Oral Q8H  . carvedilol  3.125 mg Oral BID WC  . Chlorhexidine Gluconate Cloth  6 each Topical Daily  . epoetin (EPOGEN/PROCRIT) injection  20,000 Units Subcutaneous Weekly  . gentamicin cream  1 application Topical Daily  . guaiFENesin  600 mg Oral BID  . methylPREDNISolone (SOLU-MEDROL) injection  40 mg Intravenous Daily  . multivitamin  1 tablet  Oral QHS  . pantoprazole  40 mg Intravenous Q12H  . polyethylene glycol  17 g Oral Daily   acetaminophen, acetaminophen, calcium carbonate, guaiFENesin-dextromethorphan, lidocaine, loperamide, ondansetron (ZOFRAN) IV, ondansetron (ZOFRAN) IV  Assessment/ Plan:  75 y.o. male with  end stage renal disease on peritoneal dialysis, hypertension, AAA repair, congestive heart failure, peripheral vascular disease, raynaud's syndrome, hyperlipidemia  was admitted on 06/18/2020 for  Active Problems:   Essential hypertension   HFrEF (heart failure with reduced ejection fraction) (HCC)   End stage renal disease on dialysis ([place])  Acute blood loss anemia   [id number]   Toxic metabolic encephalopathy   Lobar pneumonia (HCC)   Sepsis with encephalopathy without septic shock (HCC)  Acute GI bleeding [K92.2] Generalized weakness [R53.1] Gastrointestinal hemorrhage, unspecified gastrointestinal hemorrhage type [K92.2] COVID-19 [U07.1]   [facility] 52.5kg CCPD 9 hours 5 exchanges 2064m fills  #. ESRD -Hemodialysis while in hospital -will continue PD after discharge -Electrolytes improving -Next HD treatment tomorrow   #. Anemia of CKD -IV PPI Lab Results  Component Value Date   HGB 7.7 (L) 06/26/2020   EPO 20,000u weekly   #. Secondary hyperparathyroidism of renal origin N 25.81   No results found for: PTH Lab Results  Component Value Date   PHOS 10.0 (H) 06/24/2020   Monitor calcium and phos level during this admission Calcium carbonate PRN with meals  #Hypertension: Home regimen of irbesartan, hydralazine, and carvedilol -BP within normal limits -use 1.5% dialysate bags  #Covid -19 positive pneumonia Tested positive on June 18, 2020   LOS: [place]2/4/20221:25 PM  CLaurel NWashburn I saw and evaluated the patient and [facility]  She assisted with the transcription of the  note.    HMurlean Iba, MD [facility] 2/4/20222:34 PM

## 2020-06-26 NOTE — Progress Notes (Signed)
Pts is afebrile at this time with an axillary temp of 98.6. Non-pharmacological   method was effective.

## 2020-06-26 NOTE — Evaluation (Signed)
Occupational Therapy Evaluation Patient Details Name: Scott Oconnor MRN: [id number] DOB: 1945/06/24 Today's Date: 06/26/2020    History of Present Illness Scott Oconnor is a 75yo male comes to ED on 1/27 after several days SOB, family concerned about pt having COVID. DTR reports cough, N/V, back pain. Pt recently transitioned from HD to PD without any issues (PTA), but has been receiving HD while admitted with plans to resume PD at DC. ESRD on PD, HTN, PVD, HLD, PAD on eliquis, AAA s/ repair, CHF, Labs reveal Hb: 7.3 (10.5 ten days prior), guaiac positive stool. Pt has been febrile days prior to PT evaluation.   Clinical Impression   Pt seen for OT evaluation this date in setting of hospitalization with COVID. Information obtained from pt's daughter who is present in the room as pt is currently a poor historian. She does endorse that he his soft spoken at baseline, but this date, pt offers no verbalizations to this author, only occasionally nods/shakes head in seemingly appropriate manner. [name] reports he is INDEP at baseline including walking, driving and performing all I/ADLs. This date, pt presents as grossly weak in trunk and limbs, but with appropriate ROM. In addition, pt with decreased cognitive status currently impacting his motor planning and ability to sequence steps of tasks. Pt requires moderate multimodal cues to participate in basic seated UB ADLs during this session as well as MIN/MOD A. Pt requires MAX A for LB ADLs to initiate and sequence, although he appears to demo appropriate ROM needed to participate in tasks effectively. Overall, pt is currently below his normal functional baseline per his daughter's report and will require skilled OT to continue both in the acute setting and upon d/c from acute setting. Will continue to follow. Pt left in chair with daughter present and all needs met and in reach.     Follow Up Recommendations  Home health  OT;Supervision/Assistance - 24 hour    Equipment Recommendations  3 in 1 bedside commode;Tub/shower seat    Recommendations for Other Services       Precautions / Restrictions Precautions Precautions: Fall Restrictions Weight Bearing Restrictions: No      Mobility Bed Mobility               General bed mobility comments: NT, pt up to chair with RN/dtr prior to session    Transfers                 General transfer comment: NT, pt up to chair with RN/dtr just prior to OT assessment and reluctant to get up again with OT's attempts.    Balance                                           ADL either performed or assessed with clinical judgement   ADL                                         General ADL Comments: requires MIN/MOD A and cues to sequence seated UB ADLs, does resist some  ADLs and it's unclear whether he cannot perform or is being "stubborn" as his daughter states. Pt requires MAX A For LB ADLs at this time. Appears to have appropriate range to perform (ex: thread socks) but unable to sequence  task and resistant to assist from OT.     Vision Baseline Vision/History: Wears glasses Wears Glasses: At all times Additional Comments: unable to formally assess d/t pt's cognition, but he seems to mostly appropriately track when he is visually attending. Pt with only MIN/MOD visual attention throughout session     Perception     Praxis      Pertinent Vitals/Pain Pain Assessment: Faces Faces Pain Scale: Hurts a little bit Pain Location: slight grimace with attempts to mobilize extremities, but mostly pleasant and tolerates well Pain Descriptors / Indicators: Grimacing Pain Intervention(s): Monitored during session     Hand Dominance Right   Extremity/Trunk Assessment Upper Extremity Assessment Upper Extremity Assessment: Overall WFL for tasks assessed;Generalized weakness (pt ROM appropriate for age, but with visible  atrophy and MMT is grossly 4-/5 throughout)   Lower Extremity Assessment Lower Extremity Assessment: Overall WFL for tasks assessed;Generalized weakness       Communication Communication Communication: Other (comment) (limited to no verbalizations with OT this date.)   Cognition Arousal/Alertness: Awake/alert Behavior During Therapy: WFL for tasks assessed/performed Overall Cognitive Status: Impaired/Different from baseline                                 General Comments: Pt is awake and smiles/nods occasionally, but is not communicative with this Chief Strategy Officer. He seemingly nods/shakes head appropriately with questions. Follows ~30-40% of simple one step commands with increased time and moderate multimodal cues.   General Comments       Exercises Other Exercises Other Exercises: OT facilitates ed with pt's dtr Tanya and pt re: role of OT in acute setting, importance of mobilizing. [name] with good understanding, pt with no evidence of learning/no verbal responses, but does seem to nod appropriately intermittently.   Shoulder Instructions      Home Living Family/patient expects to be discharged to:: Private residence Living Arrangements: Children;Spouse/significant other Available Help at Discharge: Family;Available 24 hours/day Type of Home: House Home Access: Stairs to enter [employer] of Steps: 2 Entrance Stairs-Rails: Can reach both;Left;Right Home Layout: Two level;Able to live on main level with bedroom/bathroom     Bathroom Shower/Tub: Occupational psychologist: Handicapped height     Home Equipment: Environmental consultant - 2 wheels   Additional Comments: prn O2 at home (uses when exercises, or after dialysis)      Prior Functioning/Environment Level of Independence: Needs assistance  Gait / Transfers Assistance Needed: Ambualtes around the hosue without assistive device. No falls in the last 12 months     Comments: Uses 0.5L O2 at baseline  on HD days prn        OT Problem List: Decreased strength;Decreased activity tolerance;Impaired balance (sitting and/or standing);Decreased coordination;Decreased cognition;Decreased safety awareness;Decreased knowledge of use of DME or AE;Pain      OT Treatment/Interventions: Self-care/ADL training;DME and/or AE instruction;Therapeutic activities;Balance training;Therapeutic exercise;Energy conservation;Patient/family education    OT Goals(Current goals can be found in the care plan section) Acute Rehab OT Goals Patient Stated Goal: regain baseline mentation, independence in ADL OT Goal Formulation: With family Time For Goal Achievement: 07/10/20 Potential to Achieve Goals: Fair ADL Goals Pt Will Perform Grooming: with set-up;sitting (with <25% multimodal cues to sequence) Pt Will Perform Upper Body Dressing: with supervision;sitting (with <25% multimodal cues to sequence) Pt Will Transfer to Toilet: with min assist;with mod assist;stand pivot transfer;bedside commode Pt/caregiver will Perform Home Exercise Program: Increased strength;Both right and left upper  extremity;With minimal assist  OT Frequency: Min 1X/week   Barriers to D/C:            Co-evaluation              AM-PAC OT "6 Clicks" Daily Activity     Outcome Measure Help from another person eating meals?: A Little Help from another person taking care of personal grooming?: A Lot Help from another person toileting, which includes using toliet, bedpan, or urinal?: A Lot Help from another person bathing (including washing, rinsing, drying)?: A Lot Help from another person to put on and taking off regular upper body clothing?: A Little Help from another person to put on and taking off regular lower body clothing?: A Lot 6 Click Score: 14   End of Session Nurse Communication: Mobility status  Activity Tolerance: Patient tolerated treatment well Patient left: in chair;with call bell/phone within reach;with chair  alarm set;with family/visitor present  OT Visit Diagnosis: Unsteadiness on feet (R26.81);Muscle weakness (generalized) (M62.81);Other symptoms and signs involving cognitive function                Time: 1435-1500 OT Time Calculation (min): 25 min Charges:  OT General Charges $OT Visit: 1 Visit OT Evaluation $OT Eval Moderate Complexity: 1 Mod OT Treatments $Self Care/Home Management : 8-22 mins  Gerrianne Scale, MS, OTR/L ascom [phone] 06/26/20, 6:22 PM

## 2020-06-26 NOTE — Progress Notes (Signed)
Patient ID: Scott Oconnor, male   DOB: 08-15-1945, 75 y.o.   MRN: [id number] [facility] PROGRESS NOTE  Eleftherios Blystone [id number] DOB: Dec 14, 1945 DOA: 06/18/2020 PCP: [name], MD  HPI/Subjective: Patient very slow with answers.  Answers some certain yes or no questions but mostly quiet.  Daughter doing much of the talking.  She feels that he is doing better today than yesterday.  She stated this morning he has been more alert.  Objective: Vitals:   06/26/20 1015 06/26/20 1228  BP: (!) 138/59 (!) 137/55  Pulse: 64 66  Resp: 15 16  Temp: 98.3 F (36.8 C) 97.9 F (36.6 C)  SpO2: 96% 100%    Filed Weights   06/20/20 0519 06/22/20 0458 06/26/20 0500  Weight: 55.1 kg 55.2 kg 54.9 kg    ROS: Review of Systems  Respiratory: Negative for shortness of breath.   Cardiovascular: Negative for chest pain.  Gastrointestinal: Negative for abdominal pain.   Exam: Physical Exam HENT:     Head: Normocephalic.     Mouth/Throat:     Pharynx: No oropharyngeal exudate.  Eyes:     General: Lids are normal.     Conjunctiva/sclera: Conjunctivae normal.  Cardiovascular:     Rate and Rhythm: Normal rate and regular rhythm.     Heart sounds: Normal heart sounds, S1 normal and S2 normal.  Pulmonary:     Breath sounds: Examination of the right-lower field reveals decreased breath sounds. Examination of the left-lower field reveals decreased breath sounds. Decreased breath sounds present. No wheezing or rhonchi.  Abdominal:     Palpations: Abdomen is soft.     Tenderness: There is no abdominal tenderness.  Musculoskeletal:     Right lower leg: No swelling.     Left lower leg: No swelling.  Skin:    General: Skin is warm.     Findings: No rash.  Neurological:     Mental Status: He is lethargic.     Comments: Answers a few questions       Data Reviewed: Basic Metabolic Panel: Recent Labs  Lab 06/21/20 0416 06/22/20 0510 06/23/20 0559 06/24/20 0619  06/24/20 2159 06/25/20 0759 06/26/20 0536  NA 139   < > 143 140 136 137 134*  K 4.8   < > 4.5 4.3 3.8 4.3 4.9  CL 98   < > 97* 98 95* 95* 93*  CO2 19*   < > 22 20* 21* 22 23  GLUCOSE 116*   < > 132* 118* 87 83 168*  BUN 128*   < > 116* 109* 51* 60* 48*  CREATININE 15.49*   < > 15.88* 16.30* 8.46* 9.52* 6.39*  CALCIUM 7.6*   < > 7.7* 7.1* 7.6* 7.6* 8.1*  PHOS 9.5*  --  9.1* 10.0*  --   --   --    < > = values in this interval not displayed.   Liver Function Tests: Recent Labs  Lab 06/20/20 0521 06/21/20 0416 06/22/20 0510 06/23/20 0559 06/24/20 0619 06/24/20 2159  AST 33  --  31  --   --  62*  ALT 11  --  9  --   --  10  ALKPHOS 43  --  42  --   --  44  BILITOT 0.8  --  0.8  --   --  1.1  PROT 6.0*  --  5.7*  --   --  5.9*  ALBUMIN 2.8* 2.6* 2.5* 2.9* 2.5* 2.6*   CBC: Recent Labs  Lab 06/23/20 0559 06/24/20 0619 06/24/20 2159 06/25/20 0759 06/26/20 0536  WBC 9.2 9.2 9.9 9.8 5.5  NEUTROABS  --   --  8.7*  --   --   HGB 8.1* 6.9* 8.0* 8.1* 7.7*  HCT 23.9* 20.6* 23.8* 24.0* 23.1*  MCV 97.2 97.6 99.2 98.0 97.9  PLT 109* 98* 100* 112* 125*   BNP (last 3 results) Recent Labs    09/29/19 0642 11/18/19 2118 05/06/20 0214  BNP 1,195.0* >4,500.0* >4,500.0*     CBG: Recent Labs  Lab 06/23/20 0636  GLUCAP 111*    Recent Results (from the past 240 hour(s))  Culture, blood (x 2)     Status: None (Preliminary result)   Collection Time: 06/24/20 10:00 PM   Specimen: BLOOD  Result Value Ref Range Status   Specimen Description BLOOD BLOOD LEFT HAND  Final   Special Requests   Final    BOTTLES DRAWN AEROBIC ONLY Blood Culture results may not be optimal due to an inadequate volume of blood received in culture bottles   Culture   Final    NO GROWTH 2 DAYS Performed at [facility], [address], [place], [place] [address]    Report Status PENDING  Incomplete  Culture, blood (x 2)     Status: None (Preliminary result)   Collection Time: 06/24/20 10:01  PM   Specimen: BLOOD  Result Value Ref Range Status   Specimen Description BLOOD BLOOD RIGHT HAND  Final   Special Requests   Final    BOTTLES DRAWN AEROBIC ONLY Blood Culture results may not be optimal due to an inadequate volume of blood received in culture bottles   Culture   Final    NO GROWTH 2 DAYS Performed at [facility], [address], [place], [place] [address]    Report Status PENDING  Incomplete  MRSA PCR Screening     Status: None   Collection Time: 06/25/20  6:27 PM   Specimen: Nasopharyngeal  Result Value Ref Range Status   MRSA by PCR NEGATIVE NEGATIVE Final    Comment:        The GeneXpert MRSA Assay (FDA approved for NASAL specimens only), is one component of a comprehensive MRSA colonization surveillance program. It is not intended to diagnose MRSA infection nor to guide or monitor treatment for MRSA infections. Performed at [facility], [address] [place], [place] [address]      Studies: [facility] Chest [facility] 1 View  Result Date: 06/25/2020 CLINICAL DATA:  GI bleed.  COVID. EXAM: PORTABLE CHEST 1 VIEW COMPARISON:  Chest x-ray 06/18/2020.  CT abdomen 06/18/2020. FINDINGS: Interim removal of right IJ line. Interim placement of left IJ dual-lumen catheter. Tip over cavoatrial junction. Cardiomegaly. Diffuse bilateral interstitial prominence consistent interstitial edema and or pneumonitis. Dense left base atelectasis/consolidation. Small left pleural effusion. No pneumothorax. Aortic stent graft noted over the upper abdomen. Catheter noted over the right upper quadrant. Pneumoperitoneum again noted. IMPRESSION: 1. Interim removal of right IJ line. Interim placement of left IJ dual-lumen catheter. Tip over cavoatrial junction. 2. Cardiomegaly. Diffuse bilateral interstitial prominence consistent with interstitial edema and or pneumonitis. Dense left base atelectasis/consolidation. 3. Pneumoperitoneum again noted. Electronically Signed   By: Marcello Moores   Register   On: 06/25/2020 08:50    Scheduled Meds: . sodium chloride   Intravenous Once  . atorvastatin  40 mg Oral Daily  . benzonatate  100 mg Oral Q8H  . carvedilol  3.125 mg Oral BID WC  .  Chlorhexidine Gluconate Cloth  6 each Topical Daily  . epoetin (EPOGEN/PROCRIT) injection  20,000 Units Subcutaneous Weekly  . gentamicin cream  1 application Topical Daily  . guaiFENesin  600 mg Oral BID  . methylPREDNISolone (SOLU-MEDROL) injection  40 mg Intravenous Daily  . multivitamin  1 tablet Oral QHS  . pantoprazole  40 mg Intravenous Q12H  . polyethylene glycol  17 g Oral Daily   Continuous Infusions: . sodium chloride 50 mL/hr at 06/26/20 0030  . acetaminophen 1,000 mg (06/26/20 0027)  . dialysis solution 1.5% low-MG/low-CA    . piperacillin-tazobactam (ZOSYN)  IV 2.25 g (06/26/20 1039)    Assessment/Plan:  1. Left lobar pneumonia, sepsis starting on 06/24/2020 with fever, tachycardia and tachypnea and acute toxic metabolic encephalopathy.  Started on Zosyn.  So far blood cultures negative.  Discontinue vancomycin since MRSA PCR negative. 2. Acute blood loss anemia.  Today's hemoglobin 7.7.  Continue to monitor closely. 3. Covid positive infection with pneumonia.  Started on Solu-Medrol low-dose.  Hold off on remdesivir. 4. Acute toxic metabolic encephalopathy secondary to uremia, sepsis.  Concerned about the patient's mental status today.  Continue to monitor.  Continue hemodialysis and antibiotics. 5. Chronic systolic congestive heart failure.  Dialysis to manage fluid.  Continue Coreg. 6. Essential hypertension on Coreg 7. End-stage renal disease.  Dialysis as per nephrology. 8. Weakness.  Continue working with physical therapy     Code Status:     Code Status Orders  (From admission, onward)         Start     Ordered   06/18/20 2122  Full code  Continuous        06/18/20 2123        Code Status History    Date Active Date Inactive Code Status Order ID Comments  User Context   05/06/2020 0355 05/06/2020 2301 Full Code [id number]  Athena Masse, MD ED   04/10/2020 1913 04/11/2020 2230 Full Code [id number]  Cox, Amy N, DO ED   11/19/2019 0051 11/23/2019 2133 Full Code [id number]  Nelle Don, MD Inpatient   09/20/2019 1416 09/30/2019 2350 Full Code [id number]  Loletha Grayer, MD ED   08/14/2019 1132 08/15/2019 2113 Full Code [id number]  Schnier, Dolores Lory, MD Inpatient   Advance Care Planning Activity     Family Communication: Spoke with daughter at the bedside this morning and left message for her on her phone this afternoon Disposition Plan: Status is: Inpatient   Dispo: The patient is from: Home              Anticipated d/c is to: Home              Anticipated d/c date is: Likely will need 4 days here in the hospital              Patient currently mental status not well enough to do well enough with speech therapy today.   Difficult to place patient.  No, if well enough to go home.  Time spent: 27 minutes  [place]

## 2020-06-26 NOTE — Evaluation (Signed)
Clinical/Bedside Swallow Evaluation Patient Details  Name: Scott Oconnor MRN: [id number] Date of Birth: May 06, 1946  Today's Date: 06/26/2020 Time: SLP Start Time (ACUTE ONLY): 0910 SLP Stop Time (ACUTE ONLY): 0955 SLP Time Calculation (min) (ACUTE ONLY): 45 min  Past Medical History:  Past Medical History:  Diagnosis Date  . Hyperlipidemia   . Hypertension   . PVD (peripheral vascular disease) ([place])   . Renal disorder    ESRD  . Reynolds syndrome [facility])    Past Surgical History:  Past Surgical History:  Procedure Laterality Date  . DIALYSIS/PERMA CATHETER INSERTION N/A 09/26/2019   Procedure: DIALYSIS/PERMA CATHETER INSERTION;  Surgeon: Algernon Huxley, MD;  Location: [place] CV LAB;  Service: Cardiovascular;  Laterality: N/A;  . DIALYSIS/PERMA CATHETER INSERTION N/A 06/23/2020   Procedure: DIALYSIS/PERMA CATHETER INSERTION;  Surgeon: Algernon Huxley, MD;  Location: [place] [facility];  Service: Cardiovascular;  Laterality: N/A;  . DIALYSIS/PERMA CATHETER REMOVAL N/A 06/19/2020   Procedure: DIALYSIS/PERMA CATHETER REMOVAL;  Surgeon: Algernon Huxley, MD;  Location: [place] CV LAB;  Service: Cardiovascular;  Laterality: N/A;  . ENDOVASCULAR REPAIR/STENT GRAFT N/A 08/14/2019   Procedure: ENDOVASCULAR REPAIR/STENT GRAFT;  Surgeon: Katha Cabal, MD;  Location: [place] [facility];  Service: Cardiovascular;  Laterality: N/A;   HPI:  [name] is a 75yo male comes to ED on 1/27 after several days SOB, family concerned about pt having COVID. DTR reports cough, N/V, back pain. Pt recently transitioned from HD to PD without any issues (PTA), but has been receiving HD while admitted with plans to resume PD at DC. ESRD on PD, HTN, PVD, HLD, PAD on eliquis, AAA s/ repair, CHF, Labs reveal Hb: 7.3 (10.5 ten days prior), guaiac positive stool. Pt has been febrile with changes noted in his mentation on 06/25/2020. Head CT on 06/23/2020 was negative for any acute abnormalities but did  describe hypodensity in the thalamus bilaterally and in the periventricular white matter and left caudate most consistent with chronic ischemia, Small chronic infarct left cerebellum, patchy hypodensity right cerebellum consistent with chronic infarct. Chest x-ray on 06/25/2020 revealed diffuse bilateral interstitial prominence consistent with interstitial edema and or pneumonitis, Dense left base atelectasis/consolidation and pneumoperitoneum again noted.   Assessment / Plan / Recommendation Clinical Impression  Pt presents with severe oral phase deficits that are appear to be the result of his current mentation deficits. Despite maximal cues from myself and his daughter, he largely refused to except PO trials. He pursed his lips together or moved his head away from the cup/spoon. He did consume very small cup sips of thin liquids, small (<half spoon) of puree and 1 piece of a diced peach. Pt's pharyngeal swallow appeared swift in response to thin liquids and was free of overt s/s of aspiration. With the puree texture, pt's oral phase was lengthy but he did orally clear all of the bolus. He didn't any exhibit any oral response to the diced peach. He pulled his hand away when we attempted to place cup in his hands, he would not open his mouth for any oral checks and he also swatted at his daughter as she was attempting to remove his dentures. During this evaluation, pt's daughter offer several reasons that the above occurred. As each reason was voiced, I accommodated but pt's response remained the same. At this time, I am recommending dysphagia 1 diet with thin liquids via cup sips, medicine crushed in puree. I am not only concerned about pt's increased aspiration risk  but am more concerned that he may not be able to maintain his nutrition and hydration. Extensive education provided to the pt's daughter. While she voiced understanding, further education will be needed. SLP Visit Diagnosis: Dysphagia, oral phase  (R13.11)    Aspiration Risk  Severe aspiration risk;Risk for inadequate nutrition/hydration    Diet Recommendation Dysphagia 1 (Puree);Thin liquid   Liquid Administration via: Cup Medication Administration: Crushed with puree Supervision: Full supervision/cueing for compensatory strategies;Staff to assist with self feeding Compensations: Minimize environmental distractions;Slow rate;Small sips/bites Postural Changes: Seated upright at 90 degrees    Other  Recommendations Oral Care Recommendations: Oral care BID Other Recommendations: Have oral suction available   Follow up Recommendations  (TBD)      Frequency and Duration min 2x/week  2 weeks       Prognosis Prognosis for Safe Diet Advancement: Guarded Barriers to Reach Goals: Cognitive deficits;Severity of deficits;Behavior;Motivation      Swallow Study   General Date of Onset: 06/25/20 HPI: Scott Oconnor is a 75yo male comes to ED on 1/27 after several days SOB, family concerned about pt having COVID. DTR reports cough, N/V, back pain. Pt recently transitioned from HD to PD without any issues (PTA), but has been receiving HD while admitted with plans to resume PD at DC. ESRD on PD, HTN, PVD, HLD, PAD on eliquis, AAA s/ repair, CHF, Labs reveal Hb: 7.3 (10.5 ten days prior), guaiac positive stool. Pt has been febrile with changes noted in his mentation on 06/25/2020. Head CT on 06/23/2020 was negative for any acute abnormalities but did describe hypodensity in the thalamus bilaterally and in the periventricular white matter and left caudate most consistent with chronic ischemia, Small chronic infarct left cerebellum, patchy hypodensity right cerebellum consistent with chronic infarct. Chest x-ray on 06/25/2020 revealed diffuse bilateral interstitial prominence consistent with interstitial edema and or pneumonitis, Dense left base atelectasis/consolidation and pneumoperitoneum again noted. Type of Study: Bedside Swallow  Evaluation Previous Swallow Assessment: none in chart Diet Prior to this Study: Regular;Thin liquids Temperature Spikes Noted: Yes Respiratory Status: Room air History of Recent Intubation: No Behavior/Cognition: Uncooperative;Doesn't follow directions Oral Cavity Assessment: Within Functional Limits Oral Cavity - Dentition: Dentures, top;Dentures, bottom Self-Feeding Abilities: Total assist (d/t mentation deficits) Patient Positioning: Upright in bed Baseline Vocal Quality: Not observed Volitional Cough: Cognitively unable to elicit Volitional Swallow: Unable to elicit    Oral/Motor/Sensory Function Overall Oral Motor/Sensory Function: Within functional limits   Ice Chips Ice chips: Within functional limits Presentation: Spoon   Thin Liquid Thin Liquid: Within functional limits Presentation: Cup;Spoon    Nectar Thick Nectar Thick Liquid: Not tested   Honey Thick Honey Thick Liquid: Not tested   Puree Puree: Impaired Presentation: Spoon Oral Phase Impairments: Reduced lingual movement/coordination;Poor awareness of bolus Oral Phase Functional Implications: Oral holding;Prolonged oral transit   Solid     Solid: Impaired (diced peach) Presentation: Spoon Oral Phase Impairments: Poor awareness of bolus;Reduced lingual movement/coordination;Impaired mastication Oral Phase Functional Implications: Oral holding     Kaimani Clayson B. Rutherford Nail M.S., CCC-SLP, [facility] Rehabilitation Services Office [phone]  Rael Tilly Rutherford Nail 06/26/2020,11:56 AM

## 2020-06-26 NOTE — Progress Notes (Signed)
Patient ID: Scott Oconnor, male   DOB: [birth year], 75 y.o.   MRN: [id number]  Spoke with patient's daughter and she would like to get him home at some point because she thinks he will do better at home.  I advised that he is gone to eat and drink better in order to go home.  Will continue monitoring through the weekend and then reassess on timing of going home.  Hopefully with starting of antibiotics yesterday things will improve.  We will get palliative care consultation  Dr. Leslye Peer

## 2020-06-26 NOTE — Progress Notes (Signed)
Physical Therapy Treatment Patient Details Name: Scott Oconnor MRN: [id number] DOB: 1945/09/14 Today's Date: 06/26/2020    History of Present Illness Scott Oconnor is a 75yo male comes to ED on 1/27 after several days SOB, family concerned about pt having COVID. DTR reports cough, N/V, back pain. Pt recently transitioned from HD to PD without any issues (PTA), but has been receiving HD while admitted with plans to resume PD at DC. ESRD on PD, HTN, PVD, HLD, PAD on eliquis, AAA s/ repair, CHF, Labs reveal Hb: 7.3 (10.5 ten days prior), guaiac positive stool. Pt has been febrile days prior to PT evaluation.    PT Comments    Pt awake in bed, responds to visual greeting gesture. DTR Tanya attending throughout. Pt withdrawn, presents similar to typical dementia with moderate language deficits, minimally interactive, difficulty attending to author for more than a few seconds at a time. Pt not follow ing simple cues from author or DTR. This is not his baseline. Pt total to EOB, some intermittent grimacing, but able to sit unsupported for several minutes with good posture. Attempted STS transfer but pt not cognitively able to participate. DTR attributes to difficulty in session to [facility], then pt's personality tendencies ("stubbornness"), then cultural difference, however, presentation is quite familiar to Chief Strategy Officer. Commenced education with DTR regarding cognitive impairment, AMS, communication and interaction strategies. Discussed pt may have greater needs of care at home at DC. Pt/DTR roll patient in bed for peri care due to fecal streaking, clean linens provided. Will continue to follow. Unable to trial AMB at this time due to AMS.    Follow Up Recommendations  Supervision for mobility/OOB;Home health PT     Equipment Recommendations  Wheelchair (measurements PT);Wheelchair cushion (measurements PT) (unclear if family already has a WC at home)    Recommendations for Other Services        Precautions / Restrictions Precautions Precautions: Fall Restrictions Weight Bearing Restrictions: No    Mobility  Bed Mobility Overal bed mobility: Needs Assistance Bed Mobility: Supine to Sit;Sit to Supine;Rolling Rolling: Mod assist   Supine to sit: Mod assist Sit to supine: Mod assist   General bed mobility comments: DTR Tanya assisting  Transfers Overall transfer level:  (deferred, pt not following commands for attempts, generally too altered.)                  Ambulation/Gait                 Stairs             Wheelchair Mobility    Modified Rankin (Stroke Patients Only)       Balance Overall balance assessment: Mild deficits observed, not formally tested;Modified Independent                                          Cognition Arousal/Alertness: Awake/alert Behavior During Therapy: WFL for tasks assessed/performed Overall Cognitive Status: Impaired/Different from baseline                                 General Comments: difficult attending to author, not following simple 1-step commands, somewhat drowsy      Exercises      General Comments        Pertinent Vitals/Pain Pain Assessment: Faces Faces Pain Scale: Hurts even more Pain  Location: grimaces and moans when author moves legs OOB Pain Intervention(s): Limited activity within patient's tolerance    Home Living                      Prior Function            PT Goals (current goals can now be found in the care plan section) Acute Rehab PT Goals Patient Stated Goal: regain baseline mentation, independence in ADL PT Goal Formulation: With family Time For Goal Achievement: 07/09/20 Potential to Achieve Goals: Fair Progress towards PT goals: Progressing toward goals    Frequency    Min 2X/week      PT Plan Current plan remains appropriate    Co-evaluation              AM-PAC PT "6 Clicks" Mobility   Outcome  Measure  Help needed turning from your back to your side while in a flat bed without using bedrails?: A Little Help needed moving from lying on your back to sitting on the side of a flat bed without using bedrails?: A Lot Help needed moving to and from a bed to a chair (including a wheelchair)?: Total Help needed standing up from a chair using your arms (e.g., wheelchair or bedside chair)?: Total Help needed to walk in hospital room?: Total Help needed climbing 3-5 steps with a railing? : Total 6 Click Score: 9    End of Session Equipment Utilized During Treatment: Gait belt Activity Tolerance: No increased pain;Other (comment) Patient left: in chair;with family/visitor present;with call bell/phone within reach Nurse Communication: Mobility status PT Visit Diagnosis: Difficulty in walking, not elsewhere classified (R26.2);Muscle weakness (generalized) (M62.81);Other symptoms and signs involving the nervous system (R29.898)     Time: [id number] PT Time Calculation (min) (ACUTE ONLY): 32 min  Charges:  $Therapeutic Activity: 23-37 mins                     1:34 PM, 06/26/20 Etta Grandchild, PT, DPT Physical Therapist - [facility]  [phone] ([place])     [place] C 06/26/2020, 1:30 PM

## 2020-06-26 NOTE — Progress Notes (Signed)
Pt is noted to be pocketing particles in his mouth. Pt is instructed to expell mucus however he does not respond. Pt would not allow staff to provide suction.Daughter is at bedside and assisted staff by holding his arms while suction was performed. SLP consult has been initiated r/t to pocketing.

## 2020-06-26 NOTE — Care Management Important Message (Signed)
Important Message  Patient Details  Name: Scott Oconnor MRN: [id number] Date of Birth: 17-Mar-1946   Medicare Important Message Given:  Other (see comment)  Patient is in isolation and I called the room to review the Important Message from Medicare but there was no answer.  Will try again.  [name] 06/26/2020, 12:52 PM

## 2020-06-26 NOTE — Consult Note (Signed)
Consultation Note Date: 06/26/2020   Patient Name: Scott Oconnor  DOB: July 22, 1945  MRN: [id number]  Age / Sex: 75 y.o., male  PCP: [name], MD Referring Physician: Loletha Grayer, MD  Reason for Consultation: Establishing goals of care  HPI/Patient Profile: 75 y.o. male  with past medical history of ESRD on dialysis- was doing PD at home, now doing HD, anemia of CKD, HTN, CHF, PVD admitted on 06/18/2020 with acute GI bleeding, weakness, and COVID 19 pneumonia. Admission has been complicated by uremia likely causing impaired cognitive function.  Pneumonia has also progressed- started on antibiotics. Remdesivir was recommended for COVID treatment- however family declined. He has poor po intake. Mental status is waxing and waning. Palliative medicine consulted for goals of care.   Clinical Assessment and Goals of Care: Reviewed chart.  Called patient's daughter for goals of care discussion.  Prior to admission AJ lived at home- was ambulatory and cared for himself independently.  Reviewed patient's comorbidities and possible illness trajectory.  Daughter mostly concerned at this point about patient not eating. We discussed this does occur sometimes in patients recovering from [place].  [name] feels patient was doing better today, alert and oriented, eating some, but also refusing food as well. This is a source of frustration for her- she understands that if patient's intake of food and fluids do not improve- if he is discharged from the hospital he would likely need to return quickly. Daughter would like to see how patient fares over the weekend and then discuss feeding tube options.  Advanced care planning and code status was discussed. Patient does not have advanced directives. Family requests full code status at this time per patient's expressed wishes. Goals of care for now are full scope- with the  goal of restoring patient to point where he can go home.    Primary Decision Maker NEXT OF KIN- patient's daughter    SUMMARY OF RECOMMENDATIONS -Full code/full scope -Allow time for outcomes -Family agreed to follow-up on Monday - they are hopeful that initiation of antibiotics will aid in approving patient's mental status and oral intake   Code Status/Advance Care Planning:  Full code  Palliative Prophylaxis:   Delirium Protocol  Prognosis:    Unable to determine  Discharge Planning: To Be Determined  Primary Diagnoses: Present on Admission: . (Resolved) Acute GI bleeding . HFrEF (heart failure with reduced ejection fraction) ([place]) . Essential hypertension   I have reviewed the medical record, interviewed the patient and family, and examined the patient. The following aspects are pertinent.  Past Medical History:  Diagnosis Date  . Hyperlipidemia   . Hypertension   . PVD (peripheral vascular disease) ([place])   . Renal disorder    ESRD  . Reynolds syndrome [facility])    Social History   Socioeconomic History  . Marital status: Married    Spouse name: Not on file  . Number of children: Not on file  . Years of education: Not on file  . Highest education level: Not on file  Occupational  History  . Not on file  Tobacco Use  . Smoking status: Former Smoker    Packs/day: 0.33    Types: Cigarettes  . Smokeless tobacco: Never Used  . Tobacco comment: 6 cigarettes daily   Vaping Use  . Vaping Use: Never used  Substance and Sexual Activity  . Alcohol use: No  . Drug use: No  . Sexual activity: Yes  Other Topics Concern  . Not on file  Social History Narrative  . Not on file   Social Determinants of Health   Financial Resource Strain: Not on file  Food Insecurity: Not on file  Transportation Needs: Not on file  Physical Activity: Not on file  Stress: Not on file  Social Connections: Not on file   Scheduled Meds: . sodium chloride   Intravenous Once  .  atorvastatin  40 mg Oral Daily  . benzonatate  100 mg Oral Q8H  . carvedilol  3.125 mg Oral BID WC  . Chlorhexidine Gluconate Cloth  6 each Topical Daily  . epoetin (EPOGEN/PROCRIT) injection  20,000 Units Subcutaneous Weekly  . gentamicin cream  1 application Topical Daily  . guaiFENesin  600 mg Oral BID  . methylPREDNISolone (SOLU-MEDROL) injection  40 mg Intravenous Daily  . multivitamin  1 tablet Oral QHS  . pantoprazole  40 mg Intravenous Q12H  . polyethylene glycol  17 g Oral Daily   Continuous Infusions: . sodium chloride 50 mL/hr at 06/26/20 0030  . dialysis solution 1.5% low-MG/low-CA    . piperacillin-tazobactam (ZOSYN)  IV 2.25 g (06/26/20 1039)   PRN Meds:.acetaminophen, acetaminophen, calcium carbonate, guaiFENesin-dextromethorphan, lidocaine, loperamide, ondansetron (ZOFRAN) IV, ondansetron (ZOFRAN) IV Medications Prior to Admission:  Prior to Admission medications   Medication Sig Start Date End Date Taking? Authorizing Provider  acetaminophen (TYLENOL) 500 MG tablet Take 500-1,000 mg by mouth every 8 (eight) hours as needed for mild pain or moderate pain.    Yes [provider]  atorvastatin (LIPITOR) 40 MG tablet Take 1 tablet (40 mg total) by mouth daily. 11/24/19  Yes Pokhrel, Laxman, MD  azithromycin (ZITHROMAX) 250 MG tablet Take by mouth See admin instructions. TAKE 2 TABLETS BY MOUTH ON DAY 1 THEN 1 TABLET BY MOUTH ON DAY 2-5 06/12/20  Yes [provider]  calcium carbonate (TUMS - DOSED IN MG ELEMENTAL CALCIUM) 500 MG chewable tablet Chew 1 tablet by mouth 3 (three) times daily as needed for heartburn.   Yes [provider]  carvedilol (COREG) 6.25 MG tablet Take 1 tablet (6.25 mg total) by mouth 2 (two) times daily with a meal. 11/23/19  Yes Pokhrel, Laxman, MD  gabapentin (NEURONTIN) 300 MG capsule Take 300 mg by mouth at bedtime.  11/09/19  Yes [provider]  hydrALAZINE (APRESOLINE) 50 MG tablet Take 2 tablets (100 mg total) by  mouth 3 (three) times daily. 06/02/20  Yes Gollan, Kathlene November, MD  irbesartan (AVAPRO) 150 MG tablet Take 150 mg by mouth every evening.  10/22/19  Yes [provider]  multivitamin (RENA-VIT) TABS tablet Take 1 tablet by mouth at bedtime. 09/30/19  Yes Fritzi Mandes, MD  ondansetron (ZOFRAN) 4 MG tablet Take 1 tablet (4 mg total) by mouth daily as needed for nausea or vomiting. 06/22/20 06/22/21 Yes Cherene Altes, MD  pantoprazole (PROTONIX) 40 MG tablet Take 1 tablet 2 times a day for 30 days, then change to 1 tablet a day 06/22/20  Yes Cherene Altes, MD  predniSONE (DELTASONE) 10 MG tablet Take 10 mg  by mouth 2 (two) times daily. 06/12/20  Yes [provider]  torsemide (DEMADEX) 20 MG tablet Take 2 tablets (40 mg total) by mouth daily. Patient taking differently: Take 40 mg by mouth 2 (two) times daily. 11/23/19  Yes Pokhrel, Laxman, MD  apixaban (ELIQUIS) 2.5 MG TABS tablet Take 1 tablet (2.5 mg total) by mouth 2 (two) times daily. 06/29/20   Cherene Altes, MD   Allergies  Allergen Reactions  . Ivp Dye [Iodinated Diagnostic Agents]     ESRD patient    Review of Systems  Physical Exam  Vital Signs: BP (!) 137/55 (BP Location: Right Arm)   Pulse 66   Temp 97.9 F (36.6 C) (Oral)   Resp 16   Ht '5\' 10"'$  (1.778 m)   Wt 54.9 kg   SpO2 100%   BMI 17.37 kg/m  Pain Scale: 0-10 POSS *See Group Information*: S-Acceptable,Sleep, easy to arouse Pain Score: 0-No pain   SpO2: SpO2: 100 % O2 Device:SpO2: 100 % O2 Flow Rate: .O2 Flow Rate (L/min): 3 L/min  IO: Intake/output summary: No intake or output data in the 24 hours ending 06/26/20 1356  LBM: Last BM Date: 06/21/20 Baseline Weight: Weight: 52 kg Most recent weight: Weight: 54.9 kg     Palliative Assessment/Data: PPS: 30%    The above conversation was completed via telephone due to the visitor restrictions during the COVID-19 pandemic. Thorough chart review and discussion with necessary members of the care  team was completed as part of assessment. All issues were discussed and addressed but no physical exam was performed.   Thank you for this consult. Palliative medicine will continue to follow and assist as needed.   Time In: 1314 Time Out: 1426 Time Total: 72 minutes Greater than 50%  of this time was spent counseling and coordinating care related to the above assessment and plan.  Signed by: Mariana Kaufman, AGNP-C Palliative Medicine    Please contact Palliative Medicine Team phone at [phone] for questions and concerns.  For individual provider: See Shea Evans

## 2020-06-27 DIAGNOSIS — A419 Sepsis, unspecified organism: Secondary | ICD-10-CM | POA: Diagnosis not present

## 2020-06-27 DIAGNOSIS — J181 Lobar pneumonia, unspecified organism: Secondary | ICD-10-CM | POA: Diagnosis not present

## 2020-06-27 DIAGNOSIS — U071 COVID-19: Secondary | ICD-10-CM | POA: Diagnosis not present

## 2020-06-27 DIAGNOSIS — D62 Acute posthemorrhagic anemia: Secondary | ICD-10-CM | POA: Diagnosis not present

## 2020-06-27 LAB — CBC
HCT: 21.6 % — ABNORMAL LOW (ref 39.0–52.0)
Hemoglobin: 7.2 g/dL — ABNORMAL LOW (ref 13.0–17.0)
MCH: 32.4 pg (ref 26.0–34.0)
MCHC: 33.3 g/dL (ref 30.0–36.0)
MCV: 97.3 fL (ref 80.0–100.0)
Platelets: 133 10*3/uL — ABNORMAL LOW (ref 150–400)
RBC: 2.22 MIL/uL — ABNORMAL LOW (ref 4.22–5.81)
RDW: 15.4 % (ref 11.5–15.5)
WBC: 7.8 10*3/uL (ref 4.0–10.5)
nRBC: 0.5 % — ABNORMAL HIGH (ref 0.0–0.2)

## 2020-06-27 LAB — PHOSPHORUS: Phosphorus: 6.4 mg/dL — ABNORMAL HIGH (ref 2.5–4.6)

## 2020-06-27 LAB — PREPARE RBC (CROSSMATCH)

## 2020-06-27 LAB — C-REACTIVE PROTEIN: CRP: 16.4 mg/dL — ABNORMAL HIGH (ref <1.0)

## 2020-06-27 MED ORDER — LIDOCAINE VISCOUS HCL 2 % MT SOLN: 15.0000 mL | OROMUCOSAL | 0 refills | Status: AC | PRN

## 2020-06-27 MED ORDER — ACETAMINOPHEN 325 MG PO TABS: 650.0000 mg | ORAL_TABLET | Freq: Three times a day (TID) | ORAL | Status: DC | PRN

## 2020-06-27 MED ORDER — PANTOPRAZOLE SODIUM 40 MG PO TBEC: DELAYED_RELEASE_TABLET | ORAL | 0 refills | Status: AC

## 2020-06-27 MED ORDER — AMOXICILLIN-POT CLAVULANATE 875-125 MG PO TABS: 1.0000 | ORAL_TABLET | Freq: Two times a day (BID) | ORAL | 0 refills | Status: AC

## 2020-06-27 MED ORDER — SODIUM CHLORIDE 0.9% IV SOLUTION: Freq: Once | INTRAVENOUS | Status: DC

## 2020-06-27 MED ORDER — BENZONATATE 100 MG PO CAPS: 100.0000 mg | ORAL_CAPSULE | Freq: Three times a day (TID) | ORAL | 0 refills | Status: AC

## 2020-06-27 MED ORDER — CARVEDILOL 3.125 MG PO TABS: 3.1250 mg | ORAL_TABLET | Freq: Two times a day (BID) | ORAL | 0 refills | Status: AC

## 2020-06-27 MED ORDER — ONDANSETRON HCL 4 MG PO TABS: 4.0000 mg | ORAL_TABLET | Freq: Every day | ORAL | 0 refills | Status: AC | PRN

## 2020-06-27 MED ORDER — PREDNISONE 10 MG PO TABS: ORAL_TABLET | ORAL | 0 refills | Status: AC

## 2020-06-27 NOTE — Progress Notes (Signed)
[facility], [place] 06/27/20  Subjective:   LOS: 9  Patient resting in bed, daughter at bedside.  He is awake and alert, appears slightly lethargic.   Objective:  Vital signs in last 24 hours:  Temp:  [97.6 F (36.4 C)-99.1 F (37.3 C)] 97.8 F (36.6 C) (02/05 1245) Pulse Rate:  [54-79] 79 (02/05 1245) Resp:  [12-26] 22 (02/05 1245) BP: (101-168)/(66-87) 157/76 (02/05 1245) SpO2:  [91 %-100 %] 91 % (02/05 1245)  Weight change:  Filed Weights   06/20/20 0519 06/22/20 0458 06/26/20 0500  Weight: 55.1 kg 55.2 kg 54.9 kg    Intake/Output:    Intake/Output Summary (Last 24 hours) at 06/27/2020 1356 Last data filed at 06/27/2020 1300 Gross per 24 hour  Intake 348 ml  Output 0 ml  Net 348 ml     Physical Exam: General:  Resting in bed, lethargic, chronically ill-appearing  HEENT  normocephalic, atraumatic  Pulm/lungs  lungs clear bilaterally, respirations even and unlabored  CVS/Heart  S1-S2, no rubs or gallops  Abdomen:  soft, nondistended, nontender  Extremities:  Trace peripheral edema  Neurologic:  Awake, alert  Skin: No lesions or rashes  Access: PD cath ,Left IJ Permcath       Basic Metabolic Panel:  Recent Labs  Lab 06/21/20 0416 06/22/20 0510 06/23/20 0559 06/24/20 [id number] 06/24/20 2159 06/25/20 0759 06/26/20 0536 06/27/20 1051  NA 139   < > 143 140 136 137 134*  --   K 4.8   < > 4.5 4.3 3.8 4.3 4.9  --   CL 98   < > 97* 98 95* 95* 93*  --   CO2 19*   < > 22 20* 21* 22 23  --   GLUCOSE 116*   < > 132* 118* 87 83 168*  --   BUN 128*   < > 116* 109* 51* 60* 48*  --   CREATININE 15.49*   < > 15.88* 16.30* 8.46* 9.52* 6.39*  --   CALCIUM 7.6*   < > 7.7* 7.1* 7.6* 7.6* 8.1*  --   PHOS 9.5*  --  9.1* 10.0*  --   --   --  6.4*   < > = values in this interval not displayed.     CBC: Recent Labs  Lab 06/24/20 0619 06/24/20 2159 06/25/20 0759 06/26/20 0536 06/27/20 0618  WBC 9.2 9.9 9.8 5.5 7.8  NEUTROABS  --  8.7*  --    --   --   HGB 6.9* 8.0* 8.1* 7.7* 7.2*  HCT 20.6* 23.8* 24.0* 23.1* 21.6*  MCV 97.6 99.2 98.0 97.9 97.3  PLT 98* 100* 112* 125* 133*      Lab Results  Component Value Date   HEPBSAG NON REACTIVE 05/06/2020   HEPBSAB NON REACTIVE 09/20/2019   HEPBIGM NON REACTIVE 09/20/2019      Microbiology:  Recent Results (from the past 240 hour(s))  Culture, blood (x 2)     Status: None (Preliminary result)   Collection Time: 06/24/20 10:00 PM   Specimen: BLOOD  Result Value Ref Range Status   Specimen Description BLOOD BLOOD LEFT HAND  Final   Special Requests   Final    BOTTLES DRAWN AEROBIC ONLY Blood Culture results may not be optimal due to an inadequate volume of blood received in culture bottles   Culture   Final    NO GROWTH 3 DAYS Performed at [facility], [address] [place], [place] [address]  Report Status PENDING  Incomplete  Culture, blood (x 2)     Status: None (Preliminary result)   Collection Time: 06/24/20 10:01 PM   Specimen: BLOOD  Result Value Ref Range Status   Specimen Description BLOOD BLOOD RIGHT HAND  Final   Special Requests   Final    BOTTLES DRAWN AEROBIC ONLY Blood Culture results may not be optimal due to an inadequate volume of blood received in culture bottles   Culture   Final    NO GROWTH 3 DAYS Performed at [facility], [address], [place], [place] [address]    Report Status PENDING  Incomplete  MRSA PCR Screening     Status: None   Collection Time: 06/25/20  6:27 PM   Specimen: Nasopharyngeal  Result Value Ref Range Status   MRSA by PCR NEGATIVE NEGATIVE Final    Comment:        The GeneXpert MRSA Assay (FDA approved for NASAL specimens only), is one component of a comprehensive MRSA colonization surveillance program. It is not intended to diagnose MRSA infection nor to guide or monitor treatment for MRSA infections. Performed at [facility], [address] [place], [place] [address]      Coagulation Studies: Recent Labs    06/24/20 08-24-57  LABPROT 14.2  INR 1.1    Urinalysis: No results for input(s): COLORURINE, LABSPEC, PHURINE, GLUCOSEU, HGBUR, BILIRUBINUR, KETONESUR, PROTEINUR, UROBILINOGEN, NITRITE, LEUKOCYTESUR in the last 72 hours.  Invalid input(s): APPERANCEUR    Imaging: No results found.   Medications:   . sodium chloride 50 mL/hr at 06/26/20 0030  . acetaminophen 1,000 mg (06/27/20 0600)  . dialysis solution 1.5% low-MG/low-CA    . piperacillin-tazobactam (ZOSYN)  IV 2.25 g (06/27/20 0858)   . sodium chloride   Intravenous Once  . sodium chloride   Intravenous Once  . atorvastatin  40 mg Oral Daily  . benzonatate  100 mg Oral Q8H  . carvedilol  3.125 mg Oral BID WC  . Chlorhexidine Gluconate Cloth  6 each Topical Daily  . epoetin (EPOGEN/PROCRIT) injection  20,000 Units Subcutaneous Weekly  . gentamicin cream  1 application Topical Daily  . guaiFENesin  600 mg Oral BID  . methylPREDNISolone (SOLU-MEDROL) injection  40 mg Intravenous Daily  . multivitamin  1 tablet Oral QHS  . pantoprazole  40 mg Intravenous Q12H  . polyethylene glycol  17 g Oral Daily   acetaminophen **OR** acetaminophen **OR** acetaminophen, calcium carbonate, guaiFENesin-dextromethorphan, lidocaine, loperamide, ondansetron (ZOFRAN) IV, ondansetron (ZOFRAN) IV  Assessment/ Plan:  75 y.o. male with  end stage renal disease on peritoneal dialysis, hypertension, AAA repair, congestive heart failure, peripheral vascular disease, raynaud's syndrome, hyperlipidemia  was admitted on 06/18/2020 for  Active Problems:   Essential hypertension   HFrEF (heart failure with reduced ejection fraction) (H[facility])   End stage renal disease on dialysis (HCC)   Acute blood loss anemia   [id number]   Toxic metabolic encephalopathy   Lobar pneumonia (HCC)   Sepsis with encephalopathy without septic shock (HCC)  Acute GI bleeding [K92.2] Generalized weakness [R53.1] Gastrointestinal  hemorrhage, unspecified gastrointestinal hemorrhage type [K92.2] COVID-19 [U07.1]   [facility] 52.5kg CCPD 9 hours 5 exchanges 2075m fills  #. ESRD Planning for hemodialysis today Patient's daughter requesting discharge, with plan to continue PD at home   #. Anemia of CKD  Lab Results  Component Value Date   HGB 7.2 (L) 06/27/2020  PRBC transfusion today Epogen weekly  #. Secondary hyperparathyroidism of renal origin  N 25.81   No results found for: PTH Lab Results  Component Value Date   PHOS 6.4 (H) 06/27/2020   We will continue bone mineral metabolism parameters  #Hypertension:  Home regimen of irbesartan, hydralazine, and carvedilol Blood pressure readings within acceptable range  #Covid -19 positive pneumonia Tested positive on June 18, 2020 No acute respiratory distress noted, patient is on room air   LOS: [place] 2/5/20221:56 PM

## 2020-06-27 NOTE — TOC Transition Note (Signed)
Transition of Care [facility]) - CM/SW Discharge Note   Patient Details  Name: Scott Oconnor MRN: [id number] Date of Birth: Dec 10, 1945  [facility] of [facility] CM/SW Contact:  Izola Price, RN Phone Number: 06/27/2020, 3:37 PM   Clinical Narrative:   Pt to be discharged today. HH arranged via [facility] on 2/3. Left VM with Tillie Rung that patient was discharging and to call CM back. DME 3:1. Tub/Shower chair, Wheelchair with specifics per provider to be delivered via [employer]. Daughter's number given to [employer] in case delivery has to be made to the home where patient is being taken by family (daughter's home). Spoke with daughter directly and went over the plan of contacting [facility] first thing Monday and that [employer] may deliver to home. She was okay with that and appreciative of the calls. Simmie Davies RN CM    Final next level of care: [place] Barriers to Discharge: Barriers Resolved   Patient Goals and CMS Choice Patient states their goals for this hospitalization and ongoing recovery are:: home with home health CMS Medicare.gov Compare Post Acute Care list provided to:: Patient Represenative (must comment) Choice offered to / list presented to : Adult Children  Discharge Placement                       Discharge Plan and Services                DME Arranged: 3-N-1 DME Agency: [facility] Date DME Agency Contacted: 06/27/20 Time DME Agency Contacted: [id number] Representative spoke with at DME Agency: Pura Spice HH Arranged: RN,PT,Nurse's [place]: [facility] Date [place]: 06/25/20 Time [place]: [id number] (Per CM notes on 2/3) Representative spoke with at [place]:  (Not indicated, just [facility])  Social Determinants of Health (SDOH) Interventions     Readmission Risk Interventions Readmission Risk Prevention Plan 06/24/2020  Transportation Screening Complete  Medication Review Press photographer) Complete  PCP or Specialist  appointment within 3-5 days of discharge Complete  HRI or Home Care Consult Complete  SW Recovery Care/Counseling Consult Complete  Palliative Care Screening Not [place] Complete  Some recent data might be hidden

## 2020-06-27 NOTE — Discharge Summary (Signed)
[facility] NAME: Scott Oconnor    MR#:  [id number]  DATE OF BIRTH:  08-Jun-1945  DATE OF ADMISSION:  06/18/2020 ADMITTING PHYSICIAN: Orene Desanctis, DO  DATE OF DISCHARGE: 06/27/2020  PRIMARY CARE PHYSICIAN: [name], MD    ADMISSION DIAGNOSIS:  Acute GI bleeding [K92.2] Generalized weakness [R53.1] Gastrointestinal hemorrhage, unspecified gastrointestinal hemorrhage type [K92.2] COVID-19 [U07.1]  DISCHARGE DIAGNOSIS:  Active Problems:   Essential hypertension   HFrEF (heart failure with reduced ejection fraction) (HCC)   End stage renal disease on dialysis ([place])   Acute blood loss anemia   [id number]   Toxic metabolic encephalopathy   Lobar pneumonia ([place])   Sepsis with encephalopathy without septic shock ([place])   SECONDARY DIAGNOSIS:   Past Medical History:  Diagnosis Date  . Hyperlipidemia   . Hypertension   . PVD (peripheral vascular disease) ([place])   . Renal disorder    ESRD  . Reynolds syndrome [facility])     HOSPITAL COURSE:   1.  Acute blood loss anemia.  Patient was seen by gastroenterology and did not recommend any procedures at this time.  Empiric Protonix IV was given be switched over to Protonix orally upon disposition.  Patient received a 2 units of packed red blood cells during the hospital course.  Patient received a unit of packed red blood cells prior to disposition on a hemoglobin of 7.2.  Eliquis stopped. 2.  Left lobar pneumonia, sepsis starting on 06/24/2020 with fever, tachycardia tachypnea and acute toxic metabolic encephalopathy.  The patient was started on Zosyn.  Blood cultures are negative.  Initially gave vancomycin 1 dose but MRSA PCR was negative so that was discontinued.  Will finish up a course with Augmentin at home. 3.  Covid positive infection with pneumonia.  I started Solu-Medrol the other day.  Will finish up a course of prednisone at home.  Family did not want to give remdesivir  while here. 4.  Acute toxic metabolic encephalopathy secondary to uremia and then sepsis.  Patient awake and answers some yes or no questions.  Patient was started on hemodialysis here and antibiotics. 5.  Chronic systolic congestive heart failure.  Dialysis to manage fluid.  Continue low-dose Coreg 6.  Essential hypertension on low-dose Coreg 7.  Uremia with end-stage renal disease.  Patient does PD dialysis at home and needed to be converted to hemodialysis.  Patient will go back to PD dialysis at home.  BUN and creatinine did improve to 48 and 6.39 upon disposition. 8.  Weakness.  Patient has not done much with physical therapy.  Family wants to take him home and set up home health. 9.  Failure to thrive and poor appetite 10.  Appreciate palliative care consultation during hospital course.  We will have palliative care to follow-up as outpatient.  Case discussed at length with patient and he wants to go home.  Case discussed at length with the patient's daughter and they say they have good support at home and they are not concerned that he is not moving around.  They think he will get better in his home environment and he will eat more.  Case discussed with nephrology. We will set up home health and wheelchair.  They have other equipment at home.  They will go back on PD dialysis at home.  Patient is at high risk for readmission.  Palliative to follow as outpatient.  DISCHARGE CONDITIONS:   Guarded  CONSULTS OBTAINED:  Nephrology  DRUG ALLERGIES:   Allergies  Allergen Reactions  . Ivp Dye [Iodinated Diagnostic Agents]     ESRD patient     DISCHARGE MEDICATIONS:   Allergies as of 06/27/2020      Reactions   Ivp Dye [iodinated Diagnostic Agents]    ESRD patient       Medication List    STOP taking these medications   apixaban 2.5 MG Tabs tablet Commonly known as: Eliquis   atorvastatin 40 MG tablet Commonly known as: LIPITOR   azithromycin 250 MG tablet Commonly known  as: ZITHROMAX   gabapentin 300 MG capsule Commonly known as: NEURONTIN   hydrALAZINE 50 MG tablet Commonly known as: APRESOLINE   irbesartan 150 MG tablet Commonly known as: AVAPRO   torsemide 20 MG tablet Commonly known as: DEMADEX     TAKE these medications   acetaminophen 500 MG tablet Commonly known as: TYLENOL Take 500-1,000 mg by mouth every 8 (eight) hours as needed for mild pain or moderate pain.   amoxicillin-clavulanate 875-125 MG tablet Commonly known as: Augmentin Take 1 tablet by mouth 2 (two) times daily for 4 days.   benzonatate 100 MG capsule Commonly known as: TESSALON Take 1 capsule (100 mg total) by mouth every 8 (eight) hours.   calcium carbonate 500 MG chewable tablet Commonly known as: TUMS - dosed in mg elemental calcium Chew 1 tablet by mouth 3 (three) times daily as needed for heartburn.   carvedilol 3.125 MG tablet Commonly known as: COREG Take 1 tablet (3.125 mg total) by mouth 2 (two) times daily with a meal. What changed:   medication strength  how much to take   lidocaine 2 % solution Commonly known as: XYLOCAINE Use as directed 15 mLs in the mouth or throat every 4 (four) hours as needed for mouth pain.   multivitamin Tabs tablet Take 1 tablet by mouth at bedtime.   ondansetron 4 MG tablet Commonly known as: Zofran Take 1 tablet (4 mg total) by mouth daily as needed for nausea or vomiting.   pantoprazole 40 MG tablet Commonly known as: Protonix Take 1 tablet 2 times a day for 30 days, then change to 1 tablet a day   predniSONE 10 MG tablet Commonly known as: DELTASONE Two tabs po daily for six days What changed:   how much to take  how to take this  when to take this  additional instructions            Durable Medical Equipment  (From admission, onward)         Start     Ordered   06/27/20 1444  For home use only DME lightweight manual wheelchair with seat cushion  Once       Comments: Patient suffers from   Peripheral vascular disease which impairs their ability to perform daily activities like walking in the home.  A walker will not resolve  issue with performing activities of daily living. A wheelchair will allow patient to safely perform daily activities. Patient is not able to propel themselves in the home using a standard weight wheelchair due to weakness. Patient can self propel in the lightweight wheelchair. Length of need lifetime. Accessories: elevating leg rests (ELRs), wheel locks, extensions and anti-tippers.   06/27/20 1444           DISCHARGE INSTRUCTIONS:   Follow-up PCP Follow-up nephrology 1 week Follow-up cardiology  If you experience worsening of your admission symptoms, develop shortness of breath, life threatening emergency, suicidal or  homicidal thoughts you must seek medical attention immediately by calling 911 or calling your MD immediately  if symptoms less severe.  You Must read complete instructions/literature along with all the possible adverse reactions/side effects for all the Medicines you take and that have been prescribed to you. Take any new Medicines after you have completely understood and accept all the possible adverse reactions/side effects.   Please note  You were cared for by a hospitalist during your hospital stay. If you have any questions about your discharge medications or the care you received while you were in the hospital after you are discharged, you can call the unit and asked to speak with the hospitalist on call if the hospitalist that took care of you is not available. Once you are discharged, your primary care physician will handle any further medical issues. Please note that NO REFILLS for any discharge medications will be authorized once you are discharged, as it is imperative that you return to your primary care physician (or establish a relationship with a primary care physician if you do not have one) for your aftercare needs so that they  can reassess your need for medications and monitor your lab values.    Today   CHIEF COMPLAINT:   Chief Complaint  Patient presents with  . Emesis  . Back Pain    HISTORY OF PRESENT ILLNESS:  Scott Oconnor  is a 75 y.o. male came in initially with vomiting and back pain   VITAL SIGNS:  Blood pressure (!) 157/76, pulse 79, temperature 97.8 F (36.6 C), resp. rate (!) 22, height '5\' 10"'$  (1.778 m), weight 54.9 kg, SpO2 91 %.   PHYSICAL EXAMINATION:  GENERAL:  75 y.o.-year-old patient lying in the bed with no acute distress.  EYES: Pupils equal, round, reactive to light and accommodation. No scleral icterus. HEENT: Head atraumatic, normocephalic. Oropharynx and nasopharynx clear.  LUNGS: Decreased breath sounds bilaterally, no wheezing, rales,rhonchi or crepitation.  CARDIOVASCULAR: S1, S2 normal. No murmurs, rubs, or gallops.  ABDOMEN: Soft, non-tender, non-distended.  EXTREMITIES: No pedal edema, cyanosis, or clubbing. PSYCHIATRIC: The patient is alert and answers a few simple yes/no questions.  SKIN: No obvious rash, lesion, or ulcer.   DATA REVIEW:   CBC Recent Labs  Lab 06/27/20 0618  WBC 7.8  HGB 7.2*  HCT 21.6*  PLT 133*    Chemistries  Recent Labs  Lab 06/24/20 2159 06/25/20 0759 06/26/20 0536  NA 136   < > 134*  K 3.8   < > 4.9  CL 95*   < > 93*  CO2 21*   < > 23  GLUCOSE 87   < > 168*  BUN 51*   < > 48*  CREATININE 8.46*   < > 6.39*  CALCIUM 7.6*   < > 8.1*  AST 62*  --   --   ALT 10  --   --   ALKPHOS 44  --   --   BILITOT 1.1  --   --    < > = values in this interval not displayed.     Microbiology Results  Results for orders placed or performed during the hospital encounter of 06/18/20  Culture, blood (x 2)     Status: None (Preliminary result)   Collection Time: 06/24/20 10:00 PM   Specimen: BLOOD  Result Value Ref Range Status   Specimen Description BLOOD BLOOD LEFT HAND  Final   Special Requests   Final    BOTTLES DRAWN  AEROBIC ONLY Blood Culture results may not be optimal due to an inadequate volume of blood received in culture bottles   Culture   Final    NO GROWTH 3 DAYS Performed at [facility], [address] [place], [place] [address]    Report Status PENDING  Incomplete  Culture, blood (x 2)     Status: None (Preliminary result)   Collection Time: 06/24/20 10:01 PM   Specimen: BLOOD  Result Value Ref Range Status   Specimen Description BLOOD BLOOD RIGHT HAND  Final   Special Requests   Final    BOTTLES DRAWN AEROBIC ONLY Blood Culture results may not be optimal due to an inadequate volume of blood received in culture bottles   Culture   Final    NO GROWTH 3 DAYS Performed at [facility], [address] [place], [place] [address]    Report Status PENDING  Incomplete  MRSA PCR Screening     Status: None   Collection Time: 06/25/20  6:27 PM   Specimen: Nasopharyngeal  Result Value Ref Range Status   MRSA by PCR NEGATIVE NEGATIVE Final    Comment:        The GeneXpert MRSA Assay (FDA approved for NASAL specimens only), is one component of a comprehensive MRSA colonization surveillance program. It is not intended to diagnose MRSA infection nor to guide or monitor treatment for MRSA infections. Performed at [facility], [address] [place], [place] [address]       Management plans discussed with the patient, family and they want to go home today.  CODE STATUS:     Code Status Orders  (From admission, onward)         Start     Ordered   06/18/20 2122  Full code  Continuous        06/18/20 2123        Code Status History    Date Active Date Inactive Code Status Order ID Comments User Context   05/06/2020 0355 05/06/2020 2301 Full Code [id number]  Athena Masse, MD ED   04/10/2020 1913 04/11/2020 2230 Full Code [id number]  Cox, Amy N, DO ED   11/19/2019 0051 11/23/2019 2133 Full Code [id number]  Nelle Don, MD Inpatient   09/20/2019  1416 09/30/2019 2350 Full Code [id number]  Loletha Grayer, MD ED   08/14/2019 1132 08/15/2019 2113 Full Code [id number]  Schnier, Dolores Lory, MD Inpatient   Advance Care Planning Activity      TOTAL TIME TAKING CARE OF THIS PATIENT: 40 minutes.    Loletha Grayer M.D on 06/27/2020 at 3:06 PM  Between 7am to 6pm - Pager - [phone]  After 6pm go to [email or web address] - password [email or web address]  [facility]  CC: Primary care physician; [name], MD

## 2020-06-28 ENCOUNTER — Inpatient Hospital Stay: Payer: Medicare Other

## 2020-06-28 ENCOUNTER — Emergency Department: Payer: Medicare Other

## 2020-06-28 ENCOUNTER — Inpatient Hospital Stay
Admission: EM | Admit: 2020-06-28 | Discharge: 2020-07-21 | DRG: 871 | Disposition: E | Payer: Medicare Other | Attending: Internal Medicine | Admitting: Internal Medicine

## 2020-06-28 ENCOUNTER — Other Ambulatory Visit: Payer: Self-pay

## 2020-06-28 DIAGNOSIS — Z136 Encounter for screening for cardiovascular disorders: Secondary | ICD-10-CM | POA: Diagnosis not present

## 2020-06-28 DIAGNOSIS — Z4682 Encounter for fitting and adjustment of non-vascular catheter: Secondary | ICD-10-CM | POA: Diagnosis not present

## 2020-06-28 DIAGNOSIS — D62 Acute posthemorrhagic anemia: Secondary | ICD-10-CM | POA: Diagnosis present

## 2020-06-28 DIAGNOSIS — J189 Pneumonia, unspecified organism: Secondary | ICD-10-CM | POA: Diagnosis not present

## 2020-06-28 DIAGNOSIS — I517 Cardiomegaly: Secondary | ICD-10-CM | POA: Diagnosis not present

## 2020-06-28 DIAGNOSIS — Z66 Do not resuscitate: Secondary | ICD-10-CM | POA: Diagnosis not present

## 2020-06-28 DIAGNOSIS — R918 Other nonspecific abnormal finding of lung field: Secondary | ICD-10-CM | POA: Diagnosis not present

## 2020-06-28 DIAGNOSIS — J811 Chronic pulmonary edema: Secondary | ICD-10-CM | POA: Diagnosis not present

## 2020-06-28 DIAGNOSIS — Z7189 Other specified counseling: Secondary | ICD-10-CM | POA: Diagnosis not present

## 2020-06-28 DIAGNOSIS — N186 End stage renal disease: Secondary | ICD-10-CM | POA: Diagnosis not present

## 2020-06-28 DIAGNOSIS — R778 Other specified abnormalities of plasma proteins: Secondary | ICD-10-CM | POA: Diagnosis not present

## 2020-06-28 DIAGNOSIS — D631 Anemia in chronic kidney disease: Secondary | ICD-10-CM | POA: Diagnosis present

## 2020-06-28 DIAGNOSIS — R627 Adult failure to thrive: Secondary | ICD-10-CM | POA: Diagnosis present

## 2020-06-28 DIAGNOSIS — T380X5A Adverse effect of glucocorticoids and synthetic analogues, initial encounter: Secondary | ICD-10-CM | POA: Diagnosis not present

## 2020-06-28 DIAGNOSIS — R6889 Other general symptoms and signs: Secondary | ICD-10-CM | POA: Diagnosis not present

## 2020-06-28 DIAGNOSIS — R531 Weakness: Secondary | ICD-10-CM | POA: Diagnosis not present

## 2020-06-28 DIAGNOSIS — E8770 Fluid overload, unspecified: Secondary | ICD-10-CM

## 2020-06-28 DIAGNOSIS — R5381 Other malaise: Secondary | ICD-10-CM | POA: Diagnosis not present

## 2020-06-28 DIAGNOSIS — I10 Essential (primary) hypertension: Secondary | ICD-10-CM | POA: Diagnosis not present

## 2020-06-28 DIAGNOSIS — I429 Cardiomyopathy, unspecified: Secondary | ICD-10-CM | POA: Diagnosis not present

## 2020-06-28 DIAGNOSIS — Z0189 Encounter for other specified special examinations: Secondary | ICD-10-CM | POA: Diagnosis not present

## 2020-06-28 DIAGNOSIS — I420 Dilated cardiomyopathy: Secondary | ICD-10-CM | POA: Diagnosis not present

## 2020-06-28 DIAGNOSIS — U071 COVID-19: Secondary | ICD-10-CM | POA: Diagnosis present

## 2020-06-28 DIAGNOSIS — I5022 Chronic systolic (congestive) heart failure: Secondary | ICD-10-CM

## 2020-06-28 DIAGNOSIS — J90 Pleural effusion, not elsewhere classified: Secondary | ICD-10-CM | POA: Diagnosis not present

## 2020-06-28 DIAGNOSIS — J1282 Pneumonia due to coronavirus disease 2019: Secondary | ICD-10-CM | POA: Diagnosis present

## 2020-06-28 DIAGNOSIS — A4189 Other specified sepsis: Secondary | ICD-10-CM | POA: Diagnosis not present

## 2020-06-28 DIAGNOSIS — Z8249 Family history of ischemic heart disease and other diseases of the circulatory system: Secondary | ICD-10-CM

## 2020-06-28 DIAGNOSIS — N2581 Secondary hyperparathyroidism of renal origin: Secondary | ICD-10-CM | POA: Diagnosis present

## 2020-06-28 DIAGNOSIS — J9601 Acute respiratory failure with hypoxia: Secondary | ICD-10-CM | POA: Diagnosis not present

## 2020-06-28 DIAGNOSIS — I132 Hypertensive heart and chronic kidney disease with heart failure and with stage 5 chronic kidney disease, or end stage renal disease: Secondary | ICD-10-CM | POA: Diagnosis not present

## 2020-06-28 DIAGNOSIS — R739 Hyperglycemia, unspecified: Secondary | ICD-10-CM | POA: Diagnosis not present

## 2020-06-28 DIAGNOSIS — I509 Heart failure, unspecified: Secondary | ICD-10-CM | POA: Diagnosis not present

## 2020-06-28 DIAGNOSIS — R636 Underweight: Secondary | ICD-10-CM | POA: Diagnosis present

## 2020-06-28 DIAGNOSIS — R630 Anorexia: Secondary | ICD-10-CM | POA: Diagnosis present

## 2020-06-28 DIAGNOSIS — Z681 Body mass index (BMI) 19 or less, adult: Secondary | ICD-10-CM

## 2020-06-28 DIAGNOSIS — Z992 Dependence on renal dialysis: Secondary | ICD-10-CM

## 2020-06-28 DIAGNOSIS — I7300 Raynaud's syndrome without gangrene: Secondary | ICD-10-CM | POA: Diagnosis present

## 2020-06-28 DIAGNOSIS — I5020 Unspecified systolic (congestive) heart failure: Secondary | ICD-10-CM | POA: Diagnosis not present

## 2020-06-28 DIAGNOSIS — Z79899 Other long term (current) drug therapy: Secondary | ICD-10-CM

## 2020-06-28 DIAGNOSIS — I5043 Acute on chronic combined systolic (congestive) and diastolic (congestive) heart failure: Secondary | ICD-10-CM | POA: Diagnosis present

## 2020-06-28 DIAGNOSIS — R6520 Severe sepsis without septic shock: Secondary | ICD-10-CM | POA: Diagnosis not present

## 2020-06-28 DIAGNOSIS — K922 Gastrointestinal hemorrhage, unspecified: Secondary | ICD-10-CM | POA: Diagnosis not present

## 2020-06-28 DIAGNOSIS — G928 Other toxic encephalopathy: Secondary | ICD-10-CM | POA: Diagnosis present

## 2020-06-28 DIAGNOSIS — E876 Hypokalemia: Secondary | ICD-10-CM | POA: Diagnosis present

## 2020-06-28 DIAGNOSIS — M7989 Other specified soft tissue disorders: Secondary | ICD-10-CM

## 2020-06-28 DIAGNOSIS — Z515 Encounter for palliative care: Secondary | ICD-10-CM | POA: Diagnosis not present

## 2020-06-28 DIAGNOSIS — J159 Unspecified bacterial pneumonia: Secondary | ICD-10-CM | POA: Diagnosis present

## 2020-06-28 DIAGNOSIS — Z87891 Personal history of nicotine dependence: Secondary | ICD-10-CM

## 2020-06-28 DIAGNOSIS — D696 Thrombocytopenia, unspecified: Secondary | ICD-10-CM | POA: Diagnosis present

## 2020-06-28 DIAGNOSIS — J9811 Atelectasis: Secondary | ICD-10-CM | POA: Diagnosis not present

## 2020-06-28 DIAGNOSIS — I214 Non-ST elevation (NSTEMI) myocardial infarction: Secondary | ICD-10-CM | POA: Diagnosis not present

## 2020-06-28 DIAGNOSIS — Z743 Need for continuous supervision: Secondary | ICD-10-CM | POA: Diagnosis not present

## 2020-06-28 DIAGNOSIS — Z83438 Family history of other disorder of lipoprotein metabolism and other lipidemia: Secondary | ICD-10-CM

## 2020-06-28 DIAGNOSIS — D649 Anemia, unspecified: Secondary | ICD-10-CM | POA: Diagnosis not present

## 2020-06-28 DIAGNOSIS — E872 Acidosis: Secondary | ICD-10-CM | POA: Diagnosis present

## 2020-06-28 DIAGNOSIS — R0603 Acute respiratory distress: Secondary | ICD-10-CM

## 2020-06-28 DIAGNOSIS — R54 Age-related physical debility: Secondary | ICD-10-CM | POA: Diagnosis present

## 2020-06-28 DIAGNOSIS — E785 Hyperlipidemia, unspecified: Secondary | ICD-10-CM | POA: Diagnosis present

## 2020-06-28 DIAGNOSIS — Z4659 Encounter for fitting and adjustment of other gastrointestinal appliance and device: Secondary | ICD-10-CM | POA: Diagnosis not present

## 2020-06-28 DIAGNOSIS — E875 Hyperkalemia: Secondary | ICD-10-CM | POA: Diagnosis present

## 2020-06-28 DIAGNOSIS — Z91041 Radiographic dye allergy status: Secondary | ICD-10-CM

## 2020-06-28 LAB — BPAM RBC
Blood Product Expiration Date: 202203062359
Blood Product Expiration Date: 202203072359
ISSUE DATE / TIME: 202202051127
Unit Type and Rh: 6200
Unit Type and Rh: 6200

## 2020-06-28 LAB — CBC WITH DIFFERENTIAL/PLATELET
Abs Immature Granulocytes: 0.24 10*3/uL — ABNORMAL HIGH (ref 0.00–0.07)
Basophils Absolute: 0 10*3/uL (ref 0.0–0.1)
Basophils Relative: 0 %
Eosinophils Absolute: 0 10*3/uL (ref 0.0–0.5)
Eosinophils Relative: 0 %
HCT: 31.4 % — ABNORMAL LOW (ref 39.0–52.0)
Hemoglobin: 10.7 g/dL — ABNORMAL LOW (ref 13.0–17.0)
Immature Granulocytes: 2 %
Lymphocytes Relative: 7 %
Lymphs Abs: 0.7 10*3/uL (ref 0.7–4.0)
MCH: 32.6 pg (ref 26.0–34.0)
MCHC: 34.1 g/dL (ref 30.0–36.0)
MCV: 95.7 fL (ref 80.0–100.0)
Monocytes Absolute: 0.7 10*3/uL (ref 0.1–1.0)
Monocytes Relative: 7 %
Neutro Abs: 9.1 10*3/uL — ABNORMAL HIGH (ref 1.7–7.7)
Neutrophils Relative %: 84 %
Platelets: 128 10*3/uL — ABNORMAL LOW (ref 150–400)
RBC: 3.28 MIL/uL — ABNORMAL LOW (ref 4.22–5.81)
RDW: 16.6 % — ABNORMAL HIGH (ref 11.5–15.5)
WBC: 10.8 10*3/uL — ABNORMAL HIGH (ref 4.0–10.5)
nRBC: 1.1 % — ABNORMAL HIGH (ref 0.0–0.2)

## 2020-06-28 LAB — LACTIC ACID, PLASMA: Lactic Acid, Venous: 1.5 mmol/L (ref 0.5–1.9)

## 2020-06-28 LAB — TYPE AND SCREEN
ABO/RH(D): A POS
Antibody Screen: NEGATIVE
Unit division: 0
Unit division: 0

## 2020-06-28 LAB — BASIC METABOLIC PANEL
Anion gap: 17 — ABNORMAL HIGH (ref 5–15)
BUN: 64 mg/dL — ABNORMAL HIGH (ref 8–23)
CO2: 24 mmol/L (ref 22–32)
Calcium: 7 mg/dL — ABNORMAL LOW (ref 8.9–10.3)
Chloride: 95 mmol/L — ABNORMAL LOW (ref 98–111)
Creatinine, Ser: 6.84 mg/dL — ABNORMAL HIGH (ref 0.61–1.24)
GFR, Estimated: 8 mL/min — ABNORMAL LOW (ref >60)
Glucose, Bld: 100 mg/dL — ABNORMAL HIGH (ref 70–99)
Potassium: 5.2 mmol/L — ABNORMAL HIGH (ref 3.5–5.1)
Sodium: 136 mmol/L (ref 135–145)

## 2020-06-28 LAB — CK: Total CK: 341 U/L (ref 49–397)

## 2020-06-28 MED ORDER — PIPERACILLIN-TAZOBACTAM 3.375 G IVPB 30 MIN
3.3750 g | Freq: Once | INTRAVENOUS | Status: AC
  Administered 2020-06-28: 3.375 g via INTRAVENOUS
  Filled 2020-06-28: qty 50

## 2020-06-28 MED ORDER — PREDNISONE 20 MG PO TABS: 20.0000 mg | ORAL_TABLET | Freq: Every day | ORAL | Status: DC

## 2020-06-28 MED ORDER — SODIUM CHLORIDE 0.9 % IV SOLN
200.0000 mg | Freq: Once | INTRAVENOUS | Status: AC
  Administered 2020-06-29: 01:00:00 200 mg via INTRAVENOUS
  Filled 2020-06-28: qty 200

## 2020-06-28 MED ORDER — HYDRALAZINE HCL 20 MG/ML IJ SOLN
10.0000 mg | Freq: Four times a day (QID) | INTRAMUSCULAR | Status: DC | PRN
  Administered 2020-06-28 – 2020-07-01 (×5): 10 mg via INTRAVENOUS
  Filled 2020-06-28 (×4): qty 1

## 2020-06-28 MED ORDER — RENA-VITE PO TABS
1.0000 | ORAL_TABLET | Freq: Every day | ORAL | Status: DC
  Administered 2020-06-30: 1 via ORAL
  Filled 2020-06-28 (×2): qty 1

## 2020-06-28 MED ORDER — CARVEDILOL 3.125 MG PO TABS
3.1250 mg | ORAL_TABLET | Freq: Two times a day (BID) | ORAL | Status: DC
  Filled 2020-06-28: qty 1

## 2020-06-28 MED ORDER — ALBUTEROL SULFATE (2.5 MG/3ML) 0.083% IN NEBU: 2.5000 mg | INHALATION_SOLUTION | RESPIRATORY_TRACT | Status: DC | PRN

## 2020-06-28 MED ORDER — PANTOPRAZOLE SODIUM 40 MG PO TBEC
40.0000 mg | DELAYED_RELEASE_TABLET | Freq: Two times a day (BID) | ORAL | Status: DC
  Administered 2020-06-29: 40 mg via ORAL

## 2020-06-28 MED ORDER — SODIUM CHLORIDE 0.9 % IV SOLN: Freq: Once | INTRAVENOUS | Status: AC

## 2020-06-28 MED ORDER — BENZONATATE 100 MG PO CAPS
100.0000 mg | ORAL_CAPSULE | Freq: Three times a day (TID) | ORAL | Status: DC
  Administered 2020-07-02 – 2020-07-03 (×2): 100 mg via ORAL
  Filled 2020-06-28 (×5): qty 1

## 2020-06-28 MED ORDER — SODIUM CHLORIDE 0.9 % IV SOLN
100.0000 mg | Freq: Every day | INTRAVENOUS | Status: AC
  Administered 2020-06-29 – 2020-07-02 (×4): 100 mg via INTRAVENOUS
  Filled 2020-06-28: qty 100
  Filled 2020-06-28 (×3): qty 20

## 2020-06-28 MED ORDER — ONDANSETRON HCL 4 MG PO TABS: 4.0000 mg | ORAL_TABLET | Freq: Four times a day (QID) | ORAL | Status: DC | PRN

## 2020-06-28 MED ORDER — ACETAMINOPHEN 500 MG PO TABS: 500.0000 mg | ORAL_TABLET | Freq: Three times a day (TID) | ORAL | Status: DC | PRN

## 2020-06-28 MED ORDER — ONDANSETRON HCL 4 MG/2ML IJ SOLN
4.0000 mg | Freq: Four times a day (QID) | INTRAMUSCULAR | Status: DC | PRN
  Administered 2020-06-29 – 2020-06-30 (×2): 4 mg via INTRAVENOUS
  Filled 2020-06-28 (×2): qty 2

## 2020-06-28 MED ORDER — PIPERACILLIN-TAZOBACTAM IN DEX 2-0.25 GM/50ML IV SOLN
2.2500 g | Freq: Three times a day (TID) | INTRAVENOUS | Status: AC
  Administered 2020-06-29 – 2020-07-02 (×11): 2.25 g via INTRAVENOUS
  Filled 2020-06-28 (×14): qty 50

## 2020-06-28 MED ORDER — SODIUM CHLORIDE 0.9% FLUSH
3.0000 mL | Freq: Two times a day (BID) | INTRAVENOUS | Status: DC
  Administered 2020-06-29 – 2020-07-04 (×13): 3 mL via INTRAVENOUS

## 2020-06-28 MED ORDER — SODIUM CHLORIDE 0.9 % IV SOLN: Freq: Once | INTRAVENOUS | Status: DC

## 2020-06-28 NOTE — H&P (Addendum)
History and Physical    Scott Oconnor [id number] DOB: Dec 14, 1945 DOA: 06/30/2020  PCP: [name], MD  Patient coming from: home  I have personally briefly reviewed patient's old medical records in [place]  Chief Complaint: generalized weakness , poor po intake  HPI: Scott Oconnor is a 75 y.o. male with medical history significant of ESRD on dialysis- was doing PD at home, now doing HD, anemia of CKD, HTN, CHF, PVD   raynaud's syndrome, hyperlipidemia  was admitted on 06/18/2020 for acute gi bleed, and COVID-19 pneumonia. Course further complicated by bacterial pna, sepsis,uremia and metabolic encephalopathy Patient was treated with solumedrol with plans for continued taper at home for his diagnosis of COVID-19,he was not given remdesivir due to family preference. He was treated with zosyn with plans for completion of course with Augmentin at home for his diagnosis of lobar pna. In reference to his GI bleed he was evaluated by gi who recommended conservative management, he wasgiven 3 unit of PRBC during his hospitalization.  Of note medical team recommended further inpatient stay with transition to rehab due to patient significant weakness, and poor po intake, however family and patient opted for discharge home. Patient was discharged on 06/27/2020. Patient now returns to ED one day later due to family noting continued weakness and continued poor po intake since discharge. Per daughter home health nurse recommended return to ED for evaluation base on patient status at home.Of note patient also was seen by palliative care as inpatient and patient remains a full code with full scope of care.Per daughter patient complaints of diffuse body aches, as well as nausea. Patient also states he has abdominal pain. Per daughter patient has had 2 bm since being home both w/o black stools, or brb. He notes no sob. But continues per daughter to be very weak and lethargic. ED Course:  Vitals   Afeb, bp 190/78   Sat 97% on ra, HR 575 ECG:nsr, no hyperacute st -twave changes Labs: Wbc:10.8, hgb 10.7 up from prior of 7.2,plt 128, cr 16.4 down from prior of 27 NA136, K5.2,  Cr 6.8 around baseline Lactic 1.4 Cxr: 1. Similar diffuse bilateral interstitial prominence consistent with interstitial edema, infection or pneumonitis. Decreased small left pleural effusion and left basilar opacification, likely atelectasis. 2. Pneumoperitoneum again visualized. tx zosyn,tylenol,zofran  Review of Systems: As per HPI otherwise 10 point review of systems negative.   Past Medical History:  Diagnosis Date  . Hyperlipidemia   . Hypertension   . PVD (peripheral vascular disease) ([place])   . Renal disorder    ESRD  . Reynolds syndrome [facility])     Past Surgical History:  Procedure Laterality Date  . DIALYSIS/PERMA CATHETER INSERTION N/A 09/26/2019   Procedure: DIALYSIS/PERMA CATHETER INSERTION;  Surgeon: Algernon Huxley, MD;  Location: [place] [facility];  Service: Cardiovascular;  Laterality: N/A;  . DIALYSIS/PERMA CATHETER INSERTION N/A 06/23/2020   Procedure: DIALYSIS/PERMA CATHETER INSERTION;  Surgeon: Algernon Huxley, MD;  Location: [place] [facility];  Service: Cardiovascular;  Laterality: N/A;  . DIALYSIS/PERMA CATHETER REMOVAL N/A 06/19/2020   Procedure: DIALYSIS/PERMA CATHETER REMOVAL;  Surgeon: Algernon Huxley, MD;  Location: [place] [facility];  Service: Cardiovascular;  Laterality: N/A;  . ENDOVASCULAR REPAIR/STENT GRAFT N/A 08/14/2019   Procedure: ENDOVASCULAR REPAIR/STENT GRAFT;  Surgeon: Katha Cabal, MD;  Location: [place] [facility] LAB;  Service: Cardiovascular;  Laterality: N/A;     reports that he has quit smoking. His smoking use included cigarettes. He smoked  0.33 packs per day. He has never used smokeless tobacco. He reports that he does not drink alcohol and does not use drugs.  Allergies  Allergen Reactions  . Ivp Dye [Iodinated Diagnostic Agents]     ESRD patient      Family History  Problem Relation Age of Onset  . Heart disease Father   . Sudden Cardiac Death Father   . Heart disease Brother   . Hyperlipidemia Brother   . Heart disease Sister   . Heart disease Brother   . Hyperlipidemia Brother   . Hyperlipidemia Brother   . Hyperlipidemia Brother   . Hyperlipidemia Brother   . Hyperlipidemia Brother   . Heart disease Sister     Prior to Admission medications   Medication Sig Start Date End Date Taking? Authorizing Provider  acetaminophen (TYLENOL) 500 MG tablet Take 500-1,000 mg by mouth every 8 (eight) hours as needed for mild pain or moderate pain.     [provider]  amoxicillin-clavulanate (AUGMENTIN) 875-125 MG tablet Take 1 tablet by mouth 2 (two) times daily for 4 days. 06/27/20 07/01/20  Loletha Grayer, MD  benzonatate (TESSALON) 100 MG capsule Take 1 capsule (100 mg total) by mouth every 8 (eight) hours. 06/27/20   Loletha Grayer, MD  calcium carbonate (TUMS - DOSED IN MG ELEMENTAL CALCIUM) 500 MG chewable tablet Chew 1 tablet by mouth 3 (three) times daily as needed for heartburn.    [provider]  carvedilol (COREG) 3.125 MG tablet Take 1 tablet (3.125 mg total) by mouth 2 (two) times daily with a meal. 06/27/20   Leslye Peer, Richard, MD  lidocaine (XYLOCAINE) 2 % solution Use as directed 15 mLs in the mouth or throat every 4 (four) hours as needed for mouth pain. 06/27/20   Loletha Grayer, MD  multivitamin (RENA-VIT) TABS tablet Take 1 tablet by mouth at bedtime. 09/30/19   Fritzi Mandes, MD  ondansetron (ZOFRAN) 4 MG tablet Take 1 tablet (4 mg total) by mouth daily as needed for nausea or vomiting. 06/27/20 06/27/21  Loletha Grayer, MD  pantoprazole (PROTONIX) 40 MG tablet Take 1 tablet 2 times a day for 30 days, then change to 1 tablet a day 06/27/20   Loletha Grayer, MD  predniSONE (DELTASONE) 10 MG tablet Two tabs po daily for six days 06/27/20   Loletha Grayer, MD    Physical Exam: Vitals:   07/07/2020 1830 07/12/2020  1900 07/13/2020 1930 07/04/2020 2000  BP: (!) 198/97 (!) 208/94 (!) 204/94 (!) 203/97  Pulse: 80 80 80 80  Resp: '15 17 17 '$ (!) 21  Temp:      TempSrc:      SpO2: 100% 97% 97% 98%  Weight:      Height:         Vitals:   06/24/2020 1830 07/01/2020 1900 07/08/2020 1930 07/16/2020 2000  BP: (!) 198/97 (!) 208/94 (!) 204/94 (!) 203/97  Pulse: 80 80 80 80  Resp: '15 17 17 '$ (!) 21  Temp:      TempSrc:      SpO2: 100% 97% 97% 98%  Weight:      Height:      Constitutional: NAD, calm, comfortable, ill appearing cachectic,lethargic ,slow congnition Eyes: PERRL, lids and conjunctivae normal ENMT: Mucous membranes unable to assess due to patient cooperation. Neck: normal, supple, no masses, no thyromegaly Respiratory: no wheezing, faint crackles. Normal respiratory effort. No accessory muscle use.  Cardiovascular: Regular rate and rhythm, no murmurs / rubs / gallops. No extremity edema. 2+  pedal pulses. No carotid bruits.  Abdomen: no tenderness, scaphoid, no masses palpated. No hepatosplenomegaly. Bowel sounds positive. Pd cath inplace Musculoskeletal: no clubbing / cyanosis. No joint deformity upper and lower extremities. no contractures. Moving all fours Skin: no rashes, lesions, ulcers. No induration Neurologic: CN 2-12 grossly intact. Sensation intact,Strength 5/5 in all 4.  Psychiatric: unable to assess, patient very lethargic  Labs on Admission: I have personally reviewed following labs and imaging studies  CBC: Recent Labs  Lab 06/24/20 2159 06/25/20 0759 06/26/20 0536 06/27/20 0618 07/09/2020 1725  WBC 9.9 9.8 5.5 7.8 10.8*  NEUTROABS 8.7*  --   --   --  9.1*  HGB 8.0* 8.1* 7.7* 7.2* 10.7*  HCT 23.8* 24.0* 23.1* 21.6* 31.4*  MCV 99.2 98.0 97.9 97.3 95.7  PLT 100* 112* 125* 133* 0000000*   Basic Metabolic Panel: Recent Labs  Lab 06/23/20 0559 06/24/20 0619 06/24/20 2159 06/25/20 0759 06/26/20 0536 06/27/20 1051 06/27/2020 1920  NA 143 140 136 137 134*  --  136  K 4.5 4.3 3.8 4.3 4.9   --  5.2*  CL 97* 98 95* 95* 93*  --  95*  CO2 22 20* 21* 22 23  --  24  GLUCOSE 132* 118* 87 83 168*  --  100*  BUN 116* 109* 51* 60* 48*  --  64*  CREATININE 15.88* 16.30* 8.46* 9.52* 6.39*  --  6.84*  CALCIUM 7.7* 7.1* 7.6* 7.6* 8.1*  --  7.0*  PHOS 9.1* 10.0*  --   --   --  6.4*  --    GFR: Estimated Creatinine Clearance: 7.5 mL/min (A) (by C-G formula based on SCr of 6.84 mg/dL (H)). Liver Function Tests: Recent Labs  Lab 06/22/20 0510 06/23/20 0559 06/24/20 0619 06/24/20 2159  AST 31  --   --  62*  ALT 9  --   --  10  ALKPHOS 42  --   --  44  BILITOT 0.8  --   --  1.1  PROT 5.7*  --   --  5.9*  ALBUMIN 2.5* 2.9* 2.5* 2.6*   No results for input(s): LIPASE, AMYLASE in the last 168 hours. No results for input(s): AMMONIA in the last 168 hours. Coagulation Profile: Recent Labs  Lab 06/24/20 2159  INR 1.1   Cardiac Enzymes: No results for input(s): CKTOTAL, CKMB, CKMBINDEX, TROPONINI in the last 168 hours. BNP (last 3 results) No results for input(s): PROBNP in the last 8760 hours. HbA1C: No results for input(s): HGBA1C in the last 72 hours. CBG: Recent Labs  Lab 06/23/20 0636  GLUCAP 111*   Lipid Profile: No results for input(s): CHOL, HDL, LDLCALC, TRIG, CHOLHDL, LDLDIRECT in the last 72 hours. Thyroid Function Tests: No results for input(s): TSH, T4TOTAL, FREET4, T3FREE, THYROIDAB in the last 72 hours. Anemia Panel: No results for input(s): VITAMINB12, FOLATE, FERRITIN, TIBC, IRON, RETICCTPCT in the last 72 hours. Urine analysis:    Component Value Date/Time   COLORURINE YELLOW (A) 09/20/2019 1418   APPEARANCEUR CLEAR (A) 09/20/2019 1418   LABSPEC 1.009 09/20/2019 1418   PHURINE 6.0 09/20/2019 1418   GLUCOSEU NEGATIVE 09/20/2019 1418   HGBUR NEGATIVE 09/20/2019 1418   BILIRUBINUR NEGATIVE 09/20/2019 1418   BILIRUBINUR negative 09/19/2019 1029   KETONESUR NEGATIVE 09/20/2019 1418   PROTEINUR >=300 (A) 09/20/2019 1418   UROBILINOGEN 0.2 09/19/2019  1029   NITRITE NEGATIVE 09/20/2019 1418   LEUKOCYTESUR NEGATIVE 09/20/2019 1418    Radiological Exams on Admission: DG Chest Portable 1  View  Result Date: 07/01/2020 CLINICAL DATA:  Weakness, failure to thrive EXAM: PORTABLE CHEST 1 VIEW COMPARISON:  Chest radiograph June 25, 2020 FINDINGS: Unchanged position of the left approach central venous catheter. Similar cardiomegaly. Similar diffuse bilateral interstitial prominence consistent with interstitial edema, infection or pneumonitis. Decreased opacification base. Decreased small left pleural effusion. The visualized skeletal structures are unchanged. Pneumoperitoneum again visualized. IMPRESSION: 1. Similar diffuse bilateral interstitial prominence consistent with interstitial edema, infection or pneumonitis. Decreased small left pleural effusion and left basilar opacification, likely atelectasis. 2. Pneumoperitoneum again visualized. Electronically Signed   By: Dahlia Bailiff MD   On: 07/10/2020 17:29    EKG: Independently reviewed. See above  Assessment/Plan Failure to thrive /acute on chronic debility related to acute medical illness -supportive care  -treat underlying cause  - PT/OT -nutrition consult for further assistance  -family goal for return home with services  POA Covid Encephalopathy in background of uremia -persistent -to be complete will get head CT w/o contrast -ammonia -hold sedative medications -neuro checks -to be complete can consider MRI head for further evaluation if encephalopathy persist  COVID -19  PNA -continue po steroid taper as previously planned  -patient w/o need or O2 at this time  -monitor saturations  -crp noted to be on down ward trend ,appears infection is resolving -patient requesting remdesivir - ordered  Lobar Bacterial PNA -will complete course with IV zosyn for total of 7 days -supportive care  -pulmonary toilet  -does not appear to be progressing , respiratory symptoms appear  stable  ESRD on PD -renal consult  For HD per schedule  -mild hyperkalemia  -monitor labs  CHF -cxr with overload  -managed with HD  Recent GI bleed  -no further recurrence  -h/h stable   HTN  -uncontrolled , due to fluid and needing HD -prn medications -CT head  Pending for continued change in mental status   DVT prophylaxis: scd Code Status: full Family Communication:  Family at beside Yameen, [place] (Daughter)  [phone] ([place]) Disposition Plan: patient  expected to be admitted greater than 2 midnights Consults called:  Renal  Admission status: inpatient  Clance Boll MD [facility]  If 7PM-7AM, please contact night-coverage [email or web address] Password [email or web address]  07/16/2020, 9:30 PM

## 2020-06-28 NOTE — ED Provider Notes (Signed)
[facility] Emergency Department Provider Note   ____________________________________________   I have reviewed the triage vital signs and the nursing notes.   HISTORY  Chief Complaint Weakness and Failure To Thrive   History limited by and level 5 caveat due to: AMS   HPI Scott Oconnor is a 75 y.o. male who presents to the emergency department today because of concern for continued weakness and failure to thrive at home. The patient is unable to give any significant history. The patient was discharged from the hospital yesterday after an admission complicated by COVID/PNA with sepsis/encephalopathy/decreased oral intake/GI bleed. Per chart review it does sound like discharging physician did have concern about discharge but that patient wanted to go home. Discharging physician was concerned that pateint was at high risk for readmission.   Records reviewed. Per medical record review patient had complicated admission with discharge yesterday.   Past Medical History:  Diagnosis Date  . Hyperlipidemia   . Hypertension   . PVD (peripheral vascular disease) ([place])   . Renal disorder    ESRD  . Reynolds syndrome [facility])     Patient Active Problem List   Diagnosis Date Noted  . Sepsis with encephalopathy without septic shock ([place])   . Lobar pneumonia ([place])   . Acute blood loss anemia   . COVID-19   . Toxic metabolic encephalopathy   . Anemia of chronic kidney failure, stage 5 ([place]) 05/06/2020  . Acute on chronic systolic CHF (congestive heart failure) ([place]) 05/06/2020  . Hypertensive emergency 05/06/2020  . Elevated troponin 05/06/2020  . Acute CHF (congestive heart failure) ([place]) 05/06/2020  . Acute respiratory failure with hypoxia ([place]) 05/06/2020  . Chronic anticoagulation 05/06/2020  . End stage renal disease on dialysis ([place]) 04/11/2020  . Altered mental status 04/10/2020  . HFrEF (heart failure with reduced ejection fraction) ([place])   . Acute  pulmonary edema ([place]) 11/18/2019  . Protein-calorie malnutrition, severe 09/27/2019  . Acute kidney failure, unspecified ([place])   . History of AAA (abdominal aortic aneurysm) repair   . Anemia   . Thrombocytopenia ([place])   . Acute kidney injury superimposed on CKD ([place])   . Essential hypertension   . Hyperlipidemia   . Fatigue 09/19/2019  . AAA (abdominal aortic aneurysm) ([place]) 08/14/2019  . AAA (abdominal aortic aneurysm) without rupture ([place]) 08/01/2019  . PVD (peripheral vascular disease) ([place]) 07/24/2019  . Tobacco abuse 07/24/2019  . Raynaud's disease without gangrene 06/21/2019  . Mixed dyslipidemia 06/15/2018  . Family history of heart attack 06/15/2018  . Smokers' cough ([place]) 06/16/2017    Past Surgical History:  Procedure Laterality Date  . DIALYSIS/PERMA CATHETER INSERTION N/A 09/26/2019   Procedure: DIALYSIS/PERMA CATHETER INSERTION;  Surgeon: Algernon Huxley, MD;  Location: [place] [facility] LAB;  Service: Cardiovascular;  Laterality: N/A;  . DIALYSIS/PERMA CATHETER INSERTION N/A 06/23/2020   Procedure: DIALYSIS/PERMA CATHETER INSERTION;  Surgeon: Algernon Huxley, MD;  Location: [place] CV LAB;  Service: Cardiovascular;  Laterality: N/A;  . DIALYSIS/PERMA CATHETER REMOVAL N/A 06/19/2020   Procedure: DIALYSIS/PERMA CATHETER REMOVAL;  Surgeon: Algernon Huxley, MD;  Location: [place] CV LAB;  Service: Cardiovascular;  Laterality: N/A;  . ENDOVASCULAR REPAIR/STENT GRAFT N/A 08/14/2019   Procedure: ENDOVASCULAR REPAIR/STENT GRAFT;  Surgeon: Katha Cabal, MD;  Location: [place] [facility];  Service: Cardiovascular;  Laterality: N/A;    Prior to Admission medications   Medication Sig Start Date End Date Taking? Authorizing Provider  acetaminophen (TYLENOL) 500 MG tablet Take 500-1,000 mg  by mouth every 8 (eight) hours as needed for mild pain or moderate pain.     [provider]  amoxicillin-clavulanate (AUGMENTIN) 875-125 MG tablet Take 1 tablet by mouth 2 (two)  times daily for 4 days. 06/27/20 07/01/20  Loletha Grayer, MD  benzonatate (TESSALON) 100 MG capsule Take 1 capsule (100 mg total) by mouth every 8 (eight) hours. 06/27/20   Loletha Grayer, MD  calcium carbonate (TUMS - DOSED IN MG ELEMENTAL CALCIUM) 500 MG chewable tablet Chew 1 tablet by mouth 3 (three) times daily as needed for heartburn.    [provider]  carvedilol (COREG) 3.125 MG tablet Take 1 tablet (3.125 mg total) by mouth 2 (two) times daily with a meal. 06/27/20   Leslye Peer, Richard, MD  lidocaine (XYLOCAINE) 2 % solution Use as directed 15 mLs in the mouth or throat every 4 (four) hours as needed for mouth pain. 06/27/20   Loletha Grayer, MD  multivitamin (RENA-VIT) TABS tablet Take 1 tablet by mouth at bedtime. 09/30/19   Fritzi Mandes, MD  ondansetron (ZOFRAN) 4 MG tablet Take 1 tablet (4 mg total) by mouth daily as needed for nausea or vomiting. 06/27/20 06/27/21  Loletha Grayer, MD  pantoprazole (PROTONIX) 40 MG tablet Take 1 tablet 2 times a day for 30 days, then change to 1 tablet a day 06/27/20   Loletha Grayer, MD  predniSONE (DELTASONE) 10 MG tablet Two tabs po daily for six days 06/27/20   Loletha Grayer, MD    Allergies Ivp dye [iodinated diagnostic agents]  Family History  Problem Relation Age of Onset  . Heart disease Father   . Sudden Cardiac Death Father   . Heart disease Brother   . Hyperlipidemia Brother   . Heart disease Sister   . Heart disease Brother   . Hyperlipidemia Brother   . Hyperlipidemia Brother   . Hyperlipidemia Brother   . Hyperlipidemia Brother   . Hyperlipidemia Brother   . Heart disease Sister     Social History Social History   Tobacco Use  . Smoking status: Former Smoker    Packs/day: 0.33    Types: Cigarettes  . Smokeless tobacco: Never Used  . Tobacco comment: 6 cigarettes daily   Vaping Use  . Vaping Use: Never used  Substance Use Topics  . Alcohol use: No  . Drug use: No    Review of Systems Unable to obtain   ____________________________________________   PHYSICAL EXAM:  VITAL SIGNS: ED Triage Vitals  Enc Vitals Group     BP 07/18/2020 1635 (!) 193/78     Pulse Rate 06/27/2020 1635 68     Resp 07/07/2020 1635 16     Temp 07/15/2020 1635 98.9 F (37.2 C)     Temp Source 07/19/2020 1635 Oral     SpO2 07/04/2020 1635 97 %     Weight 07/12/2020 1636 121 lb 4.1 oz (55 kg)     Height 07/02/2020 1636 '5\' 10"'$  (1.778 m)     Head Circumference --      Peak Flow --      Pain Score 07/12/2020 1636 0   Constitutional: Awake and alert. Minimally verbally responsive. Eyes: Conjunctivae are normal.  ENT      Head: Normocephalic and atraumatic.      Nose: No congestion/rhinnorhea.      Mouth/Throat: Mucous membranes are moist.      Neck: No stridor. Hematological/Lymphatic/Immunilogical: No cervical lymphadenopathy. Cardiovascular: Normal rate, regular rhythm.  No murmurs, rubs, or gallops.  Respiratory:  Normal respiratory effort without tachypnea nor retractions. Breath sounds are clear and equal bilaterally. No wheezes/rales/rhonchi. Gastrointestinal: Soft and non tender. No rebound. No guarding.  Genitourinary: Deferred Musculoskeletal: Normal range of motion in all extremities. No lower extremity edema. Neurologic: Awake and alert. Minimally verbally responsive.  Skin:  Skin is warm, dry and intact. No rash noted.  ____________________________________________    LABS (pertinent positives/negatives)  Lactic acid 1.5 BMP na 136, k 5.2, glu 100, cr 6.84 CBC wbc 10.8, hgb 10.7, plt 128  ____________________________________________   EKG  None  ____________________________________________    RADIOLOGY  CXR Diffuse bilateral interstitial prominence concerning for inflammation vs infection. ____________________________________________   PROCEDURES  Procedures  ____________________________________________   INITIAL IMPRESSION / ASSESSMENT AND PLAN / ED COURSE  Pertinent labs & imaging  results that were available during my care of the patient were reviewed by me and considered in my medical decision making (see chart for details).   Patient presents to the emergency department today because of concerns for failure to thrive at home.  Patient was discharged from the hospital yesterday.  It does sound like patient is similar in presentation today as he has been in the hospital.  Will restart IV antibiotics.  Will plan on admission.  ____________________________________________   FINAL CLINICAL IMPRESSION(S) / ED DIAGNOSES  Final diagnoses:  COVID-19  Pneumonia due to infectious organism, unspecified laterality, unspecified part of lung  Failure to thrive in adult     Note: This dictation was prepared with Dragon dictation. Any transcriptional errors that result from this process are unintentional     [place] Pear, MD 07/07/2020 2132

## 2020-06-28 NOTE — ED Triage Notes (Signed)
Pt presents to the [facility] via EMS from home with c/o generalized weakness and failure to thrive. EMS was notified by family because pt has been generally weak with no oral intake since being discharge from here yesterday. Pt was admitted for GI bleed, COVID, and urosepsis.

## 2020-06-28 NOTE — Progress Notes (Signed)
Pharmacy Antibiotic Note  Scott Oconnor is a 75 y.o. male admitted on 06/27/2020 with pneumonia.  Pharmacy has been consulted for Zoysn dosing. Pt was on PD at home but will be on HD in [facility].   Plan: Zosyn 3.375 gm IV X 1 given in ED on 2/6 @ ~ 1900.  Zosyn 2.25 gm IV Q8H ordered to start on 2/7 @ 0300.   Height: '5\' 10"'$  (177.8 cm) Weight: 55 kg (121 lb 4.1 oz) IBW/kg (Calculated) : 73  Temp (24hrs), Avg:98.9 F (37.2 C), Min:98.9 F (37.2 C), Max:98.9 F (37.2 C)  Recent Labs  Lab 06/24/20 0619 06/24/20 2159 06/24/20 2201 06/25/20 0043 06/25/20 0759 06/26/20 0536 06/27/20 0618 06/25/2020 1725 07/01/2020 1920  WBC 9.2 9.9  --   --  9.8 5.5 7.8 10.8*  --   CREATININE 16.30* 8.46*  --   --  9.52* 6.39*  --   --  6.84*  LATICACIDVEN  --   --  1.5 1.2  --   --   --  1.5  --     Estimated Creatinine Clearance: 7.5 mL/min (A) (by C-G formula based on SCr of 6.84 mg/dL (H)).    Allergies  Allergen Reactions  . Ivp Dye [Iodinated Diagnostic Agents]     ESRD patient     Antimicrobials this admission:   >>    >>   Dose adjustments this admission:   Microbiology results:  BCx:   UCx:    Sputum:    MRSA PCR:   Thank you for allowing pharmacy to be a part of this patient's care.  Robbins,Jason D 07/19/2020 11:44 PM

## 2020-06-28 NOTE — Progress Notes (Signed)
Remdesivir - Pharmacy Brief Note   O:  ALT:  CXR: Similar diffuse bilateral interstitial prominence consistent with interstitial edema, infection or pneumonitis. Decreased small left pleural effusion and left basilar opacification, likely atelectasis. SpO2: 97  % on RA    A/P:  Remdesivir 200 mg IVPB once followed by 100 mg IVPB daily x 4 days.   Chandler Stofer D 07/07/2020 11:38 PM

## 2020-06-29 ENCOUNTER — Inpatient Hospital Stay: Payer: Medicare Other

## 2020-06-29 ENCOUNTER — Encounter: Payer: Self-pay | Admitting: Internal Medicine

## 2020-06-29 DIAGNOSIS — R630 Anorexia: Secondary | ICD-10-CM | POA: Diagnosis not present

## 2020-06-29 DIAGNOSIS — R627 Adult failure to thrive: Secondary | ICD-10-CM | POA: Diagnosis not present

## 2020-06-29 DIAGNOSIS — J189 Pneumonia, unspecified organism: Secondary | ICD-10-CM

## 2020-06-29 LAB — CBC WITH DIFFERENTIAL/PLATELET
Abs Immature Granulocytes: 0.34 10*3/uL — ABNORMAL HIGH (ref 0.00–0.07)
Basophils Absolute: 0 10*3/uL (ref 0.0–0.1)
Basophils Relative: 0 %
Eosinophils Absolute: 0 10*3/uL (ref 0.0–0.5)
Eosinophils Relative: 0 %
HCT: 31.9 % — ABNORMAL LOW (ref 39.0–52.0)
Hemoglobin: 10.7 g/dL — ABNORMAL LOW (ref 13.0–17.0)
Immature Granulocytes: 3 %
Lymphocytes Relative: 6 %
Lymphs Abs: 0.6 10*3/uL — ABNORMAL LOW (ref 0.7–4.0)
MCH: 32.3 pg (ref 26.0–34.0)
MCHC: 33.5 g/dL (ref 30.0–36.0)
MCV: 96.4 fL (ref 80.0–100.0)
Monocytes Absolute: 0.9 10*3/uL (ref 0.1–1.0)
Monocytes Relative: 8 %
Neutro Abs: 9.2 10*3/uL — ABNORMAL HIGH (ref 1.7–7.7)
Neutrophils Relative %: 83 %
Platelets: 134 10*3/uL — ABNORMAL LOW (ref 150–400)
RBC: 3.31 MIL/uL — ABNORMAL LOW (ref 4.22–5.81)
RDW: 16.5 % — ABNORMAL HIGH (ref 11.5–15.5)
WBC: 11.1 10*3/uL — ABNORMAL HIGH (ref 4.0–10.5)
nRBC: 2 % — ABNORMAL HIGH (ref 0.0–0.2)

## 2020-06-29 LAB — HEPATIC FUNCTION PANEL
ALT: 15 U/L (ref 0–44)
AST: 44 U/L — ABNORMAL HIGH (ref 15–41)
Albumin: 2.7 g/dL — ABNORMAL LOW (ref 3.5–5.0)
Alkaline Phosphatase: 51 U/L (ref 38–126)
Bilirubin, Direct: 0.1 mg/dL (ref 0.0–0.2)
Indirect Bilirubin: 1.1 mg/dL — ABNORMAL HIGH (ref 0.3–0.9)
Total Bilirubin: 1.2 mg/dL (ref 0.3–1.2)
Total Protein: 6.1 g/dL — ABNORMAL LOW (ref 6.5–8.1)

## 2020-06-29 LAB — COMPREHENSIVE METABOLIC PANEL
ALT: 16 U/L (ref 0–44)
AST: 44 U/L — ABNORMAL HIGH (ref 15–41)
Albumin: 2.7 g/dL — ABNORMAL LOW (ref 3.5–5.0)
Alkaline Phosphatase: 49 U/L (ref 38–126)
Anion gap: 22 — ABNORMAL HIGH (ref 5–15)
BUN: 71 mg/dL — ABNORMAL HIGH (ref 8–23)
CO2: 18 mmol/L — ABNORMAL LOW (ref 22–32)
Calcium: 7.1 mg/dL — ABNORMAL LOW (ref 8.9–10.3)
Chloride: 95 mmol/L — ABNORMAL LOW (ref 98–111)
Creatinine, Ser: 7.49 mg/dL — ABNORMAL HIGH (ref 0.61–1.24)
GFR, Estimated: 7 mL/min — ABNORMAL LOW (ref >60)
Glucose, Bld: 123 mg/dL — ABNORMAL HIGH (ref 70–99)
Potassium: 4.4 mmol/L (ref 3.5–5.1)
Sodium: 135 mmol/L (ref 135–145)
Total Bilirubin: 1.2 mg/dL (ref 0.3–1.2)
Total Protein: 6 g/dL — ABNORMAL LOW (ref 6.5–8.1)

## 2020-06-29 LAB — CULTURE, BLOOD (ROUTINE X 2)
Culture: NO GROWTH
Culture: NO GROWTH

## 2020-06-29 LAB — LACTIC ACID, PLASMA
Lactic Acid, Venous: 2.7 mmol/L (ref 0.5–1.9)
Lactic Acid, Venous: 1.8 mmol/L (ref 0.5–1.9)

## 2020-06-29 LAB — PROTIME-INR
INR: 1.2 (ref 0.8–1.2)
Prothrombin Time: 14.3 seconds (ref 11.4–15.2)

## 2020-06-29 LAB — AMMONIA: Ammonia: 14 umol/L (ref 9–35)

## 2020-06-29 LAB — GLUCOSE, CAPILLARY
Glucose-Capillary: 109 mg/dL — ABNORMAL HIGH (ref 70–99)
Glucose-Capillary: 101 mg/dL — ABNORMAL HIGH (ref 70–99)
Glucose-Capillary: 122 mg/dL — ABNORMAL HIGH (ref 70–99)
Glucose-Capillary: 132 mg/dL — ABNORMAL HIGH (ref 70–99)

## 2020-06-29 LAB — MAGNESIUM: Magnesium: 2.4 mg/dL (ref 1.7–2.4)

## 2020-06-29 LAB — TSH: TSH: 7.22 u[IU]/mL — ABNORMAL HIGH (ref 0.350–4.500)

## 2020-06-29 LAB — APTT: aPTT: 41 seconds — ABNORMAL HIGH (ref 24–36)

## 2020-06-29 LAB — HEPATITIS B DNA, ULTRAQUANTITATIVE, PCR
HBV DNA SERPL PCR-ACNC: NOT DETECTED IU/mL
HBV DNA SERPL PCR-LOG IU: UNDETERMINED log10 IU/mL

## 2020-06-29 LAB — LIPASE, BLOOD: Lipase: 142 U/L — ABNORMAL HIGH (ref 11–51)

## 2020-06-29 LAB — PHOSPHORUS: Phosphorus: 9 mg/dL — ABNORMAL HIGH (ref 2.5–4.6)

## 2020-06-29 LAB — HEPATITIS B SURFACE ANTIGEN: Hepatitis B Surface Ag: NONREACTIVE

## 2020-06-29 MED ORDER — SODIUM CHLORIDE 0.9 % IV SOLN: Freq: Once | INTRAVENOUS | Status: AC

## 2020-06-29 MED ORDER — METHYLPREDNISOLONE SODIUM SUCC 40 MG IJ SOLR
16.0000 mg | Freq: Every day | INTRAMUSCULAR | Status: AC
  Administered 2020-06-29 – 2020-07-03 (×5): 16 mg via INTRAVENOUS
  Filled 2020-06-29 (×5): qty 1

## 2020-06-29 MED ORDER — ACETAMINOPHEN 10 MG/ML IV SOLN
1000.0000 mg | Freq: Four times a day (QID) | INTRAVENOUS | Status: AC
  Administered 2020-06-29 (×2): 1000 mg via INTRAVENOUS
  Filled 2020-06-29 (×2): qty 100

## 2020-06-29 MED ORDER — SODIUM CHLORIDE 0.9 % IV BOLUS (SEPSIS)
500.0000 mL | Freq: Once | INTRAVENOUS | Status: AC
  Administered 2020-06-29: 02:00:00 500 mL via INTRAVENOUS

## 2020-06-29 MED ORDER — PROMETHAZINE HCL 25 MG/ML IJ SOLN
6.2500 mg | Freq: Once | INTRAMUSCULAR | Status: AC
  Administered 2020-06-29: 6.25 mg via INTRAVENOUS
  Filled 2020-06-29: qty 1

## 2020-06-29 MED ORDER — VITAL HIGH PROTEIN PO LIQD: 1000.0000 mL | ORAL | Status: DC

## 2020-06-29 MED ORDER — SODIUM CHLORIDE 0.9 % IV SOLN: 500.0000 mg | INTRAVENOUS | Status: DC

## 2020-06-29 MED ORDER — MORPHINE SULFATE (PF) 2 MG/ML IV SOLN
2.0000 mg | INTRAVENOUS | Status: DC | PRN
  Administered 2020-07-02: 1 mg via INTRAVENOUS
  Filled 2020-06-29 (×2): qty 1

## 2020-06-29 MED ORDER — PROSOURCE TF PO LIQD
45.0000 mL | Freq: Three times a day (TID) | ORAL | Status: DC
Start: 2020-06-30 — End: 2020-07-03
  Administered 2020-06-30 – 2020-07-03 (×7): 45 mL
  Filled 2020-06-29 (×13): qty 45

## 2020-06-29 MED ORDER — CHLORHEXIDINE GLUCONATE CLOTH 2 % EX PADS
6.0000 | MEDICATED_PAD | Freq: Every day | CUTANEOUS | Status: DC
  Administered 2020-06-30 – 2020-07-04 (×5): 6 via TOPICAL

## 2020-06-29 MED ORDER — NEPRO/CARBSTEADY PO LIQD
1000.0000 mL | ORAL | Status: DC
  Administered 2020-06-30 – 2020-07-02 (×5): 1000 mL

## 2020-06-29 MED ORDER — PANTOPRAZOLE SODIUM 40 MG IV SOLR
40.0000 mg | Freq: Two times a day (BID) | INTRAVENOUS | Status: DC
  Administered 2020-06-29 – 2020-07-04 (×13): 40 mg via INTRAVENOUS
  Filled 2020-06-29 (×13): qty 40

## 2020-06-29 MED ORDER — ACETAMINOPHEN 10 MG/ML IV SOLN
1000.0000 mg | Freq: Once | INTRAVENOUS | Status: AC
  Administered 2020-06-29: 1000 mg via INTRAVENOUS
  Filled 2020-06-29: qty 100

## 2020-06-29 MED ORDER — PROSOURCE TF PO LIQD: 45.0000 mL | Freq: Two times a day (BID) | ORAL | Status: DC

## 2020-06-29 NOTE — Progress Notes (Signed)
Night coverage  Received call about read mews score on patient readmitted within 24 hours with pneumonia.  Code sepsis was initiated.  Sepsis, POA -Patient meeting sepsis criteria with fever, tachycardia, known pneumonia -Sepsis blood work returns [place]ing [place]BC [place] 528[address] and lactic acid 2.7 -IV fluids -Continue IV antibiotics -Continue other management per H&P dictated earlier

## 2020-06-29 NOTE — Progress Notes (Signed)
Attempt to place NG tube first attempt coiled at thoracic inlet. Second attempt unsuccessful with tubing meeting resistance and  Entering oral cavity.Scott Oconnor

## 2020-06-29 NOTE — TOC Initial Note (Signed)
Transition of Care [facility]) - Initial/Assessment Note    Patient Details  Name: Scott Oconnor MRN: [id number] Date of Birth: 07-Dec-1945  Transition of Care [facility]) CM/SW Contact:    Shelbie Hutching, RN Phone Number: 06/29/2020, 4:09 PM  Clinical Narrative:                 Patient admitted to the hospital with failure to thrive diagnosed with COVID on 1/27.  RNCM met with patient at the bedside and patient's daughter, Kenney Houseman.  Kenney Houseman reports that her father lives with her and before getting COVID was completely independent.   At this time Kenney Houseman, voices that she and the family want to have the patient treated for COVID and have a temporary feeding tube placed (NG) just while he is in the hospital.  She does not want patient to go to SNF.  Patient is already open with [facility], plan will be to return home with [facility].  RNCM explained that if patient needs any special equipment TOC team can order and have delivered.   TOC team will cont to follow and assist with discharge planning.    Expected Discharge Plan: [place] Barriers to Discharge: Continued Medical Work up   Patient Goals and CMS Choice Patient states their goals for this hospitalization and ongoing recovery are:: Daughter at the bedside and they agree to feeding tube in hospital and remdesivir then home with home health CMS Medicare.gov Compare Post Acute Care list provided to:: Patient Represenative (must comment) Choice offered to / list presented to : Adult Children  Expected Discharge Plan and Services Expected Discharge Plan: [place]   Discharge Planning Services: CM Consult Post Acute Care Choice: [place] arrangements for the past 2 months: Single Family Home                           HH Arranged: RN,PT,OT,Nurse's Aide [place] Agency: [facility] Date [place]: 06/29/20 Time [place] Choice: [id number] Representative spoke with at [place]:  [place] Arrangements/Services Living arrangements for the past 2 months: [place] with:: Adult Children Patient language and need for interpreter reviewed:: Yes Do you feel safe going back to the place where you live?: Yes      Need for Family Participation in Patient Care: Yes (Comment) (failure to thrive) Care giver support system in place?: Yes (comment) (great family support) Current home services: DME Criminal Activity/Legal Involvement Pertinent to Current Situation/Hospitalization: No - Comment as needed  Activities of Daily Living Home Assistive Devices/Equipment: None ADL Screening (condition at time of admission) Patient's cognitive ability adequate to safely complete daily activities?: No Is the patient deaf or have difficulty hearing?: No Does the patient have difficulty seeing, even when wearing glasses/contacts?: Yes Does the patient have difficulty concentrating, remembering, or making decisions?: Yes Patient able to express need for assistance with ADLs?: No Does the patient have difficulty dressing or bathing?: Yes Independently performs ADLs?: No Communication: Dependent Is this a change from baseline?: Change from baseline, expected to last <3 days Dressing (OT): Dependent Grooming: Dependent Feeding: Dependent Bathing: Dependent Toileting: Dependent In/Out Bed: Dependent Walks in Home: Dependent Does the patient have difficulty walking or climbing stairs?: Yes Weakness of Legs: Both Weakness of Arms/Hands: Both  Permission Sought/Granted Permission sought to share information with : Case Manager,Family Supports,Other (comment) Permission granted to share information with : Yes, Verbal Permission Granted  Share Information with NAME: Kenney Houseman  Permission granted to share info w AGENCY: [facility]  Permission granted to share info w Relationship: daughter     Emotional Assessment Appearance:: Appears stated  age Attitude/Demeanor/Rapport: Lethargic   Orientation: : Oriented to Self Alcohol / Substance Use: Not Applicable Psych Involvement: No (comment)  Admission diagnosis:  Failure to thrive in adult [R62.7] Pneumonia due to infectious organism, unspecified laterality, unspecified part of lung [J18.9] COVID-19 [U07.1] Patient Active Problem List   Diagnosis Date Noted  . Failure to thrive in adult 07/10/2020  . Sepsis with encephalopathy without septic shock ([place])   . Lobar pneumonia ([place])   . Acute blood loss anemia   . COVID-19   . Toxic metabolic encephalopathy   . Anemia of chronic kidney failure, stage 5 ([place]) 05/06/2020  . Acute on chronic systolic CHF (congestive heart failure) ([place]) 05/06/2020  . Hypertensive emergency 05/06/2020  . Elevated troponin 05/06/2020  . Acute CHF (congestive heart failure) ([place]) 05/06/2020  . Acute respiratory failure with hypoxia ([place]) 05/06/2020  . Chronic anticoagulation 05/06/2020  . End stage renal disease on dialysis ([place]) 04/11/2020  . Altered mental status 04/10/2020  . HFrEF (heart failure with reduced ejection fraction) ([place])   . Acute pulmonary edema ([place]) 11/18/2019  . Protein-calorie malnutrition, severe 09/27/2019  . Acute kidney failure, unspecified ([place])   . History of AAA (abdominal aortic aneurysm) repair   . Anemia   . Thrombocytopenia ([place])   . Acute kidney injury superimposed on CKD ([place])   . Essential hypertension   . Hyperlipidemia   . Fatigue 09/19/2019  . AAA (abdominal aortic aneurysm) ([place]) 08/14/2019  . AAA (abdominal aortic aneurysm) without rupture ([place]) 08/01/2019  . PVD (peripheral vascular disease) ([place]) 07/24/2019  . Tobacco abuse 07/24/2019  . Raynaud's disease without gangrene 06/21/2019  . Mixed dyslipidemia 06/15/2018  . Family history of heart attack 06/15/2018  . Smokers' cough ([place]) 06/16/2017   PCP:  [name], MD Pharmacy:   [facility] [phone] [address], [place] [address] [place] [address] Phone: [phone] Fax: [address], [place] [address] Phone: [phone] Fax: [phone]     Social Determinants of Health (SDOH) Interventions    Readmission Risk Interventions Readmission Risk Prevention Plan 06/29/2020 06/24/2020  Transportation Screening Complete Complete  Medication Review ([place]) Complete Complete  PCP or Specialist appointment within 3-5 days of discharge Complete Complete  HRI or Home Care Consult Complete Complete  SW Recovery Care/Counseling Consult Complete Complete  Palliative Care Screening Not Applicable Not Applicable  Skilled Nursing Facility Complete Complete  Some recent data might be hidden

## 2020-06-29 NOTE — Progress Notes (Addendum)
Initial Nutrition Assessment  DOCUMENTATION CODES:   Underweight  INTERVENTION:   -Continue renal MVI daily  -Once NGT is placed, recommend:   Initiate Nepro @ 25 ml/hr via NGT and increase by 10 ml every 12 hours to goal rate of 45 ml/hr.   45 ml Prosource TF TID  Tube feeding regimen provides 2064 kcal (100% of needs), 120 grams of protein, and 785 ml of H2O.   -Once feedings are initiated, monitor K, Mg, and Phos daily and replete as needed. Pt is at high refeeding risk  NUTRITION DIAGNOSIS:   Increased nutrient needs related to chronic illness (ESRD on HD) as evidenced by estimated needs.  GOAL:   Patient will meet greater than or equal to 90% of their needs  MONITOR:   PO intake,Supplement acceptance,Labs,Weight trends,I & O's  REASON FOR ASSESSMENT:   Consult Assessment of nutrition requirement/status  ASSESSMENT:   Scott Oconnor is a 75 y.o. male with medical history significant of ESRD on dialysis- was doing PD at home, now doing HD, anemia of CKD, HTN, CHF, PVD   raynaud's syndrome, hyperlipidemia  was admitted on 06/18/2020 for acute gi bleed, and COVID-19 pneumonia. Course further complicated by bacterial pna, sepsis,uremia and metabolic encephalopathy Patient was treated with solumedrol with plans for continued taper at home for his diagnosis of COVID-19,he was not given remdesivir due to family preference. He was treated with zosyn with plans for completion of course with Augmentin at home for his diagnosis of lobar pna. In reference to his GI bleed he was evaluated by gi who recommended conservative management, he wasgiven 3 unit of PRBC during his hospitalization.  Of note medical team recommended further inpatient stay with transition to rehab due to patient significant weakness, and poor po intake, however family and patient opted for discharge home. Patient was discharged on 06/27/2020. Patient now returns to ED one day later due to family noting continued  weakness and continued poor po intake since discharge. Per daughter home health nurse recommended return to ED for evaluation base on patient status at home.Of note patient also was seen by palliative care as inpatient and patient remains a full code with full scope of care.Per daughter patient complaints of diffuse body aches, as well as nausea. Patient also states he has abdominal pain. Per daughter patient has had 2 bm since being home both w/o black stools, or brb. He notes no sob. But continues per daughter to be very weak and lethargic.  Pt admitted with failure to thrive/ acute on chronic debility related to medical illness.   Pt unavailable at time of visit.   Per chart review, pt is very lethargic and with minimal PO intake. Pt daughter is requesting NGT for nutrition.   Reached out to Dr. Darrick Meigs to clarify nutrition plan. Plan to place NGT; received permission to place TF orders. Pt is at high risk for re-feeding syndrome.   Reviewed wt hx; wt has been stable over the past 9 months.   Due to underweight status and multiple co-morbidities, highly suspect malnutrition, however, unable to identify at this time.   Labs reviewed: CBGS: 101-122.   Diet Order:   Diet Order            Diet renal with fluid restriction Fluid restriction: 1200 mL Fluid; Room service appropriate? Yes; Fluid consistency: Thin  Diet effective now                 EDUCATION NEEDS:   No education needs have  been identified at this time  Skin:  Skin Assessment: Reviewed RN Assessment  Last BM:  06/27/20  Height:   Ht Readings from Last 1 Encounters:  06/30/2020 '5\' 10"'$  (1.778 m)    Weight:   Wt Readings from Last 1 Encounters:  06/29/20 55 kg    Ideal Body Weight:  75.5 kg  BMI:  Body mass index is 17.4 kg/m.  Estimated Nutritional Needs:   Kcal:  2000-2200  Protein:  120-135 grams  Fluid:  1000 +_ UOP    Loistine Chance, RD, LDN, [place] Registered Dietitian II Certified Diabetes Care and  Education Specialist Please refer to [facility] for RD and/or RD on-call/weekend/after hours pager

## 2020-06-29 NOTE — Progress Notes (Signed)
[place], [place] 06/27/20  Subjective:   [name] was admitted to [facility] on 07/02/2020 for weakness and poor appetite. Per daughter, the he also complains of body aches and nausea.  Covid -19 +  Patient resting in bed, daughter at bedside.   He appears lethargic and does not respond to name.  Resting on room air Poor appetite, concern voiced by daughter Daughter requests NG feeding.    Objective:  Vital signs in last 24 hours:  Temp:  [97.6 F (36.4 C)-99.1 F (37.3 C)] 97.8 F (36.6 C) (02/05 1245) Pulse Rate:  [54-79] 79 (02/05 1245) Resp:  [12-26] 22 (02/05 1245) BP: (101-168)/(66-87) 157/76 (02/05 1245) SpO2:  [91 %-100 %] 91 % (02/05 1245)  Weight change:  Filed Weights   06/20/20 0519 06/22/20 0458 06/26/20 0500  Weight: 55.1 kg 55.2 kg 54.9 kg    Intake/Output:    Intake/Output Summary (Last 24 hours) at 06/27/2020 1356 Last data filed at 06/27/2020 1300 Gross per 24 hour  Intake 348 ml  Output 0 ml  Net 348 ml     Physical Exam: General:  Resting in bed, lethargic, chronically ill-appearing  HEENT  Eyes closed, normocephalic, atraumatic  Pulm/lungs Clear bilaterally, respirations even and unlabored  CVS/Heart  S1-S2 present, no rubs or gallops  Abdomen:   soft, nondistended, nontender  Extremities:  Trace peripheral edema  Neurologic:  Unable to answer questions  Skin:  No lesions or rashes  Access:  PD cath ,Left IJ Permcath       Basic Metabolic Panel:  Recent Labs  Lab 06/21/20 0416 06/22/20 0510 06/23/20 0559 06/24/20 [id number] 06/24/20 2159 06/25/20 0759 06/26/20 0536 06/27/20 1051  NA 139   < > 143 140 136 137 134*  --   K 4.8   < > 4.5 4.3 3.8 4.3 4.9  --   CL 98   < > 97* 98 95* 95* 93*  --   CO2 19*   < > 22 20* 21* 22 23  --   GLUCOSE 116*   < > 132* 118* 87 83 168*  --   BUN 128*   < > 116* 109* 51* 60* 48*  --   CREATININE 15.49*   < > 15.88* 16.30* 8.46* 9.52* 6.39*  --   CALCIUM 7.6*   < >  7.7* 7.1* 7.6* 7.6* 8.1*  --   PHOS 9.5*  --  9.1* 10.0*  --   --   --  6.4*   < > = values in this interval not displayed.     CBC: Recent Labs  Lab 06/24/20 0619 06/24/20 2159 06/25/20 0759 06/26/20 0536 06/27/20 0618  WBC 9.2 9.9 9.8 5.5 7.8  NEUTROABS  --  8.7*  --   --   --   HGB 6.9* 8.0* 8.1* 7.7* 7.2*  HCT 20.6* 23.8* 24.0* 23.1* 21.6*  MCV 97.6 99.2 98.0 97.9 97.3  PLT 98* 100* 112* 125* 133*      Lab Results  Component Value Date   HEPBSAG NON REACTIVE 05/06/2020   HEPBSAB NON REACTIVE 09/20/2019   HEPBIGM NON REACTIVE 09/20/2019      Microbiology:  Recent Results (from the past 240 hour(s))  Culture, blood (x 2)     Status: None (Preliminary result)   Collection Time: 06/24/20 10:00 PM   Specimen: BLOOD  Result Value Ref Range Status   Specimen Description BLOOD BLOOD LEFT HAND  Final   Special Requests   Final  BOTTLES DRAWN AEROBIC ONLY Blood Culture results may not be optimal due to an inadequate volume of blood received in culture bottles   Culture   Final    NO GROWTH 3 DAYS Performed at [facility], [address] [place], [place] [address]    Report Status PENDING  Incomplete  Culture, blood (x 2)     Status: None (Preliminary result)   Collection Time: 06/24/20 10:01 PM   Specimen: BLOOD  Result Value Ref Range Status   Specimen Description BLOOD BLOOD RIGHT HAND  Final   Special Requests   Final    BOTTLES DRAWN AEROBIC ONLY Blood Culture results may not be optimal due to an inadequate volume of blood received in culture bottles   Culture   Final    NO GROWTH 3 DAYS Performed at [facility], [address], [place], [place] [address]    Report Status PENDING  Incomplete  MRSA PCR Screening     Status: None   Collection Time: 06/25/20  6:27 PM   Specimen: Nasopharyngeal  Result Value Ref Range Status   MRSA by PCR NEGATIVE NEGATIVE Final    Comment:        The GeneXpert MRSA Assay (FDA approved for NASAL  specimens only), is one component of a comprehensive MRSA colonization surveillance program. It is not intended to diagnose MRSA infection nor to guide or monitor treatment for MRSA infections. Performed at [facility], [address] [place], [place] [address]     Coagulation Studies: Recent Labs    06/24/20 2157/08/15  LABPROT 14.2  INR 1.1    Urinalysis: No results for input(s): COLORURINE, LABSPEC, PHURINE, GLUCOSEU, HGBUR, BILIRUBINUR, KETONESUR, PROTEINUR, UROBILINOGEN, NITRITE, LEUKOCYTESUR in the last 72 hours.  Invalid input(s): APPERANCEUR    Imaging: No results found.   Medications:   . sodium chloride 50 mL/hr at 06/26/20 0030  . acetaminophen 1,000 mg (06/27/20 0600)  . dialysis solution 1.5% low-MG/low-CA    . piperacillin-tazobactam (ZOSYN)  IV 2.25 g (06/27/20 0858)   . sodium chloride   Intravenous Once  . sodium chloride   Intravenous Once  . atorvastatin  40 mg Oral Daily  . benzonatate  100 mg Oral Q8H  . carvedilol  3.125 mg Oral BID WC  . Chlorhexidine Gluconate Cloth  6 each Topical Daily  . epoetin (EPOGEN/PROCRIT) injection  20,000 Units Subcutaneous Weekly  . gentamicin cream  1 application Topical Daily  . guaiFENesin  600 mg Oral BID  . methylPREDNISolone (SOLU-MEDROL) injection  40 mg Intravenous Daily  . multivitamin  1 tablet Oral QHS  . pantoprazole  40 mg Intravenous Q12H  . polyethylene glycol  17 g Oral Daily   acetaminophen **OR** acetaminophen **OR** acetaminophen, calcium carbonate, guaiFENesin-dextromethorphan, lidocaine, loperamide, ondansetron (ZOFRAN) IV, ondansetron (ZOFRAN) IV  Assessment/ Plan:  75 y.o. male with  end stage renal disease on peritoneal dialysis, hypertension, AAA repair, congestive heart failure, peripheral vascular disease, raynaud's syndrome, hyperlipidemia  was admitted on 06/18/2020 for  Active Problems:   Essential hypertension   HFrEF (heart failure with reduced ejection fraction) (HCC)    End stage renal disease on dialysis (HCC)   Acute blood loss anemia   [id number]   Toxic metabolic encephalopathy   Lobar pneumonia (HCC)   Sepsis with encephalopathy without septic shock (HCC)  Acute GI bleeding [K92.2] Generalized weakness [R53.1] Gastrointestinal hemorrhage, unspecified gastrointestinal hemorrhage type [K92.2] COVID-19 [U07.1]   [facility] 52.5kg CCPD 9 hours 5 exchanges 209m fills  #.  ESRD Patient will receive hemodialysis while in hospital.  Next treatment is scheduled for tomorrow. Will transition to PD at discharge  #. Anemia of CKD HGB is 10.7 on admission Epogen 20,000 units weekly  #. Secondary hyperparathyroidism of renal origin N 25.81   No results found for: PTH Will monitor phosphorus and calcium  We will continue bone mineral metabolism parameters  #Covid -19 positive pneumonia Tested positive on June 18, 2020 No acute respiratory distress noted, patient is on room air Management per [facility] teams

## 2020-06-29 NOTE — Plan of Care (Signed)
  Problem: Education: Goal: Knowledge of risk factors and measures for prevention of condition will improve Outcome: Progressing   

## 2020-06-29 NOTE — Progress Notes (Signed)
[place] Room [facility]) Hospital Liaison RN note:  Received new referral for [facility] out patient palliative program to follow post discharge from [place], TOC. Palliative referral is aware and has patient information. Doran Clay, TOC is aware. [place] Liaison will follow for disposition.  Thank you for this referral.  Zandra Abts, RN [facility] Liaison [phone]

## 2020-06-29 NOTE — Progress Notes (Addendum)
[facility]  PROGRESS NOTE  Scott Oconnor [id number] DOB: 06-03-1945 DOA: 07/14/2020 PCP: [name], MD   Brief HPI:   75 year old male with medical history of ESRD on hemodialysis, who was started on peritoneal dialysis at home recently after being switched from hemodialysis, anemia of chronic kidney disease, hypertension, CHF, peripheral vascular disease, hyperlipidemia, Raynaud's syndrome who was admitted on 06/18/2020 for acute GI bleed and Covid pneumonia.  Hospital course was complicated by bacterial pneumonia, sepsis, uremia and metabolic encephalopathy.  He was treated with Solu-Medrol with plans for continued taper at home for COVID-19 pneumonia.  He was not given remdesivir due to family preference.  He was treated with Zosyn with plan to complete course with Augmentin at home for recent diagnosis of lumbar pneumonia.  GI has seen the patient and recommended conservative management.  He was given 3 units PRBC during his hospitalization.  Patient was discharged home and return to ED due to continued weakness, poor p.o. intake since discharge, fever.  As per daughter was noted to have temperature 100.4 at home.  Patient was admitted with sepsis due to fever, tachycardia,  pneumonia with lactic acid 2.7.  He was started on IV Zosyn.    Subjective   Patient seen and examined, very lethargic, barely opens eyes to verbal stimuli.  Denies any pain.     Assessment/Plan:     1. Severe sepsis due to pneumonia-patient was diagnosed with COVID-19 pneumonia, remdesivir was not given as per family decision.  Patient was discharged on steroid taper.  Also was started on Augmentin for lobar pneumonia.  He has been started on IV Zosyn for total 7 days.  Also on Solu-Medrol 60 mg IV daily.  CRP is 16.4. 2. Recent GI bleed-patient had recent GI bleed, he was on Eliquis which has been discontinued.  GI saw the patient and did not recommend any intervention at that time.  Hemoglobin  remained stable at 10.7.  No further evidence of bleeding.  We will continue to monitor hemoglobin/hematocrit. 3. Metabolic encephalopathy-likely in setting of COVID-19 pneumonia, also had uremia during previous admission.  Ammonia level is 14.CT head showed no acute changes. 4. ESRD-patient was on PD at home, nephrology has been consulted and plan for hemodialysis while in the hospital. 5. Failure to thrive/poor p.o. intake-patient has been declining medically since 20th January, as per daughter patient has very poor p.o. intake.  Discussed with patient's daughter and her cousin from [place] who is a physician.  Family wants to try NG tube feeding for few days and see if that helps patient mental status, and poor appetite.  Will insert NG tube for feeding. 6.      COVID-19 Labs  Recent Labs    06/27/20 0618  CRP 16.4*    Lab Results  Component Value Date   SARSCOV2NAA NEGATIVE 05/06/2020   SARSCOV2NAA NEGATIVE 04/10/2020   [place] NEGATIVE 11/18/2019   [place] NEGATIVE 09/20/2019     Scheduled medications:   . benzonatate  100 mg Oral Q8H  . carvedilol  3.125 mg Oral BID WC  . methylPREDNISolone (SOLU-MEDROL) injection  16 mg Intravenous Daily  . multivitamin  1 tablet Oral QHS  . pantoprazole (PROTONIX) IV  40 mg Intravenous Q12H  . sodium chloride flush  3 mL Intravenous Q12H         CBG: Recent Labs  Lab 06/23/20 0636 06/29/20 0827 06/29/20 1137  GLUCAP 111* 109* 101*    SpO2: 100 %    CBC: Recent Labs  Lab 06/24/20 2159 06/25/20 0759 06/26/20 0536 06/27/20 0618 07/01/2020 1725 06/29/20 0213  WBC 9.9 9.8 5.5 7.8 10.8* 11.1*  NEUTROABS 8.7*  --   --   --  9.1* 9.2*  HGB 8.0* 8.1* 7.7* 7.2* 10.7* 10.7*  HCT 23.8* 24.0* 23.1* 21.6* 31.4* 31.9*  MCV 99.2 98.0 97.9 97.3 95.7 96.4  PLT 100* 112* 125* 133* 128* 134*    Basic Metabolic Panel: Recent Labs  Lab 06/23/20 0559 06/24/20 0619 06/24/20 2159 06/25/20 0759 06/26/20 0536  06/27/20 1051 07/17/2020 1920 06/29/20 0213  NA 143 140 136 137 134*  --  136 135  K 4.5 4.3 3.8 4.3 4.9  --  5.2* 4.4  CL 97* 98 95* 95* 93*  --  95* 95*  CO2 22 20* 21* 22 23  --  24 18*  GLUCOSE 132* 118* 87 83 168*  --  100* 123*  BUN 116* 109* 51* 60* 48*  --  64* 71*  CREATININE 15.88* 16.30* 8.46* 9.52* 6.39*  --  6.84* 7.49*  CALCIUM 7.7* 7.1* 7.6* 7.6* 8.1*  --  7.0* 7.1*  PHOS 9.1* 10.0*  --   --   --  6.4*  --   --      Liver Function Tests: Recent Labs  Lab 06/23/20 0559 06/24/20 0619 06/24/20 2159 06/29/20 0213  AST  --   --  62* 44*  44*  ALT  --   --  '10 16  15  '$ ALKPHOS  --   --  44 49  51  BILITOT  --   --  1.1 1.2  1.2  PROT  --   --  5.9* 6.0*  6.1*  ALBUMIN 2.9* 2.5* 2.6* 2.7*  2.7*     Antibiotics: Anti-infectives (From admission, onward)   Start     Dose/Rate Route Frequency Ordered Stop   06/29/20 1000  remdesivir 100 mg in sodium chloride 0.9 % 100 mL IVPB       "Followed by" Linked Group Details   100 mg 200 mL/hr over 30 Minutes Intravenous Daily 07/09/2020 2338 07/03/20 0959   06/29/20 0300  piperacillin-tazobactam (ZOSYN) IVPB 2.25 g        2.25 g 100 mL/hr over 30 Minutes Intravenous Every 8 hours 07/03/2020 2343     06/29/20 0230  azithromycin (ZITHROMAX) 500 mg in sodium chloride 0.9 % 250 mL IVPB  Status:  Discontinued        500 mg 250 mL/hr over 60 Minutes Intravenous Every 24 hours 06/29/20 0130 06/29/20 0130   06/29/2020 2345  remdesivir 200 mg in sodium chloride 0.9% 250 mL IVPB       "Followed by" Linked Group Details   200 mg 580 mL/hr over 30 Minutes Intravenous Once 07/15/2020 2338 06/29/20 0131   07/12/2020 1815  piperacillin-tazobactam (ZOSYN) IVPB 3.375 g        3.375 g 100 mL/hr over 30 Minutes Intravenous  Once 07/01/2020 1809 07/01/2020 1921       DVT prophylaxis: SCDs  Code Status: Full code  Family Communication: Discussed with patient's daughter and her cousin from [place] on  phone.   Consultants:  Nephrology  Procedures:      Objective   Vitals:   06/29/20 0500 06/29/20 0559 06/29/20 0826 06/29/20 1135  BP:  (!) 165/101 (!) 143/81 (!) 155/90  Pulse:  93 91 84  Resp:  '16 16 19  '$ Temp:  99.8 F (37.7 C) 98.2 F (36.8 C) 97.8 F (36.6 C)  TempSrc:  Axillary Axillary Oral  SpO2:  100% 98% 100%  Weight: 55 kg     Height:       No intake or output data in the 24 hours ending 06/29/20 1401  No intake/output data recorded.  Filed Weights   07/06/2020 1636 06/29/20 0500  Weight: 55 kg 55 kg    Physical Examination:   General-appears lethargic Heart-S1-S2, regular, no murmur auscultated Lungs-clear to auscultation bilaterally, no wheezing or crackles auscultated Abdomen-soft, nontender, no organomegaly Extremities-no edema in the lower extremities Neuro-somnolent, barely opens eyes to verbal stimuli, follows commands  Status is: Inpatient  Dispo: The patient is from: Home              Anticipated d/c is to: Home versus skilled nursing facility              Anticipated d/c date is: 07/02/2020              Patient currently not stable for discharge  Barrier to discharge-ongoing treatment for sepsis, poor p.o. intake       Data Reviewed:   Recent Results (from the past 240 hour(s))  Culture, blood (x 2)     Status: None   Collection Time: 06/24/20 10:00 PM   Specimen: BLOOD  Result Value Ref Range Status   Specimen Description BLOOD BLOOD LEFT HAND  Final   Special Requests   Final    BOTTLES DRAWN AEROBIC ONLY Blood Culture results may not be optimal due to an inadequate volume of blood received in culture bottles   Culture   Final    NO GROWTH 5 DAYS Performed at [facility], [address] [place], [place] [address]    Report Status 06/29/2020 FINAL  Final  Culture, blood (x 2)     Status: None   Collection Time: 06/24/20 10:01 PM   Specimen: BLOOD  Result Value Ref Range Status   Specimen Description BLOOD  BLOOD RIGHT HAND  Final   Special Requests   Final    BOTTLES DRAWN AEROBIC ONLY Blood Culture results may not be optimal due to an inadequate volume of blood received in culture bottles   Culture   Final    NO GROWTH 5 DAYS Performed at [facility], [address] [place], [place] [address]    Report Status 06/29/2020 FINAL  Final  MRSA PCR Screening     Status: None   Collection Time: 06/25/20  6:27 PM   Specimen: Nasopharyngeal  Result Value Ref Range Status   MRSA by PCR NEGATIVE NEGATIVE Final    Comment:        The GeneXpert MRSA Assay (FDA approved for NASAL specimens only), is one component of a comprehensive MRSA colonization surveillance program. It is not intended to diagnose MRSA infection nor to guide or monitor treatment for MRSA infections. Performed at [facility], [address] [place], [place] [address]   Blood culture (routine x 2)     Status: None (Preliminary result)   Collection Time: 06/30/2020  5:26 PM   Specimen: BLOOD  Result Value Ref Range Status   Specimen Description BLOOD RIGHT ANTECUBITAL  Final   Special Requests   Final    BOTTLES DRAWN AEROBIC AND ANAEROBIC Blood Culture results may not be optimal due to an inadequate volume of blood received in culture bottles   Culture   Final    NO GROWTH < 24 HOURS Performed at [facility], [address] [place], [place]  [address]    Report Status PENDING  Incomplete  Blood culture (routine x 2)     Status: None (Preliminary result)   Collection Time: 06/27/2020  5:26 PM   Specimen: BLOOD  Result Value Ref Range Status   Specimen Description BLOOD LEFT ANTECUBITAL  Final   Special Requests   Final    BOTTLES DRAWN AEROBIC AND ANAEROBIC Blood Culture results may not be optimal due to an inadequate volume of blood received in culture bottles   Culture   Final    NO GROWTH < 12 HOURS Performed at [facility], [address], [place], [place] [address]     Report Status PENDING  Incomplete    Recent Labs  Lab 06/29/20 0213  LIPASE 142*   Recent Labs  Lab 06/29/20 0517  AMMONIA 14    Cardiac Enzymes: Recent Labs  Lab 06/26/2020 1725  CKTOTAL 341   BNP (last 3 results) Recent Labs    09/29/19 0642 11/18/19 2118 05/06/20 0214  BNP 1,195.0* >4,500.0* >4,500.0*    ProBNP (last 3 results) No results for input(s): PROBNP in the last 8760 hours.  Studies:  CT HEAD WO CONTRAST  Result Date: 06/29/2020 CLINICAL DATA:  Altered mental status.  CNS infection suspected. EXAM: CT HEAD WITHOUT CONTRAST TECHNIQUE: Contiguous axial images were obtained from the base of the skull through the vertex without intravenous contrast. COMPARISON:  06/23/2020 FINDINGS: Brain: The study suffers from motion degradation. Age related atrophy. Chronic small-vessel ischemic changes of the hemispheric white matter. 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 No sign of acute infarction, mass lesion, hemorrhage, hydrocephalus or extra-axial collection. Vascular: There is atherosclerotic calcification of the major vessels at the base of the brain. Skull: Negative Sinuses/Orbits: Clear except for mild mucosal disease in the ethmoid sinuses right more than left. Orbits negative. Other: None IMPRESSION: Motion degraded study. No acute finding by CT. Chronic small-vessel ischemic changes of the white matter. 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 Electronically Signed   By: Nelson Chimes M.D.   On: 06/29/2020 00:33   DG Chest Portable 1 View  Result Date: 07/10/2020 CLINICAL DATA:  Weakness, failure to thrive EXAM: PORTABLE CHEST 1 VIEW COMPARISON:  Chest radiograph June 25, 2020 FINDINGS: Unchanged position of the left approach central venous catheter. Similar cardiomegaly. Similar diffuse bilateral interstitial prominence consistent with interstitial edema, infection or pneumonitis. Decreased opacification base. Decreased small left pleural effusion. The visualized  skeletal structures are unchanged. Pneumoperitoneum again visualized. IMPRESSION: 1. Similar diffuse bilateral interstitial prominence consistent with interstitial edema, infection or pneumonitis. Decreased small left pleural effusion and left basilar opacification, likely atelectasis. 2. Pneumoperitoneum again visualized. Electronically Signed   By: Dahlia Bailiff MD   On: 07/01/2020 17:29       Oswald Hillock   [facility] If 7PM-7AM, please contact night-coverage at [email or web address], Office  [phone]   06/29/2020, 2:01 PM  LOS: 1 day

## 2020-06-30 ENCOUNTER — Inpatient Hospital Stay: Payer: Medicare Other

## 2020-06-30 DIAGNOSIS — Z0189 Encounter for other specified special examinations: Secondary | ICD-10-CM

## 2020-06-30 DIAGNOSIS — R630 Anorexia: Secondary | ICD-10-CM | POA: Diagnosis not present

## 2020-06-30 DIAGNOSIS — R627 Adult failure to thrive: Secondary | ICD-10-CM | POA: Diagnosis not present

## 2020-06-30 DIAGNOSIS — U071 COVID-19: Secondary | ICD-10-CM | POA: Diagnosis not present

## 2020-06-30 LAB — GLUCOSE, CAPILLARY
Glucose-Capillary: 106 mg/dL — ABNORMAL HIGH (ref 70–99)
Glucose-Capillary: 119 mg/dL — ABNORMAL HIGH (ref 70–99)
Glucose-Capillary: 130 mg/dL — ABNORMAL HIGH (ref 70–99)
Glucose-Capillary: 137 mg/dL — ABNORMAL HIGH (ref 70–99)
Glucose-Capillary: 162 mg/dL — ABNORMAL HIGH (ref 70–99)
Glucose-Capillary: 91 mg/dL (ref 70–99)

## 2020-06-30 LAB — CBC
HCT: 31.8 % — ABNORMAL LOW (ref 39.0–52.0)
Hemoglobin: 10.6 g/dL — ABNORMAL LOW (ref 13.0–17.0)
MCH: 32.2 pg (ref 26.0–34.0)
MCHC: 33.3 g/dL (ref 30.0–36.0)
MCV: 96.7 fL (ref 80.0–100.0)
Platelets: 112 10*3/uL — ABNORMAL LOW (ref 150–400)
RBC: 3.29 MIL/uL — ABNORMAL LOW (ref 4.22–5.81)
RDW: 16.8 % — ABNORMAL HIGH (ref 11.5–15.5)
WBC: 6.5 10*3/uL (ref 4.0–10.5)
nRBC: 1.5 % — ABNORMAL HIGH (ref 0.0–0.2)

## 2020-06-30 LAB — C DIFFICILE QUICK SCREEN W PCR REFLEX
C Diff antigen: NEGATIVE
C Diff interpretation: NOT DETECTED
C Diff toxin: NEGATIVE

## 2020-06-30 LAB — COMPREHENSIVE METABOLIC PANEL
ALT: 14 U/L (ref 0–44)
AST: 33 U/L (ref 15–41)
Albumin: 2.3 g/dL — ABNORMAL LOW (ref 3.5–5.0)
Alkaline Phosphatase: 42 U/L (ref 38–126)
Anion gap: 23 — ABNORMAL HIGH (ref 5–15)
BUN: 97 mg/dL — ABNORMAL HIGH (ref 8–23)
CO2: 18 mmol/L — ABNORMAL LOW (ref 22–32)
Calcium: 7.7 mg/dL — ABNORMAL LOW (ref 8.9–10.3)
Chloride: 97 mmol/L — ABNORMAL LOW (ref 98–111)
Creatinine, Ser: 9.26 mg/dL — ABNORMAL HIGH (ref 0.61–1.24)
GFR, Estimated: 5 mL/min — ABNORMAL LOW (ref >60)
Glucose, Bld: 135 mg/dL — ABNORMAL HIGH (ref 70–99)
Potassium: 4.8 mmol/L (ref 3.5–5.1)
Sodium: 138 mmol/L (ref 135–145)
Total Bilirubin: 1.1 mg/dL (ref 0.3–1.2)
Total Protein: 5.3 g/dL — ABNORMAL LOW (ref 6.5–8.1)

## 2020-06-30 LAB — MAGNESIUM
Magnesium: 2.5 mg/dL — ABNORMAL HIGH (ref 1.7–2.4)
Magnesium: 2.2 mg/dL (ref 1.7–2.4)

## 2020-06-30 LAB — PHOSPHORUS
Phosphorus: 12.3 mg/dL — ABNORMAL HIGH (ref 2.5–4.6)
Phosphorus: 12.7 mg/dL — ABNORMAL HIGH (ref 2.5–4.6)
Phosphorus: 6.9 mg/dL — ABNORMAL HIGH (ref 2.5–4.6)

## 2020-06-30 MED ORDER — ACETAMINOPHEN 160 MG/5ML PO SOLN: 650.0000 mg | Freq: Four times a day (QID) | ORAL | Status: DC | PRN

## 2020-06-30 MED ORDER — LORAZEPAM 2 MG/ML IJ SOLN
1.0000 mg | Freq: Once | INTRAMUSCULAR | Status: DC
  Filled 2020-06-30: qty 1

## 2020-06-30 MED ORDER — ACETAMINOPHEN 10 MG/ML IV SOLN
1000.0000 mg | Freq: Once | INTRAVENOUS | Status: AC
  Administered 2020-06-30: 18:00:00 1000 mg via INTRAVENOUS
  Filled 2020-06-30: qty 100

## 2020-06-30 MED ORDER — ALBUMIN HUMAN 25 % IV SOLN
25.0000 g | Freq: Once | INTRAVENOUS | Status: DC
  Filled 2020-06-30: qty 100

## 2020-06-30 MED ORDER — CARVEDILOL 3.125 MG PO TABS
3.1250 mg | ORAL_TABLET | Freq: Two times a day (BID) | ORAL | Status: DC
  Administered 2020-07-01: 3.125 mg
  Filled 2020-06-30 (×2): qty 1

## 2020-06-30 MED ORDER — ACETAMINOPHEN 160 MG/5ML PO SOLN
1000.0000 mg | Freq: Four times a day (QID) | ORAL | Status: DC | PRN
  Administered 2020-07-01 – 2020-07-03 (×9): 1000 mg
  Filled 2020-06-30 (×12): qty 40.6

## 2020-06-30 MED ORDER — LOPERAMIDE HCL 1 MG/7.5ML PO SUSP
2.0000 mg | ORAL | Status: DC | PRN
  Filled 2020-06-30 (×3): qty 15

## 2020-06-30 NOTE — Progress Notes (Signed)
Assessment done at 2258

## 2020-06-30 NOTE — Progress Notes (Signed)
°   06/29/20 0049  Assess: MEWS Score  Temp (!) 101.7 F (38.7 C)  BP (!) 181/115  SpO2 97 %  Assess: MEWS Score  MEWS Temp 2  MEWS Systolic 0  MEWS Pulse 1  MEWS RR 1  MEWS LOC 0  MEWS Score 4  MEWS Score Color Red  Assess: if the MEWS score is Yellow or Red  Were vital signs taken at a resting state? Yes  Focused Assessment No change from prior assessment  Early Detection of Sepsis Score *See Row Information* High  MEWS guidelines implemented *See Row Information* Yes  Treat  MEWS Interventions Administered prn meds/treatments;Other (Comment) (Page MD, new orders given, & medication administered once received from pharmacy)  Pain Scale 0-10  Pain Score 0  Breathing 0  Negative Vocalization 0  Facial Expression 0  Body Language 0  Consolability 0  PAINAD Score 0  Take Vital Signs  Increase Vital Sign Frequency  Red: Q 1hr X 4 then Q 4hr X 4, if remains red, continue Q 4hrs  Escalate  MEWS: Escalate Red: discuss with charge nurse/RN and provider, consider discussing with RRT  Notify: Charge Nurse/RN  Name of Charge Nurse/RN Notified Earlie Server, RN  Date Charge Nurse/RN Notified 06/29/20  Time Charge Nurse/RN Notified 0052  Notify: Provider  Provider Name/Title Judd Gaudier, RN  Date Provider Notified 06/29/20  Time Provider Notified [phone]  Notification Type Page  Notification Reason Change in status  Response See new orders  Date of Provider Response 06/29/20  Time of Provider Response 0059  Inserted for Angelica [employer] RN

## 2020-06-30 NOTE — Progress Notes (Signed)
Due to vital signs, NP Breehe notified in person of concerns. Order to turn UF off received. UF turned off, will continue to monitor.

## 2020-06-30 NOTE — Progress Notes (Signed)
[place], [place] 06/27/20  Subjective:   [name] was admitted to [facility] on 07/12/2020 for weakness and poor appetite. Per daughter, the he also complains of body aches and nausea.  Covid -19 +  Patient resting quietly in bed, daughter at bedside.   He is able to open his eyes today and answer simple questions NGT placed for feeding.  Daughter discussed with other family plan to maintain TF for a "couple days to get him stronger."   Objective:  Vital signs in last 24 hours:  Temp:  [97.6 F (36.4 C)-99.1 F (37.3 C)] 97.8 F (36.6 C) (02/05 1245) Pulse Rate:  [54-79] 79 (02/05 1245) Resp:  [12-26] 22 (02/05 1245) BP: (101-168)/(66-87) 157/76 (02/05 1245) SpO2:  [91 %-100 %] 91 % (02/05 1245)  Weight change:  Filed Weights   06/20/20 0519 06/22/20 0458 06/26/20 0500  Weight: 55.1 kg 55.2 kg 54.9 kg    Intake/Output:    Intake/Output Summary (Last 24 hours) at 06/27/2020 1356 Last data filed at 06/27/2020 1300 Gross per 24 hour  Intake 348 ml  Output 0 ml  Net 348 ml     Physical Exam: General:  Resting in bed, chronically ill-appearing  HEENT normocephalic, atraumatic  Pulm/lungs Clear bilaterally, respirations even and unlabored  CVS/Heart  S1-S2 present, no chest pain  Abdomen:   soft, nondistended, nontender  Extremities: No peripheral edema  Neurologic:  Arousable, able to answer simple questions  Skin:  No lesions or rashes  Access:  PD cath ,Left IJ Permcath       Basic Metabolic Panel:  Recent Labs  Lab 06/21/20 0416 06/22/20 0510 06/23/20 0559 06/24/20 [id number] 06/24/20 2159 06/25/20 0759 06/26/20 0536 06/27/20 1051  NA 139   < > 143 140 136 137 134*  --   K 4.8   < > 4.5 4.3 3.8 4.3 4.9  --   CL 98   < > 97* 98 95* 95* 93*  --   CO2 19*   < > 22 20* 21* 22 23  --   GLUCOSE 116*   < > 132* 118* 87 83 168*  --   BUN 128*   < > 116* 109* 51* 60* 48*  --   CREATININE 15.49*   < > 15.88* 16.30* 8.46* 9.52*  6.39*  --   CALCIUM 7.6*   < > 7.7* 7.1* 7.6* 7.6* 8.1*  --   PHOS 9.5*  --  9.1* 10.0*  --   --   --  6.4*   < > = values in this interval not displayed.     CBC: Recent Labs  Lab 06/24/20 0619 06/24/20 2159 06/25/20 0759 06/26/20 0536 06/27/20 0618  WBC 9.2 9.9 9.8 5.5 7.8  NEUTROABS  --  8.7*  --   --   --   HGB 6.9* 8.0* 8.1* 7.7* 7.2*  HCT 20.6* 23.8* 24.0* 23.1* 21.6*  MCV 97.6 99.2 98.0 97.9 97.3  PLT 98* 100* 112* 125* 133*      Lab Results  Component Value Date   HEPBSAG NON REACTIVE 05/06/2020   HEPBSAB NON REACTIVE 09/20/2019   HEPBIGM NON REACTIVE 09/20/2019      Microbiology:  Recent Results (from the past 240 hour(s))  Culture, blood (x 2)     Status: None (Preliminary result)   Collection Time: 06/24/20 10:00 PM   Specimen: BLOOD  Result Value Ref Range Status   Specimen Description BLOOD BLOOD LEFT HAND  Final  Special Requests   Final    BOTTLES DRAWN AEROBIC ONLY Blood Culture results may not be optimal due to an inadequate volume of blood received in culture bottles   Culture   Final    NO GROWTH 3 DAYS Performed at [facility], [address], [place], [place] [address]    Report Status PENDING  Incomplete  Culture, blood (x 2)     Status: None (Preliminary result)   Collection Time: 06/24/20 10:01 PM   Specimen: BLOOD  Result Value Ref Range Status   Specimen Description BLOOD BLOOD RIGHT HAND  Final   Special Requests   Final    BOTTLES DRAWN AEROBIC ONLY Blood Culture results may not be optimal due to an inadequate volume of blood received in culture bottles   Culture   Final    NO GROWTH 3 DAYS Performed at [facility], [address], [place], [place] [address]    Report Status PENDING  Incomplete  MRSA PCR Screening     Status: None   Collection Time: 06/25/20  6:27 PM   Specimen: Nasopharyngeal  Result Value Ref Range Status   MRSA by PCR NEGATIVE NEGATIVE Final    Comment:        The GeneXpert MRSA  Assay (FDA approved for NASAL specimens only), is one component of a comprehensive MRSA colonization surveillance program. It is not intended to diagnose MRSA infection nor to guide or monitor treatment for MRSA infections. Performed at [facility], [address] [place], [place] [address]     Coagulation Studies: Recent Labs    06/24/20 08/27/2157  LABPROT 14.2  INR 1.1    Urinalysis: No results for input(s): COLORURINE, LABSPEC, PHURINE, GLUCOSEU, HGBUR, BILIRUBINUR, KETONESUR, PROTEINUR, UROBILINOGEN, NITRITE, LEUKOCYTESUR in the last 72 hours.  Invalid input(s): APPERANCEUR    Imaging: No results found.   Medications:   . sodium chloride 50 mL/hr at 06/26/20 0030  . acetaminophen 1,000 mg (06/27/20 0600)  . dialysis solution 1.5% low-MG/low-CA    . piperacillin-tazobactam (ZOSYN)  IV 2.25 g (06/27/20 0858)   . sodium chloride   Intravenous Once  . sodium chloride   Intravenous Once  . atorvastatin  40 mg Oral Daily  . benzonatate  100 mg Oral Q8H  . carvedilol  3.125 mg Oral BID WC  . Chlorhexidine Gluconate Cloth  6 each Topical Daily  . epoetin (EPOGEN/PROCRIT) injection  20,000 Units Subcutaneous Weekly  . gentamicin cream  1 application Topical Daily  . guaiFENesin  600 mg Oral BID  . methylPREDNISolone (SOLU-MEDROL) injection  40 mg Intravenous Daily  . multivitamin  1 tablet Oral QHS  . pantoprazole  40 mg Intravenous Q12H  . polyethylene glycol  17 g Oral Daily   acetaminophen **OR** acetaminophen **OR** acetaminophen, calcium carbonate, guaiFENesin-dextromethorphan, lidocaine, loperamide, ondansetron (ZOFRAN) IV, ondansetron (ZOFRAN) IV  Assessment/ Plan:  75 y.o. male with  end stage renal disease on peritoneal dialysis, hypertension, AAA repair, congestive heart failure, peripheral vascular disease, raynaud's syndrome, hyperlipidemia  was admitted on 06/18/2020 for  Active Problems:   Essential hypertension   HFrEF (heart failure with  reduced ejection fraction) (HCC)   End stage renal disease on dialysis (HCC)   Acute blood loss anemia   [id number]   Toxic metabolic encephalopathy   Lobar pneumonia (HCC)   Sepsis with encephalopathy without septic shock (HCC)  Acute GI bleeding [K92.2] Generalized weakness [R53.1] Gastrointestinal hemorrhage, unspecified gastrointestinal hemorrhage type [K92.2] COVID-19 [U07.1]   [facility]  52.5kg CCPD 9 hours 5 exchanges 2056m fills  #. ESRD Receives PD at home Patient will receive hemodialysis while in hospital.  Will receive dialysis today  #. Failure to thrive Likely due to declining health NGT inserted per family request to receive tube feeds   #. Anemia of CKD Hgb remains low- 10.6 Epogen 20,000 units weekly  #. Secondary hyperparathyroidism of renal origin N 25.81   No results found for: PTH Will monitor phosphorus and calcium  We will continue bone mineral metabolism parameters  #Covid -19 positive pneumonia Tested positive on June 18, 2020 No acute respiratory distress noted, patient is on room air Management per [facility]

## 2020-06-30 NOTE — Progress Notes (Signed)
Peritoneal dialysis patient known at [facility], found to be COVID +. Due to home treatments, there will not be any change to this plan, unless patient discharges to a rehab. Please contact me with any dialysis placement concerns.  Elvera Bicker Dialysis Coordinator [phone]

## 2020-06-30 NOTE — Progress Notes (Signed)
Pt agitated, pt has become a one on one ratio for safety. Pt attempting to pull on dialysis lines. And roll over on stomach nearly constantly. Dr. Holley Raring notified of pt agitation and one on one status. MD states to try and continue dialysis due to family wishes.

## 2020-06-30 NOTE — Progress Notes (Signed)
[facility]  PROGRESS NOTE  Scott Oconnor [id number] DOB: Mar 04, 1946 DOA: 07/13/2020 PCP: [name], MD   Brief HPI:   75 year old male with medical history of ESRD on hemodialysis, who was started on peritoneal dialysis at home recently after being switched from hemodialysis, anemia of chronic kidney disease, hypertension, CHF, peripheral vascular disease, hyperlipidemia, Raynaud's syndrome who was admitted on 06/18/2020 for acute GI bleed and Covid pneumonia.  Hospital course was complicated by bacterial pneumonia, sepsis, uremia and metabolic encephalopathy.  He was treated with Solu-Medrol with plans for continued taper at home for COVID-19 pneumonia.  He was not given remdesivir due to family preference.  He was treated with Zosyn with plan to complete course with Augmentin at home for recent diagnosis of lumbar pneumonia.  GI has seen the patient and recommended conservative management.  He was given 3 units PRBC during his hospitalization.  Patient was discharged home and return to ED due to continued weakness, poor p.o. intake since discharge, fever.  As per daughter was noted to have temperature 100.4 at home.  Patient was admitted with sepsis due to fever, tachycardia,  pneumonia with lactic acid 2.7.  He was started on IV Zosyn.    Subjective   Patient seen and examined, NG feeding tube placed per IR today   Assessment/Plan:     1. Severe sepsis due to pneumonia-patient was diagnosed with COVID-19 pneumonia, remdesivir was not given as per family decision.  Patient was discharged on steroid taper.  Also was started on Augmentin for lobar pneumonia.  He has been started on IV Zosyn for total 7 days.  Also on Solu-Medrol 60 mg IV daily.  CRP is 16.4. 2. Recent GI bleed-patient had recent GI bleed, he was on Eliquis which has been discontinued.  GI saw the patient and did not recommend any intervention at that time.  Hemoglobin is stable at 10.6.   No further  evidence of bleeding.  We will continue to monitor hemoglobin/hematocrit. 3. Metabolic encephalopathy-slowly improving, likely in setting of COVID-19 pneumonia, also had uremia during previous admission.  Ammonia level is 14.CT head showed no acute changes. 4. ESRD-patient was on PD at home, nephrology has been consulted and plan for hemodialysis while in the hospital. 5. Failure to thrive/poor p.o. intake-patient has been declining medically since 20th January, as per daughter patient has very poor p.o. intake.  Discussed with patient's daughter and her cousin from [place] who is a physician.  Family wants to try NG tube feeding for few days and see if that helps patient mental status, and poor appetite.  NG feeding tube has been inserted.  Dietitian consulted for tube feeds.   COVID-19 Labs  No results for input(s): DDIMER, FERRITIN, LDH, CRP in the last 72 hours.  Lab Results  Component Value Date   SARSCOV2NAA NEGATIVE 05/06/2020   [place] NEGATIVE 04/10/2020   [place] NEGATIVE 11/18/2019   [place] NEGATIVE 09/20/2019     Scheduled medications:   . benzonatate  100 mg Oral Q8H  . carvedilol  3.125 mg Oral BID WC  . Chlorhexidine Gluconate Cloth  6 each Topical Daily  . feeding supplement (PROSource TF)  45 mL Per Tube TID  . LORazepam  1 mg Intravenous Once  . methylPREDNISolone (SOLU-MEDROL) injection  16 mg Intravenous Daily  . multivitamin  1 tablet Oral QHS  . pantoprazole (PROTONIX) IV  40 mg Intravenous Q12H  . sodium chloride flush  3 mL Intravenous Q12H  CBG: Recent Labs  Lab 06/29/20 1620 06/29/20 2016 06/30/20 0004 06/30/20 0357 06/30/20 0802  GLUCAP 122* 132* 130* 137* 119*    SpO2: 98 % O2 Flow Rate (L/min): 2 L/min    CBC: Recent Labs  Lab 06/24/20 2159 06/25/20 0759 06/26/20 0536 06/27/20 0618 07/20/2020 1725 06/29/20 0213 06/30/20 0515  WBC 9.9   < > 5.5 7.8 10.8* 11.1* 6.5  NEUTROABS 8.7*  --   --   --  9.1* 9.2*  --    HGB 8.0*   < > 7.7* 7.2* 10.7* 10.7* 10.6*  HCT 23.8*   < > 23.1* 21.6* 31.4* 31.9* 31.8*  MCV 99.2   < > 97.9 97.3 95.7 96.4 96.7  PLT 100*   < > 125* 133* 128* 134* 112*   < > = values in this interval not displayed.    Basic Metabolic Panel: Recent Labs  Lab 06/24/20 0619 06/24/20 2159 06/25/20 0759 06/26/20 0536 06/27/20 1051 07/04/2020 1920 06/29/20 0213 06/29/20 0517 06/30/20 0515 06/30/20 1035  NA 140   < > 137 134*  --  136 135  --  138  --   K 4.3   < > 4.3 4.9  --  5.2* 4.4  --  4.8  --   CL 98   < > 95* 93*  --  95* 95*  --  97*  --   CO2 20*   < > 22 23  --  24 18*  --  18*  --   GLUCOSE 118*   < > 83 168*  --  100* 123*  --  135*  --   BUN 109*   < > 60* 48*  --  64* 71*  --  97*  --   CREATININE 16.30*   < > 9.52* 6.39*  --  6.84* 7.49*  --  9.26*  --   CALCIUM 7.1*   < > 7.6* 8.1*  --  7.0* 7.1*  --  7.7*  --   MG  --   --   --   --   --   --   --  2.4 2.5*  --   PHOS 10.0*  --   --   --  6.4*  --   --  9.0* 12.3* 12.7*   < > = values in this interval not displayed.     Liver Function Tests: Recent Labs  Lab 06/24/20 0619 06/24/20 2159 06/29/20 0213 06/30/20 0515  AST  --  62* 44*  44* 33  ALT  --  '10 16  15 14  '$ ALKPHOS  --  44 49  51 42  BILITOT  --  1.1 1.2  1.2 1.1  PROT  --  5.9* 6.0*  6.1* 5.3*  ALBUMIN 2.5* 2.6* 2.7*  2.7* 2.3*     Antibiotics: Anti-infectives (From admission, onward)   Start     Dose/Rate Route Frequency Ordered Stop   06/29/20 1000  remdesivir 100 mg in sodium chloride 0.9 % 100 mL IVPB       "Followed by" Linked Group Details   100 mg 200 mL/hr over 30 Minutes Intravenous Daily 06/29/2020 2338 07/03/20 0959   06/29/20 0300  piperacillin-tazobactam (ZOSYN) IVPB 2.25 g        2.25 g 100 mL/hr over 30 Minutes Intravenous Every 8 hours 07/19/2020 2343     06/29/20 0230  azithromycin (ZITHROMAX) 500 mg in sodium chloride 0.9 % 250 mL IVPB  Status:  Discontinued  500 mg 250 mL/hr over 60 Minutes Intravenous  Every 24 hours 06/29/20 0130 06/29/20 0130   06/27/2020 2345  remdesivir 200 mg in sodium chloride 0.9% 250 mL IVPB       "Followed by" Linked Group Details   200 mg 580 mL/hr over 30 Minutes Intravenous Once 06/30/2020 2338 06/29/20 0131   07/11/2020 1815  piperacillin-tazobactam (ZOSYN) IVPB 3.375 g        3.375 g 100 mL/hr over 30 Minutes Intravenous  Once 06/24/2020 1809 06/27/2020 1921       DVT prophylaxis: SCDs  Code Status: Full code  Family Communication: Discussed with patient and daughter at bedside   Consultants:  Nephrology  Procedures:      Objective   Vitals:   06/30/20 1300 06/30/20 1315 06/30/20 1330 06/30/20 1342  BP: 113/80 111/80 127/87 124/81  Pulse: (!) 104 (!) 103 (!) 105 99  Resp: '20 20 20 20  '$ Temp:      TempSrc:      SpO2:      Weight:      Height:        Intake/Output Summary (Last 24 hours) at 06/30/2020 1416 Last data filed at 06/30/2020 1342 Gross per 24 hour  Intake 200.05 ml  Output -277 ml  Net 477.05 ml    02/06 1901 - 02/08 0700 In: 200.1  Out: -   Filed Weights   06/29/2020 1636 06/29/20 0500 06/30/20 0500  Weight: 55 kg 55 kg 55.4 kg    Physical Examination:  General-appears lethargic Heart-S1-S2, regular, no murmur auscultated Lungs-clear to auscultation bilaterally, no wheezing or crackles auscultated Abdomen-soft, nontender, no organomegaly Extremities-no edema in the lower extremities Neuro-somnolent, opens eyes to verbal stimuli, follows commands  Status is: Inpatient  Dispo: The patient is from: Home              Anticipated d/c is to: Home versus skilled nursing facility              Anticipated d/c date is: 07/02/2020              Patient currently not stable for discharge  Barrier to discharge-ongoing treatment for sepsis, poor p.o. intake       Data Reviewed:   Recent Results (from the past 240 hour(s))  Culture, blood (x 2)     Status: None   Collection Time: 06/24/20 10:00 PM   Specimen: BLOOD   Result Value Ref Range Status   Specimen Description BLOOD BLOOD LEFT HAND  Final   Special Requests   Final    BOTTLES DRAWN AEROBIC ONLY Blood Culture results may not be optimal due to an inadequate volume of blood received in culture bottles   Culture   Final    NO GROWTH 5 DAYS Performed at [facility], [address] [place], [place] [address]    Report Status 06/29/2020 FINAL  Final  Culture, blood (x 2)     Status: None   Collection Time: 06/24/20 10:01 PM   Specimen: BLOOD  Result Value Ref Range Status   Specimen Description BLOOD BLOOD RIGHT HAND  Final   Special Requests   Final    BOTTLES DRAWN AEROBIC ONLY Blood Culture results may not be optimal due to an inadequate volume of blood received in culture bottles   Culture   Final    NO GROWTH 5 DAYS Performed at [facility], [address] [place], [place] [address]    Report Status 06/29/2020 FINAL  Final  MRSA PCR Screening     Status: None   Collection Time: 06/25/20  6:27 PM   Specimen: Nasopharyngeal  Result Value Ref Range Status   MRSA by PCR NEGATIVE NEGATIVE Final    Comment:        The GeneXpert MRSA Assay (FDA approved for NASAL specimens only), is one component of a comprehensive MRSA colonization surveillance program. It is not intended to diagnose MRSA infection nor to guide or monitor treatment for MRSA infections. Performed at [facility], [address] [place], [place] [address]   Blood culture (routine x 2)     Status: None (Preliminary result)   Collection Time: 06/27/2020  5:26 PM   Specimen: BLOOD  Result Value Ref Range Status   Specimen Description BLOOD RIGHT ANTECUBITAL  Final   Special Requests   Final    BOTTLES DRAWN AEROBIC AND ANAEROBIC Blood Culture results may not be optimal due to an inadequate volume of blood received in culture bottles   Culture   Final    NO GROWTH 2 DAYS Performed at [facility], [address]  [place], [place] [address]    Report Status PENDING  Incomplete  Blood culture (routine x 2)     Status: None (Preliminary result)   Collection Time: 06/25/2020  5:26 PM   Specimen: BLOOD  Result Value Ref Range Status   Specimen Description BLOOD LEFT ANTECUBITAL  Final   Special Requests   Final    BOTTLES DRAWN AEROBIC AND ANAEROBIC Blood Culture results may not be optimal due to an inadequate volume of blood received in culture bottles   Culture   Final    NO GROWTH 2 DAYS Performed at [facility], [address], [place], [place] [address]    Report Status PENDING  Incomplete    Recent Labs  Lab 06/29/20 0213  LIPASE 142*   Recent Labs  Lab 06/29/20 0517  AMMONIA 14    Cardiac Enzymes: Recent Labs  Lab 07/12/2020 1725  CKTOTAL 341   BNP (last 3 results) Recent Labs    09/29/19 0642 11/18/19 2118 05/06/20 0214  BNP 1,195.0* >4,500.0* >4,500.0*    ProBNP (last 3 results) No results for input(s): PROBNP in the last 8760 hours.  Studies:  DG Abd 1 View  Result Date: 06/29/2020 CLINICAL DATA:  75 year old male status post NG placement. EXAM: ABDOMEN - 1 VIEW COMPARISON:  Chest radiograph dated 07/12/2020. FINDINGS: Partially visualized tube curved in the lower neck above the thoracic inlet. Recommend retraction and repositioning. Dialysis catheter in similar position. Small left pleural effusion and left lung base atelectasis or infiltrate. No pneumothorax. Stable cardiac silhouette. Atherosclerotic calcification of the aorta. No acute osseous pathology. Partially visualized endovascular stent graft of the distal abdominal aorta. IMPRESSION: 1. Partially visualized tube curved in the lower neck above the thoracic inlet. Recommend retraction and repositioning. 2. Small left pleural effusion and left lung base atelectasis or infiltrate, increased since the prior radiograph. Electronically Signed   By: Anner Crete M.D.   On: 06/29/2020 16:59   CT HEAD WO  CONTRAST  Result Date: 06/29/2020 CLINICAL DATA:  Altered mental status.  CNS infection suspected. EXAM: CT HEAD WITHOUT CONTRAST TECHNIQUE: Contiguous axial images were obtained from the base of the skull through the vertex without intravenous contrast. COMPARISON:  06/23/2020 FINDINGS: Brain: The study suffers from motion degradation. Age related atrophy. Chronic small-vessel ischemic changes of the hemispheric white matter. 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 No  sign of acute infarction, mass lesion, hemorrhage, hydrocephalus or extra-axial collection. Vascular: There is atherosclerotic calcification of the major vessels at the base of the brain. Skull: Negative Sinuses/Orbits: Clear except for mild mucosal disease in the ethmoid sinuses right more than left. Orbits negative. Other: None IMPRESSION: Motion degraded study. No acute finding by CT. Chronic small-vessel ischemic changes of the white matter. 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 Electronically Signed   By: Nelson Chimes M.D.   On: 06/29/2020 00:33   DG Chest Portable 1 View  Result Date: 06/23/2020 CLINICAL DATA:  Weakness, failure to thrive EXAM: PORTABLE CHEST 1 VIEW COMPARISON:  Chest radiograph June 25, 2020 FINDINGS: Unchanged position of the left approach central venous catheter. Similar cardiomegaly. Similar diffuse bilateral interstitial prominence consistent with interstitial edema, infection or pneumonitis. Decreased opacification base. Decreased small left pleural effusion. The visualized skeletal structures are unchanged. Pneumoperitoneum again visualized. IMPRESSION: 1. Similar diffuse bilateral interstitial prominence consistent with interstitial edema, infection or pneumonitis. Decreased small left pleural effusion and left basilar opacification, likely atelectasis. 2. Pneumoperitoneum again visualized. Electronically Signed   By: Dahlia Bailiff MD   On: 07/10/2020 17:29   DG [place] Dys Tube Plc W/Fl W/Rad  Result  Date: 06/30/2020 CLINICAL DATA:  NG tube placement EXAM: NASO G TUBE PLACEMENT WITH FL AND WITH RAD FLUOROSCOPY TIME:  Fluoroscopy Time:  12 seconds Radiation Exposure Index (if provided by the fluoroscopic device): 1.8 mGy Number of Acquired Spot Images: 0 COMPARISON:  None. FINDINGS: Nasogastric tube inserted through the right nostril and advanced into the stomach without difficulty. Fluoroscopic spot image demonstrates satisfactory position of the nasogastric tube in the stomach. Nasogastric tube was secured to the nose. IMPRESSION: Successful placement of a nasogastric tube with the tip in the stomach. Electronically Signed   By: Kathreen Devoid   On: 06/30/2020 13:01       Oswald Hillock   [facility] If 7PM-7AM, please contact night-coverage at [email or web address], Office  [phone]   06/30/2020, 2:16 PM  LOS: 2 days

## 2020-07-01 ENCOUNTER — Inpatient Hospital Stay: Admit: 2020-07-01 | Payer: Medicare Other

## 2020-07-01 ENCOUNTER — Inpatient Hospital Stay: Payer: Medicare Other

## 2020-07-01 DIAGNOSIS — U071 COVID-19: Secondary | ICD-10-CM | POA: Diagnosis not present

## 2020-07-01 DIAGNOSIS — J1282 Pneumonia due to coronavirus disease 2019: Secondary | ICD-10-CM

## 2020-07-01 DIAGNOSIS — I214 Non-ST elevation (NSTEMI) myocardial infarction: Secondary | ICD-10-CM

## 2020-07-01 DIAGNOSIS — Z992 Dependence on renal dialysis: Secondary | ICD-10-CM

## 2020-07-01 DIAGNOSIS — N186 End stage renal disease: Secondary | ICD-10-CM | POA: Diagnosis not present

## 2020-07-01 DIAGNOSIS — R627 Adult failure to thrive: Secondary | ICD-10-CM | POA: Diagnosis not present

## 2020-07-01 DIAGNOSIS — I5020 Unspecified systolic (congestive) heart failure: Secondary | ICD-10-CM

## 2020-07-01 LAB — COMPREHENSIVE METABOLIC PANEL
ALT: 15 U/L (ref 0–44)
AST: 39 U/L (ref 15–41)
Albumin: 2.5 g/dL — ABNORMAL LOW (ref 3.5–5.0)
Alkaline Phosphatase: 47 U/L (ref 38–126)
Anion gap: 20 — ABNORMAL HIGH (ref 5–15)
BUN: 62 mg/dL — ABNORMAL HIGH (ref 8–23)
CO2: 21 mmol/L — ABNORMAL LOW (ref 22–32)
Calcium: 7.8 mg/dL — ABNORMAL LOW (ref 8.9–10.3)
Chloride: 96 mmol/L — ABNORMAL LOW (ref 98–111)
Creatinine, Ser: 6.03 mg/dL — ABNORMAL HIGH (ref 0.61–1.24)
GFR, Estimated: 9 mL/min — ABNORMAL LOW (ref >60)
Glucose, Bld: 197 mg/dL — ABNORMAL HIGH (ref 70–99)
Potassium: 4 mmol/L (ref 3.5–5.1)
Sodium: 137 mmol/L (ref 135–145)
Total Bilirubin: 1 mg/dL (ref 0.3–1.2)
Total Protein: 5.5 g/dL — ABNORMAL LOW (ref 6.5–8.1)

## 2020-07-01 LAB — CBC
HCT: 31 % — ABNORMAL LOW (ref 39.0–52.0)
Hemoglobin: 10.5 g/dL — ABNORMAL LOW (ref 13.0–17.0)
MCH: 32.2 pg (ref 26.0–34.0)
MCHC: 33.9 g/dL (ref 30.0–36.0)
MCV: 95.1 fL (ref 80.0–100.0)
Platelets: 124 10*3/uL — ABNORMAL LOW (ref 150–400)
RBC: 3.26 MIL/uL — ABNORMAL LOW (ref 4.22–5.81)
RDW: 17.4 % — ABNORMAL HIGH (ref 11.5–15.5)
WBC: 5.3 10*3/uL (ref 4.0–10.5)
nRBC: 3 % — ABNORMAL HIGH (ref 0.0–0.2)

## 2020-07-01 LAB — TROPONIN I (HIGH SENSITIVITY)
Troponin I (High Sensitivity): 1196 ng/L (ref <18)
Troponin I (High Sensitivity): 1207 ng/L (ref <18)
Troponin I (High Sensitivity): 988 ng/L (ref <18)
Troponin I (High Sensitivity): 905 ng/L (ref <18)

## 2020-07-01 LAB — GLUCOSE, CAPILLARY
Glucose-Capillary: 203 mg/dL — ABNORMAL HIGH (ref 70–99)
Glucose-Capillary: 176 mg/dL — ABNORMAL HIGH (ref 70–99)
Glucose-Capillary: 258 mg/dL — ABNORMAL HIGH (ref 70–99)
Glucose-Capillary: 230 mg/dL — ABNORMAL HIGH (ref 70–99)
Glucose-Capillary: 238 mg/dL — ABNORMAL HIGH (ref 70–99)

## 2020-07-01 LAB — PROTIME-INR
INR: 1.5 — ABNORMAL HIGH (ref 0.8–1.2)
Prothrombin Time: 17.7 seconds — ABNORMAL HIGH (ref 11.4–15.2)

## 2020-07-01 LAB — APTT: aPTT: 47 seconds — ABNORMAL HIGH (ref 24–36)

## 2020-07-01 LAB — MAGNESIUM: Magnesium: 2.2 mg/dL (ref 1.7–2.4)

## 2020-07-01 LAB — HEPARIN LEVEL (UNFRACTIONATED): Heparin Unfractionated: 0.41 IU/mL (ref 0.30–0.70)

## 2020-07-01 LAB — PHOSPHORUS: Phosphorus: 8.8 mg/dL — ABNORMAL HIGH (ref 2.5–4.6)

## 2020-07-01 MED ORDER — LOPERAMIDE HCL 2 MG/15ML PO SOLN
2.0000 mg | ORAL | Status: DC | PRN
  Filled 2020-07-01 (×3): qty 15

## 2020-07-01 MED ORDER — HEPARIN BOLUS VIA INFUSION
3000.0000 [IU] | Freq: Once | INTRAVENOUS | Status: AC
  Administered 2020-07-01: 15:00:00 3000 [IU] via INTRAVENOUS
  Filled 2020-07-01: qty 3000

## 2020-07-01 MED ORDER — RENA-VITE PO TABS
1.0000 | ORAL_TABLET | Freq: Every day | ORAL | Status: DC
  Administered 2020-07-02 – 2020-07-04 (×4): 1
  Filled 2020-07-01 (×4): qty 1

## 2020-07-01 MED ORDER — CARVEDILOL 6.25 MG PO TABS
6.2500 mg | ORAL_TABLET | Freq: Two times a day (BID) | ORAL | Status: DC
  Administered 2020-07-01 – 2020-07-04 (×7): 6.25 mg
  Filled 2020-07-01 (×7): qty 1

## 2020-07-01 MED ORDER — NITROGLYCERIN 0.4 MG SL SUBL
0.4000 mg | SUBLINGUAL_TABLET | SUBLINGUAL | Status: DC | PRN
  Administered 2020-07-01 – 2020-07-02 (×3): 0.4 mg via SUBLINGUAL
  Filled 2020-07-01 (×3): qty 1

## 2020-07-01 MED ORDER — LABETALOL HCL 5 MG/ML IV SOLN
5.0000 mg | Freq: Once | INTRAVENOUS | Status: AC
  Administered 2020-07-01: 5 mg via INTRAVENOUS
  Filled 2020-07-01: qty 4

## 2020-07-01 MED ORDER — HEPARIN (PORCINE) 25000 UT/250ML-% IV SOLN
660.0000 [IU]/h | INTRAVENOUS | Status: DC
  Administered 2020-07-01 – 2020-07-03 (×2): 650 [IU]/h via INTRAVENOUS
  Filled 2020-07-01 (×2): qty 250

## 2020-07-01 MED ORDER — LOPERAMIDE HCL 1 MG/7.5ML PO SUSP
2.0000 mg | ORAL | Status: DC | PRN
  Administered 2020-07-01 – 2020-07-02 (×3): 2 mg
  Filled 2020-07-01 (×4): qty 15

## 2020-07-01 MED ORDER — HYDRALAZINE HCL 25 MG PO TABS
25.0000 mg | ORAL_TABLET | Freq: Three times a day (TID) | ORAL | Status: DC
Start: 2020-07-01 — End: 2020-07-02
  Administered 2020-07-01 – 2020-07-02 (×4): 25 mg via ORAL
  Filled 2020-07-01 (×4): qty 1

## 2020-07-01 NOTE — Consult Note (Signed)
ANTICOAGULATION CONSULT NOTE - Initial Consult  Pharmacy Consult for heparin  Indication: chest pain/ACS  Allergies  Allergen Reactions  . Ivp Dye [Iodinated Diagnostic Agents]     ESRD patient     Patient Measurements: Height: '5\' 10"'$  (177.8 cm) Weight: 55.4 kg (122 lb 2.2 oz) IBW/kg (Calculated) : 73 Heparin Dosing Weight: 55 kg  Vital Signs: Temp: 98.3 F (36.8 C) (02/09 1114) Temp Source: Oral (02/09 1114) BP: 157/108 (02/09 1114) Pulse Rate: 97 (02/09 1114)  Labs: Recent Labs    07/14/2020 1725 06/30/2020 1920 06/29/20 0213 06/30/20 0515 07/01/20 0515 07/01/20 1255  HGB 10.7*  --  10.7* 10.6* 10.5*  --   HCT 31.4*  --  31.9* 31.8* 31.0*  --   PLT 128*  --  134* 112* 124*  --   APTT  --   --  41*  --   --   --   LABPROT  --   --  14.3  --   --   --   INR  --   --  1.2  --   --   --   CREATININE  --    < > 7.49* 9.26* 6.03*  --   CKTOTAL 341  --   --   --   --   --   TROPONINIHS  --   --   --   --   --  1,196*   < > = values in this interval not displayed.    Estimated Creatinine Clearance: 8.5 mL/min (A) (by C-G formula based on SCr of 6.03 mg/dL (H)).   Medical History: Past Medical History:  Diagnosis Date  . Hyperlipidemia 07/2019  . Hypertension 07/2019  . PVD (peripheral vascular disease) ([place]) 07/2019  . Renal disorder 07/2019   ESRD  . 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 He returned due to continued weakness and poor PO intake since discharge. His EKG on 2/9 shows acute changes and was found to have a troponin elevated to 1196. Pharmacy consulted to start heparin.  Heparin Dosing Weight: 55 kg Hgb: 10.5 and plts:  124  Date Time HL Rate/comment   Goal of Therapy:  Heparin level 0.3-0.7 units/ml Monitor platelets by anticoagulation protocol: Yes   Plan:   Give 3000 units bolus x 1 Start heparin infusion at 650 units/hr Check anti-Xa level in 8 hours and daily while on heparin Continue to monitor H&H and platelets  Darnelle Bos, PharmD 07/01/2020,2:23 PM

## 2020-07-01 NOTE — Consult Note (Signed)
Cardiology Consultation:   Patient ID: [name] MRN: [id number]; DOB: Jul 22, 1945  Admit date: 07/04/2020 Date of Consult: 07/01/2020  Primary Care Provider: [name], MD [facility] Cardiologist: Ida Rogue, MD  [facility] Electrophysiologist:  None    Patient Profile:   Scott Oconnor is a 75 y.o. male with a hx of end-stage renal disease, HFrEF who is being seen today for the evaluation of elevated troponins at the request of Dr. Jimmye Norman.  History of Present Illness:   Scott Oconnor is a 75 year old gentleman with history of hypertension, end-stage renal disease on dialysis, HFrEF, hypertension, presenting with weakness, poor appetite and body aches.  Patient recently discharged after being admitted admitted with COVID-19 diagnosis.  Upon discharge, he had poor p.o. intake.  While at home, his oral intake did not improve, family noted patient became weak, and febrile.  He was readmitted 3 days ago due to weakness, failure to thrive.  Chest x-ray showed diffuse bilateral interstitial prominence consistent with infection.   He complains of one episode of chest discomfort yesterday, prompting evaluation with troponin testing.  Troponin level was elevated at 1196, 1207.  Patient started on heparin drip per ACS protocol.  Upon my exam, patient not able to order speech.  NG tube with tube feeding noted.  Daughter at bedside.   Past Medical History:  Diagnosis Date  . Hyperlipidemia 07/2019  . Hypertension 07/2019  . PVD (peripheral vascular disease) ([place]) 07/2019  . Renal disorder 07/2019   ESRD  . 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 DIALYSIS/PERMA CATHETER INSERTION N/A 09/26/2019   Procedure: DIALYSIS/PERMA CATHETER INSERTION;  Surgeon: Algernon Huxley, MD;  Location: [place] [facility];  Service: Cardiovascular;  Laterality: N/A;  . DIALYSIS/PERMA CATHETER INSERTION N/A 06/23/2020   Procedure:  DIALYSIS/PERMA CATHETER INSERTION;  Surgeon: Algernon Huxley, MD;  Location: [place] [facility];  Service: Cardiovascular;  Laterality: N/A;  . DIALYSIS/PERMA CATHETER REMOVAL N/A 06/19/2020   Procedure: DIALYSIS/PERMA CATHETER REMOVAL;  Surgeon: Algernon Huxley, MD;  Location: [place] [facility];  Service: Cardiovascular;  Laterality: N/A;  . ENDOVASCULAR REPAIR/STENT GRAFT N/A 08/14/2019   Procedure: ENDOVASCULAR REPAIR/STENT GRAFT;  Surgeon: Katha Cabal, MD;  Location: [place] [facility];  Service: Cardiovascular;  Laterality: N/A;     Home Medications:  Prior to Admission medications   Medication Sig Start Date End Date Taking? Authorizing Provider  acetaminophen (TYLENOL) 500 MG tablet Take 500-1,000 mg by mouth every 8 (eight) hours as needed for mild pain or moderate pain.    Yes [provider]  calcium carbonate (TUMS - DOSED IN MG ELEMENTAL CALCIUM) 500 MG chewable tablet Chew 1 tablet by mouth 3 (three) times daily as needed for heartburn.   Yes [provider]  multivitamin (RENA-VIT) TABS tablet Take 1 tablet by mouth at bedtime. 09/30/19  Yes Fritzi Mandes, MD  albuterol (VENTOLIN HFA) 108 (90 Base) MCG/ACT inhaler SMARTSIG:2 Inhalation Via Inhaler Every 6 Hours PRN Patient not taking: No sig reported 06/12/20   [provider]  amoxicillin-clavulanate (AUGMENTIN) 875-125 MG tablet Take 1 tablet by mouth 2 (two) times daily for 4 days. 06/27/20 07/01/20  Loletha Grayer, MD  benzonatate (TESSALON) 100 MG capsule Take 1 capsule (100 mg total) by mouth every 8 (eight) hours. 06/27/20   Loletha Grayer, MD  carvedilol (COREG) 3.125 MG tablet Take 1 tablet (3.125 mg total) by mouth 2 (two) times daily with a meal. 06/27/20  Loletha Grayer, MD  lidocaine (XYLOCAINE) 2 % solution Use as directed 15 mLs in the mouth or throat every 4 (four) hours as needed for mouth pain. 06/27/20   Loletha Grayer, MD  ondansetron (ZOFRAN) 4 MG tablet Take 1 tablet (4 mg total) by  mouth daily as needed for nausea or vomiting. 06/27/20 06/27/21  Loletha Grayer, MD  pantoprazole (PROTONIX) 40 MG tablet Take 1 tablet 2 times a day for 30 days, then change to 1 tablet a day 06/27/20   Loletha Grayer, MD  predniSONE (DELTASONE) 10 MG tablet Two tabs po daily for six days 06/27/20   Loletha Grayer, MD    Inpatient Medications: Scheduled Meds: . benzonatate  100 mg Oral Q8H  . carvedilol  6.25 mg Per Tube BID WC  . Chlorhexidine Gluconate Cloth  6 each Topical Daily  . feeding supplement (PROSource TF)  45 mL Per Tube TID  . hydrALAZINE  25 mg Oral Q8H  . LORazepam  1 mg Intravenous Once  . methylPREDNISolone (SOLU-MEDROL) injection  16 mg Intravenous Daily  . multivitamin  1 tablet Per Tube QHS  . pantoprazole (PROTONIX) IV  40 mg Intravenous Q12H  . sodium chloride flush  3 mL Intravenous Q12H   Continuous Infusions: . albumin human    . feeding supplement (NEPRO CARB STEADY) 45 mL/hr at 07/01/20 0233  . heparin 650 Units/hr (07/01/20 1523)  . piperacillin-tazobactam (ZOSYN)  IV 2.25 g (07/01/20 1110)  . remdesivir 100 mg in NS 100 mL 100 mg (07/01/20 0944)   PRN Meds: acetaminophen (TYLENOL) oral liquid 160 mg/5 mL, albuterol, hydrALAZINE, loperamide HCl, morphine injection, ondansetron **OR** ondansetron (ZOFRAN) IV  Allergies:    Allergies  Allergen Reactions  . Ivp Dye [Iodinated Diagnostic Agents]     ESRD patient     Social History:   Social History   Socioeconomic History  . Marital status: Married    Spouse name: Not on file  . Number of children: Not on file  . Years of education: Not on file  . Highest education level: Not on file  Occupational History  . Not on file  Tobacco Use  . Smoking status: Former Smoker    Packs/day: 0.33    Types: Cigarettes  . Smokeless tobacco: Never Used  . Tobacco comment: 6 cigarettes daily   Vaping Use  . Vaping Use: Never used  Substance and Sexual Activity  . Alcohol use: No  . Drug use: No  .  Sexual activity: Yes  Other Topics Concern  . Not on file  Social History Narrative  . Not on file   Social Determinants of Health   Financial Resource Strain: Not on file  Food Insecurity: Not on file  Transportation Needs: Not on file  Physical Activity: Not on file  Stress: Not on file  Social Connections: Not on file  Intimate Partner Violence: Not on file    Family History:    Family History  Problem Relation Age of Onset  . Heart disease Father   . Sudden Cardiac Death Father   . Heart disease Brother   . Hyperlipidemia Brother   . Heart disease Sister   . Heart disease Brother   . Hyperlipidemia Brother   . Hyperlipidemia Brother   . Hyperlipidemia Brother   . Hyperlipidemia Brother   . Hyperlipidemia Brother   . Heart disease Sister      ROS:  Please see the history of present illness.   All other ROS reviewed and negative.  Physical Exam/Data:   Vitals:   07/01/20 0600 07/01/20 0756 07/01/20 1114 07/01/20 1625  BP: (!) 153/101 (!) 158/102 (!) 157/108 (!) 178/117  Pulse: 95 94 97 94  Resp: '18 20 17 18  '$ Temp: 97.9 F (36.6 C) 98.5 F (36.9 C) 98.3 F (36.8 C) 98.4 F (36.9 C)  TempSrc: Axillary  Oral   SpO2: 100% 96%  95%  Weight:      Height:        Intake/Output Summary (Last 24 hours) at 07/01/2020 1634 Last data filed at 07/01/2020 0436 Gross per 24 hour  Intake 539.33 ml  Output --  Net 539.33 ml   Last 3 Weights 06/30/2020 06/29/2020 07/15/2020  Weight (lbs) 122 lb 2.2 oz 121 lb 4.1 oz 121 lb 4.1 oz  Weight (kg) 55.4 kg 55 kg 55 kg     Body mass index is 17.52 kg/m.  General: Appears weak, no clear speech HEENT: normal Lymph: no adenopathy Neck: no JVD Endocrine:  No thryomegaly Vascular: No carotid bruits; FA pulses 2+ bilaterally without bruits  Cardiac: Regular rate and rhythm Lungs: Decreased breath sounds at bases Abd: soft, nontender, no hepatomegaly  Ext: no edema Musculoskeletal:  No deformities, BUE and BLE strength normal  and equal Skin: warm and dry  Neuro: Generalized weakness Psych: Unable to assess  EKG:  The EKG was personally reviewed and demonstrates: Sinus rhythm, PACs Telemetry:  Telemetry was personally reviewed and demonstrates: Sinus rhythm  Relevant CV Studies: Echocardiogram 01/2020 1. Left ventricular ejection fraction, by estimation, is 40 to 45%. The  left ventricle has mild to moderately decreased function. The left  ventricle demonstrates global hypokinesis. Left ventricular diastolic  parameters are consistent with Grade II  diastolic dysfunction (pseudonormalization).  2. Right ventricular systolic function is normal. The right ventricular  size is normal.  3. The mitral valve is normal in structure. Mild mitral valve  regurgitation.  4. The aortic valve is tricuspid. Aortic valve regurgitation is mild.   Laboratory Data:  High Sensitivity Troponin:   Recent Labs  Lab 06/08/20 1452 06/18/20 1751 06/18/20 2000 07/01/20 1255 07/01/20 1518  TROPONINIHS 40* 101* 92* 1,196* 1,207*     Chemistry Recent Labs  Lab 06/29/20 0213 06/30/20 0515 07/01/20 0515  NA 135 138 137  K 4.4 4.8 4.0  CL 95* 97* 96*  CO2 18* 18* 21*  GLUCOSE 123* 135* 197*  BUN 71* 97* 62*  CREATININE 7.49* 9.26* 6.03*  CALCIUM 7.1* 7.7* 7.8*  GFRNONAA 7* 5* 9*  ANIONGAP 22* 23* 20*    Recent Labs  Lab 06/29/20 0213 06/30/20 0515 07/01/20 0515  PROT 6.0*  6.1* 5.3* 5.5*  ALBUMIN 2.7*  2.7* 2.3* 2.5*  AST 44*  44* 33 39  ALT '16  15 14 15  '$ ALKPHOS 49  51 42 47  BILITOT 1.2  1.2 1.1 1.0   Hematology Recent Labs  Lab 06/29/20 0213 06/30/20 0515 07/01/20 0515  WBC 11.1* 6.5 5.3  RBC 3.31* 3.29* 3.26*  HGB 10.7* 10.6* 10.5*  HCT 31.9* 31.8* 31.0*  MCV 96.4 96.7 95.1  MCH 32.3 32.2 32.2  MCHC 33.5 33.3 33.9  RDW 16.5* 16.8* 17.4*  PLT 134* 112* 124*   BNPNo results for input(s): BNP, PROBNP in the last 168 hours.  DDimer No results for input(s): DDIMER in the last 168  hours.   Radiology/Studies:  DG Abd 1 View  Result Date: 06/29/2020 CLINICAL DATA:  75 year old male status post NG placement. EXAM: ABDOMEN - 1 VIEW  COMPARISON:  Chest radiograph dated 07/02/2020. FINDINGS: Partially visualized tube curved in the lower neck above the thoracic inlet. Recommend retraction and repositioning. Dialysis catheter in similar position. Small left pleural effusion and left lung base atelectasis or infiltrate. No pneumothorax. Stable cardiac silhouette. Atherosclerotic calcification of the aorta. No acute osseous pathology. Partially visualized endovascular stent graft of the distal abdominal aorta. IMPRESSION: 1. Partially visualized tube curved in the lower neck above the thoracic inlet. Recommend retraction and repositioning. 2. Small left pleural effusion and left lung base atelectasis or infiltrate, increased since the prior radiograph. Electronically Signed   By: Anner Crete M.D.   On: 06/29/2020 16:59   CT HEAD WO CONTRAST  Result Date: 06/29/2020 CLINICAL DATA:  Altered mental status.  CNS infection suspected. EXAM: CT HEAD WITHOUT CONTRAST TECHNIQUE: Contiguous axial images were obtained from the base of the skull through the vertex without intravenous contrast. COMPARISON:  06/23/2020 FINDINGS: Brain: The study suffers from motion degradation. Age related atrophy. Chronic small-vessel ischemic changes of the hemispheric white matter. 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 No sign of acute infarction, mass lesion, hemorrhage, hydrocephalus or extra-axial collection. Vascular: There is atherosclerotic calcification of the major vessels at the base of the brain. Skull: Negative Sinuses/Orbits: Clear except for mild mucosal disease in the ethmoid sinuses right more than left. Orbits negative. Other: None IMPRESSION: Motion degraded study. No acute finding by CT. Chronic small-vessel ischemic changes of the white matter. 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  Electronically Signed   By: Nelson Chimes M.D.   On: 06/29/2020 00:33   DG Chest Port 1 View  Result Date: 07/01/2020 CLINICAL DATA:  Fluid overload EXAM: PORTABLE CHEST 1 VIEW COMPARISON:  06/30/2020 FINDINGS: Cardiac shadow is within normal limits. Aortic calcifications are seen. Gastric catheter and dialysis catheter are noted in satisfactory position. Increased left basilar infiltrate with associated effusion is noted. No other focal parenchymal abnormality is noted. IMPRESSION: Increasing consolidation in the left base with associated small effusion. Electronically Signed   By: Inez Catalina M.D.   On: 07/01/2020 12:36   DG Chest Portable 1 View  Result Date: 07/11/2020 CLINICAL DATA:  Weakness, failure to thrive EXAM: PORTABLE CHEST 1 VIEW COMPARISON:  Chest radiograph June 25, 2020 FINDINGS: Unchanged position of the left approach central venous catheter. Similar cardiomegaly. Similar diffuse bilateral interstitial prominence consistent with interstitial edema, infection or pneumonitis. Decreased opacification base. Decreased small left pleural effusion. The visualized skeletal structures are unchanged. Pneumoperitoneum again visualized. IMPRESSION: 1. Similar diffuse bilateral interstitial prominence consistent with interstitial edema, infection or pneumonitis. Decreased small left pleural effusion and left basilar opacification, likely atelectasis. 2. Pneumoperitoneum again visualized. Electronically Signed   By: Dahlia Bailiff MD   On: 07/17/2020 17:29   DG [place] Dys Tube Plc W/Fl W/Rad  Result Date: 06/30/2020 CLINICAL DATA:  NG tube placement EXAM: NASO G TUBE PLACEMENT WITH FL AND WITH RAD FLUOROSCOPY TIME:  Fluoroscopy Time:  12 seconds Radiation Exposure Index (if provided by the fluoroscopic device): 1.8 mGy Number of Acquired Spot Images: 0 COMPARISON:  None. FINDINGS: Nasogastric tube inserted through the right nostril and advanced into the stomach without difficulty. Fluoroscopic spot image  demonstrates satisfactory position of the nasogastric tube in the stomach. Nasogastric tube was secured to the nose. IMPRESSION: Successful placement of a nasogastric tube with the tip in the stomach. Electronically Signed   By: Kathreen Devoid   On: 06/30/2020 13:01     Assessment and Plan:   1. NSTEMI -  Heparin drip x48 hours, PTA Lipitor -Trend troponins until peak -Repeat echocardiogram -Okay to transfer patient to telemetry floor if bed available  2.  HFrEF, last EF 40 to 45% -Appears euvolemic -Coreg 6.25 twice daily -Start hydralazine 25 mg 3 times daily -Volume management with dialysis.  3.  End-stage renal disease -Dialysis as per renal  4.  Failure to thrive, pneumonia, weakness -Tube feeds, management per primary team  I do not anticipate any invasive cardiac procedures being done for patient due to patient frailty.  Continue medical management for conditions as as above.  Total encounter time 110 minutes  Greater than 50% was spent in counseling and coordination of care with the patient    Signed, Kate Sable, MD  07/01/2020 4:34 PM

## 2020-07-01 NOTE — Progress Notes (Signed)
Rapid Response Consult:  Patient seen by RRT at the request of 1C DD Kennon Holter. Upon exam, patient is awake and alert, non-vocal. Facial grimacing indicates pain. BP was elevated in [id number] systolic, however, PRN hydralazine had been given. VS are otherwise unremarkable. Cardiology is following the case for potential NSTEMI; patient is on heparin gtt. Family members voiced concern over the patient's level of care, however, patient is already pending a transfer to PCU. No acute distress. I spoke with the patient's bedside RN and suggested APAP for pain control. The patient's family was not receptive to allowing the patient's nurse to administer PRN morphine (which was ordered) and this was communicated to the patient's primary RN as well. The patient's family denies other needs at this time. Patient is set to transfer to PCU. Patient's nurse is instructed to initiate RRT if the patient deteriorates or destabilizes.

## 2020-07-01 NOTE — Progress Notes (Addendum)
PROGRESS NOTE    Scott Oconnor  [id number] DOB: [birth year] DOA: 07/08/2020 PCP: [name], MD    Assessment & Plan:   Active Problems:   Failure to thrive in adult  Chest pain: etiology unclear, possible NSTEMI. EKG shows acute changes from day prior. Elevated troponin x 1 and repeat troponins ordered. Continue on tele. Cardio consulted.   Severe sepsis: secondary to COVID-19 & bacterial pneumonia. Continue on remdesivir, IV steroids, & IV zosyn. Encourage incentive spirometry   Recent GI bleed: eliquis has been d/c. GI saw the patient and did not recommend any intervention at that time. Will continue to monitor H&H  Metabolic encephalopathy: slowly improving, likely in setting of COVID-19 pneumonia, also had uremia during previous admission. CT brain shows no acute intracranial findings  ESRD: on PD at home. Will get HD while inpatient. Nephro following and recs apprec  Failure to thrive: w/ poor p.o. intake. Has been declining since 20th January, as per daughter. Continue w/ tube feeds via NG tube   Thrombocytopenia: etiology unclear, possibly secondary to [place]. Will continue to monitor  ACD: likely secondary to ESRD. No need for a transfusion currently     DVT prophylaxis: SCDs Code Status: full  Family Communication: discussed pt's care w/ pt's daughter at bedside and answered her questions  Disposition Plan: likely d/c back home .  Level of care: Med-Surg   Status is: Inpatient  Remains inpatient appropriate because:Altered mental status, IV treatments appropriate due to intensity of illness or inability to take PO and Inpatient level of care appropriate due to severity of illness   Dispo: The patient is from: Home              Anticipated d/c is to: Home              Anticipated d/c date is: > 3 days              Patient currently is not medically stable to d/c.   Difficult to place patient No     Consultants:   nephro    Procedures:     Antimicrobials: zosyn    Subjective: Pt c/o pain   Objective: Vitals:   06/30/20 1927 06/30/20 2346 07/01/20 0414 07/01/20 0600  BP: (!) 166/93 (!) 173/103 (!) 170/114 (!) 153/101  Pulse: 97 (!) 106 (!) 108 95  Resp: '17 20 20 18  '$ Temp: 98.2 F (36.8 C) 97.9 F (36.6 C) 98 F (36.7 C) 97.9 F (36.6 C)  TempSrc: Axillary Axillary Axillary Axillary  SpO2: 100% 100% 100% 100%  Weight:      Height:        Intake/Output Summary (Last 24 hours) at 07/01/2020 0756 Last data filed at 07/01/2020 0436 Gross per 24 hour  Intake 539.33 ml  Output -277 ml  Net 816.33 ml   Filed Weights   07/18/2020 1636 06/29/20 0500 06/30/20 0500  Weight: 55 kg 55 kg 55.4 kg    Examination:  General exam: Appears uncomfortable Respiratory system: diminished breath sounds b/l  Cardiovascular system: S1 & S2 +. No rubs, gallops or clicks. No pedal edema. Gastrointestinal system: Abdomen is nondistended, soft and nontender. Normal bowel sounds heard. Central nervous system: Alert and awake. Moves all extremities  Psychiatry: Judgement and insight appear abnormal. Flat mood and affect.     Data Reviewed: I have personally reviewed following labs and imaging studies  CBC: Recent Labs  Lab 06/24/20 2159 06/25/20 0759 06/27/20 0618 07/06/2020 1725 06/29/20 0213 06/30/20 0515 07/01/20  0515  WBC 9.9   < > 7.8 10.8* 11.1* 6.5 5.3  NEUTROABS 8.7*  --   --  9.1* 9.2*  --   --   HGB 8.0*   < > 7.2* 10.7* 10.7* 10.6* 10.5*  HCT 23.8*   < > 21.6* 31.4* 31.9* 31.8* 31.0*  MCV 99.2   < > 97.3 95.7 96.4 96.7 95.1  PLT 100*   < > 133* 128* 134* 112* 124*   < > = values in this interval not displayed.   Basic Metabolic Panel: Recent Labs  Lab 06/26/20 0536 06/27/20 1051 07/16/2020 1920 06/29/20 0213 06/29/20 0517 06/30/20 0515 06/30/20 1035 06/30/20 1702 07/01/20 0515  NA 134*  --  136 135  --  138  --   --  137  K 4.9  --  5.2* 4.4  --  4.8  --   --  4.0  CL 93*  --  95* 95*  --  97*  --    --  96*  CO2 23  --  24 18*  --  18*  --   --  21*  GLUCOSE 168*  --  100* 123*  --  135*  --   --  197*  BUN 48*  --  64* 71*  --  97*  --   --  62*  CREATININE 6.39*  --  6.84* 7.49*  --  9.26*  --   --  6.03*  CALCIUM 8.1*  --  7.0* 7.1*  --  7.7*  --   --  7.8*  MG  --   --   --   --  2.4 2.5*  --  2.2 2.2  PHOS  --    < >  --   --  9.0* 12.3* 12.7* 6.9* 8.8*   < > = values in this interval not displayed.   GFR: Estimated Creatinine Clearance: 8.5 mL/min (A) (by C-G formula based on SCr of 6.03 mg/dL (H)). Liver Function Tests: Recent Labs  Lab 06/24/20 2159 06/29/20 0213 06/30/20 0515 07/01/20 0515  AST 62* 44*  44* 33 39  ALT '10 16  15 14 15  '$ ALKPHOS 44 49  51 42 47  BILITOT 1.1 1.2  1.2 1.1 1.0  PROT 5.9* 6.0*  6.1* 5.3* 5.5*  ALBUMIN 2.6* 2.7*  2.7* 2.3* 2.5*   Recent Labs  Lab 06/29/20 0213  LIPASE 142*   Recent Labs  Lab 06/29/20 0517  AMMONIA 14   Coagulation Profile: Recent Labs  Lab 06/24/20 2159 06/29/20 0213  INR 1.1 1.2   Cardiac Enzymes: Recent Labs  Lab 07/13/2020 1725  CKTOTAL 341   BNP (last 3 results) No results for input(s): PROBNP in the last 8760 hours. HbA1C: No results for input(s): HGBA1C in the last 72 hours. CBG: Recent Labs  Lab 06/30/20 1621 06/30/20 1959 06/30/20 2351 07/01/20 0419 07/01/20 0750  GLUCAP 106* 91 162* 203* 176*   Lipid Profile: No results for input(s): CHOL, HDL, LDLCALC, TRIG, CHOLHDL, LDLDIRECT in the last 72 hours. Thyroid Function Tests: Recent Labs    06/29/20 0213  TSH 7.220*   Anemia Panel: No results for input(s): VITAMINB12, FOLATE, FERRITIN, TIBC, IRON, RETICCTPCT in the last 72 hours. Sepsis Labs: Recent Labs  Lab 06/24/20 2159 06/24/20 2201 06/25/20 0043 06/25/20 0759 07/02/2020 1725 06/29/20 0213 06/29/20 0517  PROCALCITON 3.70  --   --  8.03  --   --   --   LATICACIDVEN  --    < >  1.2  --  1.5 2.7* 1.8   < > = values in this interval not displayed.    Recent Results  (from the past 240 hour(s))  Culture, blood (x 2)     Status: None   Collection Time: 06/24/20 10:00 PM   Specimen: BLOOD  Result Value Ref Range Status   Specimen Description BLOOD BLOOD LEFT HAND  Final   Special Requests   Final    BOTTLES DRAWN AEROBIC ONLY Blood Culture results may not be optimal due to an inadequate volume of blood received in culture bottles   Culture   Final    NO GROWTH 5 DAYS Performed at [facility], [address] [place], [place] [address]    Report Status 06/29/2020 FINAL  Final  Culture, blood (x 2)     Status: None   Collection Time: 06/24/20 10:01 PM   Specimen: BLOOD  Result Value Ref Range Status   Specimen Description BLOOD BLOOD RIGHT HAND  Final   Special Requests   Final    BOTTLES DRAWN AEROBIC ONLY Blood Culture results may not be optimal due to an inadequate volume of blood received in culture bottles   Culture   Final    NO GROWTH 5 DAYS Performed at [facility], [address] [place], [place] [address]    Report Status 06/29/2020 FINAL  Final  MRSA PCR Screening     Status: None   Collection Time: 06/25/20  6:27 PM   Specimen: Nasopharyngeal  Result Value Ref Range Status   MRSA by PCR NEGATIVE NEGATIVE Final    Comment:        The GeneXpert MRSA Assay (FDA approved for NASAL specimens only), is one component of a comprehensive MRSA colonization surveillance program. It is not intended to diagnose MRSA infection nor to guide or monitor treatment for MRSA infections. Performed at [facility], [address] [place], [place] [address]   Blood culture (routine x 2)     Status: None (Preliminary result)   Collection Time: 06/27/2020  5:26 PM   Specimen: BLOOD  Result Value Ref Range Status   Specimen Description BLOOD RIGHT ANTECUBITAL  Final   Special Requests   Final    BOTTLES DRAWN AEROBIC AND ANAEROBIC Blood Culture results may not be optimal due to an inadequate volume of blood received  in culture bottles   Culture   Final    NO GROWTH 3 DAYS Performed at [facility], [address] [place], [place] [address]    Report Status PENDING  Incomplete  Blood culture (routine x 2)     Status: None (Preliminary result)   Collection Time: 06/24/2020  5:26 PM   Specimen: BLOOD  Result Value Ref Range Status   Specimen Description BLOOD LEFT ANTECUBITAL  Final   Special Requests   Final    BOTTLES DRAWN AEROBIC AND ANAEROBIC Blood Culture results may not be optimal due to an inadequate volume of blood received in culture bottles   Culture   Final    NO GROWTH 3 DAYS Performed at [facility], [address], [place],  [address]    Report Status PENDING  Incomplete  C Difficile Quick Screen w PCR reflex     Status: None   Collection Time: 06/30/20  5:08 PM   Specimen: STOOL  Result Value Ref Range Status   C Diff antigen NEGATIVE NEGATIVE Final   C Diff toxin NEGATIVE NEGATIVE Final   C  Diff interpretation No C. difficile detected.  Final    Comment: Performed at [facility], [address], [place], [place] [address]         Radiology Studies: DG Abd 1 View  Result Date: 06/29/2020 CLINICAL DATA:  75 year old male status post NG placement. EXAM: ABDOMEN - 1 VIEW COMPARISON:  Chest radiograph dated 06/30/2020. FINDINGS: Partially visualized tube curved in the lower neck above the thoracic inlet. Recommend retraction and repositioning. Dialysis catheter in similar position. Small left pleural effusion and left lung base atelectasis or infiltrate. No pneumothorax. Stable cardiac silhouette. Atherosclerotic calcification of the aorta. No acute osseous pathology. Partially visualized endovascular stent graft of the distal abdominal aorta. IMPRESSION: 1. Partially visualized tube curved in the lower neck above the thoracic inlet. Recommend retraction and repositioning. 2. Small left pleural effusion and left lung base atelectasis or infiltrate,  increased since the prior radiograph. Electronically Signed   By: Anner Crete M.D.   On: 06/29/2020 16:59   DG Naso G Tube Plc W/Fl W/Rad  Result Date: 06/30/2020 CLINICAL DATA:  NG tube placement EXAM: NASO G TUBE PLACEMENT WITH FL AND WITH RAD FLUOROSCOPY TIME:  Fluoroscopy Time:  12 seconds Radiation Exposure Index (if provided by the fluoroscopic device): 1.8 mGy Number of Acquired Spot Images: 0 COMPARISON:  None. FINDINGS: Nasogastric tube inserted through the right nostril and advanced into the stomach without difficulty. Fluoroscopic spot image demonstrates satisfactory position of the nasogastric tube in the stomach. Nasogastric tube was secured to the nose. IMPRESSION: Successful placement of a nasogastric tube with the tip in the stomach. Electronically Signed   By: Kathreen Devoid   On: 06/30/2020 13:01        Scheduled Meds: . benzonatate  100 mg Oral Q8H  . carvedilol  3.125 mg Per Tube BID WC  . Chlorhexidine Gluconate Cloth  6 each Topical Daily  . feeding supplement (PROSource TF)  45 mL Per Tube TID  . LORazepam  1 mg Intravenous Once  . methylPREDNISolone (SOLU-MEDROL) injection  16 mg Intravenous Daily  . multivitamin  1 tablet Per Tube QHS  . pantoprazole (PROTONIX) IV  40 mg Intravenous Q12H  . sodium chloride flush  3 mL Intravenous Q12H   Continuous Infusions: . albumin human    . feeding supplement (NEPRO CARB STEADY) 45 mL/hr at 07/01/20 0233  . piperacillin-tazobactam (ZOSYN)  IV 2.25 g (07/01/20 0238)  . remdesivir 100 mg in NS 100 mL Stopped (07/01/20 0117)     LOS: 3 days    Time spent: 32 mins    Wyvonnia Dusky, MD [facility] Pager [phone]  If 7PM-7AM, please contact night-coverage 07/01/2020, 7:56 AM

## 2020-07-01 NOTE — Progress Notes (Signed)
[place], [place] 06/27/20  Subjective:   [name] was admitted to [facility] on 06/23/2020 for weakness and poor appetite. Per daughter, the he also complains of body aches and nausea.  Covid -19 +  Patient resting in bed with eyes closed. Daughter at bedside. He is able to open eyes with name called. Able to answer yes/no questions. Responds yes when asked if he is in pain. He replies yes to throat and belly pain.  NGT in place and he is receiving tube feeds. He was able to nod appropriately when asked orientation questions. Daughter feels he's more alert today.   Objective:  Vital signs in last 24 hours:  Temp:  [97.6 F (36.4 C)-99.1 F (37.3 C)] 97.8 F (36.6 C) (02/05 1245) Pulse Rate:  [54-79] 79 (02/05 1245) Resp:  [12-26] 22 (02/05 1245) BP: (101-168)/(66-87) 157/76 (02/05 1245) SpO2:  [91 %-100 %] 91 % (02/05 1245)  Weight change:  Filed Weights   06/20/20 0519 06/22/20 0458 06/26/20 0500  Weight: 55.1 kg 55.2 kg 54.9 kg    Intake/Output:    Intake/Output Summary (Last 24 hours) at 06/27/2020 1356 Last data filed at 06/27/2020 1300 Gross per 24 hour  Intake 348 ml  Output 0 ml  Net 348 ml     Physical Exam: General:  Resting in bed, chronically ill-appearing  HEENT normocephalic, dry oral mucosa  Pulm/lungs Clear bilaterally, respirations even and unlabored  CVS/Heart  S1-S2 present, no chest pain  Abdomen:   soft, nontender  Extremities: No peripheral edema  Neurologic:  Arousable, responds to few yes/no questions  Skin:  No lesions or rashes  Access:  PD cath ,Left IJ Permcath       Basic Metabolic Panel:  Recent Labs  Lab 06/21/20 0416 06/22/20 0510 06/23/20 0559 06/24/20 [id number] 06/24/20 2159 06/25/20 0759 06/26/20 0536 06/27/20 1051  NA 139   < > 143 140 136 137 134*  --   K 4.8   < > 4.5 4.3 3.8 4.3 4.9  --   CL 98   < > 97* 98 95* 95* 93*  --   CO2 19*   < > 22 20* 21* 22 23  --   GLUCOSE 116*   < >  132* 118* 87 83 168*  --   BUN 128*   < > 116* 109* 51* 60* 48*  --   CREATININE 15.49*   < > 15.88* 16.30* 8.46* 9.52* 6.39*  --   CALCIUM 7.6*   < > 7.7* 7.1* 7.6* 7.6* 8.1*  --   PHOS 9.5*  --  9.1* 10.0*  --   --   --  6.4*   < > = values in this interval not displayed.     CBC: Recent Labs  Lab 06/24/20 0619 06/24/20 2159 06/25/20 0759 06/26/20 0536 06/27/20 0618  WBC 9.2 9.9 9.8 5.5 7.8  NEUTROABS  --  8.7*  --   --   --   HGB 6.9* 8.0* 8.1* 7.7* 7.2*  HCT 20.6* 23.8* 24.0* 23.1* 21.6*  MCV 97.6 99.2 98.0 97.9 97.3  PLT 98* 100* 112* 125* 133*      Lab Results  Component Value Date   HEPBSAG NON REACTIVE 05/06/2020   HEPBSAB NON REACTIVE 09/20/2019   HEPBIGM NON REACTIVE 09/20/2019      Microbiology:  Recent Results (from the past 240 hour(s))  Culture, blood (x 2)     Status: None (Preliminary result)   Collection Time: 06/24/20  10:00 PM   Specimen: BLOOD  Result Value Ref Range Status   Specimen Description BLOOD BLOOD LEFT HAND  Final   Special Requests   Final    BOTTLES DRAWN AEROBIC ONLY Blood Culture results may not be optimal due to an inadequate volume of blood received in culture bottles   Culture   Final    NO GROWTH 3 DAYS Performed at [facility], [address] [place], [place] [address]    Report Status PENDING  Incomplete  Culture, blood (x 2)     Status: None (Preliminary result)   Collection Time: 06/24/20 10:01 PM   Specimen: BLOOD  Result Value Ref Range Status   Specimen Description BLOOD BLOOD RIGHT HAND  Final   Special Requests   Final    BOTTLES DRAWN AEROBIC ONLY Blood Culture results may not be optimal due to an inadequate volume of blood received in culture bottles   Culture   Final    NO GROWTH 3 DAYS Performed at [facility], [address] [place], [place] [address]    Report Status PENDING  Incomplete  MRSA PCR Screening     Status: None   Collection Time: 06/25/20  6:27 PM   Specimen:  Nasopharyngeal  Result Value Ref Range Status   MRSA by PCR NEGATIVE NEGATIVE Final    Comment:        The GeneXpert MRSA Assay (FDA approved for NASAL specimens only), is one component of a comprehensive MRSA colonization surveillance program. It is not intended to diagnose MRSA infection nor to guide or monitor treatment for MRSA infections. Performed at [facility], [address] [place], [place] [address]     Coagulation Studies: Recent Labs    06/24/20 2157/08/14  LABPROT 14.2  INR 1.1    Urinalysis: No results for input(s): COLORURINE, LABSPEC, PHURINE, GLUCOSEU, HGBUR, BILIRUBINUR, KETONESUR, PROTEINUR, UROBILINOGEN, NITRITE, LEUKOCYTESUR in the last 72 hours.  Invalid input(s): APPERANCEUR    Imaging: No results found.   Medications:   . sodium chloride 50 mL/hr at 06/26/20 0030  . acetaminophen 1,000 mg (06/27/20 0600)  . dialysis solution 1.5% low-MG/low-CA    . piperacillin-tazobactam (ZOSYN)  IV 2.25 g (06/27/20 0858)   . sodium chloride   Intravenous Once  . sodium chloride   Intravenous Once  . atorvastatin  40 mg Oral Daily  . benzonatate  100 mg Oral Q8H  . carvedilol  3.125 mg Oral BID WC  . Chlorhexidine Gluconate Cloth  6 each Topical Daily  . epoetin (EPOGEN/PROCRIT) injection  20,000 Units Subcutaneous Weekly  . gentamicin cream  1 application Topical Daily  . guaiFENesin  600 mg Oral BID  . methylPREDNISolone (SOLU-MEDROL) injection  40 mg Intravenous Daily  . multivitamin  1 tablet Oral QHS  . pantoprazole  40 mg Intravenous Q12H  . polyethylene glycol  17 g Oral Daily   acetaminophen **OR** acetaminophen **OR** acetaminophen, calcium carbonate, guaiFENesin-dextromethorphan, lidocaine, loperamide, ondansetron (ZOFRAN) IV, ondansetron (ZOFRAN) IV  Assessment/ Plan:  75 y.o. male with  end stage renal disease on peritoneal dialysis, hypertension, AAA repair, congestive heart failure, peripheral vascular disease, raynaud's  syndrome, hyperlipidemia  was admitted on 06/18/2020 for  Active Problems:   Essential hypertension   HFrEF (heart failure with reduced ejection fraction) (HCC)   End stage renal disease on dialysis (HCC)   Acute blood loss anemia   [id number]   Toxic metabolic encephalopathy   Lobar pneumonia (HCC)   Sepsis with encephalopathy without septic  shock ([place])  Acute GI bleeding [K92.2] Generalized weakness [R53.1] Gastrointestinal hemorrhage, unspecified gastrointestinal hemorrhage type [K92.2] COVID-19 [U07.1]   [facility] 52.5kg CCPD 9 hours 5 exchanges 2032m fills  #. ESRD Currently receives hemodialysis while in hospital.  During his treatment yesterday, there was increased HR and BP Treatment yesterday resulted in [id number]  Continues to have elevated BP and HR. Next scheduled treatment will be tomorrow.  Plan to resume PD when he is discharged from hospital.  #. Failure to thrive Likely due to declining health Currently receiving tube feeds at [id number] Seems to be tolerating at this time   #. Anemia of CKD Maintaining a low, but stable HGB.  He continues to receive epogen 20,000 units weekly  # Secondary hyperparathyroidism of renal origin N 25.81   No results found for: PTH Will monitor levels during hospital stay.   #Covid -19 positive pneumonia Tested positive on June 18, 2020 No complaints of shortness of breath.  Primary team will manage

## 2020-07-02 ENCOUNTER — Inpatient Hospital Stay (HOSPITAL_COMMUNITY)
Admit: 2020-07-02 | Discharge: 2020-07-02 | Disposition: A | Discharge status: Home / Self Care | Payer: Medicare Other | Attending: Cardiology | Admitting: Cardiology

## 2020-07-02 ENCOUNTER — Inpatient Hospital Stay: Payer: Medicare Other

## 2020-07-02 DIAGNOSIS — I214 Non-ST elevation (NSTEMI) myocardial infarction: Secondary | ICD-10-CM

## 2020-07-02 DIAGNOSIS — R627 Adult failure to thrive: Secondary | ICD-10-CM | POA: Diagnosis not present

## 2020-07-02 DIAGNOSIS — U071 COVID-19: Secondary | ICD-10-CM | POA: Diagnosis not present

## 2020-07-02 DIAGNOSIS — I420 Dilated cardiomyopathy: Secondary | ICD-10-CM

## 2020-07-02 DIAGNOSIS — J1282 Pneumonia due to coronavirus disease 2019: Secondary | ICD-10-CM | POA: Diagnosis not present

## 2020-07-02 DIAGNOSIS — R630 Anorexia: Secondary | ICD-10-CM | POA: Diagnosis not present

## 2020-07-02 DIAGNOSIS — N186 End stage renal disease: Secondary | ICD-10-CM | POA: Diagnosis not present

## 2020-07-02 LAB — BASIC METABOLIC PANEL
Anion gap: 22 — ABNORMAL HIGH (ref 5–15)
BUN: 105 mg/dL — ABNORMAL HIGH (ref 8–23)
CO2: 20 mmol/L — ABNORMAL LOW (ref 22–32)
Calcium: 7.1 mg/dL — ABNORMAL LOW (ref 8.9–10.3)
Chloride: 98 mmol/L (ref 98–111)
Creatinine, Ser: 8.66 mg/dL — ABNORMAL HIGH (ref 0.61–1.24)
GFR, Estimated: 6 mL/min — ABNORMAL LOW (ref >60)
Glucose, Bld: 228 mg/dL — ABNORMAL HIGH (ref 70–99)
Potassium: 4 mmol/L (ref 3.5–5.1)
Sodium: 140 mmol/L (ref 135–145)

## 2020-07-02 LAB — ECHOCARDIOGRAM COMPLETE
AR max vel: 2.72 cm2
AV Area VTI: 2.54 cm2
AV Area mean vel: 2.64 cm2
AV Mean grad: 1 mmHg
AV Peak grad: 2.8 mmHg
Ao pk vel: 0.84 m/s
Area-P 1/2: 5.61 cm2
Calc EF: 32.9 %
Height: 70 in
MV VTI: 1.8 cm2
S' Lateral: 4 cm
Single Plane A2C EF: 38.2 %
Single Plane A4C EF: 35.8 %
Weight: 1954.16 oz

## 2020-07-02 LAB — BLOOD GAS, ARTERIAL
Acid-base deficit: 5.1 mmol/L — ABNORMAL HIGH (ref 0.0–2.0)
Bicarbonate: 19.8 mmol/L — ABNORMAL LOW (ref 20.0–28.0)
FIO2: 0.28
O2 Saturation: 97.5 %
Patient temperature: 37
pCO2 arterial: 35 mmHg (ref 32.0–48.0)
pH, Arterial: 7.36 (ref 7.350–7.450)
pO2, Arterial: 100 mmHg (ref 83.0–108.0)

## 2020-07-02 LAB — HEMOGLOBIN AND HEMATOCRIT, BLOOD
HCT: 23.4 % — ABNORMAL LOW (ref 39.0–52.0)
Hemoglobin: 7.9 g/dL — ABNORMAL LOW (ref 13.0–17.0)

## 2020-07-02 LAB — CBC
HCT: 26.3 % — ABNORMAL LOW (ref 39.0–52.0)
Hemoglobin: 8.8 g/dL — ABNORMAL LOW (ref 13.0–17.0)
MCH: 32.6 pg (ref 26.0–34.0)
MCHC: 33.5 g/dL (ref 30.0–36.0)
MCV: 97.4 fL (ref 80.0–100.0)
Platelets: 134 10*3/uL — ABNORMAL LOW (ref 150–400)
RBC: 2.7 MIL/uL — ABNORMAL LOW (ref 4.22–5.81)
RDW: 17.8 % — ABNORMAL HIGH (ref 11.5–15.5)
WBC: 9.1 10*3/uL (ref 4.0–10.5)
nRBC: 4.1 % — ABNORMAL HIGH (ref 0.0–0.2)

## 2020-07-02 LAB — HEPARIN LEVEL (UNFRACTIONATED): Heparin Unfractionated: 0.31 IU/mL (ref 0.30–0.70)

## 2020-07-02 LAB — LACTIC ACID, PLASMA: Lactic Acid, Venous: 2 mmol/L (ref 0.5–1.9)

## 2020-07-02 LAB — PHOSPHORUS: Phosphorus: 10.8 mg/dL — ABNORMAL HIGH (ref 2.5–4.6)

## 2020-07-02 LAB — GLUCOSE, CAPILLARY
Glucose-Capillary: 192 mg/dL — ABNORMAL HIGH (ref 70–99)
Glucose-Capillary: 243 mg/dL — ABNORMAL HIGH (ref 70–99)
Glucose-Capillary: 261 mg/dL — ABNORMAL HIGH (ref 70–99)
Glucose-Capillary: 281 mg/dL — ABNORMAL HIGH (ref 70–99)

## 2020-07-02 LAB — TROPONIN I (HIGH SENSITIVITY): Troponin I (High Sensitivity): 899 ng/L (ref <18)

## 2020-07-02 LAB — HEMOGLOBIN: Hemoglobin: 8.4 g/dL — ABNORMAL LOW (ref 13.0–17.0)

## 2020-07-02 LAB — OCCULT BLOOD X 1 CARD TO LAB, STOOL: Fecal Occult Bld: POSITIVE — AB

## 2020-07-02 MED ORDER — ISOSORBIDE DINITRATE 20 MG PO TABS
20.0000 mg | ORAL_TABLET | Freq: Three times a day (TID) | ORAL | Status: DC
  Administered 2020-07-02 – 2020-07-04 (×8): 20 mg via ORAL
  Filled 2020-07-02 (×9): qty 1

## 2020-07-02 MED ORDER — HYDRALAZINE HCL 50 MG PO TABS
50.0000 mg | ORAL_TABLET | Freq: Three times a day (TID) | ORAL | Status: DC
  Administered 2020-07-02 – 2020-07-04 (×8): 50 mg via ORAL
  Filled 2020-07-02 (×8): qty 1

## 2020-07-02 MED ORDER — LOPERAMIDE HCL 1 MG/7.5ML PO SUSP
4.0000 mg | ORAL | Status: DC | PRN
  Administered 2020-07-02 (×3): 4 mg
  Filled 2020-07-02 (×6): qty 30

## 2020-07-02 MED ORDER — DELFLEX-LC/1.5% DEXTROSE 344 MOSM/L IP SOLN
INTRAPERITONEAL | Status: DC
  Filled 2020-07-02 (×3): qty 3000

## 2020-07-02 MED ORDER — FREE WATER
30.0000 mL | Status: DC
  Administered 2020-07-02 – 2020-07-04 (×12): 30 mL

## 2020-07-02 MED ORDER — NUTRISOURCE FIBER PO PACK
1.0000 | PACK | Freq: Three times a day (TID) | ORAL | Status: DC
  Administered 2020-07-03 (×2): 1
  Filled 2020-07-02 (×9): qty 1

## 2020-07-02 MED ORDER — GERHARDT'S BUTT CREAM
TOPICAL_CREAM | Freq: Three times a day (TID) | CUTANEOUS | Status: DC
  Filled 2020-07-02: qty 1

## 2020-07-02 MED ORDER — GENTAMICIN SULFATE 0.1 % EX CREA
1.0000 "application " | TOPICAL_CREAM | Freq: Every day | CUTANEOUS | Status: DC
  Administered 2020-07-04: 1 via TOPICAL
  Filled 2020-07-02: qty 15

## 2020-07-02 NOTE — Consult Note (Signed)
NAME:  Scott Oconnor, MRN:  [id number], DOB:  1946/03/09, LOS: 4 ADMISSION DATE:  06/30/2020, CONSULTATION DATE:  07/02/2020 REFERRING MD:  Dr. Holley Raring, CHIEF COMPLAINT:  Acute Hypoxic Respiratory Failure   Brief History:  75 y.o. Male with a PMH significant for ESRD on HD and HFrEF admitted with Sepsis and Acute Hypoxic Respiratory Failure in the setting of COVID-19 Pneumonia and superimposed bacterial pneumonia, along with NSTEMI and failure to thrive.  History of Present Illness:  75 year old male with medical history of ESRD on hemodialysis, who was started on peritoneal dialysis at home recently after being switched from hemodialysis, anemia of chronic kidney disease, hypertension, CHF, peripheral vascular disease, hyperlipidemia, Raynaud's syndrome who was recently admitted on 06/18/2020 for acute GI bleed and Covid pneumonia.  Hospital course was complicated by bacterial pneumonia, sepsis, uremia and metabolic encephalopathy.  He was treated with Solu-Medrol with plans for continued taper at home for COVID-19 pneumonia.  He was not given remdesivir due to family preference.  He was treated with Zosyn with plan to complete course with Augmentin at home for recent diagnosis of lumbar pneumonia.  GI has seen the patient and recommended conservative management.  He was given 3 units PRBC during his hospitalization. Patient was discharged home on 06/27/2020.  His family reported was extremely week with poor PO intake since time of discharge, along with fever.  He returned to ED the following day on 07/19/2020 and was admitted by the Hospitalist for further workup and treatment of Sepsis and Acute Hypoxic Respiratory Failure in the setting of COVID-19 Pneumonia and superimposed bacterial pneumonia.   On 07/01/20 he developed chest pain, and ruled in for NSTEMI of which he was placed on Heparin drip and was seen by Cardiology.  On 07/02/20 he is exhibiting increased work of breathing.  He is at high risk for  decompensation and cardiac arrest.  PCCM was consulted.  After discussion with family, family elected to make him a DNR/DNI.  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 No acute finding by CT. Chronic small-vessel ischemic changes of the white matter. 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 2/7: CXR>>Partially visualized tube curved in the lower neck above the thoracic inlet. Recommend retraction and repositioning. Dialysis catheter in similar position. Small left pleural effusion and left lung base atelectasis or infiltrate. No pneumothorax. Stable cardiac silhouette. Atherosclerotic calcification of the aorta. No acute osseous pathology. Partially visualized endovascular stent graft of the distal abdominal aorta. 2/10: Venous US RUE>> 2/10: Echocardiogram>>  Micro Data:  1/27: SARS-CoV-2 Ag>>Positive 2/2: Blood culture x2>> no growth 2/3: MRSA PCR>>negative 2/5: Hepatitis B Surface Ag>>nonreactive 2/6: Blood culture x2>> 2/8: C-difficile>>negative  Antimicrobials:  Zosyn 2/6>>  Interim History / Subjective:  PCCM consulted due to increased work of breathing; high risk for decompensation and need for intubation Dr. Mortimer Fries met with pt's family~ made DNR/DNI Pt is very lethargic and encephalopathy, unable to contribute to ROS Afebrile, hypertension with mild tachypnea, O2 sats 98% on 2L [place]   Objective   Blood pressure (!) 180/107, pulse (!) 101, temperature 98.4 F (36.9 C), temperature source Oral, resp. rate 17, height '5\' 10"'  (1.778 m), weight  55.4 kg, SpO2  100 %.        Intake/Output Summary (Last 24 hours) at 07/02/2020 1150 Last data filed at 07/01/2020 2200 Gross per 24 hour  Intake 660 ml  Output --  Net 660 ml   Filed Weights   06/23/2020 1636 06/29/20 0500 06/30/20 0500  Weight: 55 kg 55 kg 55.4 kg   PHYSICAL EXAMINATION:  GENERAL:critically ill appearing, +resp distress HEAD: Normocephalic, atraumatic.  EYES: Pupils equal, round, reactive to light.  No scleral icterus.  MOUTH: Moist mucosal membrane. NECK: Supple. No thyromegaly. No nodules. No JVD.  PULMONARY: +rhonchi, +wheezing CARDIOVASCULAR: S1 and S2. Regular rate and rhythm. No murmurs, rubs, or gallops.  GASTROINTESTINAL: +pain MUSCULOSKELETAL: No swelling, clubbing, or edema.  NEUROLOGIC: obtunded SKIN:intact,warm,dry     Resolved Hospital Problem list   N/A  Assessment & Plan:   Acute Hypoxic Respiratory Failure secondary due to COVID-19 Pneumonia and Superimposed Bacterial Pneumonia -Supplemental O2 as needed to maintain O2 sats >88% -Follow intermittent CXR and ABG as needed -Pt made DNR/DNI -Family deferred pt receiving Remdesivir -IV Steroids -Follow inflammatory markers: Ferritin, D-dimer, CRP -Maintain euvovolemia to net negative fluid balance; HD for volume removal -Self-proning if able -Incentive spirometry and flutter valve as able -Vitamin C, zinc -Maintain airborne & contact isolation   Sepsis in the setting of COVID-19 Pneumonia and Superimposed Bacterial Pneumonia -Monitor fever curve -Trend WBC's and Procalcitonin -Follow cultures as above -Continue Zosyn for now pending culture and sensitivities   NSTEMI Hypertension Chronic HFrEF without acute exacerbation -Continuous cardiac monitoring -Maintain MAP >65 -Heparin gtt -Cardiology following -Coreg and Hydralazine as per Cardiology -Repeat Echo on 2/10 pending   Anemia of Chronic Disease superimposed on Recent GI Bleed -Monitor for S/Sx of bleeding ~ no signs of GI bleeding  to date, GI did not recommend any intervention on last admission -Trend CBC -Heparin gtt for Anticoagulation/VTE Prophylaxis  -Transfuse for Hgb <7   Acute Metabolic Encephalopathy in the setting of Sepsis, COVID-19 Pneumonia and Uremia -Provide supportive care -Promote normal sleep/wake cycle -Avoid sedating meds as able -CT Head this admission negative for acute intracranial findings -Treat underlying processes   Failure to Thrive (daughter reports decline since January 20th, 2022) -Tube feeds via NG tube -Palliative Care consulted   Best practice (evaluated daily)  Diet: NPO, tube feedings Pain/Anxiety/Delirium protocol (if indicated): Morphine VAP protocol (if indicated): N/A DVT prophylaxis: Heparin gtt GI prophylaxis: Protonix Glucose control: N/A Mobility: Bedrest Disposition:Progressive Cardiac  Goals of Care:  Last date of multidisciplinary goals of care discussion:NO Family and staff present: Dr. Mortimer Fries discussed with pt's son and 2 daughters at bedside 07/02/20 Summary of discussion: Poor prognosis, made DNR/DNI Follow up goals of care discussion due: 07/03/2020 Code Status: DNR/DNI  Labs   CBC: Recent Labs  Lab 06/25/2020 1725 06/29/20 0213 06/30/20 0515 07/01/20 0515 07/02/20 0648  WBC 10.8* 11.1* 6.5 5.3 9.1  NEUTROABS 9.1* 9.2*  --   --   --   HGB 10.7* 10.7* 10.6* 10.5* 8.8*  HCT 31.4* 31.9* 31.8* 31.0* 26.3*  MCV 95.7 96.4 96.7 95.1 97.4  PLT 128* 134* 112* 124* 134*    Basic Metabolic Panel: Recent Labs  Lab 07/14/2020 1920 06/29/20 0213 06/29/20 0517 06/30/20 0515 06/30/20 1035 06/30/20 1702 07/01/20 0515 07/02/20 0648  NA 136 135  --  138  --   --  137 140  K 5.2* 4.4  --  4.8  --   --  4.0 4.0  CL 95* 95*  --  97*  --   --  96* 98  CO2 24 18*  --  18*  --   --  21* 20*  GLUCOSE 100* 123*  --  135*  --   --  197* 228*  BUN 64* 71*  --  97*  --   --  62* 105*  CREATININE 6.84* 7.49*  --  9.26*  --   --  6.03* 8.66*  CALCIUM 7.0* 7.1*   --  7.7*  --   --  7.8* 7.1*  MG  --   --  2.4 2.5*  --  2.2 2.2  --   PHOS  --   --  9.0* 12.3* 12.7* 6.9* 8.8*  --    GFR: Estimated Creatinine Clearance: 6 mL/min (A) (by C-G formula based on SCr of 8.66 mg/dL (H)). Recent Labs  Lab 07/03/2020 1725 06/29/20 0213 06/29/20 0517 06/30/20 0515 07/01/20 0515 07/02/20 0648 07/02/20 1019  WBC 10.8* 11.1*  --  6.5 5.3 9.1  --   LATICACIDVEN 1.5 2.7* 1.8  --   --   --  2.0*    Liver Function Tests: Recent Labs  Lab 06/29/20 0213 06/30/20 0515 07/01/20 0515  AST 44*  44* 33 39  ALT '16  15 14 15  ' ALKPHOS 49  51 42 47  BILITOT 1.2  1.2 1.1 1.0  PROT 6.0*  6.1* 5.3* 5.5*  ALBUMIN 2.7*  2.7* 2.3* 2.5*   Recent Labs  Lab 06/29/20 0213  LIPASE 142*   Recent Labs  Lab 06/29/20 0517  AMMONIA 14    ABG    Component Value Date/Time   PHART 7.36 07/02/2020 0954   PCO2ART 35 07/02/2020 0954   PO2ART 100 07/02/2020 0954   HCO3 19.8 (L) 07/02/2020 0954   ACIDBASEDEF 5.1 (H) 07/02/2020 0954   O2SAT 97.5 07/02/2020 0954     Coagulation Profile: Recent Labs  Lab 06/29/20 0213 07/01/20 1518  INR 1.2 1.5*    Cardiac Enzymes: Recent Labs  Lab 07/04/2020 1725  CKTOTAL 341    HbA1C: Hemoglobin A1C  Date/Time Value Ref Range Status  09/19/2019 11:00 AM 6.2 (A) 4.0 - 5.6 % Final   Hgb A1c MFr Bld  Date/Time Value Ref Range Status  11/19/2019 05:24 PM 5.3 4.8 - 5.6 % Final    Comment:    (NOTE)         Prediabetes: 5.7 - 6.4         Diabetes: >6.4         Glycemic control for adults with diabetes: <7.0     CBG: Recent Labs  Lab 07/01/20 0750 07/01/20 1211 07/01/20 1634 07/01/20 2029 07/02/20 0821  GLUCAP 176* 258* 230* 238* 192*    Review of Systems:   Unable to assess due to Encephalopathy and lethargy  Past Medical History:  He,  has a past medical history of Hyperlipidemia (07/2019), Hypertension (07/2019), PVD (peripheral vascular disease) ([place]) (07/2019), Renal disorder (07/2019), and  Reynolds syndrome ([place]) (05/23/2010).   Surgical History:   Past Surgical History:  Procedure Laterality Date  . DIALYSIS/PERMA CATHETER INSERTION N/A 09/26/2019   Procedure: DIALYSIS/PERMA CATHETER INSERTION;  Surgeon: Algernon Huxley, MD;  Location: [facility];  Service: Cardiovascular;  Laterality: N/A;  . DIALYSIS/PERMA CATHETER INSERTION N/A 06/23/2020   Procedure: DIALYSIS/PERMA CATHETER INSERTION;  Surgeon: Algernon Huxley, MD;  Location: [facility];  Service: Cardiovascular;  Laterality: N/A;  . DIALYSIS/PERMA CATHETER REMOVAL N/A 06/19/2020   Procedure: DIALYSIS/PERMA CATHETER  REMOVAL;  Surgeon: Algernon Huxley, MD;  Location: [facility];  Service: Cardiovascular;  Laterality: N/A;  . ENDOVASCULAR REPAIR/STENT GRAFT N/A 08/14/2019   Procedure: ENDOVASCULAR REPAIR/STENT GRAFT;  Surgeon: Katha Cabal, MD;  Location: [facility];  Service: Cardiovascular;  Laterality: N/A;     Social History:   reports that he has quit smoking. His smoking use included cigarettes. He smoked 0.33 packs per day. He has never used smokeless tobacco. He reports that he does not drink alcohol and does not use drugs.   Family History:  His family history includes Heart disease in his brother, brother, father, sister, and sister; Hyperlipidemia in his brother, brother, brother, brother, brother, and brother; Sudden Cardiac Death in his father.   Allergies Allergies  Allergen Reactions  . Ivp Dye [Iodinated Diagnostic Agents]     ESRD patient      Home Medications  Prior to Admission medications   Medication Sig Start Date End Date Taking? Authorizing Provider  acetaminophen (TYLENOL) 500 MG tablet Take 500-1,000 mg by mouth every 8 (eight) hours as needed for mild pain or moderate pain.    Yes [provider]  calcium carbonate (TUMS - DOSED IN MG ELEMENTAL CALCIUM) 500 MG chewable tablet Chew 1 tablet by mouth 3 (three) times daily as needed for heartburn.   Yes  [provider]  multivitamin (RENA-VIT) TABS tablet Take 1 tablet by mouth at bedtime. 09/30/19  Yes Fritzi Mandes, MD  albuterol (VENTOLIN HFA) 108 (90 Base) MCG/ACT inhaler SMARTSIG:2 Inhalation Via Inhaler Every 6 Hours PRN Patient not taking: No sig reported 06/12/20   [provider]  benzonatate (TESSALON) 100 MG capsule Take 1 capsule (100 mg total) by mouth every 8 (eight) hours. 06/27/20   Loletha Grayer, MD  carvedilol (COREG) 3.125 MG tablet Take 1 tablet (3.125 mg total) by mouth 2 (two) times daily with a meal. 06/27/20   Leslye Peer, Richard, MD  lidocaine (XYLOCAINE) 2 % solution Use as directed 15 mLs in the mouth or throat every 4 (four) hours as needed for mouth pain. 06/27/20   Loletha Grayer, MD  ondansetron (ZOFRAN) 4 MG tablet Take 1 tablet (4 mg total) by mouth daily as needed for nausea or vomiting. 06/27/20 06/27/21  Loletha Grayer, MD  pantoprazole (PROTONIX) 40 MG tablet Take 1 tablet 2 times a day for 30 days, then change to 1 tablet a day 06/27/20   Loletha Grayer, MD  predniSONE (DELTASONE) 10 MG tablet Two tabs po daily for six days 06/27/20   Loletha Grayer, MD     Critical care time: 60 minutes    Darel Hong, [facility] [place] Pager: [phone]

## 2020-07-02 NOTE — Consult Note (Signed)
[place] for heparin  Indication: chest pain/ACS  Allergies  Allergen Reactions  . Ivp Dye [Iodinated Diagnostic Agents]     ESRD patient     Patient Measurements: Height: '5\' 10"'$  (177.8 cm) Weight: 55.4 kg (122 lb 2.2 oz) IBW/kg (Calculated) : 73 Heparin Dosing Weight: 55 kg  Vital Signs: Temp: 98.4 F (36.9 C) (02/09 1625) BP: 137/91 (02/09 2244) Pulse Rate: 99 (02/09 2244)  Labs: Recent Labs    06/29/20 0213 06/30/20 0515 07/01/20 0515 07/01/20 1255 07/01/20 1518 07/01/20 2105 07/01/20 2258  HGB 10.7* 10.6* 10.5*  --   --   --   --   HCT 31.9* 31.8* 31.0*  --   --   --   --   PLT 134* 112* 124*  --   --   --   --   APTT 41*  --   --   --  47*  --   --   LABPROT 14.3  --   --   --  17.7*  --   --   INR 1.2  --   --   --  1.5*  --   --   HEPARINUNFRC  --   --   --   --   --   --  0.41  CREATININE 7.49* 9.26* 6.03*  --   --   --   --   TROPONINIHS  --   --   --    < > 1,207* 988* 905*   < > = values in this interval not displayed.    Estimated Creatinine Clearance: 8.5 mL/min (A) (by C-G formula based on SCr of 6.03 mg/dL (H)).   Medical History: Past Medical History:  Diagnosis Date  . Hyperlipidemia 07/2019  . Hypertension 07/2019  . PVD (peripheral vascular disease) ([place]) 07/2019  . Renal disorder 07/2019   ESRD  . Reynolds syndrome ([place]) 05/23/2010    Medications:  No PTA APT or AC No pertinent AC allergies  Assessment: 75 year old male with medical history of ESRD on HD, anemia of chronic kidney disease, hypertension, CHF, peripheral vascular disease, hyperlipidemia, and Raynaud's syndrome who was admitted 1/27-2/5 for actue GI bleed and COVID. He returned due to continued weakness and poor PO intake since discharge. His EKG on 2/9 shows acute changes and was found to have a troponin elevated to 1196. Pharmacy consulted to start heparin.  Heparin Dosing Weight: 55 kg Hgb: 10.5 and plts:  124  Date Time HL Rate/comment 02/09 2258 0.41 therapeutic   Goal of Therapy:  Heparin level 0.3-0.7 units/ml Monitor platelets by anticoagulation protocol: Yes   Plan:   Continue infusion rate at 650 units/hr Recheck HL in 8hr to confirm then daily Continue to monitor H&H and Plt.  Renda Rolls, PharmD, [facility] 07/02/2020 12:38 AM

## 2020-07-02 NOTE — Progress Notes (Signed)
Pt family is asking for a GI consult. Dr. Jimmye Norman notified via secure chat.

## 2020-07-02 NOTE — Progress Notes (Signed)
Progress Note  Patient Name: Augustino Leard Date of Encounter: 07/02/2020  [facility] Cardiologist: Ida Rogue, MD   Subjective   Long discussion with family and the room and [place] with extended family present Have been doing well until recently, Past 5 days with weakness, anorexia, Covid positive noted this past weekend Came into the hospital was discharged, returned to the hospital with progressive weakness anorexia -Echocardiogram reviewed and discussed with family, drop in ejection fraction 20 to 25%, unable to exclude stress cardiomyopathy -Currently lethargic though arousable, nutrition with tube feed given anorexia, chronic diarrhea  Inpatient Medications    Scheduled Meds: . benzonatate  100 mg Oral Q8H  . carvedilol  6.25 mg Per Tube BID WC  . Chlorhexidine Gluconate Cloth  6 each Topical Daily  . feeding supplement (PROSource TF)  45 mL Per Tube TID  . fiber  1 packet Per Tube TID  . free water  30 mL Per Tube Q4H  . gentamicin cream  1 application Topical Daily  . hydrALAZINE  50 mg Oral Q8H  . isosorbide dinitrate  20 mg Oral TID  . LORazepam  1 mg Intravenous Once  . methylPREDNISolone (SOLU-MEDROL) injection  16 mg Intravenous Daily  . multivitamin  1 tablet Per Tube QHS  . pantoprazole (PROTONIX) IV  40 mg Intravenous Q12H  . sodium chloride flush  3 mL Intravenous Q12H   Continuous Infusions: . dialysis solution 1.5% low-MG/low-CA    . feeding supplement (NEPRO CARB STEADY) 1,000 mL (07/02/20 1727)  . heparin 650 Units/hr (07/01/20 1523)  . piperacillin-tazobactam (ZOSYN)  IV 2.25 g (07/02/20 1152)   PRN Meds: acetaminophen (TYLENOL) oral liquid 160 mg/5 mL, albuterol, hydrALAZINE, loperamide HCl, morphine injection, nitroGLYCERIN, ondansetron **OR** ondansetron (ZOFRAN) IV   Vital Signs    Vitals:   07/02/20 0825 07/02/20 0825 07/02/20 1156 07/02/20 1502  BP: (!) 180/107 (!) 180/107 (!) 175/99 (!) 175/127  Pulse: (!) 101 (!) 101 87 94   Resp: '17 17 20 20  '$ Temp: 98.4 F (36.9 C) 98.4 F (36.9 C) 98 F (36.7 C) 97.8 F (36.6 C)  TempSrc: Oral Oral  Oral  SpO2: 100% 100% 98% 98%  Weight:      Height:        Intake/Output Summary (Last 24 hours) at 07/02/2020 1823 Last data filed at 07/02/2020 1800 Gross per 24 hour  Intake 2500.52 ml  Output -  Net 2500.52 ml   Last 3 Weights 06/30/2020 06/29/2020 06/23/2020  Weight (lbs) 122 lb 2.2 oz 121 lb 4.1 oz 121 lb 4.1 oz  Weight (kg) 55.4 kg 55 kg 55 kg      Telemetry    Normal sinus rhythm- Personally Reviewed  ECG     - Personally Reviewed  Physical Exam   GEN: Lethargic, minimally arousable Neck:  JVD 10+ Cardiac: RRR, no murmurs, rubs, or gallops.  Respiratory: Clear to auscultation bilaterally. GI: Soft, nontender, non-distended  MS: No edema; No deformity. Neuro:   Very lethargic, unable to test Psych: Unable to test  Labs    High Sensitivity Troponin:   Recent Labs  Lab 06/18/20 2000 07/01/20 1255 07/01/20 1518 07/01/20 2105 07/01/20 2258  TROPONINIHS 92* 1,196* 1,207* 988* 905*      Chemistry Recent Labs  Lab 06/29/20 0213 06/30/20 0515 07/01/20 0515 07/02/20 0648  NA 135 138 137 140  K 4.4 4.8 4.0 4.0  CL 95* 97* 96* 98  CO2 18* 18* 21* 20*  GLUCOSE 123* 135* 197* 228*  BUN 71* 97* 62* 105*  CREATININE 7.49* 9.26* 6.03* 8.66*  CALCIUM 7.1* 7.7* 7.8* 7.1*  PROT 6.0*  6.1* 5.3* 5.5*  --   ALBUMIN 2.7*  2.7* 2.3* 2.5*  --   AST 44*  44* 33 39  --   ALT '16  15 14 15  '$ --   ALKPHOS 49  51 42 47  --   BILITOT 1.2  1.2 1.1 1.0  --   GFRNONAA 7* 5* 9* 6*  ANIONGAP 22* 23* 20* 22*     Hematology Recent Labs  Lab 06/30/20 0515 07/01/20 0515 07/02/20 0648  WBC 6.5 5.3 9.1  RBC 3.29* 3.26* 2.70*  HGB 10.6* 10.5* 8.8*  HCT 31.8* 31.0* 26.3*  MCV 96.7 95.1 97.4  MCH 32.2 32.2 32.6  MCHC 33.3 33.9 33.5  RDW 16.8* 17.4* 17.8*  PLT 112* 124* 134*    BNPNo results for input(s): BNP, PROBNP in the last 168 hours.   DDimer  No results for input(s): DDIMER in the last 168 hours.   Radiology    US Venous Img Upper Uni Right(DVT)  Result Date: 07/02/2020 CLINICAL DATA:  75 year old male with right arm swelling. EXAM: RIGHT UPPER EXTREMITY VENOUS DOPPLER ULTRASOUND TECHNIQUE: Gray-scale sonography with graded compression, as well as color Doppler and duplex ultrasound were performed to evaluate the upper extremity deep venous system from the level of the subclavian vein and including the jugular, axillary, basilic, radial, ulnar and upper cephalic vein. Spectral Doppler was utilized to evaluate flow at rest and with distal augmentation maneuvers. COMPARISON:  None. FINDINGS: Contralateral Subclavian Vein: Not well visualized due to bandages from dialysis access. Internal Jugular Vein: Limited visualization. No evidence of thrombus. Normal compressibility, respiratory phasicity and response to augmentation. Subclavian Vein: No evidence of thrombus. Normal compressibility, respiratory phasicity and response to augmentation. Axillary Vein: Limited visualization.  No evidence of thrombus. Cephalic Vein: No evidence of thrombus. Normal compressibility, respiratory phasicity and response to augmentation. Basilic Vein: No evidence of thrombus. Normal compressibility, respiratory phasicity and response to augmentation. Brachial Veins: No evidence of thrombus. Normal compressibility, respiratory phasicity and response to augmentation. Radial Veins: No evidence of thrombus. Normal compressibility, respiratory phasicity and response to augmentation. Ulnar Veins: No evidence of thrombus. Normal compressibility, respiratory phasicity and response to augmentation. Other Findings:  None visualized. IMPRESSION: No evidence of right upper extremity deep vein thrombosis. Limited visualization of the right internal jugular and right axillary veins. Ruthann Cancer, MD [facility] Radiology Electronically  Signed   By: Ruthann Cancer MD   On: 07/02/2020 11:51   DG Chest Port 1 View  Result Date: 07/01/2020 CLINICAL DATA:  Fluid overload EXAM: PORTABLE CHEST 1 VIEW COMPARISON:  07/14/2020 FINDINGS: Cardiac shadow is within normal limits. Aortic calcifications are seen. Gastric catheter and dialysis catheter are noted in satisfactory position. Increased left basilar infiltrate with associated effusion is noted. No other focal parenchymal abnormality is noted. IMPRESSION: Increasing consolidation in the left base with associated small effusion. Electronically Signed   By: Inez Catalina M.D.   On: 07/01/2020 12:36   ECHOCARDIOGRAM COMPLETE  Result Date: 07/02/2020    ECHOCARDIOGRAM REPORT   Patient Name:   LEVIN PULLING Date of Exam: 07/02/2020 Medical Rec #:  [id number]         Height:       70.0 in Accession #:    [id number]        Weight:       122.1 lb Date of  Birth:  01/08/1946         BSA:          1.693 m Patient Age:    59 years          BP:           183/113 mmHg Patient Gender: M                 HR:           99 bpm. Exam Location:  [facility] Procedure: 2D Echo, Color Doppler and Cardiac Doppler Indications:     I21.4 NSTEMI  History:         Patient has prior history of Echocardiogram examinations, most                  recent 01/29/2020. PVD, CKD; Risk Factors:Dyslipidemia and                  Hypertension. Pt tested positive for COVID-19 on 06/18/20.  Sonographer:     Charmayne Sheer RDCS (AE) Referring Phys:  [id number] Kate Sable Diagnosing Phys: Ida Rogue MD  Sonographer Comments: Suboptimal subcostal window. IMPRESSIONS  1. Left ventricular ejection fraction, by estimation, is 20 to 25%. The left ventricle has severely decreased function. The left ventricle demonstrates global hypokinesis, basal regions best preserved. Unable to exclude component of stress cardiomyopathy. The left ventricular internal cavity size was mildly dilated. Left ventricular diastolic parameters are consistent with Grade II  diastolic dysfunction (pseudonormalization).  2. Right ventricular systolic function is normal. The right ventricular size is normal.  3. Mild mitral valve regurgitation.  4. Aortic valve regurgitation is mild to moderate. FINDINGS  Left Ventricle: Left ventricular ejection fraction, by estimation, is 20 to 25%. The left ventricle has severely decreased function. The left ventricle demonstrates global hypokinesis. The left ventricular internal cavity size was mildly dilated. There is no left ventricular hypertrophy. Left ventricular diastolic parameters are consistent with Grade II diastolic dysfunction (pseudonormalization). Right Ventricle: The right ventricular size is normal. No increase in right ventricular wall thickness. Right ventricular systolic function is normal. Left Atrium: Left atrial size was normal in size. Right Atrium: Right atrial size was normal in size. Pericardium: There is no evidence of pericardial effusion. Mitral Valve: The mitral valve is normal in structure. Mild mitral annular calcification. Mild mitral valve regurgitation. No evidence of mitral valve stenosis. MV peak gradient, 4.8 mmHg. The mean mitral valve gradient is 2.0 mmHg. Tricuspid Valve: The tricuspid valve is normal in structure. Tricuspid valve regurgitation is mild . No evidence of tricuspid stenosis. Aortic Valve: The aortic valve is normal in structure. Aortic valve regurgitation is mild to moderate. No aortic stenosis is present. Aortic valve mean gradient measures 1.0 mmHg. Aortic valve peak gradient measures 2.8 mmHg. Aortic valve area, by VTI measures 2.54 cm. Pulmonic Valve: The pulmonic valve was normal in structure. Pulmonic valve regurgitation is mild. No evidence of pulmonic stenosis. Aorta: The aortic root is normal in size and structure. Venous: The inferior vena cava is normal in size with greater than 50% respiratory variability, suggesting right atrial pressure of 3 mmHg. IAS/Shunts: No atrial level shunt  detected by color flow Doppler.  LEFT VENTRICLE PLAX 2D LVIDd:         4.90 cm      Diastology LVIDs:         4.00 cm      LV e' medial:    2.83 cm/s LV PW:  1.00 cm      LV E/e' medial:  37.8 LV IVS:        0.90 cm      LV e' lateral:   4.35 cm/s LVOT diam:     2.30 cm      LV E/e' lateral: 24.6 LV SV:         33 LV SV Index:   20 LVOT Area:     4.15 cm  LV Volumes (MOD) LV vol d, MOD A2C: 114.0 ml LV vol d, MOD A4C: 113.0 ml LV vol s, MOD A2C: 70.4 ml LV vol s, MOD A4C: 72.6 ml LV SV MOD A2C:     43.6 ml LV SV MOD A4C:     113.0 ml LV SV MOD BP:      37.4 ml RIGHT VENTRICLE RV Basal diam:  2.90 cm LEFT ATRIUM             Index       RIGHT ATRIUM           Index LA diam:        3.80 cm 2.25 cm/m  RA Area:     11.60 cm LA Vol (A2C):   43.7 ml 25.82 ml/m RA Volume:   28.90 ml  17.07 ml/m LA Vol (A4C):   47.5 ml 28.06 ml/m LA Biplane Vol: 48.7 ml 28.77 ml/m  AORTIC VALVE                   PULMONIC VALVE AV Area (Vmax):    2.72 cm    PV Vmax:       0.93 m/s AV Area (Vmean):   2.64 cm    PV Vmean:      52.300 cm/s AV Area (VTI):     2.54 cm    PV VTI:        0.117 m AV Vmax:           83.80 cm/s  PV Peak grad:  3.5 mmHg AV Vmean:          53.800 cm/s PV Mean grad:  1.0 mmHg AV VTI:            0.131 m AV Peak Grad:      2.8 mmHg AV Mean Grad:      1.0 mmHg LVOT Vmax:         54.80 cm/s LVOT Vmean:        34.200 cm/s LVOT VTI:          0.080 m LVOT/AV VTI ratio: 0.61  AORTA Ao Root diam: 3.10 cm MITRAL VALVE                TRICUSPID VALVE MV Area (PHT): 5.61 cm     TR Peak grad:   41.0 mmHg MV Area VTI:   1.80 cm     TR Vmax:        320.00 cm/s MV Peak grad:  4.8 mmHg MV Mean grad:  2.0 mmHg     SHUNTS MV Vmax:       1.09 m/s     Systemic VTI:  0.08 m MV Vmean:      64.8 cm/s    Systemic Diam: 2.30 cm MV Decel Time: 135 msec MV E velocity: 107.00 cm/s Ida Rogue MD Electronically signed by Ida Rogue MD Signature Date/Time: 07/02/2020/2:55:42 PM    Final     Cardiac Studies   Echocardiogram  as above, severely depressed ejection fraction  Patient  Profile     Fotios Witter is a 75 y.o. male with a hx of end-stage renal disease, HFrEF who is being seen today for the evaluation of elevated troponins   Assessment & Plan    Non-STEMI In the setting of COVID-19 pneumonia Also with associated weakness, anorexia, weight loss Also in the setting of chronic renal failure on hemodialysis -Long discussion with family concerning various treatment options, Will monitor hemoglobin, continue heparin infusion for now with hold of heparin for further drop in hemoglobin -No plans for cardiac catheterization at this time -Catheterization previously held prior to start of hemodialysis out of concern for renal function, he had negative stress test months ago -Catheterization could be reconsidered if he gets stronger, recovers from current illness  COVID-19 pneumonia Other family members ill with COVID-19 He has anorexia, weakness, Would continue supportive efforts, agreed with tube feeds for feeding  Chronic diarrhea Etiology unclear, could consider GI consultation culture stool Does not seem to be responding to Imodium Unclear if this is part of his GI pathology  Cardiomyopathy Long discussion with family, prior ejection fraction 40%, new drop to 20 to 25% Select images concerning for stress cardiomyopathy with basal region wall motion best preserved -Unable to exclude underlying ischemia -Also need to consider chronic hypertensive heart disease, blood pressure poorly controlled -We will increase hydralazine up to 50 3 times daily, add isosorbide dinitrate 20 3 times daily -Continue carvedilol -As outpatient was taking irbesartan as directed by nephrology  -End-stage renal disease on hemodialysis -Discussed with family, discussed with nephrology, plan for peritoneal dialysis today This is to be done in the room  Long discussion with family that he is critically ill, multiorgan  system failure, we should consider palliative options, they would like to push on with supportive care at this time  Total encounter time more than 60 minutes  Greater than 50% was spent in counseling and coordination of care with the patient   For questions or updates, please contact [facility] Please consult [email or web address] for contact info under        Signed, Ida Rogue, MD  07/02/2020, 6:23 PM

## 2020-07-02 NOTE — Progress Notes (Signed)
*  PRELIMINARY RESULTS* Echocardiogram 2D Echocardiogram has been performed.  Scott Oconnor 07/02/2020, 9:53 AM

## 2020-07-02 NOTE — Consult Note (Signed)
[facility] for heparin  Indication: chest pain/ACS  Allergies  Allergen Reactions  . Ivp Dye [Iodinated Diagnostic Agents]     ESRD patient     Patient Measurements: Height: '5\' 10"'$  (177.8 cm) Weight: 55.4 kg (122 lb 2.2 oz) IBW/kg (Calculated) : 73 Heparin Dosing Weight: 55 kg  Vital Signs: Temp: 99.2 F (37.3 C) (02/10 0634) Temp Source: Axillary (02/10 0634) BP: 183/113 (02/10 0634) Pulse Rate: 101 (02/10 0634)  Labs: Recent Labs    06/30/20 0515 07/01/20 0515 07/01/20 1255 07/01/20 1518 07/01/20 2105 07/01/20 2258 07/02/20 0648  HGB 10.6* 10.5*  --   --   --   --  8.8*  HCT 31.8* 31.0*  --   --   --   --  26.3*  PLT 112* 124*  --   --   --   --  134*  APTT  --   --   --  47*  --   --   --   LABPROT  --   --   --  17.7*  --   --   --   INR  --   --   --  1.5*  --   --   --   HEPARINUNFRC  --   --   --   --   --  0.41 0.31  CREATININE 9.26* 6.03*  --   --   --   --   --   TROPONINIHS  --   --    < > 1,207* 988* 905*  --    < > = values in this interval not displayed.    Estimated Creatinine Clearance: 8.5 mL/min (A) (by C-G formula based on SCr of 6.03 mg/dL (H)).   Medical History: Past Medical History:  Diagnosis Date  . Hyperlipidemia 07/2019  . Hypertension 07/2019  . PVD (peripheral vascular disease) ([place]) 07/2019  . Renal disorder 07/2019   ESRD  . 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 He returned due to continued weakness and poor PO intake since discharge. His EKG on 2/9 shows acute changes and was found to have a troponin elevated to 1196. Pharmacy consulted to start heparin.  Heparin Dosing Weight: 55 kg Hgb:  10.5 > 8.8 and plts: 124 > 134  Date Time HL Rate/comment 02/09 2258 0.41 Therapeutic 02/10 0645 0.31 Therapeutic.    Goal of Therapy:  Heparin level 0.3-0.7 units/ml Monitor platelets by anticoagulation protocol: Yes   Plan:   Heparin level is therapeutic. Will continue current rate at 650 units/hr. Recheck heparin level and CBC with AM labs. Plan to continue heparin for 48 hours.   Eleonore Chiquito, PharmD, BCPS 07/02/2020 7:52 AM

## 2020-07-02 NOTE — Progress Notes (Addendum)
PROGRESS NOTE    Scott Oconnor  [id number] DOB: [birth year] DOA: 07/09/2020 PCP: [name], MD    Assessment & Plan:   Active Problems:   Failure to thrive in adult   Non-ST elevation (NSTEMI) myocardial infarction [facility])  NSTEMI: troponins elevated but trending down today. Continue on coreg, hydralazine. Echo shows EF 20-25%, grade II diastolic dysfunction & global hypokinesis. Continue on tele. Cardio following and recs apprec  Severe sepsis: secondary to COVID-19 & bacterial pneumonia. Continue on remdesivir, IV steroids, & IV zosyn. Encourage incentive spirometry   Recent GI bleed: eliquis has been d/c.H&H are trending down today. Fecal occult positive. Repeat H&H ordered. Not likely a EGD/colonoscopy candidate. GI consulted.   Metabolic encephalopathy: labile. Likely in setting of COVID-19 pneumonia, also had uremia during previous admission. CT brain shows no acute intracranial findings  ESRD: on PD at home. Continue w/ HD as per nephro. Nephro recs apprec   Failure to thrive: w/ poor p.o. intake. Has been declining since 20th January, as per daughter. Continue w/ tube feeds via NG tube but pt has had multiple episodes of diarrhea. Will discuss w/ nutrition   Thrombocytopenia: etiology unclear. Will continue to monitor   ACD: likely secondary to ESRD but possibly GI bleed as well.   Underweight: BMI 17.5. Continue w/ tube feeds  DVT prophylaxis: SCDs, IV heparin  Code Status: DNR/DNI Family Communication: discussed pt's care w/ pt's children at bedside and answered their  questions  Disposition Plan: unclear.   Level of care: Progressive Cardiac   Status is: Inpatient  Remains inpatient appropriate because:Altered mental status, IV treatments appropriate due to intensity of illness or inability to take PO and Inpatient level of care appropriate due to severity of illness   Dispo: The patient is from: Home              Anticipated d/c is to: Home               Anticipated d/c date is: > 3 days              Patient currently is not medically stable to d/c.   Difficult to place patient No     Consultants:   nephro    Procedures:    Antimicrobials: zosyn    Subjective: Pt is lethargic.   Objective: Vitals:   07/02/20 0634 07/02/20 0825 07/02/20 0825 07/02/20 1156  BP: (!) 183/113 (!) 180/107 (!) 180/107 (!) 175/99  Pulse: (!) 101 (!) 101 (!) 101 87  Resp: '20 17 17 20  '$ Temp: 99.2 F (37.3 C) 98.4 F (36.9 C) 98.4 F (36.9 C) 98 F (36.7 C)  TempSrc: Axillary Oral Oral   SpO2:  100% 100% 98%  Weight:      Height:        Intake/Output Summary (Last 24 hours) at 07/02/2020 1458 Last data filed at 07/01/2020 2200 Gross per 24 hour  Intake 660 ml  Output --  Net 660 ml   Filed Weights   07/07/2020 1636 06/29/20 0500 06/30/20 0500  Weight: 55 kg 55 kg 55.4 kg    Examination:  General exam: Appears uncomfortable  Respiratory system: decreased breath sounds b/l  Cardiovascular system: S1/S2+. No clicks or rubs. No pedal edema. Gastrointestinal system: Abd is soft, NT, ND & hyperactive bowel sounds  Central nervous system: Lethargic. Moves all 4 extremities  Psychiatry: Judgement and insight appear abnormal. Flat mood and affect.     Data Reviewed: I have personally reviewed following labs  and imaging studies  CBC: Recent Labs  Lab 07/12/2020 1725 06/29/20 0213 06/30/20 0515 07/01/20 0515 07/02/20 0648  WBC 10.8* 11.1* 6.5 5.3 9.1  NEUTROABS 9.1* 9.2*  --   --   --   HGB 10.7* 10.7* 10.6* 10.5* 8.8*  HCT 31.4* 31.9* 31.8* 31.0* 26.3*  MCV 95.7 96.4 96.7 95.1 97.4  PLT 128* 134* 112* 124* [id number]*   Basic Metabolic Panel: Recent Labs  Lab 07/12/2020 1920 06/29/20 0213 06/29/20 0517 06/30/20 0515 06/30/20 1035 06/30/20 1702 07/01/20 0515 07/02/20 0648  NA 136 135  --  138  --   --  137 140  K 5.2* 4.4  --  4.8  --   --  4.0 4.0  CL 95* 95*  --  97*  --   --  96* 98  CO2 24 18*  --  18*  --   --  21* 20*   GLUCOSE 100* 123*  --  135*  --   --  197* 228*  BUN 64* 71*  --  97*  --   --  62* 105*  CREATININE 6.84* 7.49*  --  9.26*  --   --  6.03* 8.66*  CALCIUM 7.0* 7.1*  --  7.7*  --   --  7.8* 7.1*  MG  --   --  2.4 2.5*  --  2.2 2.2  --   PHOS  --   --  9.0* 12.3* 12.7* 6.9* 8.8*  --    GFR: Estimated Creatinine Clearance: 6 mL/min (A) (by C-G formula based on SCr of 8.66 mg/dL (H)). Liver Function Tests: Recent Labs  Lab 06/29/20 0213 06/30/20 0515 07/01/20 0515  AST 44*  44* 33 39  ALT '16  15 14 15  '$ ALKPHOS 49  51 42 47  BILITOT 1.2  1.2 1.1 1.0  PROT 6.0*  6.1* 5.3* 5.5*  ALBUMIN 2.7*  2.7* 2.3* 2.5*   Recent Labs  Lab 06/29/20 0213  LIPASE 142*   Recent Labs  Lab 06/29/20 0517  AMMONIA 14   Coagulation Profile: Recent Labs  Lab 06/29/20 0213 07/01/20 1518  INR 1.2 1.5*   Cardiac Enzymes: Recent Labs  Lab 07/01/2020 1725  CKTOTAL 341   BNP (last 3 results) No results for input(s): PROBNP in the last 8760 hours. HbA1C: No results for input(s): HGBA1C in the last 72 hours. CBG: Recent Labs  Lab 07/01/20 1211 07/01/20 1634 07/01/20 2029 07/02/20 0821 07/02/20 1227  GLUCAP 258* 230* 238* 192* 243*   Lipid Profile: No results for input(s): CHOL, HDL, LDLCALC, TRIG, CHOLHDL, LDLDIRECT in the last 72 hours. Thyroid Function Tests: No results for input(s): TSH, T4TOTAL, FREET4, T3FREE, THYROIDAB in the last 72 hours. Anemia Panel: No results for input(s): VITAMINB12, FOLATE, FERRITIN, TIBC, IRON, RETICCTPCT in the last 72 hours. Sepsis Labs: Recent Labs  Lab 07/11/2020 1725 06/29/20 0213 06/29/20 0517 07/02/20 1019  LATICACIDVEN 1.5 2.7* 1.8 2.0*    Recent Results (from the past 240 hour(s))  Culture, blood (x 2)     Status: None   Collection Time: 06/24/20 10:00 PM   Specimen: BLOOD  Result Value Ref Range Status   Specimen Description BLOOD BLOOD LEFT HAND  Final   Special Requests   Final    BOTTLES DRAWN AEROBIC ONLY Blood Culture  results may not be optimal due to an inadequate volume of blood received in culture bottles   Culture   Final    NO GROWTH 5 DAYS Performed at [facility]  Lab, [place], [place] [address]    Report Status 06/29/2020 FINAL  Final  Culture, blood (x 2)     Status: None   Collection Time: 06/24/20 10:01 PM   Specimen: BLOOD  Result Value Ref Range Status   Specimen Description BLOOD BLOOD RIGHT HAND  Final   Special Requests   Final    BOTTLES DRAWN AEROBIC ONLY Blood Culture results may not be optimal due to an inadequate volume of blood received in culture bottles   Culture   Final    NO GROWTH 5 DAYS Performed at [facility], [address] [place], [place] [address]    Report Status 06/29/2020 FINAL  Final  MRSA PCR Screening     Status: None   Collection Time: 06/25/20  6:27 PM   Specimen: Nasopharyngeal  Result Value Ref Range Status   MRSA by PCR NEGATIVE NEGATIVE Final    Comment:        The GeneXpert MRSA Assay (FDA approved for NASAL specimens only), is one component of a comprehensive MRSA colonization surveillance program. It is not intended to diagnose MRSA infection nor to guide or monitor treatment for MRSA infections. Performed at [facility], [address] [place], [place] [address]   Blood culture (routine x 2)     Status: None (Preliminary result)   Collection Time: 07/04/2020  5:26 PM   Specimen: BLOOD  Result Value Ref Range Status   Specimen Description BLOOD RIGHT ANTECUBITAL  Final   Special Requests   Final    BOTTLES DRAWN AEROBIC AND ANAEROBIC Blood Culture results may not be optimal due to an inadequate volume of blood received in culture bottles   Culture   Final    NO GROWTH 4 DAYS Performed at [facility], [address], [place], [place] [address]    Report Status PENDING  Incomplete  Blood culture (routine x 2)     Status: None (Preliminary result)   Collection Time: 06/27/2020  5:26 PM    Specimen: BLOOD  Result Value Ref Range Status   Specimen Description BLOOD LEFT ANTECUBITAL  Final   Special Requests   Final    BOTTLES DRAWN AEROBIC AND ANAEROBIC Blood Culture results may not be optimal due to an inadequate volume of blood received in culture bottles   Culture   Final    NO GROWTH 4 DAYS Performed at [facility], [address] [place], [place] [address]    Report Status PENDING  Incomplete  C Difficile Quick Screen w PCR reflex     Status: None   Collection Time: 06/30/20  5:08 PM   Specimen: STOOL  Result Value Ref Range Status   C Diff antigen NEGATIVE NEGATIVE Final   C Diff toxin NEGATIVE NEGATIVE Final   C Diff interpretation No C. difficile detected.  Final    Comment: Performed at [facility], [address], [place], [place] [address]         Radiology Studies: US Venous Img Upper Uni Right(DVT)  Result Date: 07/02/2020 CLINICAL DATA:  75 year old male with right arm swelling. EXAM: RIGHT UPPER EXTREMITY VENOUS DOPPLER ULTRASOUND TECHNIQUE: Gray-scale sonography with graded compression, as well as color Doppler and duplex ultrasound were performed to evaluate the upper extremity deep venous system from the level of the subclavian vein and including the jugular, axillary, basilic, radial, ulnar and upper cephalic vein. Spectral Doppler was utilized to evaluate flow at rest and with distal augmentation  maneuvers. COMPARISON:  None. FINDINGS: Contralateral Subclavian Vein: Not well visualized due to bandages from dialysis access. Internal Jugular Vein: Limited visualization. No evidence of thrombus. Normal compressibility, respiratory phasicity and response to augmentation. Subclavian Vein: No evidence of thrombus. Normal compressibility, respiratory phasicity and response to augmentation. Axillary Vein: Limited visualization.  No evidence of thrombus. Cephalic Vein: No evidence of thrombus. Normal compressibility, respiratory phasicity  and response to augmentation. Basilic Vein: No evidence of thrombus. Normal compressibility, respiratory phasicity and response to augmentation. Brachial Veins: No evidence of thrombus. Normal compressibility, respiratory phasicity and response to augmentation. Radial Veins: No evidence of thrombus. Normal compressibility, respiratory phasicity and response to augmentation. Ulnar Veins: No evidence of thrombus. Normal compressibility, respiratory phasicity and response to augmentation. Other Findings:  None visualized. IMPRESSION: No evidence of right upper extremity deep vein thrombosis. Limited visualization of the right internal jugular and right axillary veins. Ruthann Cancer, MD [facility] Radiology Electronically Signed   By: Ruthann Cancer MD   On: 07/02/2020 11:51   DG Chest Port 1 View  Result Date: 07/01/2020 CLINICAL DATA:  Fluid overload EXAM: PORTABLE CHEST 1 VIEW COMPARISON:  07/19/2020 FINDINGS: Cardiac shadow is within normal limits. Aortic calcifications are seen. Gastric catheter and dialysis catheter are noted in satisfactory position. Increased left basilar infiltrate with associated effusion is noted. No other focal parenchymal abnormality is noted. IMPRESSION: Increasing consolidation in the left base with associated small effusion. Electronically Signed   By: Inez Catalina M.D.   On: 07/01/2020 12:36   ECHOCARDIOGRAM COMPLETE  Result Date: 07/02/2020    ECHOCARDIOGRAM REPORT   Patient Name:   NELVIN MCCOWAN Date of Exam: 07/02/2020 Medical Rec #:  [id number]         Height:       70.0 in Accession #:    [id number]        Weight:       122.1 lb Date of Birth:  Jan 07, 1946         BSA:          1.693 m Patient Age:    75 years          BP:           183/113 mmHg Patient Gender: M                 HR:           99 bpm. Exam Location:  [facility] Procedure: 2D Echo, Color Doppler and Cardiac Doppler Indications:     I21.4 NSTEMI  History:          Patient has prior history of Echocardiogram examinations, most                  recent 01/29/2020. PVD, CKD; Risk Factors:Dyslipidemia and                  Hypertension. Pt tested positive for COVID-19 on 06/18/20.  Sonographer:     Charmayne Sheer RDCS (AE) Referring Phys:  [id number] Kate Sable Diagnosing Phys: Ida Rogue MD  Sonographer Comments: Suboptimal subcostal window. IMPRESSIONS  1. Left ventricular ejection fraction, by estimation, is 20 to 25%. The left ventricle has severely decreased function. The left ventricle demonstrates global hypokinesis, basal regions best preserved. Unable to exclude component of stress cardiomyopathy. The left ventricular internal cavity size was mildly dilated. Left ventricular diastolic parameters are consistent with Grade II diastolic dysfunction (pseudonormalization).  2. Right ventricular systolic function is normal. The  right ventricular size is normal.  3. Mild mitral valve regurgitation.  4. Aortic valve regurgitation is mild to moderate. FINDINGS  Left Ventricle: Left ventricular ejection fraction, by estimation, is 20 to 25%. The left ventricle has severely decreased function. The left ventricle demonstrates global hypokinesis. The left ventricular internal cavity size was mildly dilated. There is no left ventricular hypertrophy. Left ventricular diastolic parameters are consistent with Grade II diastolic dysfunction (pseudonormalization). Right Ventricle: The right ventricular size is normal. No increase in right ventricular wall thickness. Right ventricular systolic function is normal. Left Atrium: Left atrial size was normal in size. Right Atrium: Right atrial size was normal in size. Pericardium: There is no evidence of pericardial effusion. Mitral Valve: The mitral valve is normal in structure. Mild mitral annular calcification. Mild mitral valve regurgitation. No evidence of mitral valve stenosis. MV peak gradient, 4.8 mmHg. The mean mitral valve gradient is  2.0 mmHg. Tricuspid Valve: The tricuspid valve is normal in structure. Tricuspid valve regurgitation is mild . No evidence of tricuspid stenosis. Aortic Valve: The aortic valve is normal in structure. Aortic valve regurgitation is mild to moderate. No aortic stenosis is present. Aortic valve mean gradient measures 1.0 mmHg. Aortic valve peak gradient measures 2.8 mmHg. Aortic valve area, by VTI measures 2.54 cm. Pulmonic Valve: The pulmonic valve was normal in structure. Pulmonic valve regurgitation is mild. No evidence of pulmonic stenosis. Aorta: The aortic root is normal in size and structure. Venous: The inferior vena cava is normal in size with greater than 50% respiratory variability, suggesting right atrial pressure of 3 mmHg. IAS/Shunts: No atrial level shunt detected by color flow Doppler.  LEFT VENTRICLE PLAX 2D LVIDd:         4.90 cm      Diastology LVIDs:         4.00 cm      LV e' medial:    2.83 cm/s LV PW:         1.00 cm      LV E/e' medial:  37.8 LV IVS:        0.90 cm      LV e' lateral:   4.35 cm/s LVOT diam:     2.30 cm      LV E/e' lateral: 24.6 LV SV:         33 LV SV Index:   20 LVOT Area:     4.15 cm  LV Volumes (MOD) LV vol d, MOD A2C: 114.0 ml LV vol d, MOD A4C: 113.0 ml LV vol s, MOD A2C: 70.4 ml LV vol s, MOD A4C: 72.6 ml LV SV MOD A2C:     43.6 ml LV SV MOD A4C:     113.0 ml LV SV MOD BP:      37.4 ml RIGHT VENTRICLE RV Basal diam:  2.90 cm LEFT ATRIUM             Index       RIGHT ATRIUM           Index LA diam:        3.80 cm 2.25 cm/m  RA Area:     11.60 cm LA Vol (A2C):   43.7 ml 25.82 ml/m RA Volume:   28.90 ml  17.07 ml/m LA Vol (A4C):   47.5 ml 28.06 ml/m LA Biplane Vol: 48.7 ml 28.77 ml/m  AORTIC VALVE                   PULMONIC VALVE AV  Area (Vmax):    2.72 cm    PV Vmax:       0.93 m/s AV Area (Vmean):   2.64 cm    PV Vmean:      52.300 cm/s AV Area (VTI):     2.54 cm    PV VTI:        0.117 m AV Vmax:           83.80 cm/s  PV Peak grad:  3.5 mmHg AV Vmean:           53.800 cm/s PV Mean grad:  1.0 mmHg AV VTI:            0.131 m AV Peak Grad:      2.8 mmHg AV Mean Grad:      1.0 mmHg LVOT Vmax:         54.80 cm/s LVOT Vmean:        34.200 cm/s LVOT VTI:          0.080 m LVOT/AV VTI ratio: 0.61  AORTA Ao Root diam: 3.10 cm MITRAL VALVE                TRICUSPID VALVE MV Area (PHT): 5.61 cm     TR Peak grad:   41.0 mmHg MV Area VTI:   1.80 cm     TR Vmax:        320.00 cm/s MV Peak grad:  4.8 mmHg MV Mean grad:  2.0 mmHg     SHUNTS MV Vmax:       1.09 m/s     Systemic VTI:  0.08 m MV Vmean:      64.8 cm/s    Systemic Diam: 2.30 cm MV Decel Time: 135 msec MV E velocity: 107.00 cm/s Ida Rogue MD Electronically signed by Ida Rogue MD Signature Date/Time: 07/02/2020/2:55:42 PM    Final         Scheduled Meds: . benzonatate  100 mg Oral Q8H  . carvedilol  6.25 mg Per Tube BID WC  . Chlorhexidine Gluconate Cloth  6 each Topical Daily  . feeding supplement (PROSource TF)  45 mL Per Tube TID  . free water  30 mL Per Tube Q4H  . hydrALAZINE  25 mg Oral Q8H  . LORazepam  1 mg Intravenous Once  . methylPREDNISolone (SOLU-MEDROL) injection  16 mg Intravenous Daily  . multivitamin  1 tablet Per Tube QHS  . pantoprazole (PROTONIX) IV  40 mg Intravenous Q12H  . sodium chloride flush  3 mL Intravenous Q12H   Continuous Infusions: . feeding supplement (NEPRO CARB STEADY) 1,000 mL (07/01/20 1838)  . heparin 650 Units/hr (07/01/20 1523)  . piperacillin-tazobactam (ZOSYN)  IV 2.25 g (07/02/20 1152)     LOS: 4 days    Time spent: 30 mins    Wyvonnia Dusky, MD [facility] Pager [phone]  If 7PM-7AM, please contact night-coverage 07/02/2020, 2:58 PM

## 2020-07-02 NOTE — Progress Notes (Signed)
[place], [place] 06/27/20  Subjective:   [name] was admitted to [facility] on 07/01/2020 for weakness and poor appetite. Per daughter, the he also complains of body aches and nausea.  Covid -44 +  Daughter at bedside Patient witnessed today with eye closed, mouth open, not responding to verbal commands. Currently receiving tube feeds through NGT On a heparin drip   Objective:  Vital signs in last 24 hours:  Temp:  [97.6 F (36.4 C)-99.1 F (37.3 C)] 97.8 F (36.6 C) (02/05 1245) Pulse Rate:  [54-79] 79 (02/05 1245) Resp:  [12-26] 22 (02/05 1245) BP: (101-168)/(66-87) 157/76 (02/05 1245) SpO2:  [91 %-100 %] 91 % (02/05 1245)  Weight change:  Filed Weights   06/20/20 0519 06/22/20 0458 06/26/20 0500  Weight: 55.1 kg 55.2 kg 54.9 kg    Intake/Output:    Intake/Output Summary (Last 24 hours) at 06/27/2020 1356 Last data filed at 06/27/2020 1300 Gross per 24 hour  Intake 348 ml  Output 0 ml  Net 348 ml     Physical Exam: General:  Resting in bed, chronically ill-appearing  HEENT normocephalic, dry oral mucosa  Pulm/lungs Clear bilaterally, accessory muscle involvement  CVS/Heart  S1-S2 present, no chest pain  Abdomen:   soft, nontender  Extremities: trace peripheral edema  Neurologic:  Arousable, responds to few yes/no questions  Skin:  No lesions or rashes  Access:  PD cath ,Left IJ Permcath       Basic Metabolic Panel:  Recent Labs  Lab 06/21/20 0416 06/22/20 0510 06/23/20 0559 06/24/20 [id number] 06/24/20 2159 06/25/20 0759 06/26/20 0536 06/27/20 1051  NA 139   < > 143 140 136 137 134*  --   K 4.8   < > 4.5 4.3 3.8 4.3 4.9  --   CL 98   < > 97* 98 95* 95* 93*  --   CO2 19*   < > 22 20* 21* 22 23  --   GLUCOSE 116*   < > 132* 118* 87 83 168*  --   BUN 128*   < > 116* 109* 51* 60* 48*  --   CREATININE 15.49*   < > 15.88* 16.30* 8.46* 9.52* 6.39*  --   CALCIUM 7.6*   < > 7.7* 7.1* 7.6* 7.6* 8.1*  --   PHOS 9.5*  --   9.1* 10.0*  --   --   --  6.4*   < > = values in this interval not displayed.     CBC: Recent Labs  Lab 06/24/20 0619 06/24/20 2159 06/25/20 0759 06/26/20 0536 06/27/20 0618  WBC 9.2 9.9 9.8 5.5 7.8  NEUTROABS  --  8.7*  --   --   --   HGB 6.9* 8.0* 8.1* 7.7* 7.2*  HCT 20.6* 23.8* 24.0* 23.1* 21.6*  MCV 97.6 99.2 98.0 97.9 97.3  PLT 98* 100* 112* 125* 133*      Lab Results  Component Value Date   HEPBSAG NON REACTIVE 05/06/2020   HEPBSAB NON REACTIVE 09/20/2019   HEPBIGM NON REACTIVE 09/20/2019      Microbiology:  Recent Results (from the past 240 hour(s))  Culture, blood (x 2)     Status: None (Preliminary result)   Collection Time: 06/24/20 10:00 PM   Specimen: BLOOD  Result Value Ref Range Status   Specimen Description BLOOD BLOOD LEFT HAND  Final   Special Requests   Final    BOTTLES DRAWN AEROBIC ONLY Blood Culture results may not be  optimal due to an inadequate volume of blood received in culture bottles   Culture   Final    NO GROWTH 3 DAYS Performed at [facility], [address] [place], [place] [address]    Report Status PENDING  Incomplete  Culture, blood (x 2)     Status: None (Preliminary result)   Collection Time: 06/24/20 10:01 PM   Specimen: BLOOD  Result Value Ref Range Status   Specimen Description BLOOD BLOOD RIGHT HAND  Final   Special Requests   Final    BOTTLES DRAWN AEROBIC ONLY Blood Culture results may not be optimal due to an inadequate volume of blood received in culture bottles   Culture   Final    NO GROWTH 3 DAYS Performed at [facility], [address], [place], [place] [address]    Report Status PENDING  Incomplete  MRSA PCR Screening     Status: None   Collection Time: 06/25/20  6:27 PM   Specimen: Nasopharyngeal  Result Value Ref Range Status   MRSA by PCR NEGATIVE NEGATIVE Final    Comment:        The GeneXpert MRSA Assay (FDA approved for NASAL specimens only), is one component of  a comprehensive MRSA colonization surveillance program. It is not intended to diagnose MRSA infection nor to guide or monitor treatment for MRSA infections. Performed at [facility], [address] [place], [place] [address]     Coagulation Studies: Recent Labs    06/24/20 Aug 16, 2157  LABPROT 14.2  INR 1.1    Urinalysis: No results for input(s): COLORURINE, LABSPEC, PHURINE, GLUCOSEU, HGBUR, BILIRUBINUR, KETONESUR, PROTEINUR, UROBILINOGEN, NITRITE, LEUKOCYTESUR in the last 72 hours.  Invalid input(s): APPERANCEUR    Imaging: No results found.   Medications:   . sodium chloride 50 mL/hr at 06/26/20 0030  . acetaminophen 1,000 mg (06/27/20 0600)  . dialysis solution 1.5% low-MG/low-CA    . piperacillin-tazobactam (ZOSYN)  IV 2.25 g (06/27/20 0858)   . sodium chloride   Intravenous Once  . sodium chloride   Intravenous Once  . atorvastatin  40 mg Oral Daily  . benzonatate  100 mg Oral Q8H  . carvedilol  3.125 mg Oral BID WC  . Chlorhexidine Gluconate Cloth  6 each Topical Daily  . epoetin (EPOGEN/PROCRIT) injection  20,000 Units Subcutaneous Weekly  . gentamicin cream  1 application Topical Daily  . guaiFENesin  600 mg Oral BID  . methylPREDNISolone (SOLU-MEDROL) injection  40 mg Intravenous Daily  . multivitamin  1 tablet Oral QHS  . pantoprazole  40 mg Intravenous Q12H  . polyethylene glycol  17 g Oral Daily   acetaminophen **OR** acetaminophen **OR** acetaminophen, calcium carbonate, guaiFENesin-dextromethorphan, lidocaine, loperamide, ondansetron (ZOFRAN) IV, ondansetron (ZOFRAN) IV  Assessment/ Plan:  75 y.o. male with  end stage renal disease on peritoneal dialysis, hypertension, AAA repair, congestive heart failure, peripheral vascular disease, raynaud's syndrome, hyperlipidemia  was admitted on 06/18/2020 for  Active Problems:   Essential hypertension   HFrEF (heart failure with reduced ejection fraction) (HCC)   End stage renal disease on dialysis  (HCC)   Acute blood loss anemia   [id number]   Toxic metabolic encephalopathy   Lobar pneumonia (HCC)   Sepsis with encephalopathy without septic shock (HCC)  Acute GI bleeding [K92.2] Generalized weakness [R53.1] Gastrointestinal hemorrhage, unspecified gastrointestinal hemorrhage type [K92.2] COVID-19 [U07.1]   [facility] 52.5kg CCPD 9 hours 5 exchanges 2029m fills  #. ESRD Received dialysis yesterday. HR and BP increase  resulted in small amount of fluid removal Next treatment scheduled for tomorrow  #. Failure to thrive Receiving tube feeds at 18m/hr Diarrhea related to tube feeds Spoke with daughter about patient status and poor prognosis.  Full code status maintained  #. Anemia of CKD Hgb currently 8.8. Will continue EPO with HD treatments  # Secondary hyperparathyroidism   No results found for: PTH Currently receiving HD in hospital to improve management of these levels  #Covid -19 positive pneumonia Remains on isolation for this diagnosis Managed by primary team   SColon Flattery NP [facility]

## 2020-07-02 NOTE — Progress Notes (Signed)
GOALS OF CARE DISCUSSION  The Clinical status was relayed to family in detail. Daughters and Son  Updated and notified of patients medical condition.  Patient remains unresponsive and will not open eyes to command.    Patient with increased WOB and using accessory muscles to breathe Explained to family course of therapy and the modalities     Patient with Progressive multiorgan failure with a very high probablity of a very minimal chance of meaningful recovery despite all aggressive and optimal medical therapy.  Process associated with Suffering.  Family understands the situation.  They have consented and agreed to DNR/DNI  Family are satisfied with Plan of action and management. All questions answered  Additional CC time 32 mins   Veola Cafaro Patricia Pesa, M.D.  [facility]  Medical Director [place] Director [facility] Cardio-Pulmonary Department

## 2020-07-02 NOTE — Progress Notes (Signed)
Pt Lactic Acid is 2.0. Dr. Jimmye Norman made aware.

## 2020-07-02 NOTE — Progress Notes (Signed)
OR for support to this family. Met with family in [place]. Family is very supportive and hopeful, during my visit they were waiting for doctor to update them.

## 2020-07-02 NOTE — Progress Notes (Signed)
   07/02/20 1156  Assess: MEWS Score  Temp 98 F (36.7 C)  BP (!) 175/99  Pulse Rate 87  Resp 20  SpO2 98 %  O2 Device Nasal Cannula  O2 Flow Rate (L/min) 2 L/min   Went to check on pt. Family is currently not at bedside. Checked pt to see if he had a BM. Pt was clean. Swabbed pt mouth and applied lip moisturizer to the patient lips.

## 2020-07-03 ENCOUNTER — Ambulatory Visit: Payer: Medicare Other | Admitting: Family

## 2020-07-03 DIAGNOSIS — R778 Other specified abnormalities of plasma proteins: Secondary | ICD-10-CM

## 2020-07-03 DIAGNOSIS — Z7189 Other specified counseling: Secondary | ICD-10-CM

## 2020-07-03 DIAGNOSIS — D649 Anemia, unspecified: Secondary | ICD-10-CM | POA: Diagnosis not present

## 2020-07-03 DIAGNOSIS — R627 Adult failure to thrive: Secondary | ICD-10-CM | POA: Diagnosis not present

## 2020-07-03 DIAGNOSIS — U071 COVID-19: Secondary | ICD-10-CM | POA: Diagnosis not present

## 2020-07-03 DIAGNOSIS — I5022 Chronic systolic (congestive) heart failure: Secondary | ICD-10-CM

## 2020-07-03 DIAGNOSIS — Z515 Encounter for palliative care: Secondary | ICD-10-CM

## 2020-07-03 DIAGNOSIS — N186 End stage renal disease: Secondary | ICD-10-CM | POA: Diagnosis not present

## 2020-07-03 DIAGNOSIS — I214 Non-ST elevation (NSTEMI) myocardial infarction: Secondary | ICD-10-CM | POA: Diagnosis not present

## 2020-07-03 DIAGNOSIS — J1282 Pneumonia due to coronavirus disease 2019: Secondary | ICD-10-CM | POA: Diagnosis not present

## 2020-07-03 LAB — BASIC METABOLIC PANEL
Anion gap: 23 — ABNORMAL HIGH (ref 5–15)
BUN: 121 mg/dL — ABNORMAL HIGH (ref 8–23)
CO2: 16 mmol/L — ABNORMAL LOW (ref 22–32)
Calcium: 6.7 mg/dL — ABNORMAL LOW (ref 8.9–10.3)
Chloride: 98 mmol/L (ref 98–111)
Creatinine, Ser: 8.06 mg/dL — ABNORMAL HIGH (ref 0.61–1.24)
GFR, Estimated: 6 mL/min — ABNORMAL LOW (ref >60)
Glucose, Bld: 415 mg/dL — ABNORMAL HIGH (ref 70–99)
Potassium: 3.2 mmol/L — ABNORMAL LOW (ref 3.5–5.1)
Sodium: 137 mmol/L (ref 135–145)

## 2020-07-03 LAB — CULTURE, BLOOD (ROUTINE X 2)
Culture: NO GROWTH
Culture: NO GROWTH

## 2020-07-03 LAB — GLUCOSE, CAPILLARY
Glucose-Capillary: 137 mg/dL — ABNORMAL HIGH (ref 70–99)
Glucose-Capillary: 139 mg/dL — ABNORMAL HIGH (ref 70–99)
Glucose-Capillary: 147 mg/dL — ABNORMAL HIGH (ref 70–99)
Glucose-Capillary: 155 mg/dL — ABNORMAL HIGH (ref 70–99)
Glucose-Capillary: 205 mg/dL — ABNORMAL HIGH (ref 70–99)
Glucose-Capillary: 358 mg/dL — ABNORMAL HIGH (ref 70–99)
Glucose-Capillary: 376 mg/dL — ABNORMAL HIGH (ref 70–99)
Glucose-Capillary: 380 mg/dL — ABNORMAL HIGH (ref 70–99)

## 2020-07-03 LAB — CBC
HCT: 22.6 % — ABNORMAL LOW (ref 39.0–52.0)
Hemoglobin: 7.7 g/dL — ABNORMAL LOW (ref 13.0–17.0)
MCH: 33 pg (ref 26.0–34.0)
MCHC: 34.1 g/dL (ref 30.0–36.0)
MCV: 97 fL (ref 80.0–100.0)
Platelets: 124 10*3/uL — ABNORMAL LOW (ref 150–400)
RBC: 2.33 MIL/uL — ABNORMAL LOW (ref 4.22–5.81)
RDW: 18 % — ABNORMAL HIGH (ref 11.5–15.5)
WBC: 9.3 10*3/uL (ref 4.0–10.5)
nRBC: 6.4 % — ABNORMAL HIGH (ref 0.0–0.2)

## 2020-07-03 LAB — HEMOGLOBIN AND HEMATOCRIT, BLOOD
HCT: 19.5 % — ABNORMAL LOW (ref 39.0–52.0)
Hemoglobin: 6.6 g/dL — ABNORMAL LOW (ref 13.0–17.0)

## 2020-07-03 LAB — BETA-HYDROXYBUTYRIC ACID: Beta-Hydroxybutyric Acid: 0.2 mmol/L (ref 0.05–0.27)

## 2020-07-03 LAB — ALBUMIN: Albumin: 2.3 g/dL — ABNORMAL LOW (ref 3.5–5.0)

## 2020-07-03 LAB — HEPARIN LEVEL (UNFRACTIONATED): Heparin Unfractionated: 0.45 IU/mL (ref 0.30–0.70)

## 2020-07-03 LAB — OSMOLALITY: Osmolality: 361 mOsm/kg (ref 275–295)

## 2020-07-03 MED ORDER — LOPERAMIDE HCL 2 MG PO CAPS
2.0000 mg | ORAL_CAPSULE | Freq: Once | ORAL | Status: AC
  Administered 2020-07-03: 2 mg via ORAL

## 2020-07-03 MED ORDER — HEPARIN SODIUM (PORCINE) 5000 UNIT/ML IJ SOLN
5000.0000 [IU] | Freq: Three times a day (TID) | INTRAMUSCULAR | Status: DC
  Administered 2020-07-03 – 2020-07-04 (×5): 5000 [IU] via SUBCUTANEOUS
  Filled 2020-07-03 (×5): qty 1

## 2020-07-03 MED ORDER — PROSOURCE TF PO LIQD
45.0000 mL | Freq: Two times a day (BID) | ORAL | Status: DC
  Administered 2020-07-04 (×2): 45 mL
  Filled 2020-07-03 (×3): qty 45

## 2020-07-03 MED ORDER — SODIUM CHLORIDE 0.9% IV SOLUTION: Freq: Once | INTRAVENOUS | Status: DC

## 2020-07-03 MED ORDER — NEPRO/CARBSTEADY PO LIQD
1000.0000 mL | ORAL | Status: DC
  Administered 2020-07-04: 1000 mL via ORAL

## 2020-07-03 MED ORDER — PIPERACILLIN-TAZOBACTAM IN DEX 2-0.25 GM/50ML IV SOLN
2.2500 g | Freq: Three times a day (TID) | INTRAVENOUS | Status: DC
  Administered 2020-07-03 – 2020-07-04 (×5): 2.25 g via INTRAVENOUS
  Filled 2020-07-03 (×7): qty 50

## 2020-07-03 MED ORDER — INSULIN ASPART 100 UNIT/ML ~~LOC~~ SOLN
0.0000 [IU] | SUBCUTANEOUS | Status: DC
  Administered 2020-07-03 (×3): 1 [IU] via SUBCUTANEOUS
  Administered 2020-07-03: 3 [IU] via SUBCUTANEOUS
  Administered 2020-07-04: 2 [IU] via SUBCUTANEOUS
  Administered 2020-07-04: 1 [IU] via SUBCUTANEOUS
  Administered 2020-07-04: 3 [IU] via SUBCUTANEOUS
  Administered 2020-07-05: 2 [IU] via SUBCUTANEOUS
  Filled 2020-07-03 (×8): qty 1

## 2020-07-03 MED ORDER — SODIUM BICARBONATE 650 MG PO TABS
1300.0000 mg | ORAL_TABLET | Freq: Two times a day (BID) | ORAL | Status: DC
  Administered 2020-07-03 – 2020-07-04 (×5): 1300 mg
  Filled 2020-07-03 (×5): qty 2

## 2020-07-03 MED ORDER — GUAIFENESIN-DM 100-10 MG/5ML PO SYRP
5.0000 mL | ORAL_SOLUTION | ORAL | Status: DC | PRN
  Administered 2020-07-03 – 2020-07-04 (×4): 5 mL via ORAL
  Filled 2020-07-03 (×4): qty 5

## 2020-07-03 MED ORDER — PHENOL 1.4 % MT LIQD
1.0000 | OROMUCOSAL | Status: DC | PRN
  Administered 2020-07-03: 1 via OROMUCOSAL
  Filled 2020-07-03: qty 177

## 2020-07-03 MED ORDER — LOPERAMIDE HCL 2 MG PO CAPS
4.0000 mg | ORAL_CAPSULE | ORAL | Status: DC | PRN
  Administered 2020-07-03 – 2020-07-05 (×2): 4 mg via ORAL
  Filled 2020-07-03 (×2): qty 2

## 2020-07-03 MED ORDER — INSULIN ASPART 100 UNIT/ML ~~LOC~~ SOLN
10.0000 [IU] | Freq: Once | SUBCUTANEOUS | Status: AC
  Administered 2020-07-03: 10 [IU] via SUBCUTANEOUS
  Filled 2020-07-03: qty 1

## 2020-07-03 NOTE — Progress Notes (Signed)
Inpatient Diabetes Program Recommendations  AACE/ADA: New Consensus Statement on Inpatient Glycemic Control (2015)  Target Ranges:  Prepandial:   less than 140 mg/dL      Peak postprandial:   less than 180 mg/dL (1-2 hours)      Critically ill patients:  140 - 180 mg/dL   Results for Scott Oconnor, Scott Oconnor (MRN [id number]) as of 07/03/2020 07:03  Ref. Range 07/02/2020 08:21 07/02/2020 12:27 07/02/2020 16:04 07/02/2020 21:36  Glucose-Capillary Latest Ref Range: 70 - 99 mg/dL 192 (H) 243 (H) 261 (H) 281 (H)   Results for Scott Oconnor, Scott Oconnor (MRN [id number]) as of 07/03/2020 07:03  Ref. Range 07/03/2020 00:30 07/03/2020 01:41 07/03/2020 03:31  Glucose-Capillary Latest Ref Range: 70 - 99 mg/dL 358 (H) 380 (H) 376 (H)  10 units NOVOLOG     No History of Diabetes noted.  Nepro tube feeds running 45 cc/hr  Getting Solumedrol 16 mg Daily    MD- If within goals of care for this patient, please consider initiating Novolog SSI Q4 hours  Recommend Novolog 0-6 units (Very Sensitive scale) Q4 hours per the Glycemic Control order set     --Will follow patient during hospitalization--  Wyn Quaker RN, MSN, CDE Diabetes Coordinator Inpatient Glycemic Control Team Team Pager: [phone] (8a-5p)

## 2020-07-03 NOTE — Consult Note (Signed)
[place] for heparin  Indication: chest pain/ACS  Allergies  Allergen Reactions  . Ivp Dye [Iodinated Diagnostic Agents]     ESRD patient     Patient Measurements: Height: '5\' 10"'$  (177.8 cm) Weight: 55.4 kg (122 lb 2.2 oz) IBW/kg (Calculated) : 73 Heparin Dosing Weight: 55 kg  Vital Signs: Temp: 98.1 F (36.7 C) (02/11 0526) Temp Source: Oral (02/11 0526) BP: 154/92 (02/11 0526) Pulse Rate: 92 (02/11 0526)  Labs: Recent Labs    07/01/20 0515 07/01/20 1255 07/01/20 1518 07/01/20 2105 07/01/20 2258 07/02/20 0648 07/02/20 1816 07/02/20 2054 07/03/20 0141  HGB 10.5*  --   --   --   --  8.8* 8.4* 7.9* 7.7*  HCT 31.0*  --   --   --   --  26.3*  --  23.4* 22.6*  PLT 124*  --   --   --   --  134*  --   --  124*  APTT  --   --  47*  --   --   --   --   --   --   LABPROT  --   --  17.7*  --   --   --   --   --   --   INR  --   --  1.5*  --   --   --   --   --   --   HEPARINUNFRC  --   --   --   --  0.41 0.31  --   --  0.45  CREATININE 6.03*  --   --   --   --  8.66*  --   --  8.06*  TROPONINIHS  --    < > 1,207* 988* 905*  --  899*  --   --    < > = values in this interval not displayed.    Estimated Creatinine Clearance: 6.4 mL/min (A) (by C-G formula based on SCr of 8.06 mg/dL (H)).   Medical History: Past Medical History:  Diagnosis Date  . Hyperlipidemia 07/2019  . Hypertension 07/2019  . PVD (peripheral vascular disease) ([place]) 07/2019  . Renal disorder 07/2019   ESRD  . 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 He returned due to continued weakness and poor PO intake since discharge. His EKG on 2/9 shows acute changes and was  found to have a troponin elevated to 1196. Pharmacy consulted to start heparin.  Heparin Dosing Weight: 55 kg Hgb: 10.5 > 8.8 and plts: 124 > 134  Date Time HL Rate/comment 02/09 2258 0.41 Therapeutic 02/10 0645 0.31 Therapeutic.  02/11 01411 0.45 Therapeutic    Goal of Therapy:  Heparin level 0.3-0.7 units/ml Monitor platelets by anticoagulation protocol: Yes   Plan:   Heparin level is therapeutic. Will continue current rate at 650 units/hr. Recheck heparin level and CBC with AM labs unless heparin discontinued after 48 hours.   Renda Rolls, PharmD, [facility] 07/03/2020 6:04 AM

## 2020-07-03 NOTE — Progress Notes (Addendum)
PROGRESS NOTE    Scott Oconnor  [id number] DOB: [birth year] DOA: 07/06/2020 PCP: [name], MD    Assessment & Plan:   Active Problems:   Failure to thrive in adult   Non-ST elevation (NSTEMI) myocardial infarction [facility])  NSTEMI: troponins elevated but trending down. Continue on coreg, hydralazine & isosorbide dinitrate. D/c IV heparin as H&H is dropping. Echo shows EF 20-25%, grade II diastolic dysfunction & global hypokinesis. Continue on tele. Cardio following and recs apprec  Severe sepsis: secondary to COVID-19 & bacterial pneumonia. Completed remdesivir & steroid course. Continue on IV zosyn x 7 days. Pro-cal 8.03. Encourage incentive spirometry   Recent GI bleed: eliquis has been d/c. H&H continues to trend down. Repeat H&H ordered. Will transfuse if Hb <7.0. Fecal occult positive. Not likely a EGD/colonoscopy candidate. GI following and recs apprec   Metabolic encephalopathy: labile, likely secondary to all above & below. Continue w/ supportive care   ESRD: on PD at home. Continue on dialysis as per nephro  Failure to thrive: w/ poor p.o. intake. Has been declining since 75th January, as per daughter. Continue w/ tube feeds   Hyperglycemia: no hx of DM, likely secondary to tube feeds & steroids. Completed steroid course today. Started on SSI w/ accuchecks  Thrombocytopenia: etiology unclear, possibly secondary to [place]. Will continue to monitor   ACD: likely secondary to ESRD and GI bleed.   Underweight: BMI 17.5. Continue w/ nutritional supplements   DVT prophylaxis: SCDs Code Status: DNR/DNI Family Communication: discussed pt's care w/ pt's children at bedside and answered their  questions  Disposition Plan: unclear.   Level of care: Progressive Cardiac   Status is: Inpatient  Remains inpatient appropriate because:Altered mental status, IV treatments appropriate due to intensity of illness or inability to take PO and Inpatient level of care  appropriate due to severity of illness   Dispo: The patient is from: Home              Anticipated d/c is to: Home              Anticipated d/c date is: > 3 days              Patient currently is not medically stable to d/c.   Difficult to place patient No     Consultants:   nephro    Procedures:    Antimicrobials: zosyn    Subjective: Pt is still lethargic  Objective: Vitals:   07/02/20 1502 07/03/20 0119 07/03/20 0521 07/03/20 0526  BP: (!) 175/127 (!) 146/99 (!) 154/98 (!) 154/92  Pulse: 94 92  92  Resp: 20     Temp: 97.8 F (36.6 C)   98.1 F (36.7 C)  TempSrc: Oral   Oral  SpO2: 98% 100%    Weight:      Height:        Intake/Output Summary (Last 24 hours) at 07/03/2020 0820 Last data filed at 07/02/2020 1800 Gross per 24 hour  Intake 1840.52 ml  Output --  Net 1840.52 ml   Filed Weights   06/24/2020 1636 06/29/20 0500 06/30/20 0500  Weight: 55 kg 55 kg 55.4 kg    Examination:  General exam: Appears uncomfortable  Respiratory system: diminished breath sounds b/l  Cardiovascular system: S1 & S2+. No rubs or clicks  Gastrointestinal system: Abd is soft, NT, ND & hyperactive bowel sounds Central nervous system: Lethargic. Moves all 4 extremities  Psychiatry: Judgement and insight appear abnormal     Data Reviewed: I  have personally reviewed following labs and imaging studies  CBC: Recent Labs  Lab 07/14/2020 1725 06/29/20 0213 06/30/20 0515 07/01/20 0515 07/02/20 [id number] 07/02/20 1816 07/02/20 2054 07/03/20 0141  WBC 10.8* 11.1* 6.5 5.3 9.1  --   --  9.3  NEUTROABS 9.1* 9.2*  --   --   --   --   --   --   HGB 10.7* 10.7* 10.6* 10.5* 8.8* 8.4* 7.9* 7.7*  HCT 31.4* 31.9* 31.8* 31.0* 26.3*  --  23.4* 22.6*  MCV 95.7 96.4 96.7 95.1 97.4  --   --  97.0  PLT 128* 134* 112* 124* 134*  --   --  [id number]*   Basic Metabolic Panel: Recent Labs  Lab 06/29/20 0213 06/29/20 0517 06/30/20 0515 06/30/20 1035 06/30/20 1702 07/01/20 0515 07/02/20 0648  07/02/20 1816 07/03/20 0141  NA 135  --  138  --   --  137 140  --  137  K 4.4  --  4.8  --   --  4.0 4.0  --  3.2*  CL 95*  --  97*  --   --  96* 98  --  98  CO2 18*  --  18*  --   --  21* 20*  --  16*  GLUCOSE 123*  --  135*  --   --  197* 228*  --  415*  BUN 71*  --  97*  --   --  62* 105*  --  121*  CREATININE 7.49*  --  9.26*  --   --  6.03* 8.66*  --  8.06*  CALCIUM 7.1*  --  7.7*  --   --  7.8* 7.1*  --  6.7*  MG  --  2.4 2.5*  --  2.2 2.2  --   --   --   PHOS  --  9.0* 12.3* 12.7* 6.9* 8.8*  --  10.8*  --    GFR: Estimated Creatinine Clearance: 6.4 mL/min (A) (by C-G formula based on SCr of 8.06 mg/dL (H)). Liver Function Tests: Recent Labs  Lab 06/29/20 0213 06/30/20 0515 07/01/20 0515 07/03/20 0141  AST 44*  44* 33 39  --   ALT '16  15 14 15  '$ --   ALKPHOS 49  51 42 47  --   BILITOT 1.2  1.2 1.1 1.0  --   PROT 6.0*  6.1* 5.3* 5.5*  --   ALBUMIN 2.7*  2.7* 2.3* 2.5* 2.3*   Recent Labs  Lab 06/29/20 0213  LIPASE 142*   Recent Labs  Lab 06/29/20 0517  AMMONIA 14   Coagulation Profile: Recent Labs  Lab 06/29/20 0213 07/01/20 1518  INR 1.2 1.5*   Cardiac Enzymes: Recent Labs  Lab 07/10/2020 1725  CKTOTAL 341   BNP (last 3 results) No results for input(s): PROBNP in the last 8760 hours. HbA1C: No results for input(s): HGBA1C in the last 72 hours. CBG: Recent Labs  Lab 07/02/20 1604 07/02/20 2136 07/03/20 0030 07/03/20 0141 07/03/20 0331  GLUCAP 261* 281* 358* 380* 376*   Lipid Profile: No results for input(s): CHOL, HDL, LDLCALC, TRIG, CHOLHDL, LDLDIRECT in the last 72 hours. Thyroid Function Tests: No results for input(s): TSH, T4TOTAL, FREET4, T3FREE, THYROIDAB in the last 72 hours. Anemia Panel: No results for input(s): VITAMINB12, FOLATE, FERRITIN, TIBC, IRON, RETICCTPCT in the last 72 hours. Sepsis Labs: Recent Labs  Lab 07/01/2020 1725 06/29/20 0213 06/29/20 0517 07/02/20 1019  LATICACIDVEN 1.5 2.7* 1.8 2.0*  Recent  Results (from the past 240 hour(s))  Culture, blood (x 2)     Status: None   Collection Time: 06/24/20 10:00 PM   Specimen: BLOOD  Result Value Ref Range Status   Specimen Description BLOOD BLOOD LEFT HAND  Final   Special Requests   Final    BOTTLES DRAWN AEROBIC ONLY Blood Culture results may not be optimal due to an inadequate volume of blood received in culture bottles   Culture   Final    NO GROWTH 5 DAYS Performed at [facility], [address] [place], [place] [address]    Report Status 06/29/2020 FINAL  Final  Culture, blood (x 2)     Status: None   Collection Time: 06/24/20 10:01 PM   Specimen: BLOOD  Result Value Ref Range Status   Specimen Description BLOOD BLOOD RIGHT HAND  Final   Special Requests   Final    BOTTLES DRAWN AEROBIC ONLY Blood Culture results may not be optimal due to an inadequate volume of blood received in culture bottles   Culture   Final    NO GROWTH 5 DAYS Performed at [facility], [address] [place], [place] [address]    Report Status 06/29/2020 FINAL  Final  MRSA PCR Screening     Status: None   Collection Time: 06/25/20  6:27 PM   Specimen: Nasopharyngeal  Result Value Ref Range Status   MRSA by PCR NEGATIVE NEGATIVE Final    Comment:        The GeneXpert MRSA Assay (FDA approved for NASAL specimens only), is one component of a comprehensive MRSA colonization surveillance program. It is not intended to diagnose MRSA infection nor to guide or monitor treatment for MRSA infections. Performed at [facility], [address] [place], [place] [address]   Blood culture (routine x 2)     Status: None   Collection Time: 07/06/2020  5:26 PM   Specimen: BLOOD  Result Value Ref Range Status   Specimen Description BLOOD RIGHT ANTECUBITAL  Final   Special Requests   Final    BOTTLES DRAWN AEROBIC AND ANAEROBIC Blood Culture results may not be optimal due to an inadequate volume of blood received in culture  bottles   Culture   Final    NO GROWTH 5 DAYS Performed at [facility], [address] [place], [place] [address]    Report Status 07/03/2020 FINAL  Final  Blood culture (routine x 2)     Status: None   Collection Time: 07/10/2020  5:26 PM   Specimen: BLOOD  Result Value Ref Range Status   Specimen Description BLOOD LEFT ANTECUBITAL  Final   Special Requests   Final    BOTTLES DRAWN AEROBIC AND ANAEROBIC Blood Culture results may not be optimal due to an inadequate volume of blood received in culture bottles   Culture   Final    NO GROWTH 5 DAYS Performed at [facility], [address], [place],  [address]    Report Status 07/03/2020 FINAL  Final  C Difficile Quick Screen w PCR reflex     Status: None   Collection Time: 06/30/20  5:08 PM   Specimen: STOOL  Result Value Ref Range Status   C Diff antigen NEGATIVE NEGATIVE Final   C Diff toxin NEGATIVE NEGATIVE Final   C Diff interpretation No C. difficile detected.  Final    Comment: Performed at [facility], [address] [place], [place]  [address]         Radiology Studies: US Venous Img Upper Uni Right(DVT)  Result Date: 07/02/2020 CLINICAL DATA:  75 year old male with right arm swelling. EXAM: RIGHT UPPER EXTREMITY VENOUS DOPPLER ULTRASOUND TECHNIQUE: Gray-scale sonography with graded compression, as well as color Doppler and duplex ultrasound were performed to evaluate the upper extremity deep venous system from the level of the subclavian vein and including the jugular, axillary, basilic, radial, ulnar and upper cephalic vein. Spectral Doppler was utilized to evaluate flow at rest and with distal augmentation maneuvers. COMPARISON:  None. FINDINGS: Contralateral Subclavian Vein: Not well visualized due to bandages from dialysis access. Internal Jugular Vein: Limited visualization. No evidence of thrombus. Normal compressibility, respiratory phasicity and response to augmentation.  Subclavian Vein: No evidence of thrombus. Normal compressibility, respiratory phasicity and response to augmentation. Axillary Vein: Limited visualization.  No evidence of thrombus. Cephalic Vein: No evidence of thrombus. Normal compressibility, respiratory phasicity and response to augmentation. Basilic Vein: No evidence of thrombus. Normal compressibility, respiratory phasicity and response to augmentation. Brachial Veins: No evidence of thrombus. Normal compressibility, respiratory phasicity and response to augmentation. Radial Veins: No evidence of thrombus. Normal compressibility, respiratory phasicity and response to augmentation. Ulnar Veins: No evidence of thrombus. Normal compressibility, respiratory phasicity and response to augmentation. Other Findings:  None visualized. IMPRESSION: No evidence of right upper extremity deep vein thrombosis. Limited visualization of the right internal jugular and right axillary veins. Ruthann Cancer, MD [facility] Radiology Electronically Signed   By: Ruthann Cancer MD   On: 07/02/2020 11:51   DG Chest Port 1 View  Result Date: 07/01/2020 CLINICAL DATA:  Fluid overload EXAM: PORTABLE CHEST 1 VIEW COMPARISON:  06/30/2020 FINDINGS: Cardiac shadow is within normal limits. Aortic calcifications are seen. Gastric catheter and dialysis catheter are noted in satisfactory position. Increased left basilar infiltrate with associated effusion is noted. No other focal parenchymal abnormality is noted. IMPRESSION: Increasing consolidation in the left base with associated small effusion. Electronically Signed   By: Inez Catalina M.D.   On: 07/01/2020 12:36   ECHOCARDIOGRAM COMPLETE  Result Date: 07/02/2020    ECHOCARDIOGRAM REPORT   Patient Name:   Scott Oconnor Date of Exam: 07/02/2020 Medical Rec #:  [id number]         Height:       70.0 in Accession #:    [id number]        Weight:       122.1 lb Date of Birth:  November 23, 1945          BSA:          1.693 m Patient Age:    75 years          BP:           183/113 mmHg Patient Gender: M                 HR:           99 bpm. Exam Location:  [facility] Procedure: 2D Echo, Color Doppler and Cardiac Doppler Indications:     I21.4 NSTEMI  History:         Patient has prior history of Echocardiogram examinations, most                  recent 01/29/2020. PVD, CKD; Risk Factors:Dyslipidemia and                  Hypertension. Pt tested positive for COVID-19 on 06/18/20.  Sonographer:     Charmayne Sheer RDCS (AE) Referring Phys:  [id number] Kate Sable Diagnosing Phys: Ida Rogue MD  Sonographer Comments: Suboptimal subcostal window. IMPRESSIONS  1. Left ventricular ejection fraction, by estimation, is 20 to 25%. The left ventricle has severely decreased function. The left ventricle demonstrates global hypokinesis, basal regions best preserved. Unable to exclude component of stress cardiomyopathy. The left ventricular internal cavity size was mildly dilated. Left ventricular diastolic parameters are consistent with Grade II diastolic dysfunction (pseudonormalization).  2. Right ventricular systolic function is normal. The right ventricular size is normal.  3. Mild mitral valve regurgitation.  4. Aortic valve regurgitation is mild to moderate. FINDINGS  Left Ventricle: Left ventricular ejection fraction, by estimation, is 20 to 25%. The left ventricle has severely decreased function. The left ventricle demonstrates global hypokinesis. The left ventricular internal cavity size was mildly dilated. There is no left ventricular hypertrophy. Left ventricular diastolic parameters are consistent with Grade II diastolic dysfunction (pseudonormalization). Right Ventricle: The right ventricular size is normal. No increase in right ventricular wall thickness. Right ventricular systolic function is normal. Left Atrium: Left atrial size was normal in size. Right Atrium: Right atrial size was normal in size. Pericardium: There  is no evidence of pericardial effusion. Mitral Valve: The mitral valve is normal in structure. Mild mitral annular calcification. Mild mitral valve regurgitation. No evidence of mitral valve stenosis. MV peak gradient, 4.8 mmHg. The mean mitral valve gradient is 2.0 mmHg. Tricuspid Valve: The tricuspid valve is normal in structure. Tricuspid valve regurgitation is mild . No evidence of tricuspid stenosis. Aortic Valve: The aortic valve is normal in structure. Aortic valve regurgitation is mild to moderate. No aortic stenosis is present. Aortic valve mean gradient measures 1.0 mmHg. Aortic valve peak gradient measures 2.8 mmHg. Aortic valve area, by VTI measures 2.54 cm. Pulmonic Valve: The pulmonic valve was normal in structure. Pulmonic valve regurgitation is mild. No evidence of pulmonic stenosis. Aorta: The aortic root is normal in size and structure. Venous: The inferior vena cava is normal in size with greater than 50% respiratory variability, suggesting right atrial pressure of 3 mmHg. IAS/Shunts: No atrial level shunt detected by color flow Doppler.  LEFT VENTRICLE PLAX 2D LVIDd:         4.90 cm      Diastology LVIDs:         4.00 cm      LV e' medial:    2.83 cm/s LV PW:         1.00 cm      LV E/e' medial:  37.8 LV IVS:        0.90 cm      LV e' lateral:   4.35 cm/s LVOT diam:     2.30 cm      LV E/e' lateral: 24.6 LV SV:         33 LV SV Index:   20 LVOT Area:     4.15 cm  LV Volumes (MOD) LV vol d, MOD A2C: 114.0 ml LV vol d, MOD A4C: 113.0 ml LV vol s, MOD A2C: 70.4 ml LV vol s, MOD A4C: 72.6 ml LV SV MOD A2C:     43.6 ml LV SV MOD A4C:     113.0 ml LV SV MOD BP:      37.4 ml RIGHT VENTRICLE RV Basal diam:  2.90 cm LEFT ATRIUM             Index  RIGHT ATRIUM           Index LA diam:        3.80 cm 2.25 cm/m  RA Area:     11.60 cm LA Vol (A2C):   43.7 ml 25.82 ml/m RA Volume:   28.90 ml  17.07 ml/m LA Vol (A4C):   47.5 ml 28.06 ml/m LA Biplane Vol: 48.7 ml 28.77 ml/m  AORTIC VALVE                    PULMONIC VALVE AV Area (Vmax):    2.72 cm    PV Vmax:       0.93 m/s AV Area (Vmean):   2.64 cm    PV Vmean:      52.300 cm/s AV Area (VTI):     2.54 cm    PV VTI:        0.117 m AV Vmax:           83.80 cm/s  PV Peak grad:  3.5 mmHg AV Vmean:          53.800 cm/s PV Mean grad:  1.0 mmHg AV VTI:            0.131 m AV Peak Grad:      2.8 mmHg AV Mean Grad:      1.0 mmHg LVOT Vmax:         54.80 cm/s LVOT Vmean:        34.200 cm/s LVOT VTI:          0.080 m LVOT/AV VTI ratio: 0.61  AORTA Ao Root diam: 3.10 cm MITRAL VALVE                TRICUSPID VALVE MV Area (PHT): 5.61 cm     TR Peak grad:   41.0 mmHg MV Area VTI:   1.80 cm     TR Vmax:        320.00 cm/s MV Peak grad:  4.8 mmHg MV Mean grad:  2.0 mmHg     SHUNTS MV Vmax:       1.09 m/s     Systemic VTI:  0.08 m MV Vmean:      64.8 cm/s    Systemic Diam: 2.30 cm MV Decel Time: 135 msec MV E velocity: 107.00 cm/s Ida Rogue MD Electronically signed by Ida Rogue MD Signature Date/Time: 07/02/2020/2:55:42 PM    Final         Scheduled Meds: . benzonatate  100 mg Oral Q8H  . carvedilol  6.25 mg Per Tube BID WC  . Chlorhexidine Gluconate Cloth  6 each Topical Daily  . feeding supplement (PROSource TF)  45 mL Per Tube TID  . fiber  1 packet Per Tube TID  . free water  30 mL Per Tube Q4H  . gentamicin cream  1 application Topical Daily  . Gerhardt's butt cream   Topical TID  . hydrALAZINE  50 mg Oral Q8H  . insulin aspart  0-9 Units Subcutaneous Q4H  . isosorbide dinitrate  20 mg Oral TID  . LORazepam  1 mg Intravenous Once  . methylPREDNISolone (SOLU-MEDROL) injection  16 mg Intravenous Daily  . multivitamin  1 tablet Per Tube QHS  . pantoprazole (PROTONIX) IV  40 mg Intravenous Q12H  . sodium bicarbonate  1,300 mg Per Tube BID  . sodium chloride flush  3 mL Intravenous Q12H   Continuous Infusions: . dialysis solution 1.5% low-MG/low-CA    . feeding supplement (NEPRO CARB  STEADY) 1,000 mL (07/02/20 1727)  . heparin 650  Units/hr (07/03/20 0147)     LOS: 5 days    Time spent: 30 mins    Wyvonnia Dusky, MD [facility] Pager [phone]  If 7PM-7AM, please contact night-coverage 07/03/2020, 8:20 AM

## 2020-07-03 NOTE — Progress Notes (Signed)
Progress Note  Patient Name: Scott Oconnor Date of Encounter: 07/03/2020  [facility] Cardiologist: Ida Rogue, MD  Subjective   Patient awake but does not respond to voice or answer questions.  Inpatient Medications    Scheduled Meds: . benzonatate  100 mg Oral Q8H  . carvedilol  6.25 mg Per Tube BID WC  . Chlorhexidine Gluconate Cloth  6 each Topical Daily  . fiber  1 packet Per Tube TID  . free water  30 mL Per Tube Q4H  . gentamicin cream  1 application Topical Daily  . Gerhardt's butt cream   Topical TID  . heparin  5,000 Units Subcutaneous Q8H  . hydrALAZINE  50 mg Oral Q8H  . insulin aspart  0-9 Units Subcutaneous Q4H  . isosorbide dinitrate  20 mg Oral TID  . LORazepam  1 mg Intravenous Once  . multivitamin  1 tablet Per Tube QHS  . pantoprazole (PROTONIX) IV  40 mg Intravenous Q12H  . sodium bicarbonate  1,300 mg Per Tube BID  . sodium chloride flush  3 mL Intravenous Q12H   Continuous Infusions: . dialysis solution 1.5% low-MG/low-CA    . piperacillin-tazobactam (ZOSYN)  IV 2.25 g (07/03/20 1641)   PRN Meds: acetaminophen (TYLENOL) oral liquid 160 mg/5 mL, albuterol, guaiFENesin-dextromethorphan, hydrALAZINE, loperamide, morphine injection, nitroGLYCERIN, ondansetron **OR** ondansetron (ZOFRAN) IV, phenol   Vital Signs    Vitals:   07/03/20 0526 07/03/20 0820 07/03/20 1221 07/03/20 1617  BP: (!) 154/92 (!) 185/98 (!) 141/81 (!) 153/83  Pulse: 92 88 85 92  Resp:  '18 16 17  '$ Temp: 98.1 F (36.7 C) 98.4 F (36.9 C) 98.6 F (37 C) 98 F (36.7 C)  TempSrc: Oral Oral Oral Oral  SpO2:  97% 95% 94%  Weight:      Height:        Intake/Output Summary (Last 24 hours) at 07/03/2020 1642 Last data filed at 07/03/2020 1251 Gross per 24 hour  Intake 3085.83 ml  Output --  Net 3085.83 ml   Last 3 Weights 06/30/2020 06/29/2020 07/04/2020  Weight (lbs) 122 lb 2.2 oz 121 lb 4.1 oz 121 lb 4.1 oz  Weight (kg) 55.4 kg 55 kg 55 kg      Telemetry    Sinus  rhythm with PVCs - Personally Reviewed  ECG    No new tracing  Physical Exam   GEN:  Chronically ill-appearing man lying in bed.  He does not make eye contact or respond to questions. Neck:  JVP 6-8 cm. Cardiac: RRR, no murmurs, rubs, or gallops.  Respiratory:  Poor inspiratory effort with diminished breath sounds anteriorly. GI: Soft, nontender, non-distended  MS: No edema; No deformity. Neuro:   Awake but does not follow commands or answer questions.  Does not move. Psych: Unable to assess due to lethargy.  Labs    High Sensitivity Troponin:   Recent Labs  Lab 07/01/20 1255 07/01/20 1518 07/01/20 2105 07/01/20 2258 07/02/20 1816  TROPONINIHS 1,196* 1,207* 988* 905* 899*      Chemistry Recent Labs  Lab 06/29/20 [id number] 06/30/20 0515 07/01/20 0515 07/02/20 0648 07/03/20 0141  NA 135 138 137 140 137  K 4.4 4.8 4.0 4.0 3.2*  CL 95* 97* 96* 98 98  CO2 18* 18* 21* 20* 16*  GLUCOSE 123* 135* 197* 228* 415*  BUN 71* 97* 62* 105* 121*  CREATININE 7.49* 9.26* 6.03* 8.66* 8.06*  CALCIUM 7.1* 7.7* 7.8* 7.1* 6.7*  PROT 6.0*  6.1* 5.3* 5.5*  --   --  ALBUMIN 2.7*  2.7* 2.3* 2.5*  --  2.3*  AST 44*  44* 33 39  --   --   ALT '16  15 14 15  '$ --   --   ALKPHOS 49  51 42 47  --   --   BILITOT 1.2  1.2 1.1 1.0  --   --   GFRNONAA 7* 5* 9* 6* 6*  ANIONGAP 22* 23* 20* 22* 23*     Hematology Recent Labs  Lab 07/01/20 0515 07/02/20 0648 07/02/20 1816 07/02/20 2054 07/03/20 0141  WBC 5.3 9.1  --   --  9.3  RBC 3.26* 2.70*  --   --  2.33*  HGB 10.5* 8.8* 8.4* 7.9* 7.7*  HCT 31.0* 26.3*  --  23.4* 22.6*  MCV 95.1 97.4  --   --  97.0  MCH 32.2 32.6  --   --  33.0  MCHC 33.9 33.5  --   --  34.1  RDW 17.4* 17.8*  --   --  18.0*  PLT 124* 134*  --   --  124*    BNPNo results for input(s): BNP, PROBNP in the last 168 hours.   DDimer No results for input(s): DDIMER in the last 168 hours.   Radiology    US Venous Img Upper Uni Right(DVT)  Result Date:  07/02/2020 CLINICAL DATA:  75 year old male with right arm swelling. EXAM: RIGHT UPPER EXTREMITY VENOUS DOPPLER ULTRASOUND TECHNIQUE: Gray-scale sonography with graded compression, as well as color Doppler and duplex ultrasound were performed to evaluate the upper extremity deep venous system from the level of the subclavian vein and including the jugular, axillary, basilic, radial, ulnar and upper cephalic vein. Spectral Doppler was utilized to evaluate flow at rest and with distal augmentation maneuvers. COMPARISON:  None. FINDINGS: Contralateral Subclavian Vein: Not well visualized due to bandages from dialysis access. Internal Jugular Vein: Limited visualization. No evidence of thrombus. Normal compressibility, respiratory phasicity and response to augmentation. Subclavian Vein: No evidence of thrombus. Normal compressibility, respiratory phasicity and response to augmentation. Axillary Vein: Limited visualization.  No evidence of thrombus. Cephalic Vein: No evidence of thrombus. Normal compressibility, respiratory phasicity and response to augmentation. Basilic Vein: No evidence of thrombus. Normal compressibility, respiratory phasicity and response to augmentation. Brachial Veins: No evidence of thrombus. Normal compressibility, respiratory phasicity and response to augmentation. Radial Veins: No evidence of thrombus. Normal compressibility, respiratory phasicity and response to augmentation. Ulnar Veins: No evidence of thrombus. Normal compressibility, respiratory phasicity and response to augmentation. Other Findings:  None visualized. IMPRESSION: No evidence of right upper extremity deep vein thrombosis. Limited visualization of the right internal jugular and right axillary veins. Ruthann Cancer, MD [facility] Radiology Electronically Signed   By: Ruthann Cancer MD   On: 07/02/2020 11:51   ECHOCARDIOGRAM COMPLETE  Result Date: 07/02/2020    ECHOCARDIOGRAM  REPORT   Patient Name:   Scott Oconnor Date of Exam: 07/02/2020 Medical Rec #:  [id number]         Height:       70.0 in Accession #:    [id number]        Weight:       122.1 lb Date of Birth:  [birth year]         BSA:          1.693 m Patient Age:    75 years          BP:  183/113 mmHg Patient Gender: M                 HR:           99 bpm. Exam Location:  [facility] Procedure: 2D Echo, Color Doppler and Cardiac Doppler Indications:     I21.4 NSTEMI  History:         Patient has prior history of Echocardiogram examinations, most                  recent 01/29/2020. PVD, CKD; Risk Factors:Dyslipidemia and                  Hypertension. Pt tested positive for COVID-19 on 06/18/20.  Sonographer:     Charmayne Sheer RDCS (AE) Referring Phys:  [id number] Kate Sable Diagnosing Phys: Ida Rogue MD  Sonographer Comments: Suboptimal subcostal window. IMPRESSIONS  1. Left ventricular ejection fraction, by estimation, is 20 to 25%. The left ventricle has severely decreased function. The left ventricle demonstrates global hypokinesis, basal regions best preserved. Unable to exclude component of stress cardiomyopathy. The left ventricular internal cavity size was mildly dilated. Left ventricular diastolic parameters are consistent with Grade II diastolic dysfunction (pseudonormalization).  2. Right ventricular systolic function is normal. The right ventricular size is normal.  3. Mild mitral valve regurgitation.  4. Aortic valve regurgitation is mild to moderate. FINDINGS  Left Ventricle: Left ventricular ejection fraction, by estimation, is 20 to 25%. The left ventricle has severely decreased function. The left ventricle demonstrates global hypokinesis. The left ventricular internal cavity size was mildly dilated. There is no left ventricular hypertrophy. Left ventricular diastolic parameters are consistent with Grade II diastolic dysfunction (pseudonormalization). Right Ventricle: The right ventricular size is normal. No  increase in right ventricular wall thickness. Right ventricular systolic function is normal. Left Atrium: Left atrial size was normal in size. Right Atrium: Right atrial size was normal in size. Pericardium: There is no evidence of pericardial effusion. Mitral Valve: The mitral valve is normal in structure. Mild mitral annular calcification. Mild mitral valve regurgitation. No evidence of mitral valve stenosis. MV peak gradient, 4.8 mmHg. The mean mitral valve gradient is 2.0 mmHg. Tricuspid Valve: The tricuspid valve is normal in structure. Tricuspid valve regurgitation is mild . No evidence of tricuspid stenosis. Aortic Valve: The aortic valve is normal in structure. Aortic valve regurgitation is mild to moderate. No aortic stenosis is present. Aortic valve mean gradient measures 1.0 mmHg. Aortic valve peak gradient measures 2.8 mmHg. Aortic valve area, by VTI measures 2.54 cm. Pulmonic Valve: The pulmonic valve was normal in structure. Pulmonic valve regurgitation is mild. No evidence of pulmonic stenosis. Aorta: The aortic root is normal in size and structure. Venous: The inferior vena cava is normal in size with greater than 50% respiratory variability, suggesting right atrial pressure of 3 mmHg. IAS/Shunts: No atrial level shunt detected by color flow Doppler.  LEFT VENTRICLE PLAX 2D LVIDd:         4.90 cm      Diastology LVIDs:         4.00 cm      LV e' medial:    2.83 cm/s LV PW:         1.00 cm      LV E/e' medial:  37.8 LV IVS:        0.90 cm      LV e' lateral:   4.35 cm/s LVOT diam:     2.30 cm  LV E/e' lateral: 24.6 LV SV:         33 LV SV Index:   20 LVOT Area:     4.15 cm  LV Volumes (MOD) LV vol d, MOD A2C: 114.0 ml LV vol d, MOD A4C: 113.0 ml LV vol s, MOD A2C: 70.4 ml LV vol s, MOD A4C: 72.6 ml LV SV MOD A2C:     43.6 ml LV SV MOD A4C:     113.0 ml LV SV MOD BP:      37.4 ml RIGHT VENTRICLE RV Basal diam:  2.90 cm LEFT ATRIUM             Index       RIGHT ATRIUM           Index LA diam:         3.80 cm 2.25 cm/m  RA Area:     11.60 cm LA Vol (A2C):   43.7 ml 25.82 ml/m RA Volume:   28.90 ml  17.07 ml/m LA Vol (A4C):   47.5 ml 28.06 ml/m LA Biplane Vol: 48.7 ml 28.77 ml/m  AORTIC VALVE                   PULMONIC VALVE AV Area (Vmax):    2.72 cm    PV Vmax:       0.93 m/s AV Area (Vmean):   2.64 cm    PV Vmean:      52.300 cm/s AV Area (VTI):     2.54 cm    PV VTI:        0.117 m AV Vmax:           83.80 cm/s  PV Peak grad:  3.5 mmHg AV Vmean:          53.800 cm/s PV Mean grad:  1.0 mmHg AV VTI:            0.131 m AV Peak Grad:      2.8 mmHg AV Mean Grad:      1.0 mmHg LVOT Vmax:         54.80 cm/s LVOT Vmean:        34.200 cm/s LVOT VTI:          0.080 m LVOT/AV VTI ratio: 0.61  AORTA Ao Root diam: 3.10 cm MITRAL VALVE                TRICUSPID VALVE MV Area (PHT): 5.61 cm     TR Peak grad:   41.0 mmHg MV Area VTI:   1.80 cm     TR Vmax:        320.00 cm/s MV Peak grad:  4.8 mmHg MV Mean grad:  2.0 mmHg     SHUNTS MV Vmax:       1.09 m/s     Systemic VTI:  0.08 m MV Vmean:      64.8 cm/s    Systemic Diam: 2.30 cm MV Decel Time: 135 msec MV E velocity: 107.00 cm/s Ida Rogue MD Electronically signed by Ida Rogue MD Signature Date/Time: 07/02/2020/2:55:42 PM    Final     Cardiac Studies   TTE (07/02/2020): 1. Left ventricular ejection fraction, by estimation, is 20 to 25%. The  left ventricle has severely decreased function. The left ventricle  demonstrates global hypokinesis, basal regions best preserved. Unable to  exclude component of stress  cardiomyopathy. The left ventricular internal cavity size was mildly  dilated. Left ventricular diastolic parameters are consistent  with Grade  II diastolic dysfunction (pseudonormalization).  2. Right ventricular systolic function is normal. The right ventricular  size is normal.  3. Mild mitral valve regurgitation.  4. Aortic valve regurgitation is mild to moderate.   Patient Profile     75 y.o. male chronic HFrEF and  Brandii Lakey-stage renal disease, whom we are seeing due to elevated troponin.  Assessment & Plan    Elevated troponin: Difficult to assess if the patient has had any symptoms.  Troponin downtrending from initial check on 07/01/2020.  LVEF decreased from 30-35% in 10/2019 to 20-25% during this admission.  He was treated with IV heparin, though there has been concern about his worsening anemia.  I suspect he likely has demand ischemia in the setting of his cardiomyopathy and Burak Zerbe-stage renal disease with COVID-19 infection.  Subacute MI cannot be excluded, though downtrending troponin on admission suggest that he did not present with acute NSTEMI.  Given his multiple comorbidities and poor prognosis, I do not recommend any further intervention at this time.  Discontinue IV heparin.  Continue aspirin 81 mg daily as long as overt bleeding does not occur.  Defer addition of P2 Y 12 inhibitor given worsening anemia.  Consider PRBC transfusion if hemoglobin drops further or obvious symptoms develop.  Chronic HFrEF: Volume exam is somewhat challenging though patient does not appear massively volume overloaded.  Continue carvedilol, hydralazine, and Isordil.  Defer addition of ACE inhibitor/ARB in the setting of Kadarious Dikes-stage renal disease and altered mental status.  ESRD:  Per nephrology.  COVID-19 infection:  Per IM and PCCM.  For questions or updates, please contact [place] Please consult [email or web address] for contact info under [facility] Cardiology.    Signed, Nelva Bush, MD  07/03/2020, 4:42 PM

## 2020-07-03 NOTE — Consult Note (Signed)
NAME:  Scott Oconnor, MRN:  [id number], DOB:  24-Jun-1945, LOS: 5 ADMISSION DATE:  07/19/2020, CONSULTATION DATE:  07/02/2020 REFERRING MD:  Dr. Holley Raring, CHIEF COMPLAINT:  Acute Hypoxic Respiratory Failure   Brief History:  75 y.o. Male with a PMH significant for ESRD on HD and HFrEF admitted with Sepsis and Acute Hypoxic Respiratory Failure in the setting of COVID-19 Pneumonia and superimposed bacterial pneumonia, along with NSTEMI and failure to thrive.  History of Present Illness:  75 year old male with medical history of ESRD on hemodialysis, who was started on peritoneal dialysis at home recently after being switched from hemodialysis, anemia of chronic kidney disease, hypertension, CHF, peripheral vascular disease, hyperlipidemia, Raynaud's syndrome who was recently admitted on 06/18/2020 for acute GI bleed and Covid pneumonia.  Hospital course was complicated by bacterial pneumonia, sepsis, uremia and metabolic encephalopathy.  He was treated with Solu-Medrol with plans for continued taper at home for COVID-19 pneumonia.  He was not given remdesivir due to family preference.  He was treated with Zosyn with plan to complete course with Augmentin at home for recent diagnosis of lumbar pneumonia.  GI has seen the patient and recommended conservative management.  He was given 3 units PRBC during his hospitalization. Patient was discharged home on 06/27/2020.  His family reported was extremely week with poor PO intake since time of discharge, along with fever.  He returned to ED the following day on 07/20/2020 and was admitted by the Hospitalist for further workup and treatment of Sepsis and Acute Hypoxic Respiratory Failure in the setting of COVID-19 Pneumonia and superimposed bacterial pneumonia.   On 07/01/20 he developed chest pain, and ruled in for NSTEMI of which he was placed on Heparin drip and was seen by Cardiology.  On 07/02/20 he is exhibiting increased work of breathing.  He is at high risk for  decompensation and cardiac arrest.  PCCM was consulted.  After discussion with family, family elected to make him a DNR/DNI.  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 No acute finding by CT. Chronic small-vessel ischemic changes of the white matter. 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 2/7: CXR>>Partially visualized tube curved in the lower neck above the thoracic inlet. Recommend retraction and repositioning. Dialysis catheter in similar position. Small left pleural effusion and left lung base atelectasis or infiltrate. No pneumothorax. Stable cardiac silhouette. Atherosclerotic calcification of the aorta. No acute osseous pathology. Partially visualized endovascular stent graft of the distal abdominal aorta. 2/10: Venous US RUE>> 2/10: Echocardiogram>>  Micro Data:  1/27: SARS-CoV-2 Ag>>Positive 2/2: Blood culture x2>> no growth 2/3: MRSA PCR>>negative 2/5: Hepatitis B Surface Ag>>nonreactive 2/6: Blood culture x2>> 2/8: C-difficile>>negative  Antimicrobials:  Zosyn 2/6>>  CC Follow up resp failure Follow up COVID  HPI Signs of aspiration +resp distress +9 BM's in last 24 hrs Patient not responding    CBC    Component Value Date/Time   WBC 9.3 07/03/2020 0141   RBC 2.33 (L) 07/03/2020 0141   HGB 7.7 (L) 07/03/2020 0141   HCT 22.6 (L) 07/03/2020 0141   PLT 124 (L) 07/03/2020 0141   MCV 97.0 07/03/2020 0141  MCH 33.0 07/03/2020 0141   MCHC 34.1 07/03/2020 0141   RDW 18.0 (H) 07/03/2020 0141   LYMPHSABS 0.6  (L) 06/29/2020 0213   MONOABS 0.9 06/29/2020 0213   EOSABS 0.0 06/29/2020 0213   BASOSABS 0.0 06/29/2020 0213   BMP Latest Ref Rng & Units 07/03/2020 07/02/2020 07/01/2020  Glucose 70 - 99 mg/dL 415(H) 228(H) 197(H)  BUN 8 - 23 mg/dL 121(H) 105(H) 62(H)  Creatinine 0.61 - 1.24 mg/dL 8.06(H) 8.66(H) 6.03(H)  BUN/Creat Ratio 6 - 22 (calc) - - -  Sodium 135 - 145 mmol/L 137 140 137  Potassium 3.5 - 5.1 mmol/L 3.2(L) 4.0 4.0  Chloride 98 - 111 mmol/L 98 98 96(L)  CO2 22 - 32 mmol/L 16(L) 20(L) 21(L)  Calcium 8.9 - 10.3 mg/dL 6.7(L) 7.1(L) 7.8(L)     Objective   Blood pressure (!) 141/81, pulse 85, temperature 98.6 F (37 C), temperature source Oral, resp. rate 16, height '5\' 10"'$  (1.778 m), weight 55.4 kg, SpO2 95 %.        Intake/Output Summary (Last 24 hours) at 07/03/2020 1537 Last data filed at 07/03/2020 1251 Gross per 24 hour  Intake 3085.83 ml  Output --  Net 3085.83 ml   Filed Weights   07/02/2020 1636 06/29/20 0500 06/30/20 0500  Weight: 55 kg 55 kg 55.4 kg  PHYSICAL EXAMINATION:  GENERAL:critically ill appearing, +resp distress HEAD: Normocephalic, atraumatic.  EYES: Pupils equal, round, reactive to light.  No scleral icterus.  MOUTH: Moist mucosal membrane. NECK: Supple. No thyromegaly. No nodules. No JVD.  PULMONARY: +rhonchi, +wheezing CARDIOVASCULAR: S1 and S2. Regular rate and rhythm. No murmurs, rubs, or gallops.  GASTROINTESTINAL: Soft, nontender, -distended. Positive bowel sounds.  MUSCULOSKELETAL: No swelling, clubbing, or edema.  NEUROLOGIC: obtunded SKIN:intact,warm,dry      Resolved Hospital Problem list   N/A  Assessment & Plan:   Acute Hypoxic Respiratory Failure secondary due to COVID-19 Pneumonia and Superimposed Bacterial Pneumonia +SIGNS OF ASPIRATION -Supplemental O2 as needed to maintain O2 sats >88% -Pt made DNR/DNI -Family deferred pt receiving Remdesivir -IV Steroids -Maintain airborne & contact isolation   Sepsis in the setting of  COVID-19 Pneumonia and Superimposed Bacterial Pneumonia -Monitor fever curve -Trend WBC's and Procalcitonin -Follow cultures as above -Continue Zosyn for now pending culture and sensitivities   NSTEMI severe CHF EF [id number] systolic dysfunction Hypertension -Continuous cardiac monitoring -Maintain MAP >65 -Heparin gtt stopped -Cardiology following -Coreg and Hydralazine as per Cardiology -Repeat Echo on 2/10 pending  STOP TF's for now 10 BM's    Anemia of Chronic Disease superimposed on Recent GI Bleed -Monitor for S/Sx of bleeding ~ no signs of GI bleeding to date, GI did not recommend any intervention on last admission -Trend CBC -Heparin gtt for Anticoagulation/VTE Prophylaxis  -Transfuse for Hgb <7   Acute Metabolic Encephalopathy in the setting of Sepsis, COVID-19 Pneumonia and Uremia -Provide supportive care -Promote normal sleep/wake cycle -Avoid sedating meds as able -CT Head this admission negative for acute intracranial findings -Treat underlying processes Will; need to consider HD for uremia   Failure to Thrive (daughter reports decline since January 20th, 2022) -Tube feeds via NG tube -Palliative Care consulted    Overall, patient is critically ill, prognosis is guarded.  Patient with Multiorgan failure and at high risk for cardiac arrest and death.   Patient was suffering at this time.  Corrin Parker, M.D.  [facility]  Medical Director [place] Director [facility] Cardio-Pulmonary Department

## 2020-07-03 NOTE — Progress Notes (Signed)
[place], [place] 06/27/20  Subjective:   [name] was admitted to [facility] on 07/09/2020 for weakness and poor appetite. Per daughter, the he also complains of body aches and nausea.  Covid -19 +  Patient resting quietly in bed 2 daughters and son at bedside Patient opens eyes to discomfort Family feels he is improving. Currently receiving tube feeds at '45mg'$ /hr through NGT continues heparin drip   Objective:  Vital signs in last 24 hours:  Temp:  [97.6 F (36.4 C)-99.1 F (37.3 C)] 97.8 F (36.6 C) (02/05 1245) Pulse Rate:  [54-79] 79 (02/05 1245) Resp:  [12-26] 22 (02/05 1245) BP: (101-168)/(66-87) 157/76 (02/05 1245) SpO2:  [91 %-100 %] 91 % (02/05 1245)  Weight change:  Filed Weights   06/20/20 0519 06/22/20 0458 06/26/20 0500  Weight: 55.1 kg 55.2 kg 54.9 kg    Intake/Output:    Intake/Output Summary (Last 24 hours) at 06/27/2020 1356 Last data filed at 06/27/2020 1300 Gross per 24 hour  Intake 348 ml  Output 0 ml  Net 348 ml     Physical Exam: General:  Resting in bed, chronically ill-appearing  HEENT normocephalic, dry oral mucosa  Pulm/lungs Clear bilaterally, accessory muscle involvement  CVS/Heart  S1-S2 present, no chest pain  Abdomen:   soft, nontender  Extremities: no peripheral edema  Neurologic:  Arousable, responds to few yes/no questions  Skin:  No lesions or rashes  Access:  PD cath ,Left IJ Permcath       Basic Metabolic Panel:  Recent Labs  Lab 06/21/20 0416 06/22/20 0510 06/23/20 0559 06/24/20 [id number] 06/24/20 2159 06/25/20 0759 06/26/20 0536 06/27/20 1051  NA 139   < > 143 140 136 137 134*  --   K 4.8   < > 4.5 4.3 3.8 4.3 4.9  --   CL 98   < > 97* 98 95* 95* 93*  --   CO2 19*   < > 22 20* 21* 22 23  --   GLUCOSE 116*   < > 132* 118* 87 83 168*  --   BUN 128*   < > 116* 109* 51* 60* 48*  --   CREATININE 15.49*   < > 15.88* 16.30* 8.46* 9.52* 6.39*  --   CALCIUM 7.6*   < > 7.7* 7.1* 7.6*  7.6* 8.1*  --   PHOS 9.5*  --  9.1* 10.0*  --   --   --  6.4*   < > = values in this interval not displayed.     CBC: Recent Labs  Lab 06/24/20 0619 06/24/20 2159 06/25/20 0759 06/26/20 0536 06/27/20 0618  WBC 9.2 9.9 9.8 5.5 7.8  NEUTROABS  --  8.7*  --   --   --   HGB 6.9* 8.0* 8.1* 7.7* 7.2*  HCT 20.6* 23.8* 24.0* 23.1* 21.6*  MCV 97.6 99.2 98.0 97.9 97.3  PLT 98* 100* 112* 125* 133*      Lab Results  Component Value Date   HEPBSAG NON REACTIVE 05/06/2020   HEPBSAB NON REACTIVE 09/20/2019   HEPBIGM NON REACTIVE 09/20/2019      Microbiology:  Recent Results (from the past 240 hour(s))  Culture, blood (x 2)     Status: None (Preliminary result)   Collection Time: 06/24/20 10:00 PM   Specimen: BLOOD  Result Value Ref Range Status   Specimen Description BLOOD BLOOD LEFT HAND  Final   Special Requests   Final    BOTTLES DRAWN AEROBIC ONLY  Blood Culture results may not be optimal due to an inadequate volume of blood received in culture bottles   Culture   Final    NO GROWTH 3 DAYS Performed at [facility], [address] [place], [place] [address]    Report Status PENDING  Incomplete  Culture, blood (x 2)     Status: None (Preliminary result)   Collection Time: 06/24/20 10:01 PM   Specimen: BLOOD  Result Value Ref Range Status   Specimen Description BLOOD BLOOD RIGHT HAND  Final   Special Requests   Final    BOTTLES DRAWN AEROBIC ONLY Blood Culture results may not be optimal due to an inadequate volume of blood received in culture bottles   Culture   Final    NO GROWTH 3 DAYS Performed at [facility], [address] [place], [place] [address]    Report Status PENDING  Incomplete  MRSA PCR Screening     Status: None   Collection Time: 06/25/20  6:27 PM   Specimen: Nasopharyngeal  Result Value Ref Range Status   MRSA by PCR NEGATIVE NEGATIVE Final    Comment:        The GeneXpert MRSA Assay (FDA approved for NASAL specimens only),  is one component of a comprehensive MRSA colonization surveillance program. It is not intended to diagnose MRSA infection nor to guide or monitor treatment for MRSA infections. Performed at [facility], [address] [place], [place] [address]     Coagulation Studies: Recent Labs    06/24/20 2157/08/27  LABPROT 14.2  INR 1.1    Urinalysis: No results for input(s): COLORURINE, LABSPEC, PHURINE, GLUCOSEU, HGBUR, BILIRUBINUR, KETONESUR, PROTEINUR, UROBILINOGEN, NITRITE, LEUKOCYTESUR in the last 72 hours.  Invalid input(s): APPERANCEUR    Imaging: No results found.   Medications:   . sodium chloride 50 mL/hr at 06/26/20 0030  . acetaminophen 1,000 mg (06/27/20 0600)  . dialysis solution 1.5% low-MG/low-CA    . piperacillin-tazobactam (ZOSYN)  IV 2.25 g (06/27/20 0858)   . sodium chloride   Intravenous Once  . sodium chloride   Intravenous Once  . atorvastatin  40 mg Oral Daily  . benzonatate  100 mg Oral Q8H  . carvedilol  3.125 mg Oral BID WC  . Chlorhexidine Gluconate Cloth  6 each Topical Daily  . epoetin (EPOGEN/PROCRIT) injection  20,000 Units Subcutaneous Weekly  . gentamicin cream  1 application Topical Daily  . guaiFENesin  600 mg Oral BID  . methylPREDNISolone (SOLU-MEDROL) injection  40 mg Intravenous Daily  . multivitamin  1 tablet Oral QHS  . pantoprazole  40 mg Intravenous Q12H  . polyethylene glycol  17 g Oral Daily   acetaminophen **OR** acetaminophen **OR** acetaminophen, calcium carbonate, guaiFENesin-dextromethorphan, lidocaine, loperamide, ondansetron (ZOFRAN) IV, ondansetron (ZOFRAN) IV  Assessment/ Plan:  75 y.o. male with  end stage renal disease on peritoneal dialysis, hypertension, AAA repair, congestive heart failure, peripheral vascular disease, raynaud's syndrome, hyperlipidemia  was admitted on 06/18/2020 for  Active Problems:   Essential hypertension   HFrEF (heart failure with reduced ejection fraction) (HCC)   End stage renal  disease on dialysis (HCC)   Acute blood loss anemia   [id number]   Toxic metabolic encephalopathy   Lobar pneumonia (HCC)   Sepsis with encephalopathy without septic shock (HCC)  Acute GI bleeding [K92.2] Generalized weakness [R53.1] Gastrointestinal hemorrhage, unspecified gastrointestinal hemorrhage type [K92.2] COVID-19 [U07.1]   [facility] 52.5kg CCPD 9 hours 5 exchanges 2042m fills  #. ESRD Unable  to tolerate hemodialyss during treatment yesterday Given PD last night. Tolerated treatment Will plan to continue this treatment through the weekend Patient made DNR status yesterday  #. Failure to thrive Current tube feeds increased to 86m/hr Diarrhea continued overnight Plan to continue tube feeds for now  #. Anemia of CKD Current Hgb 7.7 EPO ordered with PD treatments  # Secondary hyperparathyroidism   No results found for: PTH Will monitor these labs during PD treatments  #Covid -19 positive pneumonia Isolation remains in place Completed treatments yesterday Primary team managing   SColon Flattery NP [facility]

## 2020-07-03 NOTE — Progress Notes (Signed)
Nutrition Follow Up Note   DOCUMENTATION CODES:   Severe malnutrition in context of chronic illness  INTERVENTION:   Once tube feeds restarted, recommend:  Increase Nepro to 18m/hr + Prosource TF 458mBID via tube  Free water flushes 3037m4 hours to maintain tube patency   Regimen provides 2240kcal/day, 120g/day protein and 1052m73my free water   Rena-vit daily via tube   NutriSource fiber 3 packets daily via tube   NUTRITION DIAGNOSIS:   Severe Malnutrition related to chronic illness (ESRD on dialysis) as evidenced by severe muscle depletion,severe fat depletion.  GOAL:   Patient will meet greater than or equal to 90% of their needs  -met with tube feeds   MONITOR:   PO intake,Supplement acceptance,Labs,Weight trends,Skin,I & O's,TF tolerance, tube feeds   ASSESSMENT:   74 y23. male with  end stage renal disease on peritoneal dialysis, hypertension, AAA repair, congestive heart failure, peripheral vascular disease, raynaud's syndrome and hyperlipidemia who was admitted on 06/18/2020 for COVID 19 PNA, sepsis and AMS  Pt is well known to this RD from multiple previous admits from 2021. Pt with poor appetite and oral intake at baseline and follows a highly restricted diet at home where he tries to avoid sugar and other foods that he feels are unhealthy. Pt has been diagnosed with malnutrition during every admission and this is a chronic issue that has never resolved. Pt does drink Premier Protein at home but does not tolerate Ensure or Boost as they have too much sugar.   Spoke with pt's son at length via phone. Family is concerned about about pt's nutritional status and his diarrhea. Pt has been receiving tube feeds via nasogastric tube since 2/7. Pt is tolerating tube feeds well but has continued to have diarrhea. Pt initiated on NutriSource fiber to try and help with the diarrhea. Son has numerous questions today regarding patient's increased BUN/creatinine labs; referred  son to speak with Nephrologist or attending MD with these questions. Explained thoroughly to son regarding what tube feed formula patient is on, how his needs are calculated and how we determine how much free water to give. Son is concerned about 1:) pt's hydration status; son feels patient is dehydrated and not getting enough fluids 2:) he feels patient is getting too much protein which is increasing his BUN/creatinine 3:) he feels patient is not getting enough calories as he is only getting 2064kcal/day and his needs are estimated between 2000-2200kcal/day 4:) he feels tube feeds are causing pt's diarrhea. Explained to pt's son that patient is very sick and diarrhea can be coming from numerous sources including antibiotics, stress, steroids, COVID 19 or the tube feeds. RD explained that we do not expect to see nice formed stools from patients who are receiving tube feeds as formula is partially hydrolyzed and is in liquid form going in. RD explained the use of the fiber and why this was added. RD also explained the importance to meet's patients needs and that over feeding him can be just as detrimental as underfeeding him. Referred son to speak with MD about patient's fluid status. Pt is currently + 4.7L on his I & Os. RD can accomodate son's request to increase Nepro to provide maximum calories; explained that patient may not tolerate increased volume. Per chart, pt has remained weight stable since admit.    Medications reviewed and include: heparin, insulin, protonix, Na bicarbonate, zosyn   Labs reviewed: K 3.2(L), BUN 121(H), creat 8.06(H), P 10.8(H) Hgb 7.7(L), Hct 22.6(L) cbgs- 358,  380, 376, 205, 147 x 24 hrs  Diet Order:   Diet Order            Diet renal with fluid restriction Fluid restriction: 1200 mL Fluid; Room service appropriate? Yes; Fluid consistency: Thin  Diet effective now                EDUCATION NEEDS:   Education needs have been addressed (with pt's family)  Skin:  Skin  Assessment: Reviewed RN Assessment  Last BM:  2/11- type 7  Height:   Ht Readings from Last 1 Encounters:  07/13/2020 '5\' 10"'  (1.778 m)    Weight:   Wt Readings from Last 1 Encounters:  06/30/20 55.4 kg    Ideal Body Weight:  75.5 kg  BMI:  Body mass index is 17.52 kg/m.  Estimated Nutritional Needs:   Kcal:  2000-2200  Protein:  120-135 grams  Fluid:  1000 +_ UOP  Koleen Distance MS, RD, LDN Please refer to [facility] for RD and/or RD on-call/weekend/after hours pager

## 2020-07-03 NOTE — Consult Note (Addendum)
Consultation Note Date: 07/03/2020   Patient Name: Scott Oconnor  DOB: June 13, 1945  MRN: [id number]  Age / Sex: 75 y.o., male  PCP: [name], MD Referring Physician: Wyvonnia Dusky, MD  Reason for Consultation: Establishing goals of care  HPI/Patient Profile: 75 y.o. male  with past medical history of ESRD on peritoneal dialysis, anemia of chronic disease, hypertension, CHF, PVD, HLD, Raynaud's syndrome recently admitted on 06/18/20 for acute GI bleeding and COVID pneumonia. Hospitalization complicated by bacterial pneumonia, sepsis, uremia, metabolic encephalopathy. Did not receive remdesivir due to family preference. Treated with steroids and Zosyn with plans to complete Augmentin at home. GI recommended conservative management. Discharged 06/27/20. Hospital readmission on 07/06/2020 with weakness and poor oral intake. Current hospitalization secondary to severe sepsis due to COVID 19 pneumonia, NSTEMI with reduced EF on ECHO of 49-44% grade II diastolic dysfunction and global hypokinesis, metabolic encephalopathy, failure to thrive, anemia of chronic disease, ESRD receiving PD. On 2/9 developed chest pain ruled in for NSTEMI. Cardiology following. On 2/10 increased work of breathing with risk for acute decompensation. PCCM consulted and following family discussions, decision for DNR/DNI code status. Palliative medicine consultation for goals of care and support to family.   Clinical Assessment and Goals of Care: Reviewed medical records, discussed with care team, and met with family at bedside to provide support. Introduced role of palliative medicine. Scott Oconnor was seen by my colleague Mariana Kaufman NP on previous admission.   Scott Oconnor will open eyes to voice. He is resting comfortably during visit.  Daughters at bedside including primary caregiver, [name]. Also patient's daughter-in-law.    [name] is a strong  advocate for her father. She lives with him and is actively involved in his care on a day to day basis. [name] has an excellent understanding of her father's condition. The patient has a niece in [place] who is a pulmonologist and great support to the family as well. Reviewed course of hospitalization in detail including diagnoses, interventions, plan of care.   [name] has great trust in the doctors that know her father well including Dr. [name] and Dr. [name].   [name] shares that the family is realistic but remain hopeful that he will continue to take strides in the right direction. She has seen her father overcome many things and is confident he will get better. She speaks of taking this one day at a time.  Answered questions. Support provided to family and reassured of ongoing, intermittent palliative support this admission. PMT contact information given. I did explain to family that PMT is not on service at [facility] through the weekend but will check in next week.    SUMMARY OF RECOMMENDATIONS    DNR/DNI in the event of decline.   Otherwise, FULL scope treatment. Continue current plan of care and medical management. Watchful waiting. Time for outcomes.   Family is very supportive and involved in his care. Daughter, [name] is primary contact and caregiver.   PMT will continue to intermittently follow and provide support to family.   Code  Status/Advance Care Planning:  DNR/DNI per previous conversation with PCCM and family.  Symptom Management:   Per attending  Palliative Prophylaxis:   Aspiration, Bowel Regimen, Delirium Protocol, Frequent Pain Assessment, Oral Care and Turn Reposition  Psycho-social/Spiritual:   Desire for further Chaplaincy support: yes  Additional Recommendations: Caregiving  Support/Resources and Compassionate Wean Education  Prognosis:   Unable to determine  Discharge Planning: To Be Determined      Primary Diagnoses: Present on Admission: . Failure  to thrive in adult   I have reviewed the medical record, interviewed the patient and family, and examined the patient. The following aspects are pertinent.  Past Medical History:  Diagnosis Date  . Hyperlipidemia 07/2019  . Hypertension 07/2019  . PVD (peripheral vascular disease) ([place]) 07/2019  . Renal disorder 07/2019   ESRD  . 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 Marital status: Married    Spouse name: Not on file  . Number of children: Not on file  . Years of education: Not on file  . Highest education level: Not on file  Occupational History  . Not on file  Tobacco Use  . Smoking status: Former Smoker    Packs/day: 0.33    Types: Cigarettes  . Smokeless tobacco: Never Used  . Tobacco comment: 6 cigarettes daily   Vaping Use  . Vaping Use: Never used  Substance and Sexual Activity  . Alcohol use: No  . Drug use: No  . Sexual activity: Yes  Other Topics Concern  . Not on file  Social History Narrative  . Not on file   Social Determinants of Health   Financial Resource Strain: Not on file  Food Insecurity: Not on file  Transportation Needs: Not on file  Physical Activity: Not on file  Stress: Not on file  Social Connections: Not on file   Family History  Problem Relation Age of Onset  . Heart disease Father   . Sudden Cardiac Death Father   . Heart disease Brother   . Hyperlipidemia Brother   . Heart disease Sister   . Heart disease Brother   . Hyperlipidemia Brother   . Hyperlipidemia Brother   . Hyperlipidemia Brother   . Hyperlipidemia Brother   . Hyperlipidemia Brother   . Heart disease Sister    Scheduled Meds: . benzonatate  100 mg Oral Q8H  . carvedilol  6.25 mg Per Tube BID WC  . Chlorhexidine Gluconate Cloth  6 each Topical Daily  . feeding supplement (PROSource TF)  45 mL Per Tube TID  . fiber  1 packet Per Tube TID  . free water  30 mL Per Tube Q4H  . gentamicin cream  1 application Topical  Daily  . Gerhardt's butt cream   Topical TID  . heparin  5,000 Units Subcutaneous Q8H  . hydrALAZINE  50 mg Oral Q8H  . insulin aspart  0-9 Units Subcutaneous Q4H  . isosorbide dinitrate  20 mg Oral TID  . LORazepam  1 mg Intravenous Once  . multivitamin  1 tablet Per Tube QHS  . pantoprazole (PROTONIX) IV  40 mg Intravenous Q12H  . sodium bicarbonate  1,300 mg Per Tube BID  . sodium chloride flush  3 mL Intravenous Q12H   Continuous Infusions: . dialysis solution 1.5% low-MG/low-CA    . feeding supplement (NEPRO CARB STEADY) 1,000 mL (07/02/20 1727)   PRN Meds:.acetaminophen (TYLENOL) oral liquid 160 mg/5 mL, albuterol, guaiFENesin-dextromethorphan, hydrALAZINE, loperamide HCl, morphine injection,  nitroGLYCERIN, ondansetron **OR** ondansetron (ZOFRAN) IV, phenol Medications Prior to Admission:  Prior to Admission medications   Medication Sig Start Date End Date Taking? Authorizing Provider  acetaminophen (TYLENOL) 500 MG tablet Take 500-1,000 mg by mouth every 8 (eight) hours as needed for mild pain or moderate pain.    Yes [provider]  calcium carbonate (TUMS - DOSED IN MG ELEMENTAL CALCIUM) 500 MG chewable tablet Chew 1 tablet by mouth 3 (three) times daily as needed for heartburn.   Yes [provider]  multivitamin (RENA-VIT) TABS tablet Take 1 tablet by mouth at bedtime. 09/30/19  Yes Fritzi Mandes, MD  albuterol (VENTOLIN HFA) 108 (90 Base) MCG/ACT inhaler SMARTSIG:2 Inhalation Via Inhaler Every 6 Hours PRN Patient not taking: No sig reported 06/12/20   [provider]  benzonatate (TESSALON) 100 MG capsule Take 1 capsule (100 mg total) by mouth every 8 (eight) hours. 06/27/20   Loletha Grayer, MD  carvedilol (COREG) 3.125 MG tablet Take 1 tablet (3.125 mg total) by mouth 2 (two) times daily with a meal. 06/27/20   Leslye Peer, Richard, MD  lidocaine (XYLOCAINE) 2 % solution Use as directed 15 mLs in the mouth or throat every 4 (four) hours as needed for mouth  pain. 06/27/20   Loletha Grayer, MD  ondansetron (ZOFRAN) 4 MG tablet Take 1 tablet (4 mg total) by mouth daily as needed for nausea or vomiting. 06/27/20 06/27/21  Loletha Grayer, MD  pantoprazole (PROTONIX) 40 MG tablet Take 1 tablet 2 times a day for 30 days, then change to 1 tablet a day 06/27/20   Loletha Grayer, MD  predniSONE (DELTASONE) 10 MG tablet Two tabs po daily for six days 06/27/20   Loletha Grayer, MD   Allergies  Allergen Reactions  . Ivp Dye [Iodinated Diagnostic Agents]     ESRD patient    Review of Systems  Unable to perform ROS: Acuity of condition   Physical Exam Vitals and nursing note reviewed.  Constitutional:      Appearance: He is cachectic. He is ill-appearing.  HENT:     Head: Normocephalic and atraumatic.  Pulmonary:     Effort: No tachypnea, accessory muscle usage or respiratory distress.  Abdominal:     Comments: NGT-tube feeds  Skin:    General: Skin is warm and dry.  Neurological:     Mental Status: He is easily aroused.     Comments: Sleeping, comfortable    Vital Signs: BP (!) 141/81 (BP Location: Left Arm)   Pulse 85   Temp 98.6 F (37 C) (Oral)   Resp 16   Ht '5\' 10"'  (1.778 m)   Wt 55.4 kg   SpO2 95%   BMI 17.52 kg/m  Pain Scale: 0-10 POSS *See Group Information*: S-Acceptable,Sleep, easy to arouse Pain Score: Asleep   SpO2: SpO2: 95 % O2 Device:SpO2: 95 % O2 Flow Rate: .O2 Flow Rate (L/min): 3 L/min  IO: Intake/output summary:   Intake/Output Summary (Last 24 hours) at 07/03/2020 1334 Last data filed at 07/03/2020 1251 Gross per 24 hour  Intake 3085.83 ml  Output -  Net 3085.83 ml    LBM: Last BM Date: 07/03/20 Baseline Weight: Weight: 55 kg Most recent weight: Weight: 55.4 kg     Palliative Assessment/Data: PPS 30%    Time Total: 40 Greater than 50%  of this time was spent counseling and coordinating care related to the above assessment and plan.  Signed by:  Ihor Dow, DNP, FNP-C Palliative Medicine Team   Phone:  [phone] Fax: [phone]   Please contact Palliative Medicine Team phone at [phone] for questions and concerns.  For individual provider: See Shea Evans

## 2020-07-03 NOTE — Consult Note (Signed)
Scott Oconnor , MD [address], [place], [place], [place], [address] [address], [place], [place], [place], [address] Phone: [phone]  Fax: [phone]  Consultation  Referring Provider:     Dr. Jimmye Norman  primary Care Physician:  [name], MD Primary Gastroenterologist: Dr. Haig Prophet Reason for Consultation:     Drop in hemoglobin  Date of Admission:  06/27/2020 Date of Consultation:  07/03/2020         HPI:   Scott Oconnor is a 75 y.o. male initially admitted.  With failure to thrive, Covid pneumonia with encephalopathy, CHF.  Also has hypertension and end-stage renal disease on peritoneal dialysis.  Diagnosed with an NSTEMI.  Ejection fraction 20 to 25%.  She was seen on 06/19/2020 by nurse practitioner Jacqulyn Liner with dark stool in the setting of COVID concerning for Covid gastroenteritis.  Plan was to monitor CBC and at some point consider endoscopic evaluation.  Patient is followed by Dr. Haig Prophet patient was provided supportive care and was discharged on 06/27/2020.  During the hospitalization Eliquis was stopped.  I was asked to see the patient for a drop in hemoglobin at the request of the family.  2 days back hemoglobin was 10.5 g and dropped to 8.4 g yesterday and 7.7 g today.Was recently admitted in January patient is on tube feeds.  Seen by cardiology.  Palliative care has been suggested due to multiorgan failure.  Overnight has worsened with metabolic acidosis.  When I went into the room to speak with the patient : he was unable to provide any history . Spoke with nurses changing him- no melena or hematemesis. Noted On Tube feed, loose stools.   Past Medical History:  Diagnosis Date  . Hyperlipidemia 07/2019  . Hypertension 07/2019  . PVD (peripheral vascular disease) ([facility]) 07/2019  . Renal disorder 07/2019   ESRD  . 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 DIALYSIS/PERMA CATHETER INSERTION N/A 09/26/2019    Procedure: DIALYSIS/PERMA CATHETER INSERTION;  Surgeon: Algernon Huxley, MD;  Location: [facility];  Service: Cardiovascular;  Laterality: N/A;  . DIALYSIS/PERMA CATHETER INSERTION N/A 06/23/2020   Procedure: DIALYSIS/PERMA CATHETER INSERTION;  Surgeon: Algernon Huxley, MD;  Location: [facility];  Service: Cardiovascular;  Laterality: N/A;  . DIALYSIS/PERMA CATHETER REMOVAL N/A 06/19/2020   Procedure: DIALYSIS/PERMA CATHETER REMOVAL;  Surgeon: Algernon Huxley, MD;  Location: [facility];  Service: Cardiovascular;  Laterality: N/A;  . ENDOVASCULAR REPAIR/STENT GRAFT N/A 08/14/2019   Procedure: ENDOVASCULAR REPAIR/STENT GRAFT;  Surgeon: Katha Cabal, MD;  Location: [facility];  Service: Cardiovascular;  Laterality: N/A;    Prior to Admission medications   Medication Sig Start Date End Date Taking? Authorizing Provider  acetaminophen (TYLENOL) 500 MG tablet Take 500-1,000 mg by mouth every 8 (eight) hours as needed for mild pain or moderate pain.    Yes [provider]  calcium carbonate (TUMS - DOSED IN MG ELEMENTAL CALCIUM) 500 MG chewable tablet Chew 1 tablet by mouth 3 (three) times daily as needed for heartburn.   Yes [provider]  multivitamin (RENA-VIT) TABS tablet Take 1 tablet by mouth at bedtime. 09/30/19  Yes Fritzi Mandes, MD  albuterol (VENTOLIN HFA) 108 (90 Base) MCG/ACT inhaler SMARTSIG:2 Inhalation Via Inhaler Every 6 Hours PRN Patient not taking: No sig reported 06/12/20   [provider]  benzonatate (TESSALON) 100 MG capsule Take 1 capsule (100 mg total) by mouth  every 8 (eight) hours. 06/27/20   Loletha Grayer, MD  carvedilol (COREG) 3.125 MG tablet Take 1 tablet (3.125 mg total) by mouth 2 (two) times daily with a meal. 06/27/20   Leslye Peer, Richard, MD  lidocaine (XYLOCAINE) 2 % solution Use as directed 15 mLs in the mouth or throat every 4 (four) hours as needed for mouth pain. 06/27/20   Loletha Grayer, MD  ondansetron  (ZOFRAN) 4 MG tablet Take 1 tablet (4 mg total) by mouth daily as needed for nausea or vomiting. 06/27/20 06/27/21  Loletha Grayer, MD  pantoprazole (PROTONIX) 40 MG tablet Take 1 tablet 2 times a day for 30 days, then change to 1 tablet a day 06/27/20   Loletha Grayer, MD  predniSONE (DELTASONE) 10 MG tablet Two tabs po daily for six days 06/27/20   Loletha Grayer, MD    Family History  Problem Relation Age of Onset  . Heart disease Father   . Sudden Cardiac Death Father   . Heart disease Brother   . Hyperlipidemia Brother   . Heart disease Sister   . Heart disease Brother   . Hyperlipidemia Brother   . Hyperlipidemia Brother   . Hyperlipidemia Brother   . Hyperlipidemia Brother   . Hyperlipidemia Brother   . Heart disease Sister      Social History   Tobacco Use  . Smoking status: Former Smoker    Packs/day: 0.33    Types: Cigarettes  . Smokeless tobacco: Never Used  . Tobacco comment: 6 cigarettes daily   Vaping Use  . Vaping Use: Never used  Substance Use Topics  . Alcohol use: No  . Drug use: No    Allergies as of 07/07/2020 - Review Complete 07/11/2020  Allergen Reaction Noted  . Ivp dye [iodinated diagnostic agents]  11/19/2019    Review of Systems:    Patient unable to provide ROS   Physical Exam:  Vital signs in last 24 hours: Temp:  [97.8 F (36.6 C)-98.6 F (37 C)] 98.6 F (37 C) (02/11 1221) Pulse Rate:  [85-94] 85 (02/11 1221) Resp:  [16-20] 16 (02/11 1221) BP: (141-185)/(81-127) 141/81 (02/11 1221) SpO2:  [95 %-100 %] 95 % (02/11 1221) Last BM Date: 07/03/20 General:   Appears weak and sick  Head:  Normocephalic and atraumatic. Eyes:   No icterus.   Conjunctiva pink. PERRLA. Ears:  Cannot assess Neck:  Supple; no masses or thyroidomegaly Abdomen:  Soft, nondistended, nontender. Normal bowel sounds. No appreciable masses or hepatomegaly.  No rebound or guarding.  Neurologic:  Alert and oriented x1;  Skin:  Intact without significant lesions or  rashes.Has wounds on back  Psych:  Awake but not communicating  LAB RESULTS: Recent Labs    07/01/20 0515 07/02/20 0648 07/02/20 1816 07/02/20 2054 07/03/20 0141  WBC 5.3 9.1  --   --  9.3  HGB 10.5* 8.8* 8.4* 7.9* 7.7*  HCT 31.0* 26.3*  --  23.4* 22.6*  PLT 124* 134*  --   --  124*   BMET Recent Labs    07/01/20 0515 07/02/20 0648 07/03/20 0141  NA 137 140 137  K 4.0 4.0 3.2*  CL 96* 98 98  CO2 21* 20* 16*  GLUCOSE 197* 228* 415*  BUN 62* 105* 121*  CREATININE 6.03* 8.66* 8.06*  CALCIUM 7.8* 7.1* 6.7*   LFT Recent Labs    07/01/20 0515 07/03/20 0141  PROT 5.5*  --   ALBUMIN 2.5* 2.3*  AST 39  --   ALT 15  --  ALKPHOS 47  --   BILITOT 1.0  --    PT/INR Recent Labs    07/01/20 1518  LABPROT 17.7*  INR 1.5*    STUDIES: US Venous Img Upper Uni Right(DVT)  Result Date: 07/02/2020 CLINICAL DATA:  75 year old male with right arm swelling. EXAM: RIGHT UPPER EXTREMITY VENOUS DOPPLER ULTRASOUND TECHNIQUE: Gray-scale sonography with graded compression, as well as color Doppler and duplex ultrasound were performed to evaluate the upper extremity deep venous system from the level of the subclavian vein and including the jugular, axillary, basilic, radial, ulnar and upper cephalic vein. Spectral Doppler was utilized to evaluate flow at rest and with distal augmentation maneuvers. COMPARISON:  None. FINDINGS: Contralateral Subclavian Vein: Not well visualized due to bandages from dialysis access. Internal Jugular Vein: Limited visualization. No evidence of thrombus. Normal compressibility, respiratory phasicity and response to augmentation. Subclavian Vein: No evidence of thrombus. Normal compressibility, respiratory phasicity and response to augmentation. Axillary Vein: Limited visualization.  No evidence of thrombus. Cephalic Vein: No evidence of thrombus. Normal compressibility, respiratory phasicity and response to augmentation. Basilic Vein: No evidence of thrombus.  Normal compressibility, respiratory phasicity and response to augmentation. Brachial Veins: No evidence of thrombus. Normal compressibility, respiratory phasicity and response to augmentation. Radial Veins: No evidence of thrombus. Normal compressibility, respiratory phasicity and response to augmentation. Ulnar Veins: No evidence of thrombus. Normal compressibility, respiratory phasicity and response to augmentation. Other Findings:  None visualized. IMPRESSION: No evidence of right upper extremity deep vein thrombosis. Limited visualization of the right internal jugular and right axillary veins. Ruthann Cancer, MD [facility] Electronically Signed   By: Ruthann Cancer MD   On: 07/02/2020 11:51   ECHOCARDIOGRAM COMPLETE  Result Date: 07/02/2020    ECHOCARDIOGRAM REPORT   Patient Name:   Scott Oconnor Date of Exam: 07/02/2020 Medical Rec #:  [id number]         Height:       70.0 in Accession #:    [id number]        Weight:       122.1 lb Date of Birth:  Mar 01, 1946         BSA:          1.693 m Patient Age:    13 years          BP:           183/113 mmHg Patient Gender: M                 HR:           99 bpm. Exam Location:  [facility] Procedure: 2D Echo, Color Doppler and Cardiac Doppler Indications:     I21.4 NSTEMI  History:         Patient has prior history of Echocardiogram examinations, most                  recent 01/29/2020. PVD, CKD; Risk Factors:Dyslipidemia and                  Hypertension. Pt tested positive for COVID-19 on 06/18/20.  Sonographer:     Charmayne Sheer RDCS (AE) Referring Phys:  [id number] Kate Sable Diagnosing Phys: Ida Rogue MD  Sonographer Comments: Suboptimal subcostal window. IMPRESSIONS  1. Left ventricular ejection fraction, by estimation, is 20 to 25%. The left ventricle has severely decreased function. The left ventricle demonstrates global hypokinesis, basal regions best preserved. Unable to exclude component of stress  cardiomyopathy. The left ventricular  internal cavity size was mildly dilated. Left ventricular diastolic parameters are consistent with Grade II diastolic dysfunction (pseudonormalization).  2. Right ventricular systolic function is normal. The right ventricular size is normal.  3. Mild mitral valve regurgitation.  4. Aortic valve regurgitation is mild to moderate. FINDINGS  Left Ventricle: Left ventricular ejection fraction, by estimation, is 20 to 25%. The left ventricle has severely decreased function. The left ventricle demonstrates global hypokinesis. The left ventricular internal cavity size was mildly dilated. There is no left ventricular hypertrophy. Left ventricular diastolic parameters are consistent with Grade II diastolic dysfunction (pseudonormalization). Right Ventricle: The right ventricular size is normal. No increase in right ventricular wall thickness. Right ventricular systolic function is normal. Left Atrium: Left atrial size was normal in size. Right Atrium: Right atrial size was normal in size. Pericardium: There is no evidence of pericardial effusion. Mitral Valve: The mitral valve is normal in structure. Mild mitral annular calcification. Mild mitral valve regurgitation. No evidence of mitral valve stenosis. MV peak gradient, 4.8 mmHg. The mean mitral valve gradient is 2.0 mmHg. Tricuspid Valve: The tricuspid valve is normal in structure. Tricuspid valve regurgitation is mild . No evidence of tricuspid stenosis. Aortic Valve: The aortic valve is normal in structure. Aortic valve regurgitation is mild to moderate. No aortic stenosis is present. Aortic valve mean gradient measures 1.0 mmHg. Aortic valve peak gradient measures 2.8 mmHg. Aortic valve area, by VTI measures 2.54 cm. Pulmonic Valve: The pulmonic valve was normal in structure. Pulmonic valve regurgitation is mild. No evidence of pulmonic stenosis. Aorta: The aortic root is normal in size and structure. Venous: The inferior vena cava  is normal in size with greater than 50% respiratory variability, suggesting right atrial pressure of 3 mmHg. IAS/Shunts: No atrial level shunt detected by color flow Doppler.  LEFT VENTRICLE PLAX 2D LVIDd:         4.90 cm      Diastology LVIDs:         4.00 cm      LV e' medial:    2.83 cm/s LV PW:         1.00 cm      LV E/e' medial:  37.8 LV IVS:        0.90 cm      LV e' lateral:   4.35 cm/s LVOT diam:     2.30 cm      LV E/e' lateral: 24.6 LV SV:         33 LV SV Index:   20 LVOT Area:     4.15 cm  LV Volumes (MOD) LV vol d, MOD A2C: 114.0 ml LV vol d, MOD A4C: 113.0 ml LV vol s, MOD A2C: 70.4 ml LV vol s, MOD A4C: 72.6 ml LV SV MOD A2C:     43.6 ml LV SV MOD A4C:     113.0 ml LV SV MOD BP:      37.4 ml RIGHT VENTRICLE RV Basal diam:  2.90 cm LEFT ATRIUM             Index       RIGHT ATRIUM           Index LA diam:        3.80 cm 2.25 cm/m  RA Area:     11.60 cm LA Vol (A2C):   43.7 ml 25.82 ml/m RA Volume:   28.90 ml  17.07 ml/m LA Vol (A4C):   47.5 ml 28.06 ml/m LA Biplane Vol: 48.7  ml 28.77 ml/m  AORTIC VALVE                   PULMONIC VALVE AV Area (Vmax):    2.72 cm    PV Vmax:       0.93 m/s AV Area (Vmean):   2.64 cm    PV Vmean:      52.300 cm/s AV Area (VTI):     2.54 cm    PV VTI:        0.117 m AV Vmax:           83.80 cm/s  PV Peak grad:  3.5 mmHg AV Vmean:          53.800 cm/s PV Mean grad:  1.0 mmHg AV VTI:            0.131 m AV Peak Grad:      2.8 mmHg AV Mean Grad:      1.0 mmHg LVOT Vmax:         54.80 cm/s LVOT Vmean:        34.200 cm/s LVOT VTI:          0.080 m LVOT/AV VTI ratio: 0.61  AORTA Ao Root diam: 3.10 cm MITRAL VALVE                TRICUSPID VALVE MV Area (PHT): 5.61 cm     TR Peak grad:   41.0 mmHg MV Area VTI:   1.80 cm     TR Vmax:        320.00 cm/s MV Peak grad:  4.8 mmHg MV Mean grad:  2.0 mmHg     SHUNTS MV Vmax:       1.09 m/s     Systemic VTI:  0.08 m MV Vmean:      64.8 cm/s    Systemic Diam: 2.30 cm MV Decel Time: 135 msec MV E velocity: 107.00 cm/s Ida Rogue MD Electronically signed by Ida Rogue MD Signature Date/Time: 07/02/2020/2:55:42 PM    Final       Impression / Plan:   Scott Oconnor is a 75 y.o. y/o male admitted with Covid pneumonia, encephalopathy NSTEMI, metabolic acidosis, end-stage renal disease on peritoneal dialysis being followed by cardiology.  I have been consulted for a drop in hemoglobin.  Was just recently discharged from the hospital when with Covid and was found to have drop in hemoglobin while being on Eliquis.  He was treated conservatively considering his comorbidities.  Impression: Anemia could be due to multiple reasons including sepsis, bone marrow suppression from Covid, could have had some blood loss through the GI tract as well .Diarrhea differentials include infection from COVID vs non covid related   Plan 1.  With multiple acute issues including an NSTEMI and Covid pneumonia the risks of anesthesia are significantly high.  Endoscopic evaluation should only be performed if having a severe acute bleed as evidenced by hematemesis or hematochezia.  In the interim suggest to monitor CBC and transfuse as needed.  If there is concern for an acute bleed consider tagged RBC scan to localize a source.  Suggest to continue PPI.I had a long discussion with patients daughter that he hs multiorgan failure which is associated with high mortality and presently he is very sick .  .   Dr Allen Norris will follow over the weekend  Thank you for involving me in the care of this patient.      LOS: 5 days   Scott Bellows, MD  07/03/2020, 2:20 PM

## 2020-07-03 NOTE — Progress Notes (Signed)
Made dr. Jimmye Norman osmolality resulted 361

## 2020-07-03 NOTE — Progress Notes (Signed)
Cross Cover Patient with hyperglycemia worsening overnight. Metabolic acidosis with bicarb deficit evident on chem panel, Sodium bicarb supplement started via feeding tube.   Beta-hydroybutyric acid not indicative of DKA Serum osmo is pending

## 2020-07-04 ENCOUNTER — Inpatient Hospital Stay: Payer: Medicare Other

## 2020-07-04 DIAGNOSIS — R630 Anorexia: Secondary | ICD-10-CM | POA: Diagnosis not present

## 2020-07-04 DIAGNOSIS — K922 Gastrointestinal hemorrhage, unspecified: Secondary | ICD-10-CM

## 2020-07-04 DIAGNOSIS — R778 Other specified abnormalities of plasma proteins: Secondary | ICD-10-CM | POA: Diagnosis not present

## 2020-07-04 DIAGNOSIS — I5022 Chronic systolic (congestive) heart failure: Secondary | ICD-10-CM | POA: Diagnosis not present

## 2020-07-04 DIAGNOSIS — I214 Non-ST elevation (NSTEMI) myocardial infarction: Secondary | ICD-10-CM | POA: Diagnosis not present

## 2020-07-04 DIAGNOSIS — R627 Adult failure to thrive: Secondary | ICD-10-CM | POA: Diagnosis not present

## 2020-07-04 LAB — BASIC METABOLIC PANEL
Anion gap: 22 — ABNORMAL HIGH (ref 5–15)
BUN: 141 mg/dL — ABNORMAL HIGH (ref 8–23)
CO2: 20 mmol/L — ABNORMAL LOW (ref 22–32)
Calcium: 6.7 mg/dL — ABNORMAL LOW (ref 8.9–10.3)
Chloride: 97 mmol/L — ABNORMAL LOW (ref 98–111)
Creatinine, Ser: 9.03 mg/dL — ABNORMAL HIGH (ref 0.61–1.24)
GFR, Estimated: 6 mL/min — ABNORMAL LOW (ref >60)
Glucose, Bld: 189 mg/dL — ABNORMAL HIGH (ref 70–99)
Potassium: 3.2 mmol/L — ABNORMAL LOW (ref 3.5–5.1)
Sodium: 139 mmol/L (ref 135–145)

## 2020-07-04 LAB — GLUCOSE, CAPILLARY
Glucose-Capillary: 197 mg/dL — ABNORMAL HIGH (ref 70–99)
Glucose-Capillary: 228 mg/dL — ABNORMAL HIGH (ref 70–99)
Glucose-Capillary: 145 mg/dL — ABNORMAL HIGH (ref 70–99)
Glucose-Capillary: 75 mg/dL (ref 70–99)
Glucose-Capillary: 70 mg/dL (ref 70–99)
Glucose-Capillary: 166 mg/dL — ABNORMAL HIGH (ref 70–99)

## 2020-07-04 LAB — CBC
HCT: 19.2 % — ABNORMAL LOW (ref 39.0–52.0)
Hemoglobin: 6.7 g/dL — ABNORMAL LOW (ref 13.0–17.0)
MCH: 34.4 pg — ABNORMAL HIGH (ref 26.0–34.0)
MCHC: 34.9 g/dL (ref 30.0–36.0)
MCV: 98.5 fL (ref 80.0–100.0)
Platelets: 108 10*3/uL — ABNORMAL LOW (ref 150–400)
RBC: 1.95 MIL/uL — ABNORMAL LOW (ref 4.22–5.81)
RDW: 18.5 % — ABNORMAL HIGH (ref 11.5–15.5)
WBC: 14.3 10*3/uL — ABNORMAL HIGH (ref 4.0–10.5)
nRBC: 7.6 % — ABNORMAL HIGH (ref 0.0–0.2)

## 2020-07-04 LAB — HEMOGLOBIN AND HEMATOCRIT, BLOOD
HCT: 24.6 % — ABNORMAL LOW (ref 39.0–52.0)
Hemoglobin: 8.6 g/dL — ABNORMAL LOW (ref 13.0–17.0)

## 2020-07-04 LAB — PREPARE RBC (CROSSMATCH)

## 2020-07-04 MED ORDER — DICYCLOMINE HCL 10 MG/5ML PO SOLN
20.0000 mg | Freq: Three times a day (TID) | ORAL | Status: DC | PRN
  Filled 2020-07-04 (×2): qty 10

## 2020-07-04 MED ORDER — SACCHAROMYCES BOULARDII 250 MG PO CAPS
250.0000 mg | ORAL_CAPSULE | Freq: Two times a day (BID) | ORAL | Status: DC
  Administered 2020-07-04 (×2): 250 mg
  Filled 2020-07-04 (×3): qty 1

## 2020-07-04 NOTE — Progress Notes (Signed)
Progress Note  Patient Name: Scott Oconnor Date of Encounter: 07/04/2020  [facility] Cardiologist: Ida Rogue, MD  Subjective   Sleep wake cycles backwards Daughter reports he is talkative and animated at night Just finishing peritoneal dialysis   Inpatient Medications    Scheduled Meds: . sodium chloride   Intravenous Once  . benzonatate  100 mg Oral Q8H  . carvedilol  6.25 mg Per Tube BID WC  . Chlorhexidine Gluconate Cloth  6 each Topical Daily  . feeding supplement (PROSource TF)  45 mL Per Tube BID  . fiber  1 packet Per Tube TID  . free water  30 mL Per Tube Q4H  . gentamicin cream  1 application Topical Daily  . Gerhardt's butt cream   Topical TID  . heparin  5,000 Units Subcutaneous Q8H  . hydrALAZINE  50 mg Oral Q8H  . insulin aspart  0-9 Units Subcutaneous Q4H  . isosorbide dinitrate  20 mg Oral TID  . LORazepam  1 mg Intravenous Once  . multivitamin  1 tablet Per Tube QHS  . pantoprazole (PROTONIX) IV  40 mg Intravenous Q12H  . saccharomyces boulardii  250 mg Per Tube BID  . sodium bicarbonate  1,300 mg Per Tube BID  . sodium chloride flush  3 mL Intravenous Q12H   Continuous Infusions: . dialysis solution 1.5% low-MG/low-CA    . feeding supplement (NEPRO CARB STEADY)    . piperacillin-tazobactam (ZOSYN)  IV 2.25 g (07/04/20 0555)   PRN Meds: acetaminophen (TYLENOL) oral liquid 160 mg/5 mL, albuterol, dicyclomine, guaiFENesin-dextromethorphan, hydrALAZINE, loperamide, morphine injection, nitroGLYCERIN, ondansetron **OR** ondansetron (ZOFRAN) IV, phenol   Vital Signs    Vitals:   07/04/20 0232 07/04/20 0324 07/04/20 0533 07/04/20 0823  BP: 137/85 135/83 (!) 142/88 137/85  Pulse: 80 80 85 80  Resp:  18  14  Temp: 97.7 F (36.5 C) 97.7 F (36.5 C) 97.7 F (36.5 C) 97.9 F (36.6 C)  TempSrc: Axillary Oral Oral Oral  SpO2: 98% 98% 100% 93%  Weight:      Height:        Intake/Output Summary (Last 24 hours) at 07/04/2020 0952 Last data  filed at 07/04/2020 0329 Gross per 24 hour  Intake 1445.31 ml  Output 0 ml  Net 1445.31 ml   Last 3 Weights 06/30/2020 06/29/2020 07/19/2020  Weight (lbs) 122 lb 2.2 oz 121 lb 4.1 oz 121 lb 4.1 oz  Weight (kg) 55.4 kg 55 kg 55 kg      Telemetry    NSR 07/04/2020   ECG    No new tracing  Physical Exam   Failure to thriive Chronically ill Scar left clavicle Peritoneal dialysis catheter No edema Fairly loud SEM   Labs    High Sensitivity Troponin:   Recent Labs  Lab 07/01/20 1255 07/01/20 1518 07/01/20 2105 07/01/20 2258 07/02/20 1816  TROPONINIHS 1,196* 1,207* 988* 905* 899*      Chemistry Recent Labs  Lab 06/29/20 [id number] 06/30/20 0515 07/01/20 0515 07/02/20 0648 07/03/20 0141 07/04/20 0028  NA 135 138 137 140 137 139  K 4.4 4.8 4.0 4.0 3.2* 3.2*  CL 95* 97* 96* 98 98 97*  CO2 18* 18* 21* 20* 16* 20*  GLUCOSE 123* 135* 197* 228* 415* 189*  BUN 71* 97* 62* 105* 121* 141*  CREATININE 7.49* 9.26* 6.03* 8.66* 8.06* 9.03*  CALCIUM 7.1* 7.7* 7.8* 7.1* 6.7* 6.7*  PROT 6.0*  6.1* 5.3* 5.5*  --   --   --  ALBUMIN 2.7*  2.7* 2.3* 2.5*  --  2.3*  --   AST 44*  44* 33 39  --   --   --   ALT '16  15 14 15  '$ --   --   --   ALKPHOS 49  51 42 47  --   --   --   BILITOT 1.2  1.2 1.1 1.0  --   --   --   GFRNONAA 7* 5* 9* 6* 6* 6*  ANIONGAP 22* 23* 20* 22* 23* 22*     Hematology Recent Labs  Lab 07/02/20 0648 07/02/20 1816 07/03/20 0141 07/03/20 2016 07/04/20 0028 07/04/20 0649  WBC 9.1  --  9.3  --  14.3*  --   RBC 2.70*  --  2.33*  --  1.95*  --   HGB 8.8*   < > 7.7* 6.6* 6.7* 8.6*  HCT 26.3*   < > 22.6* 19.5* 19.2* 24.6*  MCV 97.4  --  97.0  --  98.5  --   MCH 32.6  --  33.0  --  34.4*  --   MCHC 33.5  --  34.1  --  34.9  --   RDW 17.8*  --  18.0*  --  18.5*  --   PLT 134*  --  124*  --  108*  --    < > = values in this interval not displayed.    BNPNo results for input(s): BNP, PROBNP in the last 168 hours.   DDimer No results for input(s): DDIMER  in the last 168 hours.   Radiology    US Venous Img Upper Uni Right(DVT)  Result Date: 07/02/2020 CLINICAL DATA:  75 year old male with right arm swelling. EXAM: RIGHT UPPER EXTREMITY VENOUS DOPPLER ULTRASOUND TECHNIQUE: Gray-scale sonography with graded compression, as well as color Doppler and duplex ultrasound were performed to evaluate the upper extremity deep venous system from the level of the subclavian vein and including the jugular, axillary, basilic, radial, ulnar and upper cephalic vein. Spectral Doppler was utilized to evaluate flow at rest and with distal augmentation maneuvers. COMPARISON:  None. FINDINGS: Contralateral Subclavian Vein: Not well visualized due to bandages from dialysis access. Internal Jugular Vein: Limited visualization. No evidence of thrombus. Normal compressibility, respiratory phasicity and response to augmentation. Subclavian Vein: No evidence of thrombus. Normal compressibility, respiratory phasicity and response to augmentation. Axillary Vein: Limited visualization.  No evidence of thrombus. Cephalic Vein: No evidence of thrombus. Normal compressibility, respiratory phasicity and response to augmentation. Basilic Vein: No evidence of thrombus. Normal compressibility, respiratory phasicity and response to augmentation. Brachial Veins: No evidence of thrombus. Normal compressibility, respiratory phasicity and response to augmentation. Radial Veins: No evidence of thrombus. Normal compressibility, respiratory phasicity and response to augmentation. Ulnar Veins: No evidence of thrombus. Normal compressibility, respiratory phasicity and response to augmentation. Other Findings:  None visualized. IMPRESSION: No evidence of right upper extremity deep vein thrombosis. Limited visualization of the right internal jugular and right axillary veins. Ruthann Cancer, MD [facility] Electronically Signed   By: Ruthann Cancer MD    On: 07/02/2020 11:51   DG Chest Port 1 View  Result Date: 07/04/2020 CLINICAL DATA:  End stage renal disease with airspace consolidation EXAM: PORTABLE CHEST 1 VIEW COMPARISON:  July 01, 2020 FINDINGS: Dual lumen catheter position unchanged. Nasogastric tube tip and side port below the diaphragm. No pneumothorax. Left lower lobe consolidation persists. There is a new small right pleural effusion.  There is interstitial pulmonary edema throughout the lungs. Heart is mildly enlarged with pulmonary venous hypertension. No adenopathy. There is aortic atherosclerosis. No bone lesions. There are foci of calcification in the right carotid artery. IMPRESSION: Tube and catheter positions unchanged without pneumothorax. Persistent left lower lobe consolidation concerning for pneumonia. There is cardiomegaly with pulmonary vascular congestion. There is a right pleural effusion with pulmonary edema. Appearance is consistent with volume overload/degree of congestive heart failure. There is right carotid artery calcification. Aortic Atherosclerosis (ICD10-I70.0). Electronically Signed   By: Lowella Grip III M.D.   On: 07/04/2020 09:33   ECHOCARDIOGRAM COMPLETE  Result Date: 07/02/2020    ECHOCARDIOGRAM REPORT   Patient Name:   Scott Oconnor Date of Exam: 07/02/2020 Medical Rec #:  [id number]         Height:       70.0 in Accession #:    [id number]        Weight:       122.1 lb Date of Birth:  September 16, 1945         BSA:          1.693 m Patient Age:    75 years          BP:           183/113 mmHg Patient Gender: M                 HR:           99 bpm. Exam Location:  [facility] Procedure: 2D Echo, Color Doppler and Cardiac Doppler Indications:     I21.4 NSTEMI  History:         Patient has prior history of Echocardiogram examinations, most                  recent 01/29/2020. PVD, CKD; Risk Factors:Dyslipidemia and                  Hypertension. Pt tested positive for COVID-19 on 06/18/20.  Sonographer:     Charmayne Sheer RDCS  (AE) Referring Phys:  [id number] Kate Sable Diagnosing Phys: Ida Rogue MD  Sonographer Comments: Suboptimal subcostal window. IMPRESSIONS  1. Left ventricular ejection fraction, by estimation, is 20 to 25%. The left ventricle has severely decreased function. The left ventricle demonstrates global hypokinesis, basal regions best preserved. Unable to exclude component of stress cardiomyopathy. The left ventricular internal cavity size was mildly dilated. Left ventricular diastolic parameters are consistent with Grade II diastolic dysfunction (pseudonormalization).  2. Right ventricular systolic function is normal. The right ventricular size is normal.  3. Mild mitral valve regurgitation.  4. Aortic valve regurgitation is mild to moderate. FINDINGS  Left Ventricle: Left ventricular ejection fraction, by estimation, is 20 to 25%. The left ventricle has severely decreased function. The left ventricle demonstrates global hypokinesis. The left ventricular internal cavity size was mildly dilated. There is no left ventricular hypertrophy. Left ventricular diastolic parameters are consistent with Grade II diastolic dysfunction (pseudonormalization). Right Ventricle: The right ventricular size is normal. No increase in right ventricular wall thickness. Right ventricular systolic function is normal. Left Atrium: Left atrial size was normal in size. Right Atrium: Right atrial size was normal in size. Pericardium: There is no evidence of pericardial effusion. Mitral Valve: The mitral valve is normal in structure. Mild mitral annular calcification. Mild mitral valve regurgitation. No evidence of mitral valve stenosis. MV peak gradient, 4.8 mmHg. The mean mitral valve gradient is 2.0 mmHg. Tricuspid Valve: The tricuspid valve is  normal in structure. Tricuspid valve regurgitation is mild . No evidence of tricuspid stenosis. Aortic Valve: The aortic valve is normal in structure. Aortic valve regurgitation is mild to  moderate. No aortic stenosis is present. Aortic valve mean gradient measures 1.0 mmHg. Aortic valve peak gradient measures 2.8 mmHg. Aortic valve area, by VTI measures 2.54 cm. Pulmonic Valve: The pulmonic valve was normal in structure. Pulmonic valve regurgitation is mild. No evidence of pulmonic stenosis. Aorta: The aortic root is normal in size and structure. Venous: The inferior vena cava is normal in size with greater than 50% respiratory variability, suggesting right atrial pressure of 3 mmHg. IAS/Shunts: No atrial level shunt detected by color flow Doppler.  LEFT VENTRICLE PLAX 2D LVIDd:         4.90 cm      Diastology LVIDs:         4.00 cm      LV e' medial:    2.83 cm/s LV PW:         1.00 cm      LV E/e' medial:  37.8 LV IVS:        0.90 cm      LV e' lateral:   4.35 cm/s LVOT diam:     2.30 cm      LV E/e' lateral: 24.6 LV SV:         33 LV SV Index:   20 LVOT Area:     4.15 cm  LV Volumes (MOD) LV vol d, MOD A2C: 114.0 ml LV vol d, MOD A4C: 113.0 ml LV vol s, MOD A2C: 70.4 ml LV vol s, MOD A4C: 72.6 ml LV SV MOD A2C:     43.6 ml LV SV MOD A4C:     113.0 ml LV SV MOD BP:      37.4 ml RIGHT VENTRICLE RV Basal diam:  2.90 cm LEFT ATRIUM             Index       RIGHT ATRIUM           Index LA diam:        3.80 cm 2.25 cm/m  RA Area:     11.60 cm LA Vol (A2C):   43.7 ml 25.82 ml/m RA Volume:   28.90 ml  17.07 ml/m LA Vol (A4C):   47.5 ml 28.06 ml/m LA Biplane Vol: 48.7 ml 28.77 ml/m  AORTIC VALVE                   PULMONIC VALVE AV Area (Vmax):    2.72 cm    PV Vmax:       0.93 m/s AV Area (Vmean):   2.64 cm    PV Vmean:      52.300 cm/s AV Area (VTI):     2.54 cm    PV VTI:        0.117 m AV Vmax:           83.80 cm/s  PV Peak grad:  3.5 mmHg AV Vmean:          53.800 cm/s PV Mean grad:  1.0 mmHg AV VTI:            0.131 m AV Peak Grad:      2.8 mmHg AV Mean Grad:      1.0 mmHg LVOT Vmax:         54.80 cm/s LVOT Vmean:        34.200 cm/s LVOT VTI:  0.080 m LVOT/AV VTI ratio: 0.61  AORTA  Ao Root diam: 3.10 cm MITRAL VALVE                TRICUSPID VALVE MV Area (PHT): 5.61 cm     TR Peak grad:   41.0 mmHg MV Area VTI:   1.80 cm     TR Vmax:        320.00 cm/s MV Peak grad:  4.8 mmHg MV Mean grad:  2.0 mmHg     SHUNTS MV Vmax:       1.09 m/s     Systemic VTI:  0.08 m MV Vmean:      64.8 cm/s    Systemic Diam: 2.30 cm MV Decel Time: 135 msec MV E velocity: 107.00 cm/s Ida Rogue MD Electronically signed by Ida Rogue MD Signature Date/Time: 07/02/2020/2:55:42 PM    Final     Cardiac Studies   TTE (07/02/2020): 1. Left ventricular ejection fraction, by estimation, is 20 to 25%. The  left ventricle has severely decreased function. The left ventricle  demonstrates global hypokinesis, basal regions best preserved. Unable to  exclude component of stress  cardiomyopathy. The left ventricular internal cavity size was mildly  dilated. Left ventricular diastolic parameters are consistent with Grade  II diastolic dysfunction (pseudonormalization).  2. Right ventricular systolic function is normal. The right ventricular  size is normal.  3. Mild mitral valve regurgitation.  4. Aortic valve regurgitation is mild to moderate.   Patient Profile     75 y.o. male chronic HFrEF and end-stage renal disease, whom we are seeing due to elevated troponin.  Assessment & Plan    Elevated troponin: in setting of renal failure not clear if he's had an acute event ECG ok troponin elevated no real trend TTE with known moderate decrease in EF slightly worse now 20-25% IV heparin changed to [place] yesterday Volume control with dialysis  ON  81 mg ASA No further aggressive cardiac Rx given co morbidities dialysis and COVID 19   Chronic HFrEF: contineu dialysis , coreg , hydralazine and isordil ACE held with ESRD Volume exam is somewhat challenging though patient does not appear massively volume overloaded.  ESRD: finishing peritoneal dialysis this am   COVID-19 infection:per hospitalist / ID      For questions or updates, please contact [facility] Please consult [email or web address] for contact info under [facility].    Signed, Jenkins Rouge, MD  07/04/2020, 9:52 AM

## 2020-07-04 NOTE — Progress Notes (Signed)
[facility]  ROUNDING NOTE   Subjective:   Family at bedside.  DNR  Discussed with family the plan of care and goals of care. Patient's family may have an unrealistic opinion about rate and timeline for recovery. Patient is ill.   Peritoneal dialysis last night. Negative ultrafiltration  Objective:  Vital signs in last 24 hours:  Temp:  [97.7 F (36.5 C)-99.2 F (37.3 C)] 99.2 F (37.3 C) (02/12 1602) Pulse Rate:  [41-85] 79 (02/12 1602) Resp:  [14-18] 16 (02/12 1602) BP: (125-160)/(68-88) 139/83 (02/12 1602) SpO2:  [87 %-100 %] 87 % (02/12 1602)  Weight change:  Filed Weights   07/20/2020 1636 06/29/20 0500 06/30/20 0500  Weight: 55 kg 55 kg 55.4 kg    Intake/Output: I/O last 3 completed shifts: In: 1445.3 [I.V.:106.3; NG/GT:1289; IV Piggyback:50] Out: 0    Intake/Output this shift:  No intake/output data recorded.  Physical Exam: General: Cachectic. Laying in bed.   Head: Dry mucosal membranes  Eyes: Anicteric, PERRL  Neck: Supple, trachea midline  Lungs:  +upper respiratory sounds  Heart: Regular rate and rhythm  Abdomen:  Soft, nontender, scaffoid  Extremities: no peripheral edema.  Neurologic: Responds with yes and no head nods. Tracking with eyes  Skin: No lesions  Access: PD catheter, LIJ permcath    Basic Metabolic Panel: Recent Labs  Lab 06/29/20 0517 06/30/20 0515 06/30/20 1035 06/30/20 1702 07/01/20 0515 07/02/20 0648 07/02/20 1816 07/03/20 0141 07/04/20 0028  NA  --  138  --   --  137 140  --  137 139  K  --  4.8  --   --  4.0 4.0  --  3.2* 3.2*  CL  --  97*  --   --  96* 98  --  98 97*  CO2  --  18*  --   --  21* 20*  --  16* 20*  GLUCOSE  --  135*  --   --  197* 228*  --  415* 189*  BUN  --  97*  --   --  62* 105*  --  121* 141*  CREATININE  --  9.26*  --   --  6.03* 8.66*  --  8.06* 9.03*  CALCIUM  --  7.7*  --   --  7.8* 7.1*  --  6.7* 6.7*  MG 2.4 2.5*  --  2.2 2.2  --   --   --   --   PHOS 9.0* 12.3* 12.7* 6.9* 8.8*   --  10.8*  --   --     Liver Function Tests: Recent Labs  Lab 06/29/20 0213 06/30/20 0515 07/01/20 0515 07/03/20 0141  AST 44*  44* 33 39  --   ALT '16  15 14 15  '$ --   ALKPHOS 49  51 42 47  --   BILITOT 1.2  1.2 1.1 1.0  --   PROT 6.0*  6.1* 5.3* 5.5*  --   ALBUMIN 2.7*  2.7* 2.3* 2.5* 2.3*   Recent Labs  Lab 06/29/20 0213  LIPASE 142*   Recent Labs  Lab 06/29/20 0517  AMMONIA 14    CBC: Recent Labs  Lab 07/16/2020 1725 06/29/20 0213 06/30/20 0515 07/01/20 0515 07/02/20 0648 07/02/20 1816 07/02/20 2054 07/03/20 0141 07/03/20 2016 07/04/20 0028 07/04/20 0649  WBC 10.8* 11.1* 6.5 5.3 9.1  --   --  9.3  --  14.3*  --   NEUTROABS 9.1* 9.2*  --   --   --   --   --   --   --   --   --  HGB 10.7* 10.7* 10.6* 10.5* 8.8*   < > 7.9* 7.7* 6.6* 6.7* 8.6*  HCT 31.4* 31.9* 31.8* 31.0* 26.3*  --  23.4* 22.6* 19.5* 19.2* 24.6*  MCV 95.7 96.4 96.7 95.1 97.4  --   --  97.0  --  98.5  --   PLT 128* 134* 112* 124* 134*  --   --  124*  --  108*  --    < > = values in this interval not displayed.    Cardiac Enzymes: Recent Labs  Lab 07/06/2020 1725  CKTOTAL 341    BNP: Invalid input(s): POCBNP  CBG: Recent Labs  Lab 07/03/20 2026 07/03/20 2321 07/04/20 0326 07/04/20 0820 07/04/20 1156  GLUCAP 137* 155* 197* 228* 145*    Microbiology: Results for orders placed or performed during the hospital encounter of 07/01/2020  Blood culture (routine x 2)     Status: None   Collection Time: 06/27/2020  5:26 PM   Specimen: BLOOD  Result Value Ref Range Status   Specimen Description BLOOD RIGHT ANTECUBITAL  Final   Special Requests   Final    BOTTLES DRAWN AEROBIC AND ANAEROBIC Blood Culture results may not be optimal due to an inadequate volume of blood received in culture bottles   Culture   Final    NO GROWTH 5 DAYS Performed at [facility], [address] [place], [place] [address]    Report Status 07/03/2020 FINAL  Final  Blood culture (routine x 2)      Status: None   Collection Time: 06/27/2020  5:26 PM   Specimen: BLOOD  Result Value Ref Range Status   Specimen Description BLOOD LEFT ANTECUBITAL  Final   Special Requests   Final    BOTTLES DRAWN AEROBIC AND ANAEROBIC Blood Culture results may not be optimal due to an inadequate volume of blood received in culture bottles   Culture   Final    NO GROWTH 5 DAYS Performed at [facility], [address] [place], [place] [address]    Report Status 07/03/2020 FINAL  Final  C Difficile Quick Screen w PCR reflex     Status: None   Collection Time: 06/30/20  5:08 PM   Specimen: STOOL  Result Value Ref Range Status   C Diff antigen NEGATIVE NEGATIVE Final   C Diff toxin NEGATIVE NEGATIVE Final   C Diff interpretation No C. difficile detected.  Final    Comment: Performed at [facility], [address] [place], [place] [address]    Coagulation Studies: No results for input(s): LABPROT, INR in the last 72 hours.  Urinalysis: No results for input(s): COLORURINE, LABSPEC, PHURINE, GLUCOSEU, HGBUR, BILIRUBINUR, KETONESUR, PROTEINUR, UROBILINOGEN, NITRITE, LEUKOCYTESUR in the last 72 hours.  Invalid input(s): APPERANCEUR    Imaging: DG Chest Port 1 View  Result Date: 07/04/2020 CLINICAL DATA:  End stage renal disease with airspace consolidation EXAM: PORTABLE CHEST 1 VIEW COMPARISON:  July 01, 2020 FINDINGS: Dual lumen catheter position unchanged. Nasogastric tube tip and side port below the diaphragm. No pneumothorax. Left lower lobe consolidation persists. There is a new small right pleural effusion. There is interstitial pulmonary edema throughout the lungs. Heart is mildly enlarged with pulmonary venous hypertension. No adenopathy. There is aortic atherosclerosis. No bone lesions. There are foci of calcification in the right carotid artery. IMPRESSION: Tube and catheter positions unchanged without pneumothorax. Persistent left lower lobe consolidation concerning for  pneumonia. There is cardiomegaly with pulmonary vascular congestion. There is a right pleural  effusion with pulmonary edema. Appearance is consistent with volume overload/degree of congestive heart failure. There is right carotid artery calcification. Aortic Atherosclerosis (ICD10-I70.0). Electronically Signed   By: Lowella Grip III M.D.   On: 07/04/2020 09:33     Medications:   . dialysis solution 1.5% low-MG/low-CA    . feeding supplement (NEPRO CARB STEADY)    . piperacillin-tazobactam (ZOSYN)  IV 2.25 g (07/04/20 1457)   . sodium chloride   Intravenous Once  . benzonatate  100 mg Oral Q8H  . carvedilol  6.25 mg Per Tube BID WC  . Chlorhexidine Gluconate Cloth  6 each Topical Daily  . feeding supplement (PROSource TF)  45 mL Per Tube BID  . fiber  1 packet Per Tube TID  . free water  30 mL Per Tube Q4H  . gentamicin cream  1 application Topical Daily  . Gerhardt's butt cream   Topical TID  . heparin  5,000 Units Subcutaneous Q8H  . hydrALAZINE  50 mg Oral Q8H  . insulin aspart  0-9 Units Subcutaneous Q4H  . isosorbide dinitrate  20 mg Oral TID  . LORazepam  1 mg Intravenous Once  . multivitamin  1 tablet Per Tube QHS  . pantoprazole (PROTONIX) IV  40 mg Intravenous Q12H  . saccharomyces boulardii  250 mg Per Tube BID  . sodium bicarbonate  1,300 mg Per Tube BID  . sodium chloride flush  3 mL Intravenous Q12H   acetaminophen (TYLENOL) oral liquid 160 mg/5 mL, albuterol, dicyclomine, guaiFENesin-dextromethorphan, hydrALAZINE, loperamide, morphine injection, nitroGLYCERIN, ondansetron **OR** ondansetron (ZOFRAN) IV, phenol  Assessment/ Plan:  [name] is a 75 y.o. [place] male with end stage renal disease on peritoneal dialysis, hypertension, AAA repair, congestive heart failure, peripheral vascular disease, raynaud's syndrome, hyperlipidemia  was admitted on 06/18/2020 for COVID-19 pneumonia  [facility] 52.5kg CCPD 9 hours 5 exchanges 2059m  fills  #. ESRD Unable to tolerate hemodialyss during treatment this admission Peritoneal dialysis tonight.   #. Failure to thrive Restart tube fees  #. Anemia of CKD: hemoglobin 8.6 Low threshold for transfusion - EPO  # Secondary hyperparathyroidism  - no indication for binders.    LOS: 6 Leeanna Slaby 2/12/20224:29 PM

## 2020-07-04 NOTE — Progress Notes (Signed)
Attempted to place Pt on PD at end of shift. Family feels its too early. Kenney Houseman, daughter and Caregiver stated she would be back at 2000 and would do his care.  On call nurse came to hospital  After charge nurse and Dr. Juleen China requested at 630pm  Attempted to put Pt on PD. Family members bedside but caregiver, Kenney Houseman (Daughter) not present. Phone call to Ms. Kenney Houseman, stated she  feels it is too early and would like him placed on closer to 10 PM. No nurse available at that time for PD.   Mr. Schalow was not placed on PD by this nurse. Family will speak with Dr. Juleen China in the morning. Equipment and supplies remain in the room at this time due it being a Covid Room and supplies will stay with Patient.

## 2020-07-04 NOTE — Progress Notes (Signed)
PROGRESS NOTE    Scott Oconnor  [id number] DOB: [birth year] DOA: 07/08/2020 PCP: [name], MD    Assessment & Plan:   Active Problems:   ESRD (end stage renal disease) (HCC)   Chronic HFrEF (heart failure with reduced ejection fraction) (HCC)   Failure to thrive in adult   Non-ST elevation (NSTEMI) myocardial infarction [facility])  NSTEMI: troponins elevated but trending down. Continue on coreg, hydralazine & isosorbide dinitrate. D/c IV heparin. Echo shows EF 20-25%, grade II diastolic dysfunction & global hypokinesis. Continue on tele. Cardio following and recs apprec  Severe sepsis: secondary to COVID-19 & bacterial pneumonia. Completed remdesivir & steroid course. Continue on IV zosyn x 7 days. Pro-cal 8.03. Encourage incentive spirometry   Recent GI bleed: eliquis has been d/c. S/p 1 unit of pRBCs transfused. Repeat H&H are trending up. Will transfuse if Hb <7.0. Fecal occult positive. Not currently an EGD/colonoscopy candidate. GI following and recs apprec   Metabolic encephalopathy: continue w/ supportive care. Labile.   ESRD: on PD at home. Continue on dialysis as per nephro   Failure to thrive: w/ poor po intake. Unchanged from day prior. Has been declining since 1/20 as per pt's daughter. Continue w/ tube feeds   Hypokalemia: will be managed w/ dialysis as per nephro   Hyperglycemia: no hx of DM, likely secondary to tube feeds & steroids. Completed steroid course 07/03/20. Continue on SSI w/ accuchecks   Thrombocytopenia: etiology unclear, possibly secondary to [place]. Will continue to monitor   ACD: likely secondary to ESRD & GI bleed. S/p 1 unit of pRBCs transfused. Repeat H&H are trending up   Underweight: BMI 17.5. Continue w/ tube feeds   DVT prophylaxis: SCDs Code Status: DNR/DNI Family Communication: discussed pt's care w/ pt's children at bedside and answered their  questions  Disposition Plan: unclear.   Level of care: Progressive Cardiac    Status is: Inpatient  Remains inpatient appropriate because:Altered mental status, IV treatments appropriate due to intensity of illness or inability to take PO and Inpatient level of care appropriate due to severity of illness   Dispo: The patient is from: Home              Anticipated d/c is to: Home              Anticipated d/c date is: > 3 days              Patient currently is not medically stable to d/c.   Difficult to place patient No     Consultants:   nephro    Procedures:    Antimicrobials: zosyn    Subjective: Pt is lethargic, unchanged from day prior.   Objective: Vitals:   07/04/20 0225 07/04/20 0232 07/04/20 0324 07/04/20 0533  BP: (!) 141/78 137/85 135/83 (!) 142/88  Pulse: (!) 41 80 80 85  Resp:   18   Temp: 97.7 F (36.5 C) 97.7 F (36.5 C) 97.7 F (36.5 C) 97.7 F (36.5 C)  TempSrc: Oral Axillary Oral Oral  SpO2: 96% 98% 98% 100%  Weight:      Height:        Intake/Output Summary (Last 24 hours) at 07/04/2020 0814 Last data filed at 07/04/2020 0329 Gross per 24 hour  Intake 1445.31 ml  Output 0 ml  Net 1445.31 ml   Filed Weights   07/06/2020 1636 06/29/20 0500 06/30/20 0500  Weight: 55 kg 55 kg 55.4 kg    Examination:  General exam: Appears uncomfortable  Respiratory  system: decreased breath sounds b/l. No wheezes Cardiovascular system: S1/S2+. No gallops or clicks  Gastrointestinal system: Abd is soft, NT, ND & hyperactive bowel sounds  Central nervous system: Lethargic   Psychiatry: Judgement and insight appear abnormal     Data Reviewed: I have personally reviewed following labs and imaging studies  CBC: Recent Labs  Lab 07/06/2020 1725 06/29/20 0213 06/30/20 0515 07/01/20 0515 07/02/20 [id number] 07/02/20 1816 07/02/20 2054 07/03/20 0141 07/03/20 2016 07/04/20 0028 07/04/20 0649  WBC 10.8* 11.1* 6.5 5.3 9.1  --   --  9.3  --  14.3*  --   NEUTROABS 9.1* 9.2*  --   --   --   --   --   --   --   --   --   HGB 10.7* 10.7*  10.6* 10.5* 8.8*   < > 7.9* 7.7* 6.6* 6.7* 8.6*  HCT 31.4* 31.9* 31.8* 31.0* 26.3*  --  23.4* 22.6* 19.5* 19.2* 24.6*  MCV 95.7 96.4 96.7 95.1 97.4  --   --  97.0  --  98.5  --   PLT 128* 134* 112* 124* 134*  --   --  124*  --  108*  --    < > = values in this interval not displayed.   Basic Metabolic Panel: Recent Labs  Lab 06/29/20 0517 06/30/20 0515 06/30/20 1035 06/30/20 1702 07/01/20 0515 07/02/20 0648 07/02/20 1816 07/03/20 0141 07/04/20 0028  NA  --  138  --   --  137 140  --  137 139  K  --  4.8  --   --  4.0 4.0  --  3.2* 3.2*  CL  --  97*  --   --  96* 98  --  98 97*  CO2  --  18*  --   --  21* 20*  --  16* 20*  GLUCOSE  --  135*  --   --  197* 228*  --  415* 189*  BUN  --  97*  --   --  62* 105*  --  121* 141*  CREATININE  --  9.26*  --   --  6.03* 8.66*  --  8.06* 9.03*  CALCIUM  --  7.7*  --   --  7.8* 7.1*  --  6.7* 6.7*  MG 2.4 2.5*  --  2.2 2.2  --   --   --   --   PHOS 9.0* 12.3* 12.7* 6.9* 8.8*  --  10.8*  --   --    GFR: Estimated Creatinine Clearance: 5.7 mL/min (A) (by C-G formula based on SCr of 9.03 mg/dL (H)). Liver Function Tests: Recent Labs  Lab 06/29/20 0213 06/30/20 0515 07/01/20 0515 07/03/20 0141  AST 44*  44* 33 39  --   ALT '16  15 14 15  '$ --   ALKPHOS 49  51 42 47  --   BILITOT 1.2  1.2 1.1 1.0  --   PROT 6.0*  6.1* 5.3* 5.5*  --   ALBUMIN 2.7*  2.7* 2.3* 2.5* 2.3*   Recent Labs  Lab 06/29/20 0213  LIPASE 142*   Recent Labs  Lab 06/29/20 0517  AMMONIA 14   Coagulation Profile: Recent Labs  Lab 06/29/20 0213 07/01/20 1518  INR 1.2 1.5*   Cardiac Enzymes: Recent Labs  Lab 07/11/2020 1725  CKTOTAL 341   BNP (last 3 results) No results for input(s): PROBNP in the last 8760 hours. HbA1C: No results for input(s):  HGBA1C in the last 72 hours. CBG: Recent Labs  Lab 07/03/20 1219 07/03/20 1602 07/03/20 2026 07/03/20 2321 07/04/20 0326  GLUCAP 147* 139* 137* 155* 197*   Lipid Profile: No results for input(s):  CHOL, HDL, LDLCALC, TRIG, CHOLHDL, LDLDIRECT in the last 72 hours. Thyroid Function Tests: No results for input(s): TSH, T4TOTAL, FREET4, T3FREE, THYROIDAB in the last 72 hours. Anemia Panel: No results for input(s): VITAMINB12, FOLATE, FERRITIN, TIBC, IRON, RETICCTPCT in the last 72 hours. Sepsis Labs: Recent Labs  Lab 07/15/2020 1725 06/29/20 0213 06/29/20 0517 07/02/20 1019  LATICACIDVEN 1.5 2.7* 1.8 2.0*    Recent Results (from the past 240 hour(s))  Culture, blood (x 2)     Status: None   Collection Time: 06/24/20 10:00 PM   Specimen: BLOOD  Result Value Ref Range Status   Specimen Description BLOOD BLOOD LEFT HAND  Final   Special Requests   Final    BOTTLES DRAWN AEROBIC ONLY Blood Culture results may not be optimal due to an inadequate volume of blood received in culture bottles   Culture   Final    NO GROWTH 5 DAYS Performed at [facility], [address] [place], [place] [address]    Report Status 06/29/2020 FINAL  Final  Culture, blood (x 2)     Status: None   Collection Time: 06/24/20 10:01 PM   Specimen: BLOOD  Result Value Ref Range Status   Specimen Description BLOOD BLOOD RIGHT HAND  Final   Special Requests   Final    BOTTLES DRAWN AEROBIC ONLY Blood Culture results may not be optimal due to an inadequate volume of blood received in culture bottles   Culture   Final    NO GROWTH 5 DAYS Performed at [facility], [address] [place], [place] [address]    Report Status 06/29/2020 FINAL  Final  MRSA PCR Screening     Status: None   Collection Time: 06/25/20  6:27 PM   Specimen: Nasopharyngeal  Result Value Ref Range Status   MRSA by PCR NEGATIVE NEGATIVE Final    Comment:        The GeneXpert MRSA Assay (FDA approved for NASAL specimens only), is one component of a comprehensive MRSA colonization surveillance program. It is not intended to diagnose MRSA infection nor to guide or monitor treatment for MRSA  infections. Performed at [facility], [address] [place], [place] [address]   Blood culture (routine x 2)     Status: None   Collection Time: 07/20/2020  5:26 PM   Specimen: BLOOD  Result Value Ref Range Status   Specimen Description BLOOD RIGHT ANTECUBITAL  Final   Special Requests   Final    BOTTLES DRAWN AEROBIC AND ANAEROBIC Blood Culture results may not be optimal due to an inadequate volume of blood received in culture bottles   Culture   Final    NO GROWTH 5 DAYS Performed at [facility], [address] [place],  [address]    Report Status 07/03/2020 FINAL  Final  Blood culture (routine x 2)     Status: None   Collection Time: 06/27/2020  5:26 PM   Specimen: BLOOD  Result Value Ref Range Status   Specimen Description BLOOD LEFT ANTECUBITAL  Final   Special Requests   Final    BOTTLES DRAWN AEROBIC AND ANAEROBIC Blood Culture results may not be optimal due to an inadequate volume of blood received in culture bottles   Culture  Final    NO GROWTH 5 DAYS Performed at [facility], [address] [place], [place] [address]    Report Status 07/03/2020 FINAL  Final  C Difficile Quick Screen w PCR reflex     Status: None   Collection Time: 06/30/20  5:08 PM   Specimen: STOOL  Result Value Ref Range Status   C Diff antigen NEGATIVE NEGATIVE Final   C Diff toxin NEGATIVE NEGATIVE Final   C Diff interpretation No C. difficile detected.  Final    Comment: Performed at [facility], [address] [place], [place] [address]         Radiology Studies: US Venous Img Upper Uni Right(DVT)  Result Date: 07/02/2020 CLINICAL DATA:  75 year old male with right arm swelling. EXAM: RIGHT UPPER EXTREMITY VENOUS DOPPLER ULTRASOUND TECHNIQUE: Gray-scale sonography with graded compression, as well as color Doppler and duplex ultrasound were performed to evaluate the upper extremity deep venous system from the level of the subclavian vein  and including the jugular, axillary, basilic, radial, ulnar and upper cephalic vein. Spectral Doppler was utilized to evaluate flow at rest and with distal augmentation maneuvers. COMPARISON:  None. FINDINGS: Contralateral Subclavian Vein: Not well visualized due to bandages from dialysis access. Internal Jugular Vein: Limited visualization. No evidence of thrombus. Normal compressibility, respiratory phasicity and response to augmentation. Subclavian Vein: No evidence of thrombus. Normal compressibility, respiratory phasicity and response to augmentation. Axillary Vein: Limited visualization.  No evidence of thrombus. Cephalic Vein: No evidence of thrombus. Normal compressibility, respiratory phasicity and response to augmentation. Basilic Vein: No evidence of thrombus. Normal compressibility, respiratory phasicity and response to augmentation. Brachial Veins: No evidence of thrombus. Normal compressibility, respiratory phasicity and response to augmentation. Radial Veins: No evidence of thrombus. Normal compressibility, respiratory phasicity and response to augmentation. Ulnar Veins: No evidence of thrombus. Normal compressibility, respiratory phasicity and response to augmentation. Other Findings:  None visualized. IMPRESSION: No evidence of right upper extremity deep vein thrombosis. Limited visualization of the right internal jugular and right axillary veins. Ruthann Cancer, MD [facility] Electronically Signed   By: Ruthann Cancer MD   On: 07/02/2020 11:51   ECHOCARDIOGRAM COMPLETE  Result Date: 07/02/2020    ECHOCARDIOGRAM REPORT   Patient Name:   MATS HEASTER Date of Exam: 07/02/2020 Medical Rec #:  [id number]         Height:       70.0 in Accession #:    [id number]        Weight:       122.1 lb Date of Birth:  02-27-1946         BSA:          1.693 m Patient Age:    75 years          BP:           183/113 mmHg Patient Gender: M                  HR:           99 bpm. Exam Location:  [facility] Procedure: 2D Echo, Color Doppler and Cardiac Doppler Indications:     I21.4 NSTEMI  History:         Patient has prior history of Echocardiogram examinations, most                  recent 01/29/2020. PVD, CKD; Risk Factors:Dyslipidemia and  Hypertension. Pt tested positive for COVID-19 on 06/18/20.  Sonographer:     Charmayne Sheer RDCS (AE) Referring Phys:  [id number] Kate Sable Diagnosing Phys: Ida Rogue MD  Sonographer Comments: Suboptimal subcostal window. IMPRESSIONS  1. Left ventricular ejection fraction, by estimation, is 20 to 25%. The left ventricle has severely decreased function. The left ventricle demonstrates global hypokinesis, basal regions best preserved. Unable to exclude component of stress cardiomyopathy. The left ventricular internal cavity size was mildly dilated. Left ventricular diastolic parameters are consistent with Grade II diastolic dysfunction (pseudonormalization).  2. Right ventricular systolic function is normal. The right ventricular size is normal.  3. Mild mitral valve regurgitation.  4. Aortic valve regurgitation is mild to moderate. FINDINGS  Left Ventricle: Left ventricular ejection fraction, by estimation, is 20 to 25%. The left ventricle has severely decreased function. The left ventricle demonstrates global hypokinesis. The left ventricular internal cavity size was mildly dilated. There is no left ventricular hypertrophy. Left ventricular diastolic parameters are consistent with Grade II diastolic dysfunction (pseudonormalization). Right Ventricle: The right ventricular size is normal. No increase in right ventricular wall thickness. Right ventricular systolic function is normal. Left Atrium: Left atrial size was normal in size. Right Atrium: Right atrial size was normal in size. Pericardium: There is no evidence of pericardial effusion. Mitral Valve: The mitral valve is normal in structure. Mild mitral annular  calcification. Mild mitral valve regurgitation. No evidence of mitral valve stenosis. MV peak gradient, 4.8 mmHg. The mean mitral valve gradient is 2.0 mmHg. Tricuspid Valve: The tricuspid valve is normal in structure. Tricuspid valve regurgitation is mild . No evidence of tricuspid stenosis. Aortic Valve: The aortic valve is normal in structure. Aortic valve regurgitation is mild to moderate. No aortic stenosis is present. Aortic valve mean gradient measures 1.0 mmHg. Aortic valve peak gradient measures 2.8 mmHg. Aortic valve area, by VTI measures 2.54 cm. Pulmonic Valve: The pulmonic valve was normal in structure. Pulmonic valve regurgitation is mild. No evidence of pulmonic stenosis. Aorta: The aortic root is normal in size and structure. Venous: The inferior vena cava is normal in size with greater than 50% respiratory variability, suggesting right atrial pressure of 3 mmHg. IAS/Shunts: No atrial level shunt detected by color flow Doppler.  LEFT VENTRICLE PLAX 2D LVIDd:         4.90 cm      Diastology LVIDs:         4.00 cm      LV e' medial:    2.83 cm/s LV PW:         1.00 cm      LV E/e' medial:  37.8 LV IVS:        0.90 cm      LV e' lateral:   4.35 cm/s LVOT diam:     2.30 cm      LV E/e' lateral: 24.6 LV SV:         33 LV SV Index:   20 LVOT Area:     4.15 cm  LV Volumes (MOD) LV vol d, MOD A2C: 114.0 ml LV vol d, MOD A4C: 113.0 ml LV vol s, MOD A2C: 70.4 ml LV vol s, MOD A4C: 72.6 ml LV SV MOD A2C:     43.6 ml LV SV MOD A4C:     113.0 ml LV SV MOD BP:      37.4 ml RIGHT VENTRICLE RV Basal diam:  2.90 cm LEFT ATRIUM  Index       RIGHT ATRIUM           Index LA diam:        3.80 cm 2.25 cm/m  RA Area:     11.60 cm LA Vol (A2C):   43.7 ml 25.82 ml/m RA Volume:   28.90 ml  17.07 ml/m LA Vol (A4C):   47.5 ml 28.06 ml/m LA Biplane Vol: 48.7 ml 28.77 ml/m  AORTIC VALVE                   PULMONIC VALVE AV Area (Vmax):    2.72 cm    PV Vmax:       0.93 m/s AV Area (Vmean):   2.64 cm    PV  Vmean:      52.300 cm/s AV Area (VTI):     2.54 cm    PV VTI:        0.117 m AV Vmax:           83.80 cm/s  PV Peak grad:  3.5 mmHg AV Vmean:          53.800 cm/s PV Mean grad:  1.0 mmHg AV VTI:            0.131 m AV Peak Grad:      2.8 mmHg AV Mean Grad:      1.0 mmHg LVOT Vmax:         54.80 cm/s LVOT Vmean:        34.200 cm/s LVOT VTI:          0.080 m LVOT/AV VTI ratio: 0.61  AORTA Ao Root diam: 3.10 cm MITRAL VALVE                TRICUSPID VALVE MV Area (PHT): 5.61 cm     TR Peak grad:   41.0 mmHg MV Area VTI:   1.80 cm     TR Vmax:        320.00 cm/s MV Peak grad:  4.8 mmHg MV Mean grad:  2.0 mmHg     SHUNTS MV Vmax:       1.09 m/s     Systemic VTI:  0.08 m MV Vmean:      64.8 cm/s    Systemic Diam: 2.30 cm MV Decel Time: 135 msec MV E velocity: 107.00 cm/s Ida Rogue MD Electronically signed by Ida Rogue MD Signature Date/Time: 07/02/2020/2:55:42 PM    Final         Scheduled Meds: . sodium chloride   Intravenous Once  . benzonatate  100 mg Oral Q8H  . carvedilol  6.25 mg Per Tube BID WC  . Chlorhexidine Gluconate Cloth  6 each Topical Daily  . feeding supplement (PROSource TF)  45 mL Per Tube BID  . fiber  1 packet Per Tube TID  . free water  30 mL Per Tube Q4H  . gentamicin cream  1 application Topical Daily  . Gerhardt's butt cream   Topical TID  . heparin  5,000 Units Subcutaneous Q8H  . hydrALAZINE  50 mg Oral Q8H  . insulin aspart  0-9 Units Subcutaneous Q4H  . isosorbide dinitrate  20 mg Oral TID  . LORazepam  1 mg Intravenous Once  . multivitamin  1 tablet Per Tube QHS  . pantoprazole (PROTONIX) IV  40 mg Intravenous Q12H  . saccharomyces boulardii  250 mg Per Tube BID  . sodium bicarbonate  1,300 mg Per Tube BID  . sodium chloride  flush  3 mL Intravenous Q12H   Continuous Infusions: . dialysis solution 1.5% low-MG/low-CA    . feeding supplement (NEPRO CARB STEADY)    . piperacillin-tazobactam (ZOSYN)  IV 2.25 g (07/04/20 0555)     LOS: 6 days    Time  spent: 33 mins    Wyvonnia Dusky, MD [facility] Pager [phone]  If 7PM-7AM, please contact night-coverage 07/04/2020, 8:14 AM

## 2020-07-04 NOTE — Progress Notes (Signed)
Lucilla Lame, MD [facility]   [address] [place], [place] [address] Phone: [phone] Fax : [phone]   Subjective: The patient is laying in bed in no apparent distress with an NG tube in place.  The family is by the bedside and states that he has been having yellow and green stools without any sign of bleeding.  The patient had a unit of blood yesterday and his blood has trended:  Component     Latest Ref Rng & Units 07/03/2020 07/04/2020 07/04/2020         8:16 PM 12:28 AM  6:49 AM  Hemoglobin     13.0 - 17.0 g/dL 6.6 (L) 6.7 (L) 8.6 (L)  HCT     39.0 - 52.0 % 19.5 (L) 19.2 (L) 24.6 (L)   The family reports that he feels and looks much better today than he has in the past.   Objective: Vital signs in last 24 hours: Vitals:   07/04/20 0232 07/04/20 0324 07/04/20 0533 07/04/20 0823  BP: 137/85 135/83 (!) 142/88 137/85  Pulse: 80 80 85 80  Resp:  18  14  Temp: 97.7 F (36.5 C) 97.7 F (36.5 C) 97.7 F (36.5 C) 97.9 F (36.6 C)  TempSrc: Axillary Oral Oral Oral  SpO2: 98% 98% 100% 93%  Weight:      Height:       Weight change:   Intake/Output Summary (Last 24 hours) at 07/04/2020 1135 Last data filed at 07/04/2020 [id number] Gross per 24 hour  Intake 1339 ml  Output 0 ml  Net 1339 ml     Exam: General: Lying in bed without any distress   Lab Results: '@LABTEST2'$ @ Micro Results: Recent Results (from the past 240 hour(s))  Culture, blood (x 2)     Status: None   Collection Time: 06/24/20 10:00 PM   Specimen: BLOOD  Result Value Ref Range Status   Specimen Description BLOOD BLOOD LEFT HAND  Final   Special Requests   Final    BOTTLES DRAWN AEROBIC ONLY Blood Culture results may not be optimal due to an inadequate volume of blood received in culture bottles   Culture   Final    NO GROWTH 5 DAYS Performed at [facility], [address] [place], [place] [address]    Report Status 06/29/2020 FINAL  Final  Culture, blood (x 2)     Status: None    Collection Time: 06/24/20 10:01 PM   Specimen: BLOOD  Result Value Ref Range Status   Specimen Description BLOOD BLOOD RIGHT HAND  Final   Special Requests   Final    BOTTLES DRAWN AEROBIC ONLY Blood Culture results may not be optimal due to an inadequate volume of blood received in culture bottles   Culture   Final    NO GROWTH 5 DAYS Performed at [facility], [address] [place], [place] [address]    Report Status 06/29/2020 FINAL  Final  MRSA PCR Screening     Status: None   Collection Time: 06/25/20  6:27 PM   Specimen: Nasopharyngeal  Result Value Ref Range Status   MRSA by PCR NEGATIVE NEGATIVE Final    Comment:        The GeneXpert MRSA Assay (FDA approved for NASAL specimens only), is one component of a comprehensive MRSA colonization surveillance program. It is not intended to diagnose MRSA infection nor to guide or monitor treatment for MRSA infections. Performed at [facility], [address],  [place], [place] [address]   Blood culture (routine x 2)     Status: None   Collection Time: 07/16/2020  5:26 PM   Specimen: BLOOD  Result Value Ref Range Status   Specimen Description BLOOD RIGHT ANTECUBITAL  Final   Special Requests   Final    BOTTLES DRAWN AEROBIC AND ANAEROBIC Blood Culture results may not be optimal due to an inadequate volume of blood received in culture bottles   Culture   Final    NO GROWTH 5 DAYS Performed at [facility], [address] [place], [place] [address]    Report Status 07/03/2020 FINAL  Final  Blood culture (routine x 2)     Status: None   Collection Time: 07/07/2020  5:26 PM   Specimen: BLOOD  Result Value Ref Range Status   Specimen Description BLOOD LEFT ANTECUBITAL  Final   Special Requests   Final    BOTTLES DRAWN AEROBIC AND ANAEROBIC Blood Culture results may not be optimal due to an inadequate volume of blood received in culture bottles   Culture   Final    NO GROWTH 5 DAYS Performed at  [facility], [address] [place], [place] [address]    Report Status 07/03/2020 FINAL  Final  C Difficile Quick Screen w PCR reflex     Status: None   Collection Time: 06/30/20  5:08 PM   Specimen: STOOL  Result Value Ref Range Status   C Diff antigen NEGATIVE NEGATIVE Final   C Diff toxin NEGATIVE NEGATIVE Final   C Diff interpretation No C. difficile detected.  Final    Comment: Performed at [facility], [address] [place], [place] [address]   Studies/Results: US Venous Img Upper Uni Right(DVT)  Result Date: 07/02/2020 CLINICAL DATA:  75 year old male with right arm swelling. EXAM: RIGHT UPPER EXTREMITY VENOUS DOPPLER ULTRASOUND TECHNIQUE: Gray-scale sonography with graded compression, as well as color Doppler and duplex ultrasound were performed to evaluate the upper extremity deep venous system from the level of the subclavian vein and including the jugular, axillary, basilic, radial, ulnar and upper cephalic vein. Spectral Doppler was utilized to evaluate flow at rest and with distal augmentation maneuvers. COMPARISON:  None. FINDINGS: Contralateral Subclavian Vein: Not well visualized due to bandages from dialysis access. Internal Jugular Vein: Limited visualization. No evidence of thrombus. Normal compressibility, respiratory phasicity and response to augmentation. Subclavian Vein: No evidence of thrombus. Normal compressibility, respiratory phasicity and response to augmentation. Axillary Vein: Limited visualization.  No evidence of thrombus. Cephalic Vein: No evidence of thrombus. Normal compressibility, respiratory phasicity and response to augmentation. Basilic Vein: No evidence of thrombus. Normal compressibility, respiratory phasicity and response to augmentation. Brachial Veins: No evidence of thrombus. Normal compressibility, respiratory phasicity and response to augmentation. Radial Veins: No evidence of thrombus. Normal compressibility, respiratory  phasicity and response to augmentation. Ulnar Veins: No evidence of thrombus. Normal compressibility, respiratory phasicity and response to augmentation. Other Findings:  None visualized. IMPRESSION: No evidence of right upper extremity deep vein thrombosis. Limited visualization of the right internal jugular and right axillary veins. Ruthann Cancer, MD [facility]iology Electronically Signed   By: Ruthann Cancer MD   On: 07/02/2020 11:51   DG Chest Port 1 View  Result Date: 07/04/2020 CLINICAL DATA:  End stage renal disease with airspace consolidation EXAM: PORTABLE CHEST 1 VIEW COMPARISON:  July 01, 2020 FINDINGS: Dual lumen catheter position unchanged. Nasogastric tube tip and side port below the  diaphragm. No pneumothorax. Left lower lobe consolidation persists. There is a new small right pleural effusion. There is interstitial pulmonary edema throughout the lungs. Heart is mildly enlarged with pulmonary venous hypertension. No adenopathy. There is aortic atherosclerosis. No bone lesions. There are foci of calcification in the right carotid artery. IMPRESSION: Tube and catheter positions unchanged without pneumothorax. Persistent left lower lobe consolidation concerning for pneumonia. There is cardiomegaly with pulmonary vascular congestion. There is a right pleural effusion with pulmonary edema. Appearance is consistent with volume overload/degree of congestive heart failure. There is right carotid artery calcification. Aortic Atherosclerosis (ICD10-I70.0). Electronically Signed   By: Lowella Grip III M.D.   On: 07/04/2020 09:33   Medications: I have reviewed the patient's current medications. Scheduled Meds: . sodium chloride   Intravenous Once  . benzonatate  100 mg Oral Q8H  . carvedilol  6.25 mg Per Tube BID WC  . Chlorhexidine Gluconate Cloth  6 each Topical Daily  . feeding supplement (PROSource TF)  45 mL Per Tube BID  . fiber  1  packet Per Tube TID  . free water  30 mL Per Tube Q4H  . gentamicin cream  1 application Topical Daily  . Gerhardt's butt cream   Topical TID  . heparin  5,000 Units Subcutaneous Q8H  . hydrALAZINE  50 mg Oral Q8H  . insulin aspart  0-9 Units Subcutaneous Q4H  . isosorbide dinitrate  20 mg Oral TID  . LORazepam  1 mg Intravenous Once  . multivitamin  1 tablet Per Tube QHS  . pantoprazole (PROTONIX) IV  40 mg Intravenous Q12H  . saccharomyces boulardii  250 mg Per Tube BID  . sodium bicarbonate  1,300 mg Per Tube BID  . sodium chloride flush  3 mL Intravenous Q12H   Continuous Infusions: . dialysis solution 1.5% low-MG/low-CA    . feeding supplement (NEPRO CARB STEADY)    . piperacillin-tazobactam (ZOSYN)  IV 2.25 g (07/04/20 0555)   PRN Meds:.acetaminophen (TYLENOL) oral liquid 160 mg/5 mL, albuterol, dicyclomine, guaiFENesin-dextromethorphan, hydrALAZINE, loperamide, morphine injection, nitroGLYCERIN, ondansetron **OR** ondansetron (ZOFRAN) IV, phenol   Assessment: Active Problems:   ESRD (end stage renal disease) (HCC)   Chronic HFrEF (heart failure with reduced ejection fraction) (HCC)   Failure to thrive in adult   Non-ST elevation (NSTEMI) myocardial infarction [facility])    Plan: The patient is stable at this time without any sign of active GI bleeding.  The patient has multiple medical issues going on and is not a candidate for any endoscopic procedures at this time.  I have discussed that with the family and they agree with not proceeding with any GI intervention.  I will check on the patient's labs daily but see no reason to see the patient unless he decompensates from a GI point of view.   LOS: 6 days   Lewayne Bunting 07/04/2020, 11:35 AM Pager [phone] 7am-5pm  Check AMION for 5pm -7am coverage and on weekends

## 2020-07-05 ENCOUNTER — Inpatient Hospital Stay: Payer: Medicare Other

## 2020-07-05 DIAGNOSIS — R627 Adult failure to thrive: Secondary | ICD-10-CM | POA: Diagnosis not present

## 2020-07-05 DIAGNOSIS — I214 Non-ST elevation (NSTEMI) myocardial infarction: Secondary | ICD-10-CM | POA: Diagnosis not present

## 2020-07-05 DIAGNOSIS — J189 Pneumonia, unspecified organism: Secondary | ICD-10-CM | POA: Diagnosis not present

## 2020-07-05 DIAGNOSIS — U071 COVID-19: Secondary | ICD-10-CM | POA: Diagnosis not present

## 2020-07-05 LAB — TYPE AND SCREEN
ABO/RH(D): A POS
Antibody Screen: NEGATIVE
Unit division: 0

## 2020-07-05 LAB — BPAM RBC
Blood Product Expiration Date: 202203112359
ISSUE DATE / TIME: 202202120157
Unit Type and Rh: 6200

## 2020-07-05 MED ORDER — GUAIFENESIN 100 MG/5ML PO SOLN
10.0000 mL | ORAL | Status: DC | PRN
  Filled 2020-07-05 (×2): qty 10

## 2020-07-05 MED ORDER — FUROSEMIDE 10 MG/ML IJ SOLN
120.0000 mg | Freq: Once | INTRAVENOUS | Status: DC
  Filled 2020-07-05: qty 12

## 2020-07-06 ENCOUNTER — Telehealth: Payer: Self-pay | Admitting: Cardiovascular Disease

## 2020-07-06 NOTE — Telephone Encounter (Signed)
Patient has expired 

## 2020-07-21 NOTE — Progress Notes (Signed)
  Chaplain On-Call received page from [employer] who reported the death of the patient and that family members are present.  Chaplain was assisted by RN Dorothea Ogle to wear the necessary PPE to enter the room, and then met five family members at bedside.  Chaplain provided spiritual and emotional support and prayer. Family members stated their appreciation.  Seymour Macintyre Alexa M.Div., [facility]

## 2020-07-21 NOTE — Progress Notes (Signed)
Called to NTS patient. Patient on 3 liters nasal cannula with loud audible secretions. Pre-post 100% with NRB mask. Suctioned x2 for an unbelievable amount of secretions. Very large mucous plug second time. HR 87 post suction. Saturation 100% patient tolerated interventions well. Daughter at bedside

## 2020-07-21 NOTE — Progress Notes (Addendum)
Pt having increased work of breathing, RT & NP paged to come to bedside to assess and for repeat suction. Tube feed on hold. CXR ordered. No changes from previous. Ordered '120mg'$  IV lasix. Also daughter wanted to page Dr. Juleen China, nephrologist on call. MD responded and given update on pt status and plan. MD called daughter after to explain next steps. Shortly after, MD called this nurse to confirm communication and verbally order ABG. While on the phone with Dr. Juleen China, family of patient came in hallway to notify staff to come in the room immediately. Upon entering room, pt was expiring with HR in 20s. This nurse w/ B. Randol Kern, NP at the bedside to confirm TOD.

## 2020-07-21 NOTE — Progress Notes (Addendum)
Pt having trouble breathing, provider paged and ordered increased dose of guafenisin and naso/trachea suction. RT came to bedside, suction successful, will continue to monitor.

## 2020-07-21 NOTE — Death Summary Note (Signed)
Death Summary  Scott Oconnor [id number] DOB: 01-24-1946 DOA: Jul 02, 2020  PCP: [name], MD   Admit date: July 02, 2020 Date of Death: 07/09/20  Final Diagnoses:  Active Problems:   ESRD (end stage renal disease) (HCC)   Chronic HFrEF (heart failure with reduced ejection fraction) (HCC)   Failure to thrive in adult   Non-ST elevation (NSTEMI) myocardial infarction [facility])   Anorexia     History of present illness:  HPI was taken from Dr. Marcello Moores: Scott Oconnor is a 75 y.o. male with medical history significant ofESRD on dialysis- was doing PD at home, now doing HD, anemia of CKD, HTN, CHF, PVD  raynaud's syndrome, hyperlipidemia was admitted on 1/27/2022for acute gi bleed, and COVID-19 pneumonia. Course further complicated by bacterial pna, sepsis,uremia and metabolic encephalopathy Patient was treated with solumedrol with plans for continued taper at home for his diagnosis of COVID-19,he was not given remdesivir due to family preference. He was treated with zosyn with plans for completion of course with Augmentin at home for his diagnosis of lobar pna. In reference to his GI bleed he was evaluated by gi who recommended conservative management, he wasgiven 3 unit of PRBC during his hospitalization.  Of note medical team recommended further inpatient stay with transition to rehab due to patient significant weakness, and poor po intake, however family and patient opted for discharge home. Patient was discharged on 06/27/2020. Patient now returns to ED one day later due to family noting continued weakness and continued poor po intake since discharge. Per daughter home health nurse recommended return to ED for evaluation base on patient status at home.Of note patient also was seen by palliative care as inpatient and patient remains a full code with full scope of care.Per daughter patient complaints of diffuse body aches, as well as nausea. Patient also states he has abdominal pain. Per  daughter patient has had 2 bm since being home both w/o black stools, or brb. He notes no sob. But continues per daughter to be very weak and lethargic. ED Course:  Vitals  Afeb, bp 190/78   Sat 97% on ra, HR 575 ECG:nsr, no hyperacute st -twave changes Labs: Wbc:10.8, hgb 10.7 up from prior of 7.2,plt 128, cr 16.4 down from prior of 27 NA136, K5.2,  Cr 6.8 around baseline Lactic 1.4 Cxr: 1. Similar diffuse bilateral interstitial prominence consistent with interstitial edema, infection or pneumonitis. Decreased small left pleural effusion and left basilar opacification, likely atelectasis. 2. Pneumoperitoneum again visualized. tx [facility] course from Dr. Jimmye Norman 2/9-08/02/2021: Pt presented w/ severe sepsis secondary to [place] and bacterial pneumonia. Pt completed remdesivir course while inpatient and also received IV zosyn, steroids, bronchodilators & supplemental oxygen. Pt was also found to have a likely GI bleed of unknown etiology as pt was not a candidate for EGD/colonoscopy. Pt did receive 1 unit of pRBCs. Furthermore, pt was found to have NSTEMI that was treated medically as well acute on chronic combined CHF exacerbation. Pt also had failure to thrive and poor po intake. Pt was placed on tube feeds via NG tube. There was a concern that the pt was aspirating so tube feeds were held. Despite multiple treatments to address all above and below stated problems, pt passed away on July 09, 2020 at 0326. Pt was DNR/DNI. For more information, please see previous progress/consult notes.     Hospital Course:     NSTEMI: troponins elevated but trending down. Continue on coreg, hydralazine & isosorbide dinitrate. D/c IV heparin. Echo shows EF 20-25%,  grade II diastolic dysfunction & global hypokinesis. Continue on tele. Cardio following and recs apprec  Severe sepsis: secondary to COVID-19 & bacterial pneumonia. Completed remdesivir & steroid course. Continue on IV zosyn x 7  days. Pro-cal 8.03. Encourage incentive spirometry   Recent GI bleed: eliquis has been d/c. S/p 1 unit of pRBCs transfused. Repeat H&H are trending up. Will transfuse if Hb <7.0. Fecal occult positive. Not currently an EGD/colonoscopy candidate. GI following and recs apprec   Metabolic encephalopathy: continue w/ supportive care. Labile.   ESRD: on PD at home. Continue on dialysis as per nephro   Failure to thrive: w/ poor po intake. Unchanged from day prior. Has been declining since 1/20 as per pt's daughter. Continue w/ tube feeds   Hypokalemia: will be managed w/ dialysis as per nephro   Hyperglycemia: no hx of DM, likely secondary to tube feeds & steroids. Completed steroid course 07/03/20. Continue on SSI w/ accuchecks   Thrombocytopenia: etiology unclear, possibly secondary to [place]. Will continue to monitor   ACD: likely secondary to ESRD & GI bleed. S/p 1 unit of pRBCs transfused. Repeat H&H are trending up   Underweight: BMI 17.5. Continue w/ tube feeds    Time of death 0326 07/31/20.  Time spent: > 30 mins  Signed:  Wyvonnia Dusky  [facility] July 31, 2020, 7:17 AM

## 2020-07-21 NOTE — Progress Notes (Signed)
Cross Cover Patient with increasing respiratory distress throughout shift.  Earlier in shift distress partially alleviated with NT suctioning.  Concern for aspiration from tube feeds Tube feeds were put on hold. Chest xray with ongoing congestive heart failure and bedside exam with audible crackles, increased work of breathing.  PD still ongoing.  Patient still occasionally voids and were utilizing torsemide at home.   Treated 120 mg lasix and bipap Unfortunately this was not effective. He passed away., Death pronounced by 2 RNs ant bedside [phone] 07/20/20, and confirmed by me. Family at bedside.  Dr. Juleen China aware and at bedside for family support

## 2020-07-21 DEATH — deceased

## 2020-07-23 ENCOUNTER — Ambulatory Visit: Payer: Medicare Other | Admitting: Family

## 2021-11-21 IMAGING — CT CT HEAD W/O CM
2 of 6 series · 14 of 47 positions shown, 17 images · non-contrast
Comparison: 06/23/2020

CLINICAL DATA: Altered mental status.  CNS infection suspected.

EXAM:
CT HEAD WITHOUT CONTRAST
TECHNIQUE: Contiguous axial images were obtained from the base of the skull
through the vertex without intravenous contrast.

[Series 3: head wo · axial · 0.47mm/px · z∈[+569,+714]mm · 11 of 33 slices shown, 14 images]
[im 2/33  brain]
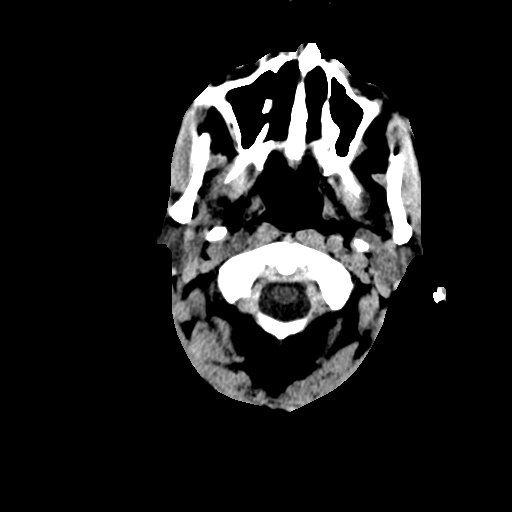
[im 2/33  bone]
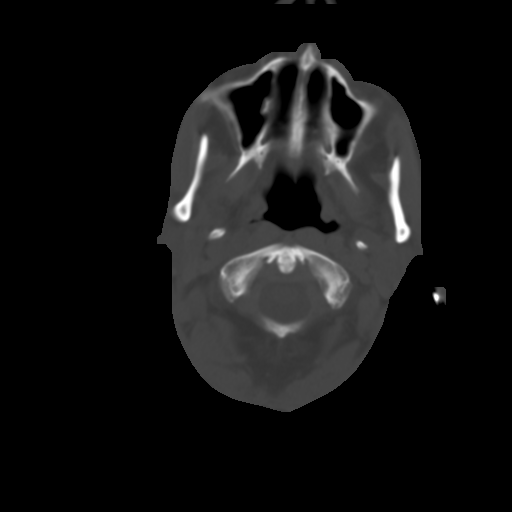
[im 5/33  brain]
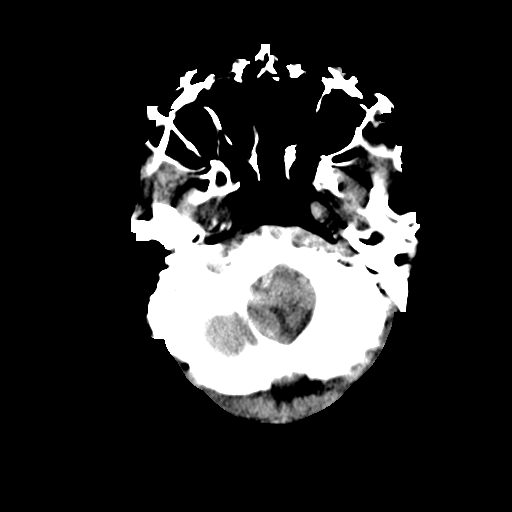
[im 9/33  brain]
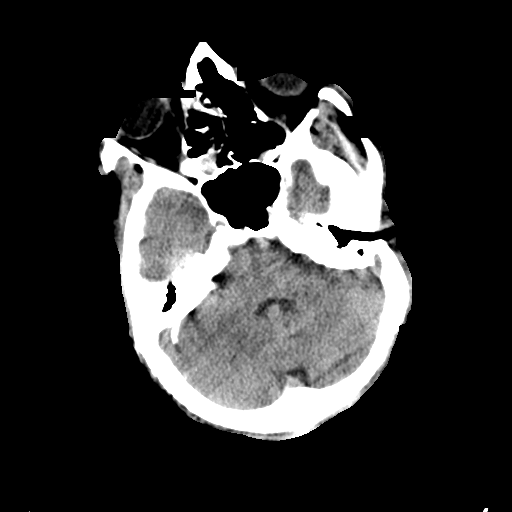
[im 12/33  brain]
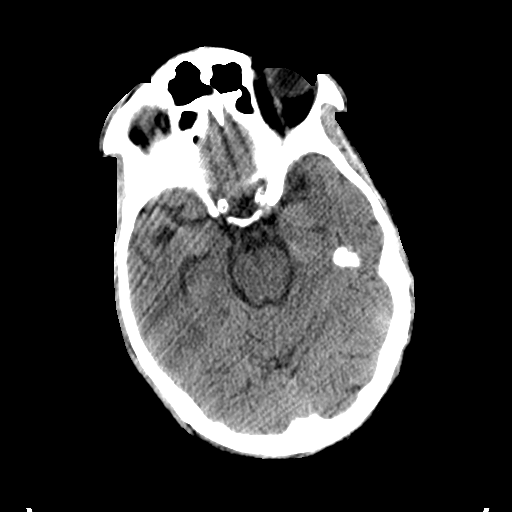
[im 13/33  brain]
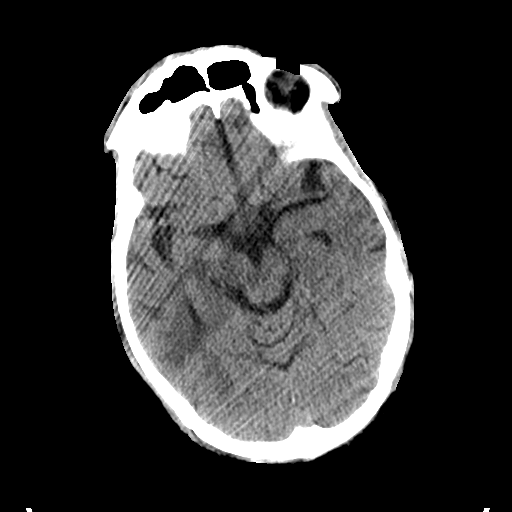
[im 13/33  bone]
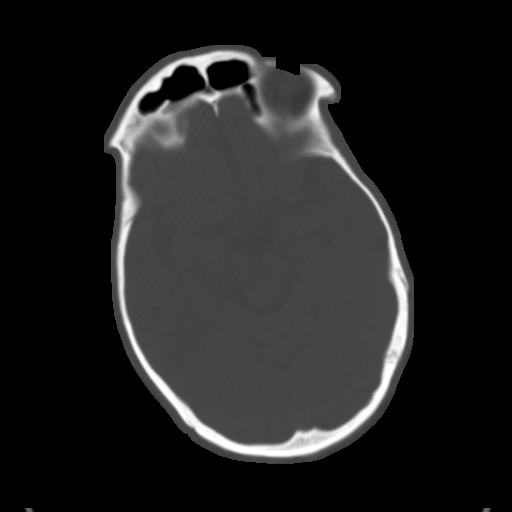
[im 17/33  brain]
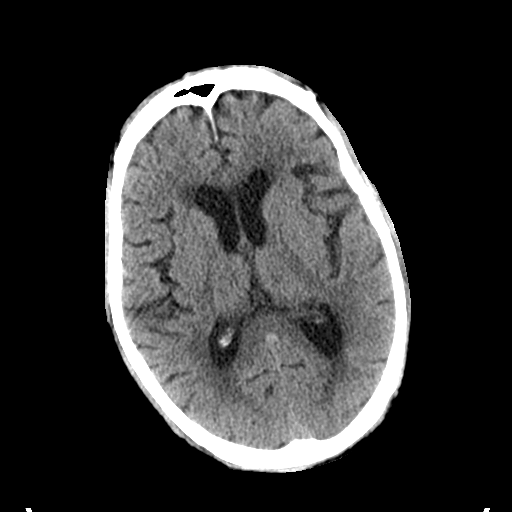
[im 20/33  brain]
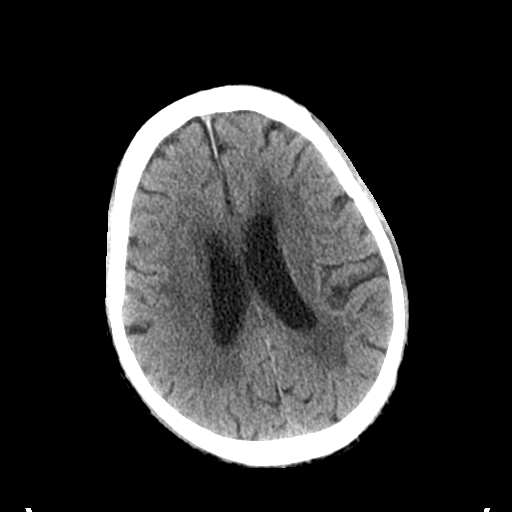
[im 21/33  brain]
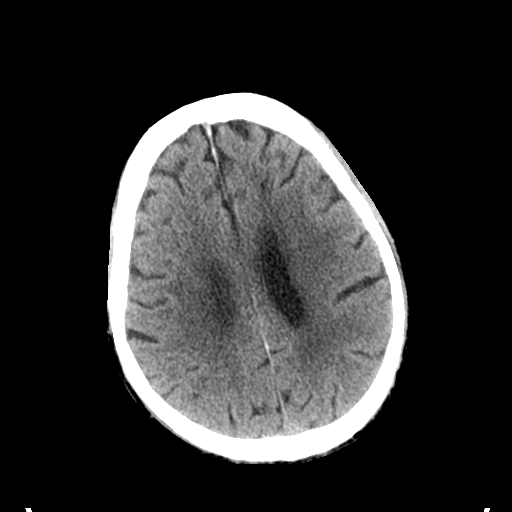
[im 25/33  brain]
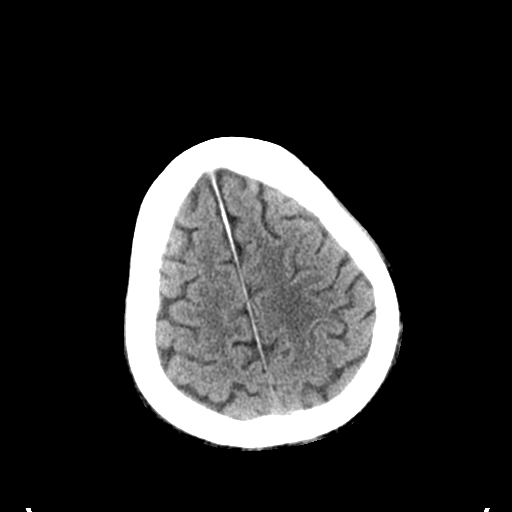
[im 25/33  bone]
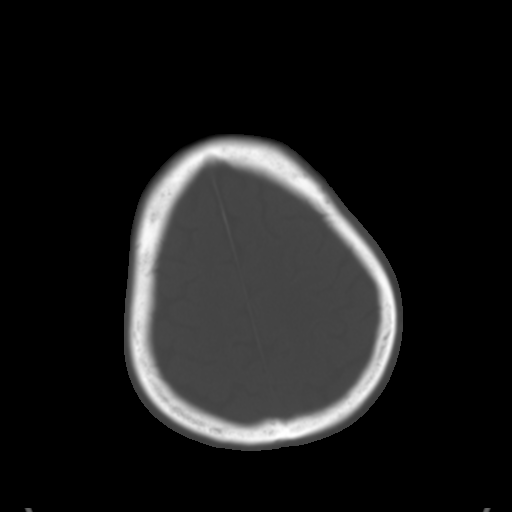
[im 28/33  brain]
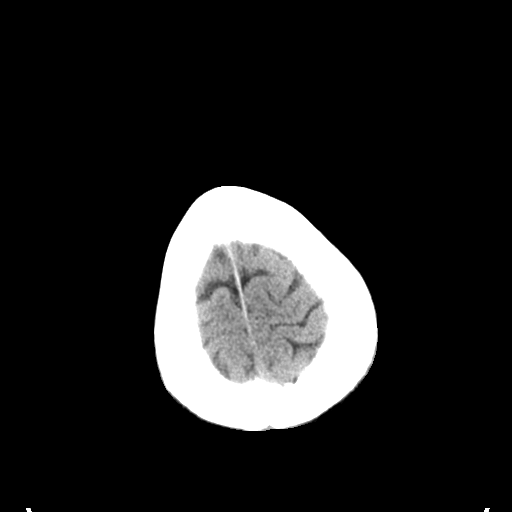
[im 31/33  brain]
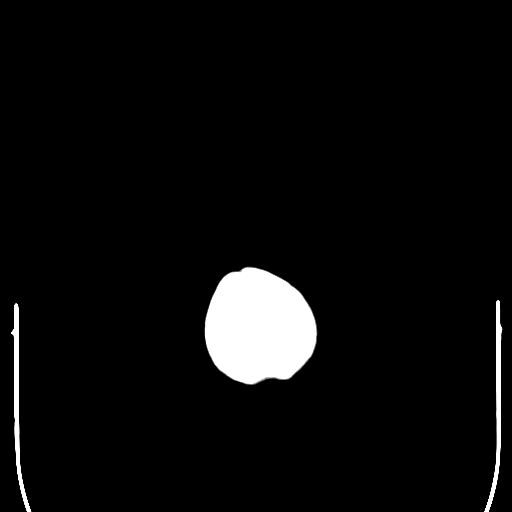

[Series 8: coronal soft tissue · coronal · 0.17mm/px · 3 of 76 slices shown]
[im 32/76  brain]
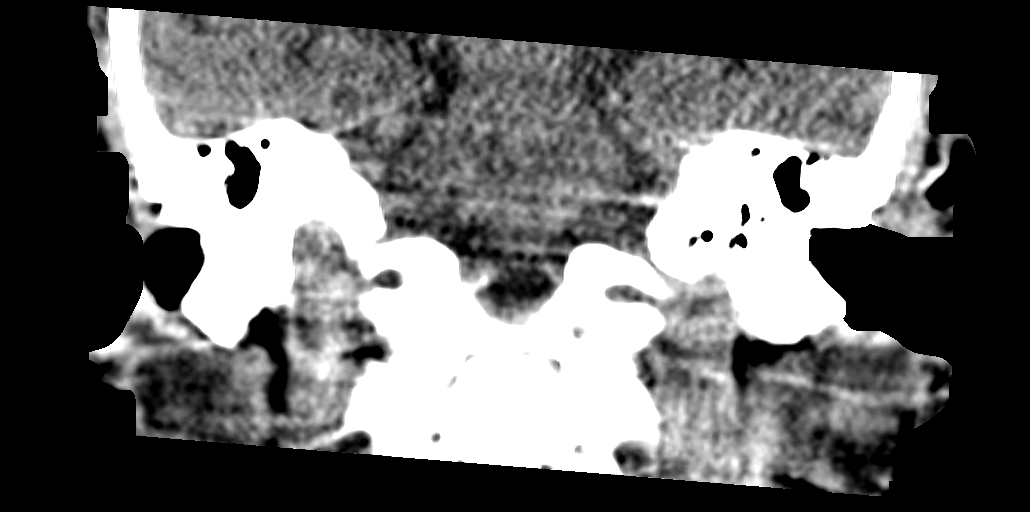
[im 43/76  brain]
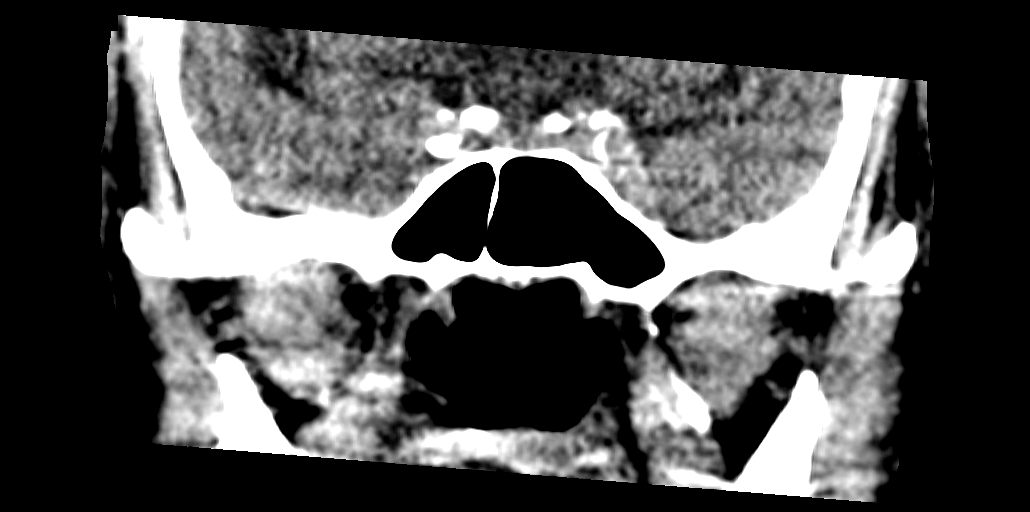
[im 54/76  brain]
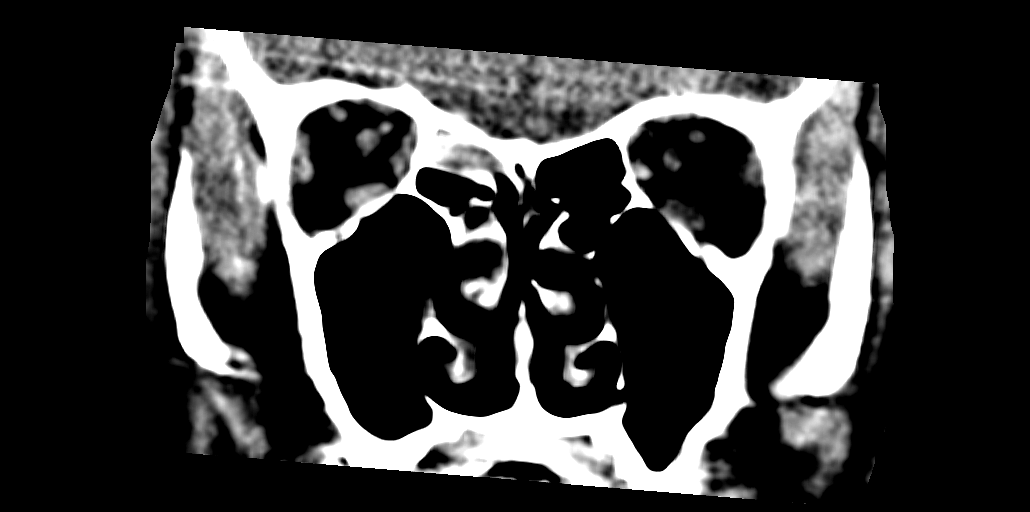

[14 of 47 positions shown; findings below may reference images not displayed]

FINDINGS: Brain: The study suffers from motion degradation. Age related
atrophy. Chronic small-vessel ischemic changes of the hemispheric
white matter. Old lacunar infarction left basal ganglia. No sign of
acute infarction, mass lesion, hemorrhage, hydrocephalus or
extra-axial collection.

Vascular: There is atherosclerotic calcification of the major
vessels at the base of the brain.

Skull: Negative

Sinuses/Orbits: Clear except for mild mucosal disease in the ethmoid
sinuses right more than left. Orbits negative.

Other: None
IMPRESSION: Motion degraded study. No acute finding by CT. Chronic small-vessel
ischemic changes of the white matter. Old lacunar infarction left
basal ganglia.
